# Patient Record
Sex: Male | Born: 1945
Health system: Southern US, Community
[De-identification: ages and names within clinical notes are randomized; demographics above are authoritative.]

## PROBLEM LIST (undated history)

## (undated) DIAGNOSIS — Z87891 Personal history of nicotine dependence: Secondary | ICD-10-CM

## (undated) DIAGNOSIS — H269 Unspecified cataract: Secondary | ICD-10-CM

## (undated) DIAGNOSIS — C9110 Chronic lymphocytic leukemia of B-cell type not having achieved remission: Secondary | ICD-10-CM

## (undated) DIAGNOSIS — I499 Cardiac arrhythmia, unspecified: Secondary | ICD-10-CM

## (undated) DIAGNOSIS — G4730 Sleep apnea, unspecified: Secondary | ICD-10-CM

## (undated) DIAGNOSIS — I471 Supraventricular tachycardia: Secondary | ICD-10-CM

## (undated) DIAGNOSIS — I319 Disease of pericardium, unspecified: Secondary | ICD-10-CM

## (undated) DIAGNOSIS — I2510 Atherosclerotic heart disease of native coronary artery without angina pectoris: Secondary | ICD-10-CM

## (undated) DIAGNOSIS — H409 Unspecified glaucoma: Secondary | ICD-10-CM

## (undated) DIAGNOSIS — I4719 Other supraventricular tachycardia: Secondary | ICD-10-CM

## (undated) DIAGNOSIS — R059 Cough, unspecified: Secondary | ICD-10-CM

## (undated) DIAGNOSIS — R05 Cough: Secondary | ICD-10-CM

## (undated) DIAGNOSIS — I255 Ischemic cardiomyopathy: Secondary | ICD-10-CM

## (undated) DIAGNOSIS — I10 Essential (primary) hypertension: Secondary | ICD-10-CM

## (undated) DIAGNOSIS — I219 Acute myocardial infarction, unspecified: Secondary | ICD-10-CM

## (undated) DIAGNOSIS — E7800 Pure hypercholesterolemia, unspecified: Secondary | ICD-10-CM

## (undated) DIAGNOSIS — I5022 Chronic systolic (congestive) heart failure: Secondary | ICD-10-CM

## (undated) DIAGNOSIS — R0600 Dyspnea, unspecified: Secondary | ICD-10-CM

## (undated) HISTORY — DX: Essential (primary) hypertension: I10

## (undated) HISTORY — DX: Chronic lymphocytic leukemia of B-cell type not having achieved remission: C91.10

## (undated) HISTORY — PX: CATARACT EXTRACTION: SUR2

## (undated) HISTORY — DX: Pure hypercholesterolemia, unspecified: E78.00

## (undated) HISTORY — PX: US ECHOCARDIOGRAPHY: HXRAD669

## (undated) HISTORY — DX: Cough: R05

## (undated) HISTORY — DX: Cough, unspecified: R05.9

## (undated) HISTORY — DX: Sleep apnea, unspecified: G47.30

---

## 1989-01-21 DIAGNOSIS — H409 Unspecified glaucoma: Secondary | ICD-10-CM

## 1989-01-21 HISTORY — DX: Unspecified glaucoma: H40.9

## 1992-01-22 HISTORY — PX: CORONARY ARTERY BYPASS GRAFT: SHX141

## 2002-02-18 ENCOUNTER — Encounter: Payer: Self-pay | Admitting: Family Medicine

## 2002-02-18 ENCOUNTER — Ambulatory Visit (HOSPITAL_COMMUNITY): Admission: RE | Admit: 2002-02-18 | Discharge: 2002-02-18 | Payer: Self-pay | Admitting: Family Medicine

## 2003-10-26 ENCOUNTER — Encounter: Admission: RE | Admit: 2003-10-26 | Discharge: 2003-10-26 | Payer: Self-pay | Admitting: General Surgery

## 2004-02-10 ENCOUNTER — Ambulatory Visit (HOSPITAL_COMMUNITY): Admission: RE | Admit: 2004-02-10 | Discharge: 2004-02-10 | Payer: Self-pay | Admitting: Gastroenterology

## 2009-03-08 ENCOUNTER — Emergency Department (HOSPITAL_BASED_OUTPATIENT_CLINIC_OR_DEPARTMENT_OTHER): Admission: EM | Admit: 2009-03-08 | Discharge: 2009-03-08 | Payer: Self-pay | Admitting: Emergency Medicine

## 2009-07-31 ENCOUNTER — Other Ambulatory Visit: Admission: RE | Admit: 2009-07-31 | Discharge: 2009-07-31 | Payer: Self-pay | Admitting: Oncology

## 2009-07-31 ENCOUNTER — Ambulatory Visit: Payer: Self-pay | Admitting: Oncology

## 2009-07-31 LAB — CBC WITH DIFFERENTIAL/PLATELET
BASO%: 0.3 % (ref 0.0–2.0)
Basophils Absolute: 0 10*3/uL (ref 0.0–0.1)
EOS%: 0.9 % (ref 0.0–7.0)
Eosinophils Absolute: 0.1 10*3/uL (ref 0.0–0.5)
HCT: 44.5 % (ref 38.4–49.9)
HGB: 14.6 g/dL (ref 13.0–17.1)
LYMPH%: 62.2 % — ABNORMAL HIGH (ref 14.0–49.0)
MCH: 28.3 pg (ref 27.2–33.4)
MCHC: 32.8 g/dL (ref 32.0–36.0)
MCV: 86.5 fL (ref 79.3–98.0)
MONO#: 0.8 10*3/uL (ref 0.1–0.9)
MONO%: 4.6 % (ref 0.0–14.0)
NEUT#: 5.4 10*3/uL (ref 1.5–6.5)
NEUT%: 32 % — ABNORMAL LOW (ref 39.0–75.0)
Platelets: 130 10*3/uL — ABNORMAL LOW (ref 140–400)
RBC: 5.15 10*6/uL (ref 4.20–5.82)
RDW: 14.7 % — ABNORMAL HIGH (ref 11.0–14.6)
WBC: 16.9 10*3/uL — ABNORMAL HIGH (ref 4.0–10.3)
lymph#: 10.5 10*3/uL — ABNORMAL HIGH (ref 0.9–3.3)

## 2009-07-31 LAB — MORPHOLOGY: PLT EST: DECREASED

## 2009-08-01 LAB — COMPREHENSIVE METABOLIC PANEL
ALT: 46 U/L (ref 0–53)
AST: 32 U/L (ref 0–37)
Albumin: 4.5 g/dL (ref 3.5–5.2)
Alkaline Phosphatase: 42 U/L (ref 39–117)
BUN: 12 mg/dL (ref 6–23)
CO2: 25 mEq/L (ref 19–32)
Calcium: 9.2 mg/dL (ref 8.4–10.5)
Chloride: 106 mEq/L (ref 96–112)
Creatinine, Ser: 0.84 mg/dL (ref 0.40–1.50)
Glucose, Bld: 105 mg/dL — ABNORMAL HIGH (ref 70–99)
Potassium: 4.5 mEq/L (ref 3.5–5.3)
Sodium: 141 mEq/L (ref 135–145)
Total Bilirubin: 0.8 mg/dL (ref 0.3–1.2)
Total Protein: 6.6 g/dL (ref 6.0–8.3)

## 2009-08-01 LAB — DIRECT ANTIGLOBULIN TEST (NOT AT ARMC)
DAT (Complement): NEGATIVE
DAT IgG: NEGATIVE

## 2009-08-01 LAB — IGG, IGA, IGM
IgA: 102 mg/dL (ref 68–378)
IgG (Immunoglobin G), Serum: 713 mg/dL (ref 694–1618)
IgM, Serum: 50 mg/dL — ABNORMAL LOW (ref 60–263)

## 2009-08-01 LAB — LACTATE DEHYDROGENASE: LDH: 172 U/L (ref 94–250)

## 2009-08-02 LAB — FLOW CYTOMETRY

## 2009-08-02 LAB — ZAP-70

## 2009-08-16 ENCOUNTER — Ambulatory Visit (HOSPITAL_COMMUNITY): Admission: RE | Admit: 2009-08-16 | Discharge: 2009-08-16 | Payer: Self-pay | Admitting: Oncology

## 2009-09-05 ENCOUNTER — Ambulatory Visit: Payer: Self-pay | Admitting: Cardiology

## 2009-09-07 ENCOUNTER — Ambulatory Visit: Payer: Self-pay | Admitting: Cardiology

## 2009-09-07 ENCOUNTER — Ambulatory Visit (HOSPITAL_COMMUNITY): Admission: RE | Admit: 2009-09-07 | Discharge: 2009-09-07 | Payer: Self-pay | Admitting: Cardiology

## 2009-09-08 ENCOUNTER — Ambulatory Visit: Payer: Self-pay | Admitting: Oncology

## 2009-09-11 LAB — CBC WITH DIFFERENTIAL/PLATELET
BASO%: 0.4 % (ref 0.0–2.0)
Basophils Absolute: 0.1 10*3/uL (ref 0.0–0.1)
EOS%: 0.8 % (ref 0.0–7.0)
Eosinophils Absolute: 0.1 10*3/uL (ref 0.0–0.5)
HCT: 44.7 % (ref 38.4–49.9)
HGB: 14.9 g/dL (ref 13.0–17.1)
LYMPH%: 72.8 % — ABNORMAL HIGH (ref 14.0–49.0)
MCH: 28.7 pg (ref 27.2–33.4)
MCHC: 33.4 g/dL (ref 32.0–36.0)
MCV: 86 fL (ref 79.3–98.0)
MONO#: 0.7 10*3/uL (ref 0.1–0.9)
MONO%: 3.8 % (ref 0.0–14.0)
NEUT#: 4.2 10*3/uL (ref 1.5–6.5)
NEUT%: 22.2 % — ABNORMAL LOW (ref 39.0–75.0)
Platelets: 96 10*3/uL — ABNORMAL LOW (ref 140–400)
RBC: 5.19 10*6/uL (ref 4.20–5.82)
RDW: 14.4 % (ref 11.0–14.6)
WBC: 18.8 10*3/uL — ABNORMAL HIGH (ref 4.0–10.3)
lymph#: 13.7 10*3/uL — ABNORMAL HIGH (ref 0.9–3.3)

## 2009-10-05 ENCOUNTER — Ambulatory Visit: Payer: Self-pay | Admitting: Cardiology

## 2009-10-05 ENCOUNTER — Encounter: Admission: RE | Admit: 2009-10-05 | Discharge: 2009-10-05 | Payer: Self-pay | Admitting: Cardiology

## 2009-10-18 ENCOUNTER — Ambulatory Visit: Payer: Self-pay | Admitting: Cardiology

## 2009-10-19 ENCOUNTER — Ambulatory Visit (HOSPITAL_COMMUNITY)
Admission: RE | Admit: 2009-10-19 | Discharge: 2009-10-19 | Payer: Self-pay | Source: Home / Self Care | Admitting: Cardiology

## 2009-11-02 ENCOUNTER — Ambulatory Visit: Payer: Self-pay | Admitting: Cardiology

## 2009-11-16 ENCOUNTER — Ambulatory Visit: Payer: Self-pay | Admitting: Oncology

## 2009-11-20 LAB — CBC WITH DIFFERENTIAL/PLATELET
BASO%: 0.1 % (ref 0.0–2.0)
Basophils Absolute: 0 10*3/uL (ref 0.0–0.1)
EOS%: 0.7 % (ref 0.0–7.0)
Eosinophils Absolute: 0.1 10*3/uL (ref 0.0–0.5)
HCT: 45.2 % (ref 38.4–49.9)
HGB: 14.5 g/dL (ref 13.0–17.1)
LYMPH%: 73.5 % — ABNORMAL HIGH (ref 14.0–49.0)
MCH: 28.3 pg (ref 27.2–33.4)
MCHC: 32.1 g/dL (ref 32.0–36.0)
MCV: 88.1 fL (ref 79.3–98.0)
MONO#: 0.8 10*3/uL (ref 0.1–0.9)
MONO%: 4.3 % (ref 0.0–14.0)
NEUT#: 4 10*3/uL (ref 1.5–6.5)
NEUT%: 21.4 % — ABNORMAL LOW (ref 39.0–75.0)
Platelets: 104 10*3/uL — ABNORMAL LOW (ref 140–400)
RBC: 5.13 10*6/uL (ref 4.20–5.82)
RDW: 13.6 % (ref 11.0–14.6)
WBC: 18.7 10*3/uL — ABNORMAL HIGH (ref 4.0–10.3)
lymph#: 13.8 10*3/uL — ABNORMAL HIGH (ref 0.9–3.3)

## 2009-11-20 LAB — TECHNOLOGIST REVIEW

## 2009-11-21 LAB — COMPREHENSIVE METABOLIC PANEL
ALT: 38 U/L (ref 0–53)
AST: 30 U/L (ref 0–37)
Albumin: 4.1 g/dL (ref 3.5–5.2)
Alkaline Phosphatase: 43 U/L (ref 39–117)
BUN: 14 mg/dL (ref 6–23)
CO2: 26 mEq/L (ref 19–32)
Calcium: 8.9 mg/dL (ref 8.4–10.5)
Chloride: 107 mEq/L (ref 96–112)
Creatinine, Ser: 0.81 mg/dL (ref 0.40–1.50)
Glucose, Bld: 169 mg/dL — ABNORMAL HIGH (ref 70–99)
Potassium: 4.3 mEq/L (ref 3.5–5.3)
Sodium: 140 mEq/L (ref 135–145)
Total Bilirubin: 0.7 mg/dL (ref 0.3–1.2)
Total Protein: 5.9 g/dL — ABNORMAL LOW (ref 6.0–8.3)

## 2009-11-21 LAB — IGG, IGA, IGM
IgA: 100 mg/dL (ref 68–378)
IgG (Immunoglobin G), Serum: 771 mg/dL (ref 694–1618)
IgM, Serum: 39 mg/dL — ABNORMAL LOW (ref 60–263)

## 2009-11-21 LAB — DIRECT ANTIGLOBULIN TEST (NOT AT ARMC)
DAT (Complement): NEGATIVE
DAT IgG: NEGATIVE

## 2010-02-06 ENCOUNTER — Ambulatory Visit: Payer: Self-pay | Admitting: Cardiology

## 2010-04-24 ENCOUNTER — Other Ambulatory Visit: Payer: Self-pay | Admitting: Oncology

## 2010-04-24 ENCOUNTER — Encounter (HOSPITAL_BASED_OUTPATIENT_CLINIC_OR_DEPARTMENT_OTHER): Payer: Medicare Other | Admitting: Oncology

## 2010-04-24 DIAGNOSIS — C9110 Chronic lymphocytic leukemia of B-cell type not having achieved remission: Secondary | ICD-10-CM

## 2010-04-24 DIAGNOSIS — D696 Thrombocytopenia, unspecified: Secondary | ICD-10-CM

## 2010-04-24 DIAGNOSIS — N390 Urinary tract infection, site not specified: Secondary | ICD-10-CM

## 2010-04-24 DIAGNOSIS — Z79899 Other long term (current) drug therapy: Secondary | ICD-10-CM

## 2010-04-24 LAB — CBC WITH DIFFERENTIAL/PLATELET
BASO%: 0.2 % (ref 0.0–2.0)
Basophils Absolute: 0 10*3/uL (ref 0.0–0.1)
EOS%: 0.8 % (ref 0.0–7.0)
Eosinophils Absolute: 0.1 10*3/uL (ref 0.0–0.5)
HCT: 42.4 % (ref 38.4–49.9)
HGB: 14 g/dL (ref 13.0–17.1)
LYMPH%: 76.9 % — ABNORMAL HIGH (ref 14.0–49.0)
MCH: 28.5 pg (ref 27.2–33.4)
MCHC: 32.9 g/dL (ref 32.0–36.0)
MCV: 86.5 fL (ref 79.3–98.0)
MONO#: 0.6 10*3/uL (ref 0.1–0.9)
MONO%: 3.5 % (ref 0.0–14.0)
NEUT#: 2.9 10*3/uL (ref 1.5–6.5)
NEUT%: 18.6 % — ABNORMAL LOW (ref 39.0–75.0)
Platelets: 120 10*3/uL — ABNORMAL LOW (ref 140–400)
RBC: 4.9 10*6/uL (ref 4.20–5.82)
RDW: 14.1 % (ref 11.0–14.6)
WBC: 15.9 10*3/uL — ABNORMAL HIGH (ref 4.0–10.3)
lymph#: 12.2 10*3/uL — ABNORMAL HIGH (ref 0.9–3.3)

## 2010-04-24 LAB — COMPREHENSIVE METABOLIC PANEL
ALT: 54 U/L — ABNORMAL HIGH (ref 0–53)
AST: 38 U/L — ABNORMAL HIGH (ref 0–37)
Albumin: 4.2 g/dL (ref 3.5–5.2)
Alkaline Phosphatase: 42 U/L (ref 39–117)
BUN: 14 mg/dL (ref 6–23)
CO2: 27 mEq/L (ref 19–32)
Calcium: 9.4 mg/dL (ref 8.4–10.5)
Chloride: 107 mEq/L (ref 96–112)
Creatinine, Ser: 0.87 mg/dL (ref 0.40–1.50)
Glucose, Bld: 103 mg/dL — ABNORMAL HIGH (ref 70–99)
Potassium: 4.2 mEq/L (ref 3.5–5.3)
Sodium: 142 mEq/L (ref 135–145)
Total Bilirubin: 0.9 mg/dL (ref 0.3–1.2)
Total Protein: 6.7 g/dL (ref 6.0–8.3)

## 2010-04-24 LAB — URINALYSIS, MICROSCOPIC - CHCC
Bilirubin (Urine): NEGATIVE
Blood: NEGATIVE
Glucose: NEGATIVE g/dL
Ketones: NEGATIVE mg/dL
Leukocyte Esterase: NEGATIVE
Nitrite: NEGATIVE
Protein: NEGATIVE mg/dL
RBC count: NEGATIVE (ref 0–2)
Specific Gravity, Urine: 1.015 (ref 1.003–1.035)
pH: 6 (ref 4.6–8.0)

## 2010-04-24 LAB — URIC ACID: Uric Acid, Serum: 6.8 mg/dL (ref 4.0–7.8)

## 2010-04-26 LAB — URINE CULTURE

## 2010-05-02 ENCOUNTER — Other Ambulatory Visit: Payer: Self-pay | Admitting: Cardiology

## 2010-05-02 ENCOUNTER — Other Ambulatory Visit: Payer: Self-pay | Admitting: *Deleted

## 2010-05-02 DIAGNOSIS — I10 Essential (primary) hypertension: Secondary | ICD-10-CM

## 2010-05-02 MED ORDER — LOSARTAN POTASSIUM 50 MG PO TABS: 50.0000 mg | ORAL_TABLET | Freq: Every day | ORAL | Status: DC

## 2010-06-08 NOTE — Op Note (Signed)
NAME:  Rodney Burns, Rodney Burns NO.:  [id number]   MEDICAL RECORD NO.:  [id number]          PATIENT TYPE:  AMB   LOCATION:  ENDO                         FACILITY:  [facility]   PHYSICIAN:  Graylin Shiver, M.D.   DATE OF BIRTH:  [birth year]   DATE OF PROCEDURE:  02/10/2004  DATE OF DISCHARGE:                                 OPERATIVE REPORT   .   INDICATIONS:  Screening.   Informed consent was obtained after explanation of the risks of bleeding,  infection and perforation.   PREMEDICATION:  Fentanyl 60 mcg IV, Versed 8 mg IV.   PROCEDURE:  With the patient in the left lateral decubitus position, a  rectal exam was performed. No masses were felt. The Olympus colonoscope was  inserted into the rectum and advanced around the colon to the cecum. Cecal  landmarks were identified.  The cecum and ascending colon were normal. The  transverse colon normal. The descending colon, sigmoid and rectum were  normal.  He tolerated the procedure well without complications.   IMPRESSION:  Normal colonoscopy to the cecum.   I would recommend a follow-up colonoscopy again in 10 years.       SFG/MEDQ  D:  02/10/2004  T:  02/10/2004  Job:  [id number]   cc:   Caryn Bee L. Little, M.D.  [address]  [place]  [place] [address]  Fax: [phone]

## 2010-06-23 ENCOUNTER — Other Ambulatory Visit: Payer: Self-pay | Admitting: Cardiology

## 2010-06-25 NOTE — Telephone Encounter (Signed)
escribe medication per fax request  

## 2010-07-03 ENCOUNTER — Other Ambulatory Visit: Payer: Self-pay | Admitting: Oncology

## 2010-07-03 ENCOUNTER — Encounter (HOSPITAL_BASED_OUTPATIENT_CLINIC_OR_DEPARTMENT_OTHER): Payer: Medicare Other | Admitting: Oncology

## 2010-07-03 DIAGNOSIS — C9110 Chronic lymphocytic leukemia of B-cell type not having achieved remission: Secondary | ICD-10-CM

## 2010-07-03 LAB — CBC WITH DIFFERENTIAL/PLATELET
BASO%: 0.3 % (ref 0.0–2.0)
Basophils Absolute: 0 10*3/uL (ref 0.0–0.1)
EOS%: 0.8 % (ref 0.0–7.0)
Eosinophils Absolute: 0.1 10*3/uL (ref 0.0–0.5)
HCT: 42.2 % (ref 38.4–49.9)
HGB: 13.7 g/dL (ref 13.0–17.1)
LYMPH%: 76 % — ABNORMAL HIGH (ref 14.0–49.0)
MCH: 28.3 pg (ref 27.2–33.4)
MCHC: 32.5 g/dL (ref 32.0–36.0)
MCV: 87.1 fL (ref 79.3–98.0)
MONO#: 0.4 10*3/uL (ref 0.1–0.9)
MONO%: 2.5 % (ref 0.0–14.0)
NEUT#: 3.5 10*3/uL (ref 1.5–6.5)
NEUT%: 20.4 % — ABNORMAL LOW (ref 39.0–75.0)
Platelets: 100 10*3/uL — ABNORMAL LOW (ref 140–400)
RBC: 4.85 10*6/uL (ref 4.20–5.82)
RDW: 14.1 % (ref 11.0–14.6)
WBC: 17.4 10*3/uL — ABNORMAL HIGH (ref 4.0–10.3)
lymph#: 13.2 10*3/uL — ABNORMAL HIGH (ref 0.9–3.3)

## 2010-07-03 LAB — COMPREHENSIVE METABOLIC PANEL
ALT: 39 U/L (ref 0–53)
AST: 26 U/L (ref 0–37)
Albumin: 4.4 g/dL (ref 3.5–5.2)
Alkaline Phosphatase: 42 U/L (ref 39–117)
BUN: 12 mg/dL (ref 6–23)
CO2: 23 mEq/L (ref 19–32)
Calcium: 8.9 mg/dL (ref 8.4–10.5)
Chloride: 106 mEq/L (ref 96–112)
Creatinine, Ser: 0.9 mg/dL (ref 0.50–1.35)
Glucose, Bld: 174 mg/dL — ABNORMAL HIGH (ref 70–99)
Potassium: 4.3 mEq/L (ref 3.5–5.3)
Sodium: 139 mEq/L (ref 135–145)
Total Bilirubin: 0.6 mg/dL (ref 0.3–1.2)
Total Protein: 6 g/dL (ref 6.0–8.3)

## 2010-07-03 LAB — TECHNOLOGIST REVIEW

## 2010-08-16 ENCOUNTER — Encounter: Payer: Self-pay | Admitting: Cardiology

## 2010-08-17 ENCOUNTER — Ambulatory Visit (INDEPENDENT_AMBULATORY_CARE_PROVIDER_SITE_OTHER): Payer: Medicare Other | Admitting: Cardiology

## 2010-08-17 ENCOUNTER — Encounter: Payer: Self-pay | Admitting: Cardiology

## 2010-08-17 DIAGNOSIS — I4891 Unspecified atrial fibrillation: Secondary | ICD-10-CM

## 2010-08-17 DIAGNOSIS — I4892 Unspecified atrial flutter: Secondary | ICD-10-CM

## 2010-08-17 DIAGNOSIS — M129 Arthropathy, unspecified: Secondary | ICD-10-CM

## 2010-08-17 DIAGNOSIS — M1990 Unspecified osteoarthritis, unspecified site: Secondary | ICD-10-CM

## 2010-08-17 DIAGNOSIS — I10 Essential (primary) hypertension: Secondary | ICD-10-CM | POA: Insufficient documentation

## 2010-08-17 DIAGNOSIS — I219 Acute myocardial infarction, unspecified: Secondary | ICD-10-CM

## 2010-08-17 DIAGNOSIS — E785 Hyperlipidemia, unspecified: Secondary | ICD-10-CM | POA: Insufficient documentation

## 2010-08-17 DIAGNOSIS — I2510 Atherosclerotic heart disease of native coronary artery without angina pectoris: Secondary | ICD-10-CM

## 2010-08-17 DIAGNOSIS — C9110 Chronic lymphocytic leukemia of B-cell type not having achieved remission: Secondary | ICD-10-CM

## 2010-08-17 DIAGNOSIS — E7800 Pure hypercholesterolemia, unspecified: Secondary | ICD-10-CM

## 2010-08-17 MED ORDER — TRAMADOL HCL 50 MG PO TABS: 50.0000 mg | ORAL_TABLET | Freq: Four times a day (QID) | ORAL | Status: DC | PRN

## 2010-08-17 NOTE — Assessment & Plan Note (Signed)
He remains on Vytorin and niacin. We will follow fasting lab work with his next visit in 6 months.

## 2010-08-17 NOTE — Assessment & Plan Note (Signed)
He remains asymptomatic. His last stress test in June of 2009 showed no ischemia at a good exercise level. We will continue with his current medical therapy.

## 2010-08-17 NOTE — Progress Notes (Signed)
Rodney Burns Date of Birth: [birth year]   History of Present Illness: Rodney Burns is seen for followup today. He has done very well since his last visit without any symptoms of chest pain, palpitations, shortness of breath. He has actually lost 7 pounds. He notes that his platelet count was a little lower on his recent oncology visit it was recommended that he stop taking the Aleve. He has had no bleeding difficulties.  Current Outpatient Prescriptions on File Prior to Visit  Medication Sig Dispense Refill  . aspirin 81 MG tablet Take 81 mg by mouth daily.        [name] latanoprost (XALATAN) 0.005 % ophthalmic solution 1 drop at bedtime.        [name] losartan (COZAAR) 50 MG tablet Take 1 tablet (50 mg total) by mouth daily.  30 tablet  11  . metoprolol succinate (TOPROL-XL) 25 MG 24 hr tablet Take 25 mg by mouth daily.        [name] NIASPAN 750 MG CR tablet TAKE 2 TABLETS EVERY EVENING  180 tablet  4  . RABEprazole (ACIPHEX) 20 MG tablet Take 20 mg by mouth daily.        [name] VYTORIN 10-20 MG per tablet TAKE 1 TABLET EVERY EVENING  90 tablet  4  . DISCONTD: Acetaminophen (TYLENOL PO) Take by mouth as needed.          Allergies  Allergen Reactions  . Ace Inhibitors     Cause a Cough  . Darvocet (Propoxyphene N-Acetaminophen)   . Penicillins     Past Medical History  Diagnosis Date  . Atrial fibrillation     Status post successful DC cardioversion in September of 2011  . MI (myocardial infarction)     Remote anterior  . Coronary artery disease     Status post CABG in 1994  . CLL (chronic lymphoblastic leukemia)     Stage 0-1  . Hypercholesterolemia     Well Controlled  . Hypertension   . Cough     Consistant with ACE inhibitor mediated cough  . Malaria 1972    Hx of    Past Surgical History  Procedure Date  . Coronary artery bypass graft 1994    By Dr. Laneta Simmers. This included an LIMA graft to the left circumflex coronary, a sequential saphenous vein graft to the diagonal and the LAD, and a vein  graft to the posterior descending and posterior lateral branches of the coronary artery.  . US echocardiography 09-07-09    Est EF 40-45%    History  Smoking status  . Former Smoker  . Quit date: 01/22/1992  Smokeless tobacco  . Not on file    History  Alcohol Use     History reviewed. No pertinent family history.  Review of Systems: As noted in history of present illness.  All other systems were reviewed and are negative.  Physical Exam: BP 122/80  Pulse 58  Ht 5\' 11"  (1.803 m)  Wt 225 lb (102.059 kg)  BMI 31.38 kg/m2 He is a pleasant white male in no acute distress. He is normocephalic, atraumatic. Pupils are equal round and reactive to light accommodation. Extraocular movements are full. Oropharynx is clear. Neck is supple without JVD, adenopathy, or megaly, or bruits. Lungs are clear. Cardiac exam reveals a regular rate and rhythm without gallop, murmur, or click. Abdomen is obese, soft, nontender. He has good pedal pulses. He has no edema. He is alert and oriented x3. Cranial nerves II through  XII are intact. LABORATORY DATA: ECG demonstrates normal sinus rhythm with rightward axis. Anterior myocardial infarction, old.  Assessment / Plan:

## 2010-08-17 NOTE — Assessment & Plan Note (Signed)
Blood pressure is well controlled on his current regimen 

## 2010-08-17 NOTE — Assessment & Plan Note (Signed)
No recurrence of atrial fibrillation. He is asymptomatic. We will continue with metoprolol and aspirin.

## 2010-08-17 NOTE — Assessment & Plan Note (Signed)
He had a remote anterior myocardial infarction. Echocardiogram in August of 2011 demonstrated an ejection fraction of 40-45% with moderate to severe hypokinesis of the anteroseptal wall. He is on appropriate therapy with metoprolol and ARB.

## 2010-08-17 NOTE — Patient Instructions (Signed)
Continue your current medications.  I will see you again in 6 months with fasting lab work.   

## 2010-08-21 ENCOUNTER — Encounter: Payer: Self-pay | Admitting: Cardiology

## 2010-09-04 ENCOUNTER — Other Ambulatory Visit: Payer: Self-pay | Admitting: Cardiology

## 2010-09-05 NOTE — Telephone Encounter (Signed)
escribe medication per fax request  

## 2010-09-10 ENCOUNTER — Other Ambulatory Visit: Payer: Self-pay | Admitting: Oncology

## 2010-09-10 ENCOUNTER — Encounter (HOSPITAL_BASED_OUTPATIENT_CLINIC_OR_DEPARTMENT_OTHER): Payer: Medicare Other | Admitting: Oncology

## 2010-09-10 DIAGNOSIS — C9110 Chronic lymphocytic leukemia of B-cell type not having achieved remission: Secondary | ICD-10-CM

## 2010-09-10 DIAGNOSIS — A5400 Gonococcal infection of lower genitourinary tract, unspecified: Secondary | ICD-10-CM

## 2010-09-10 DIAGNOSIS — D696 Thrombocytopenia, unspecified: Secondary | ICD-10-CM

## 2010-09-10 LAB — CBC WITH DIFFERENTIAL/PLATELET
BASO%: 0.4 % (ref 0.0–2.0)
EOS%: 0.9 % (ref 0.0–7.0)
HCT: 43.9 % (ref 38.4–49.9)
LYMPH%: 75.7 % — ABNORMAL HIGH (ref 14.0–49.0)
MCH: 29.1 pg (ref 27.2–33.4)
MCHC: 33.3 g/dL (ref 32.0–36.0)
MCV: 87.5 fL (ref 79.3–98.0)
MONO%: 1.9 % (ref 0.0–14.0)
NEUT%: 21.1 % — ABNORMAL LOW (ref 39.0–75.0)
Platelets: 104 10*3/uL — ABNORMAL LOW (ref 140–400)
lymph#: 12.4 10*3/uL — ABNORMAL HIGH (ref 0.9–3.3)

## 2010-09-11 LAB — COMPREHENSIVE METABOLIC PANEL
ALT: 35 U/L (ref 0–53)
AST: 25 U/L (ref 0–37)
Alkaline Phosphatase: 41 U/L (ref 39–117)
Potassium: 4.5 mEq/L (ref 3.5–5.3)
Sodium: 140 mEq/L (ref 135–145)
Total Bilirubin: 0.9 mg/dL (ref 0.3–1.2)
Total Protein: 6.2 g/dL (ref 6.0–8.3)

## 2010-09-11 LAB — URIC ACID: Uric Acid, Serum: 6.6 mg/dL (ref 4.0–7.8)

## 2010-09-11 LAB — DIRECT ANTIGLOBULIN TEST (NOT AT ARMC)
DAT (Complement): NEGATIVE
DAT IgG: NEGATIVE

## 2010-09-11 LAB — IGG, IGA, IGM: IgM, Serum: 40 mg/dL — ABNORMAL LOW (ref 41–251)

## 2011-01-22 HISTORY — PX: OTHER SURGICAL HISTORY: SHX169

## 2011-01-29 ENCOUNTER — Other Ambulatory Visit: Payer: Self-pay | Admitting: *Deleted

## 2011-01-29 NOTE — Telephone Encounter (Signed)
09/10/10 DICTATION MENTIONED LEFT SHOULDER ARTHRITIS. PT. WAS GIVEN A PRESCRIPTION FOR TRAMADOL AGAIN. DR. Welton Flakes TO REFER PT. FOR FURTHER EVALUATION BY RHEUMATOLOGY. THE TRAMADOL REFILL REQUEST WAS GIVEN TO DR.KHAN'S NURSE, MARY GARNER,RN. SHE WILL CHECK WITH DR.KHAN.

## 2011-02-09 ENCOUNTER — Telehealth: Payer: Self-pay | Admitting: Oncology

## 2011-02-09 NOTE — Telephone Encounter (Signed)
called pt and scheduled appts for [phone]

## 2011-02-13 ENCOUNTER — Ambulatory Visit (INDEPENDENT_AMBULATORY_CARE_PROVIDER_SITE_OTHER): Payer: Medicare Other | Admitting: Cardiology

## 2011-02-13 ENCOUNTER — Encounter: Payer: Self-pay | Admitting: Cardiology

## 2011-02-13 DIAGNOSIS — R002 Palpitations: Secondary | ICD-10-CM | POA: Diagnosis not present

## 2011-02-13 DIAGNOSIS — I2510 Atherosclerotic heart disease of native coronary artery without angina pectoris: Secondary | ICD-10-CM | POA: Diagnosis not present

## 2011-02-13 DIAGNOSIS — I4892 Unspecified atrial flutter: Secondary | ICD-10-CM | POA: Diagnosis not present

## 2011-02-13 DIAGNOSIS — I10 Essential (primary) hypertension: Secondary | ICD-10-CM

## 2011-02-13 DIAGNOSIS — I4891 Unspecified atrial fibrillation: Secondary | ICD-10-CM | POA: Diagnosis not present

## 2011-02-13 DIAGNOSIS — I480 Paroxysmal atrial fibrillation: Secondary | ICD-10-CM

## 2011-02-13 MED ORDER — DABIGATRAN ETEXILATE MESYLATE 150 MG PO CAPS: 150.0000 mg | ORAL_CAPSULE | Freq: Two times a day (BID) | ORAL | Status: DC

## 2011-02-13 NOTE — Patient Instructions (Addendum)
Stop ASA  Start Pradaxa 150 mg twice a day.  We will schedule you for a stress nuclear test and an echocardiogram.  I will see you again in 3 months.  Your physician wants you to follow-up in: 3 months  You will receive a reminder letter in the mail two months in advance. If you don't receive a letter, please call our office to schedule the follow-up appointment.  Your physician has requested that you have an echocardiogram. Echocardiography is a painless test that uses sound waves to create images of your heart. It provides your doctor with information about the size and shape of your heart and how well your heart's chambers and valves are working. This procedure takes approximately one hour. There are no restrictions for this procedure.  Your physician has requested that you have en exercise stress myoview. For further information please visit [email or web address] Please follow instruction sheet, as given.

## 2011-02-14 ENCOUNTER — Telehealth: Payer: Self-pay | Admitting: Cardiology

## 2011-02-14 NOTE — Telephone Encounter (Signed)
New Problem:     Patient went in to see Dr. [name] yesterday and said that Dr. [name] was under the belief that he had a Cardioversion 4 months ago when really his last Cardioversion was 16 months ago.  He was wondering if this would have any effect on how Dr. [name] would like to treat him since he has an Atrial flutter again.  Please call back.

## 2011-02-14 NOTE — Progress Notes (Signed)
Rodney Burns Date of Birth: October 17, 1945   History of Present Illness: Rodney Burns is seen for followup today. He has done very well since his last visit without any symptoms of chest pain, palpitations, shortness of breath. He is unaware of any arrhythmia. He is status post cardioversion for atrial flutter in September of 2011. On his last followup visit in July his pulse was 58 and regular.  Current Outpatient Prescriptions on File Prior to Visit  Medication Sig Dispense Refill  . acetaminophen (TYLENOL ARTHRITIS PAIN) 650 MG CR tablet Take 650 mg by mouth 2 (two) times daily.        [name] latanoprost (XALATAN) 0.005 % ophthalmic solution 1 drop at bedtime.        [name] losartan (COZAAR) 50 MG tablet Take 1 tablet (50 mg total) by mouth daily.  30 tablet  11  . metoprolol succinate (TOPROL-XL) 25 MG 24 hr tablet Take 25 mg by mouth daily.        [name] NIASPAN 750 MG CR tablet TAKE 2 TABLETS EVERY EVENING  180 tablet  4  . RABEprazole (ACIPHEX) 20 MG tablet Take 20 mg by mouth daily.        . traMADol (ULTRAM) 50 MG tablet TAKE 1 TABLET (50 MG TOTAL) BY MOUTH EVERY 6 (SIX) HOURS AS NEEDED FOR PAIN.  30 tablet  5  . VYTORIN 10-20 MG per tablet TAKE 1 TABLET EVERY EVENING  90 tablet  4    Allergies  Allergen Reactions  . Ace Inhibitors     Cause a Cough  . Darvocet (Propoxyphene N-Acetaminophen)   . Penicillins     Past Medical History  Diagnosis Date  . Atrial fibrillation     Status post successful DC cardioversion in September of 2011  . MI (myocardial infarction)     Remote anterior  . Coronary artery disease     Status post CABG in 1994  . CLL (chronic lymphoblastic leukemia)     Stage 0-1  . Hypercholesterolemia     Well Controlled  . Hypertension   . Cough     Consistant with ACE inhibitor mediated cough  . Malaria 1972    Hx of    Past Surgical History  Procedure Date  . Coronary artery bypass graft 1994    By Dr. Laneta Simmers. This included an LIMA graft to the left circumflex  coronary, a sequential saphenous vein graft to the diagonal and the LAD, and a vein graft to the posterior descending and posterior lateral branches of the coronary artery.  . US echocardiography 09-07-09    Est EF 40-45%    History  Smoking status  . Former Smoker  . Quit date: 01/22/1992  Smokeless tobacco  . Not on file    History  Alcohol Use     History reviewed. No pertinent family history.  Review of Systems: As noted in history of present illness.  All other systems were reviewed and are negative.  Physical Exam: BP 106/68  Pulse 62  Ht 5\' 11"  (1.803 m)  Wt 220 lb 6.4 oz (99.973 kg)  BMI 30.74 kg/m2 He is a pleasant white male in no acute distress. He is normocephalic, atraumatic. Pupils are equal round and reactive to light accommodation. Extraocular movements are full. Oropharynx is clear. Neck is supple without JVD, adenopathy, or megaly, or bruits. Lungs are clear. Cardiac exam reveals an irregular rate and rhythm without gallop, murmur, or click. Abdomen is obese, soft, nontender. He has good pedal  pulses. He has no edema. He is alert and oriented x3. Cranial nerves II through XII are intact. LABORATORY DATA: ECG demonstrates atrial flutter with variable AV block and a rate of 80 beats per minute. There is an old anterior infarction.  Assessment / Plan:

## 2011-02-14 NOTE — Assessment & Plan Note (Signed)
Blood pressure is much improved. I suspect some of this is because he is now in atrial flutter.

## 2011-02-14 NOTE — Assessment & Plan Note (Signed)
It has been several years since his last evaluation. We will update stress Myoview. We can observe his heart rate response with exercise.

## 2011-02-14 NOTE — Telephone Encounter (Signed)
Forwarded to Dr [name].  Pt is aware that Dr [name] is in the hospital today but will be back in the office tomorrow.  He wants to know if Dr [name] will need to see him sooner because of the cardioversion being longer ago than they originally thought.

## 2011-02-14 NOTE — Assessment & Plan Note (Addendum)
He has recurrent atrial flutter. His rate is well controlled. He is completely asymptomatic. We discussed potential treatment including antiarrhythmic drugs, ablation, or rate control. Since he is asymptomatic I recommended initial strategy of rate control only. We will continue with metoprolol. Will monitor him closely for any recurrent symptoms. If he does become symptomatic then I think I would recommend ablation as an initial strategy since he does have atrial flutter which would be relatively easy to ablate. We will reinitiate anticoagulation with Pradaxa. He was on this previously and tolerated it well. We will update his echocardiogram. I'll followup again in 3 months. He does have a history of thrombocytopenia related to his leukemia but his platelet count has rarely been less than 100,000. I don't think this is a contraindication to anticoagulation.

## 2011-02-15 NOTE — Telephone Encounter (Signed)
Yes I picked up on this when I reviewed his record. Cardioversion was in 9/11 not 9/12. We will see the results of his myoview and Echo studies and see if this changes our plan. Ezella Kell [name] MD, [facility]

## 2011-02-15 NOTE — Telephone Encounter (Signed)
Pt was notified.  

## 2011-02-21 ENCOUNTER — Ambulatory Visit (HOSPITAL_COMMUNITY): Payer: Medicare Other | Attending: Internal Medicine | Admitting: Radiology

## 2011-02-21 VITALS — BP 127/80 | Ht 71.0 in | Wt 220.0 lb

## 2011-02-21 DIAGNOSIS — Z951 Presence of aortocoronary bypass graft: Secondary | ICD-10-CM | POA: Insufficient documentation

## 2011-02-21 DIAGNOSIS — E7800 Pure hypercholesterolemia, unspecified: Secondary | ICD-10-CM

## 2011-02-21 DIAGNOSIS — Z87891 Personal history of nicotine dependence: Secondary | ICD-10-CM | POA: Insufficient documentation

## 2011-02-21 DIAGNOSIS — I2510 Atherosclerotic heart disease of native coronary artery without angina pectoris: Secondary | ICD-10-CM | POA: Insufficient documentation

## 2011-02-21 DIAGNOSIS — I4892 Unspecified atrial flutter: Secondary | ICD-10-CM

## 2011-02-21 DIAGNOSIS — I4949 Other premature depolarization: Secondary | ICD-10-CM

## 2011-02-21 DIAGNOSIS — I10 Essential (primary) hypertension: Secondary | ICD-10-CM | POA: Insufficient documentation

## 2011-02-21 DIAGNOSIS — E785 Hyperlipidemia, unspecified: Secondary | ICD-10-CM | POA: Diagnosis not present

## 2011-02-21 DIAGNOSIS — Z8249 Family history of ischemic heart disease and other diseases of the circulatory system: Secondary | ICD-10-CM | POA: Diagnosis not present

## 2011-02-21 DIAGNOSIS — I219 Acute myocardial infarction, unspecified: Secondary | ICD-10-CM

## 2011-02-21 MED ORDER — REGADENOSON 0.4 MG/5ML IV SOLN
0.4000 mg | Freq: Once | INTRAVENOUS | Status: AC
  Administered 2011-02-21: 0.4 mg via INTRAVENOUS

## 2011-02-21 MED ORDER — TECHNETIUM TC 99M TETROFOSMIN IV KIT
33.0000 | PACK | Freq: Once | INTRAVENOUS | Status: AC | PRN
  Administered 2011-02-21: 33 via INTRAVENOUS

## 2011-02-21 MED ORDER — TECHNETIUM TC 99M TETROFOSMIN IV KIT
9.8000 | PACK | Freq: Once | INTRAVENOUS | Status: AC | PRN
  Administered 2011-02-21: 10 via INTRAVENOUS

## 2011-02-21 NOTE — Progress Notes (Signed)
[facility] SITE 3 NUCLEAR MED [address] [place] [address] [phone]  Cardiology Nuclear Med Study  Rodney Burns is a 66 y.o. male [id number] 1946/01/16   Nuclear Med Background Indication for Stress Test:  Evaluation for Ischemia and Graft Patency History:  Prior AWMI; '94 CABG; '09 MPS:No ischemia; '11 Echo:EF=40-45%, Moderate to severe hypokinesis of ASW; '11 Cardioversion/Atrial Flutter Cardiac Risk Factors: Family History - CAD, History of Smoking, Hypertension and Lipids  Symptoms:  No cardiac complaints.   Nuclear Pre-Procedure Caffeine/Decaff Intake:  None NPO After: 7:00pm   Lungs:  Clear. IV 0.9% NS with Angio Cath:  20g  IV Site: R Wrist  IV Started by:  Stanton Kidney, EMT-P  Chest Size (in):  46 Cup Size: n/a  Height: 5\' 11"  (1.803 m)  Weight:  220 lb (99.791 kg)  BMI:  Body mass index is 30.68 kg/(m^2). Tech Comments:  Toprol not held per MD    Nuclear Med Study 1 or 2 day study: 1 day  Stress Test Type:  Treadmill/Lexiscan  Reading MD: Charlton Haws, MD  Order Authorizing Provider:  An Lannan [name], MD  Resting Radionuclide: Technetium 41m Tetrofosmin  Resting Radionuclide Dose: 9.8 mCi   Stress Radionuclide:  Technetium 95m Tetrofosmin  Stress Radionuclide Dose: 33.0 mCi           Stress Protocol Rest HR: 78 Stress HR: 121  Rest BP: 127/80 Stress BP: 132/74  Exercise Time (min): 6:46 METS: 6.0   Predicted Max HR: 154 bpm % Max HR: 78.57 bpm Rate Pressure Product: 21308   Dose of Adenosine (mg):  n/a Dose of Lexiscan: 0.4 mg  Dose of Atropine (mg): n/a Dose of Dobutamine: n/a mcg/kg/min (at max HR)  Stress Test Technologist: Smiley Houseman, CMA-N  Nuclear Technologist:  Domenic Polite, CNMT     Rest Procedure:  Myocardial perfusion imaging was performed at rest 45 minutes following the intravenous administration of Technetium 7m Tetrofosmin.  Rest ECG: Prior AWMI, atrial flutter and occasional PVC.  Stress Procedure: The  patient initially walked the treadmill utilizing the Bruce protocol, but was unable to obtain his target heart rate due to (b) hip pain. He was then given IV Lexiscan 0.4 mg over 15-seconds with concurrent low level exercise and then Technetium 73m Tetrofosmin was injected at 30-seconds while the patient continued walking one more minute.  There were no diagnostic ST-T wave changes with Lexiscan, only frequent PVC's.  Quantitative spect images were obtained after a 45-minute delay.  Stress ECG: No significant change from baseline ECG  QPS Raw Data Images:  Normal; no motion artifact; normal heart/lung ratio. Stress Images:  There is decreased uptake in the inferior wall. Rest Images:  There is decreased uptake in the inferior wall. Subtraction (SDS):  There is a fixed defect that is most consistent with a previous infarction. Transient Ischemic Dilatation (Normal <1.22):  1.04 Lung/Heart Ratio (Normal <0.45):  0.38  Quantitative Gated Spect Images QGS EDV:  178 ml QGS ESV:  108 ml QGS cine images:  Inferior wall hypokinesis QGS EF: 39%  Impression Exercise Capacity:  Lexiscan with low level exercise. BP Response:  Normal blood pressure response. Clinical Symptoms:  No chest pain. ECG Impression:  No significant ST segment change suggestive of ischemia. Comparison with Prior Nuclear Study: No images to compare  Overall Impression:  Moderate inferolateral wall infarct from apex to base.  Smaller anteroapical wall infarct  No ischemia EF 39%     Charlton Haws

## 2011-03-04 ENCOUNTER — Other Ambulatory Visit: Payer: Self-pay

## 2011-03-04 ENCOUNTER — Ambulatory Visit (HOSPITAL_COMMUNITY): Payer: Medicare Other | Attending: Cardiology | Admitting: Radiology

## 2011-03-04 DIAGNOSIS — I4892 Unspecified atrial flutter: Secondary | ICD-10-CM | POA: Insufficient documentation

## 2011-03-04 DIAGNOSIS — I4891 Unspecified atrial fibrillation: Secondary | ICD-10-CM

## 2011-03-04 DIAGNOSIS — I2510 Atherosclerotic heart disease of native coronary artery without angina pectoris: Secondary | ICD-10-CM | POA: Diagnosis not present

## 2011-03-04 DIAGNOSIS — I10 Essential (primary) hypertension: Secondary | ICD-10-CM | POA: Diagnosis not present

## 2011-03-05 DIAGNOSIS — K625 Hemorrhage of anus and rectum: Secondary | ICD-10-CM | POA: Diagnosis not present

## 2011-03-05 DIAGNOSIS — C9110 Chronic lymphocytic leukemia of B-cell type not having achieved remission: Secondary | ICD-10-CM | POA: Diagnosis not present

## 2011-03-05 DIAGNOSIS — I4892 Unspecified atrial flutter: Secondary | ICD-10-CM | POA: Diagnosis not present

## 2011-03-05 DIAGNOSIS — I10 Essential (primary) hypertension: Secondary | ICD-10-CM | POA: Diagnosis not present

## 2011-03-07 ENCOUNTER — Telehealth: Payer: Self-pay

## 2011-03-07 NOTE — Telephone Encounter (Signed)
Patient called was told Dr.Jordan advises EP evaluation for a possible A Flutter Ablation.Schedulers will be calling with appointment.

## 2011-03-12 NOTE — Telephone Encounter (Signed)
New problem;  Per [facility] fax request sent on  2/13. Patient having surgery on 2/28. Check ing on status of fax.

## 2011-03-12 NOTE — Telephone Encounter (Signed)
Per Carollee Herter from GI, a request for clearance  was faxed on 03/06/11  For pt to have a Colonoscopy on 03/21/11 at 3:30 Pm. She would like to have it singed by MD  and fax back to her soon.

## 2011-03-12 NOTE — Telephone Encounter (Signed)
Left a message to call back.

## 2011-03-12 NOTE — Telephone Encounter (Signed)
Patient has an appointment with Dr. Graciela Husbands 03/29/11 @ 3:30. Left message on patient VM.

## 2011-03-14 NOTE — Telephone Encounter (Signed)
Form faxed back to [facility] at [facility]  [phone].Advising to stop pradaxa 48 hrs prior to procedure.

## 2011-03-21 ENCOUNTER — Other Ambulatory Visit: Payer: Self-pay | Admitting: Gastroenterology

## 2011-03-21 DIAGNOSIS — Z1211 Encounter for screening for malignant neoplasm of colon: Secondary | ICD-10-CM | POA: Diagnosis not present

## 2011-03-21 DIAGNOSIS — K620 Anal polyp: Secondary | ICD-10-CM | POA: Diagnosis not present

## 2011-03-21 DIAGNOSIS — Z800 Family history of malignant neoplasm of digestive organs: Secondary | ICD-10-CM | POA: Diagnosis not present

## 2011-03-21 DIAGNOSIS — D126 Benign neoplasm of colon, unspecified: Secondary | ICD-10-CM | POA: Diagnosis not present

## 2011-03-21 DIAGNOSIS — K621 Rectal polyp: Secondary | ICD-10-CM | POA: Diagnosis not present

## 2011-03-22 ENCOUNTER — Other Ambulatory Visit: Payer: Self-pay | Admitting: Cardiology

## 2011-03-22 MED ORDER — METOPROLOL SUCCINATE ER 25 MG PO TB24: 25.0000 mg | ORAL_TABLET | Freq: Every day | ORAL | Status: DC

## 2011-03-29 ENCOUNTER — Encounter: Payer: Self-pay | Admitting: Internal Medicine

## 2011-03-29 ENCOUNTER — Ambulatory Visit (INDEPENDENT_AMBULATORY_CARE_PROVIDER_SITE_OTHER): Payer: Medicare Other | Admitting: Internal Medicine

## 2011-03-29 DIAGNOSIS — I4892 Unspecified atrial flutter: Secondary | ICD-10-CM | POA: Diagnosis not present

## 2011-03-29 DIAGNOSIS — R55 Syncope and collapse: Secondary | ICD-10-CM | POA: Diagnosis not present

## 2011-03-29 DIAGNOSIS — I255 Ischemic cardiomyopathy: Secondary | ICD-10-CM

## 2011-03-29 DIAGNOSIS — I2589 Other forms of chronic ischemic heart disease: Secondary | ICD-10-CM

## 2011-03-29 NOTE — Assessment & Plan Note (Signed)
The patient has recurrent atrial flutter with 2 different morphologies. Neither one of them is entirely diagnostic of isthmus-dependent flutter; we will review the operative note to see whether there is any reason that a different atriotomy scar than normal but had been applied. In the event that this is not the case, we'll undertake electrophysiological testing to look for the cause of his atrial flutter and ablation.  We have discussed potential risks and benefits include but not limited to AV junction ablation and bleeding. We've also discussed the potential of atrial fibrillation evolving subsequently in the ongoing need than for anticoagulation

## 2011-03-29 NOTE — Assessment & Plan Note (Signed)
The patient has ischemic cardio myopathy with an ejection fraction of 40% and inferior wall motion abnormality and scar. Given his history of syncope, the concern is ventricular tachycardia. Not withstanding the association of this episode with getting out of a car, the abrupt resolution of the symptoms and the scar with his left ventricular dysfunction raised the concern of ventricular tachycardia. Electro- physiological testing would be useful in trying to identify whether his substrate is sufficiently abnormal to sustain ventricular tachycardia

## 2011-03-29 NOTE — Progress Notes (Signed)
 History and Physical  Patient ID: Rodney Burns MRN: [id number], SOB: 03/29/1945 65 y.o. Date of Encounter: 03/29/2011, 5:28 PM  Primary Physician: LITTLE,KEVIN LORNE, MD, MD Primary Cardiologist: PJ Primary Electrophysiologist:  SK  Chief Complaint: flutter  History of Present Illness: Rodney Burns is a 65 y.o. male  referred for consideration of atrial flutter ablation. He was initially identified as having atrial flutter September 2011. This is erroneous the described in the medical record as atrial fibrillation. It was atypical. Cardioversion was successful.  He was then found earlier this year to be in atrial flutter again with a different flutter morphology. This one also is somewhat atypical but is close to typical. He is without symptoms associated with this.  He was started on Pradaxa and has been on for about a month;  he has had some problems with GI intolerance of this drug.  Thromboembolic risk profile is notable for hypertension-1age-1 vascular disease-1 and left ventricular dysfunction-1 for CHADS-VASc score of 4.  About a year ago he had an episode of syncope. He got out of the car and collapsed to the ground. By the time his wife that the other side of the car he was standing up. He does not have a history of orthostatic lightheadedness. There has been no other syncope.  His past medical history is also notable for CLL.     Past Medical History  Diagnosis Date  . Atrial fibrillation     Status post successful DC cardioversion in September of 2011  . MI (myocardial infarction)     Remote anterior  . Coronary artery disease     Status post CABG in 1994  . CLL (chronic lymphoblastic leukemia)     Stage 0-1  . Hypercholesterolemia     Well Controlled  . Hypertension   . Cough     Consistant with ACE inhibitor mediated cough  . Malaria 1972    Hx of     Past Surgical History  Procedure Date  . Coronary artery bypass graft 1994    By Dr. Bartle. This  included an LIMA graft to the left circumflex coronary, a sequential saphenous vein graft to the diagonal and the LAD, and a vein graft to the posterior descending and posterior lateral branches of the coronary artery.  . Us echocardiography 09-07-09    Est EF 40-45%      Current Outpatient Prescriptions  Medication Sig Dispense Refill  . Casanthranol-Docusate Sodium 30-100 MG CAPS Take by mouth 2 (two) times daily.      . dabigatran (PRADAXA) 150 MG CAPS Take 1 capsule (150 mg total) by mouth every 12 (twelve) hours.  60 capsule  11  . latanoprost (XALATAN) 0.005 % ophthalmic solution 1 drop at bedtime.        . losartan (COZAAR) 50 MG tablet Take 1 tablet (50 mg total) by mouth daily.  30 tablet  11  . metoprolol succinate (TOPROL-XL) 25 MG 24 hr tablet Take 1 tablet (25 mg total) by mouth daily.  90 tablet  3  . NIASPAN 750 MG CR tablet TAKE 2 TABLETS EVERY EVENING  180 tablet  4  . RABEprazole (ACIPHEX) 20 MG tablet Take 20 mg by mouth daily.        . traMADol (ULTRAM) 50 MG tablet TAKE 1 TABLET (50 MG TOTAL) BY MOUTH EVERY 6 (SIX) HOURS AS NEEDED FOR PAIN.  30 tablet  5  . VYTORIN 10-20 MG per tablet TAKE 1 TABLET EVERY EVENING    90 tablet  4     Allergies: Allergies  Allergen Reactions  . Ace Inhibitors     Cause a Cough  . Darvocet (Propoxyphene N-Acetaminophen)   . Penicillins      History  Substance Use Topics  . Smoking status: Former Smoker    Quit date: 01/22/1992  . Smokeless tobacco: Not on file  . Alcohol Use:       No family history on file.    ROS:  Please see the history of present illness.     All other systems reviewed and negative except nephrolithiasis, glaucoma on therapy and reflux.  Vital Signs: Blood pressure 120/68, pulse 71, height 5' 11" (1.803 m), weight 219 lb (99.338 kg).  PHYSICAL EXAM: General:  Well nourished, well developed male in no acute distress HEENT: normal Lymph: no adenopathy Neck: no JVD Endocrine:  No  thryomegaly Vascular: No carotid bruits; FA pulses 2+ bilaterally without bruits Cardiac:  normal S1, S2; RRR; no murmur Back: without kyphosis/scoliosis, no CVA tenderness Lungs:  clear to auscultation bilaterally, no wheezing, rhonchi or rales Abd: soft, nontender, no hepatomegaly Ext: no edema Musculoskeletal:  No deformities, BUE and BLE strength normal and equal Skin: warm and dry Neuro:  CNs 2-12 intact, no focal abnormalities noted Psych:  Normal affect   EKG:  Atrial flutter with negative flutter waves in the inferior leads upright flutter waves in leads aVR and largely isoelectric flutter waves in lead V1. Electrocardiogram 2011 demonstrated an atypical flutter with upright flutter waves in the inferior leads and negative flutter waves in V1 consistent with reverse typical flutter  Labs:   Lab Results  Component Value Date   WBC 16.4* 09/10/2010   HGB 14.6 09/10/2010   HCT 43.9 09/10/2010   MCV 87.5 09/10/2010   PLT 104* 09/10/2010       ASSESSMENT AND PLAN:        

## 2011-03-29 NOTE — Assessment & Plan Note (Signed)
As above In the event that he had inducible ventricular tachycardia I would recommend ICD implantation for secondary prevention

## 2011-03-29 NOTE — Patient Instructions (Signed)
Dr. Odessa Fleming nurse, Herbert Seta, will be in touch with you on Monday 3/11 about scheduling your procedure with Dr. Graciela Husbands.

## 2011-04-01 ENCOUNTER — Telehealth: Payer: Self-pay | Admitting: Internal Medicine

## 2011-04-01 ENCOUNTER — Ambulatory Visit (HOSPITAL_BASED_OUTPATIENT_CLINIC_OR_DEPARTMENT_OTHER): Payer: Medicare Other | Admitting: Oncology

## 2011-04-01 ENCOUNTER — Other Ambulatory Visit (HOSPITAL_BASED_OUTPATIENT_CLINIC_OR_DEPARTMENT_OTHER): Payer: Medicare Other | Admitting: Lab

## 2011-04-01 ENCOUNTER — Encounter: Payer: Self-pay | Admitting: Oncology

## 2011-04-01 VITALS — BP 109/73 | HR 66 | Temp 98.2°F | Ht 71.0 in | Wt 219.6 lb

## 2011-04-01 DIAGNOSIS — C9110 Chronic lymphocytic leukemia of B-cell type not having achieved remission: Secondary | ICD-10-CM

## 2011-04-01 DIAGNOSIS — D696 Thrombocytopenia, unspecified: Secondary | ICD-10-CM

## 2011-04-01 DIAGNOSIS — I4892 Unspecified atrial flutter: Secondary | ICD-10-CM

## 2011-04-01 LAB — CBC WITH DIFFERENTIAL/PLATELET
Basophils Absolute: 0 10*3/uL (ref 0.0–0.1)
Eosinophils Absolute: 0.1 10*3/uL (ref 0.0–0.5)
HGB: 13.6 g/dL (ref 13.0–17.1)
MCV: 85.7 fL (ref 79.3–98.0)
MONO#: 0.6 10*3/uL (ref 0.1–0.9)
NEUT#: 3.6 10*3/uL (ref 1.5–6.5)
RDW: 13.7 % (ref 11.0–14.6)
lymph#: 9.4 10*3/uL — ABNORMAL HIGH (ref 0.9–3.3)

## 2011-04-01 NOTE — Telephone Encounter (Signed)
I spoke with the patient and made him aware I am waiting on a call from Dr. Graciela Husbands with CRNA # and name of who I need to speak with.

## 2011-04-01 NOTE — Progress Notes (Signed)
OFFICE PROGRESS NOTE  CC  Rodney Hillier, MD, MD [address] [place] [address]  DIAGNOSIS: is 66 year old gentleman with stage 0 CLL  PRIOR THERAPY:observation  CURRENT THERAPY:observation  INTERVAL HISTORY: Rodney Burns 66 y.o. male returns for followup visit. Clinically from CLL perspective patient is doing well his blood count remains stable his white count is about 13 his platelets have slightly risen 208,000 and hemoglobin looks terrific. Patient himself is feeling quite well he denies any fevers chills night sweats headaches easy bruising or bleeding. He is having significant problems with atrial fibrillation. He tells me that his ejection fraction is only 40%. He is scheduled to have a ablation procedure performed with ICD placement on Monday of next week. Clinically however he tells me he feels fine. Remainder of the 10 point review of systems is negative.  MEDICAL HISTORY: Past Medical History  Diagnosis Date  . Atrial fibrillation     Status post successful DC cardioversion in September of 2011  . MI (myocardial infarction)     Remote anterior  . Coronary artery disease     Status post CABG in 1994  . CLL (chronic lymphoblastic leukemia)     Stage 0-1  . Hypercholesterolemia     Well Controlled  . Hypertension   . Cough     Consistant with ACE inhibitor mediated cough  . Malaria 1972    Hx of    ALLERGIES:  is allergic to ace inhibitors; darvocet; and penicillins.  MEDICATIONS:  Current Outpatient Prescriptions  Medication Sig Dispense Refill  . Casanthranol-Docusate Sodium 30-100 MG CAPS Take by mouth 2 (two) times daily.      . dabigatran (PRADAXA) 150 MG CAPS Take 1 capsule (150 mg total) by mouth every 12 (twelve) hours.  60 capsule  11  . latanoprost (XALATAN) 0.005 % ophthalmic solution 1 drop at bedtime.        [name] losartan (COZAAR) 50 MG tablet Take 1 tablet (50 mg total) by mouth daily.  30 tablet  11  . metoprolol succinate (TOPROL-XL)  25 MG 24 hr tablet Take 1 tablet (25 mg total) by mouth daily.  90 tablet  3  . NIASPAN 750 MG CR tablet TAKE 2 TABLETS EVERY EVENING  180 tablet  4  . RABEprazole (ACIPHEX) 20 MG tablet Take 20 mg by mouth daily.        . traMADol (ULTRAM) 50 MG tablet TAKE 1 TABLET (50 MG TOTAL) BY MOUTH EVERY 6 (SIX) HOURS AS NEEDED FOR PAIN.  30 tablet  5  . VYTORIN 10-20 MG per tablet TAKE 1 TABLET EVERY EVENING  90 tablet  4    SURGICAL HISTORY:  Past Surgical History  Procedure Date  . Coronary artery bypass graft 1994    By Dr. Laneta Simmers. This included an LIMA graft to the left circumflex coronary, a sequential saphenous vein graft to the diagonal and the LAD, and a vein graft to the posterior descending and posterior lateral branches of the coronary artery.  . US echocardiography 09-07-09    Est EF 40-45%    REVIEW OF SYSTEMS:  Pertinent items are noted in HPI.   PHYSICAL EXAMINATION: General appearance: alert, cooperative and appears stated age Neck: no adenopathy, no carotid bruit, no JVD, supple, symmetrical, trachea midline and thyroid not enlarged, symmetric, no tenderness/mass/nodules Lymph nodes: Cervical, supraclavicular, and axillary nodes normal. Resp: clear to auscultation bilaterally and normal percussion bilaterally Back: negative, symmetric, no curvature. ROM normal. No CVA tenderness. Cardio: irregularly  irregular rhythm GI: soft, non-tender; bowel sounds normal; no masses,  no organomegaly Extremities: extremities normal, atraumatic, no cyanosis or edema Neurologic: Alert and oriented X 3, normal strength and tone. Normal symmetric reflexes. Normal coordination and gait  ECOG PERFORMANCE STATUS: 0 - Asymptomatic  Blood pressure 109/73, pulse 66, temperature 98.2 F (36.8 C), temperature source Oral, height 5\' 11"  (1.803 m), weight 219 lb 9.6 oz (99.61 kg).  LABORATORY DATA: Lab Results  Component Value Date   WBC 13.7* 04/01/2011   HGB 13.6 04/01/2011   HCT 42.4 04/01/2011    MCV 85.7 04/01/2011   PLT 108* 04/01/2011      Chemistry      Component Value Date/Time   NA 141 04/01/2011 1004   K 4.0 04/01/2011 1004   CL 106 04/01/2011 1004   CO2 25 04/01/2011 1004   BUN 11 04/01/2011 1004   CREATININE 0.77 04/01/2011 1004      Component Value Date/Time   CALCIUM 9.5 04/01/2011 1004   ALKPHOS 40 04/01/2011 1004   AST 20 04/01/2011 1004   ALT 20 04/01/2011 1004   BILITOT 0.8 04/01/2011 1004       RADIOGRAPHIC STUDIES:  No results found.  ASSESSMENT: 66 year old gentleman with  #1 CLL stage 0 essentially remaining stable.  #2 atrial fibrillation.  #3 thrombocytopenia secondary to #1   PLAN:   #1 patient will continue to be observed for his CLL no acute intervention is needed.  #2 I will plan on seeing the patient back in 2 month time.   All questions were answered. The patient knows to call the clinic with any problems, questions or concerns. We can certainly see the patient much sooner if necessary.  I spent 25 minutes counseling the patient face to face. The total time spent in the appointment was 30 minutes.    Drue Second, MD Medical/Oncology [facility] [phone] (beeper) [phone] (Office)  04/01/2011, 4:55 PM

## 2011-04-01 NOTE — Patient Instructions (Signed)
1. YOU ARE DOING WELL  2. i WILL SEE YOU BACK IN 4 MONTHS OR SOONER IF NEED ARISES

## 2011-04-01 NOTE — Telephone Encounter (Signed)
Fu call °Patient returning your call °

## 2011-04-02 ENCOUNTER — Telehealth: Payer: Self-pay | Admitting: *Deleted

## 2011-04-02 ENCOUNTER — Encounter: Payer: Self-pay | Admitting: *Deleted

## 2011-04-02 LAB — COMPREHENSIVE METABOLIC PANEL
Albumin: 4.3 g/dL (ref 3.5–5.2)
BUN: 11 mg/dL (ref 6–23)
CO2: 25 mEq/L (ref 19–32)
Calcium: 9.5 mg/dL (ref 8.4–10.5)
Chloride: 106 mEq/L (ref 96–112)
Glucose, Bld: 94 mg/dL (ref 70–99)
Potassium: 4 mEq/L (ref 3.5–5.3)

## 2011-04-02 NOTE — Telephone Encounter (Signed)
Fu call Pt was calling back again about procedure on monday

## 2011-04-02 NOTE — Telephone Encounter (Signed)
I spoke with the patient and his wife. I explained anesthesia is unavailable for Monday. We do have him scheduled for Thursday 3/21 at 2:00 pm. They are agreeable. He will come on 3/19 for labs and an EKG.

## 2011-04-02 NOTE — Telephone Encounter (Signed)
left message to inform the patient of the new date and time on 05-31-2011 starting at 11:00am

## 2011-04-03 ENCOUNTER — Other Ambulatory Visit: Payer: Medicare Other | Admitting: Lab

## 2011-04-03 ENCOUNTER — Ambulatory Visit: Payer: Medicare Other | Admitting: Oncology

## 2011-04-03 ENCOUNTER — Encounter (HOSPITAL_COMMUNITY): Payer: Self-pay | Admitting: Pharmacy Technician

## 2011-04-04 ENCOUNTER — Other Ambulatory Visit: Payer: Self-pay | Admitting: Oncology

## 2011-04-05 ENCOUNTER — Other Ambulatory Visit: Payer: Self-pay | Admitting: Internal Medicine

## 2011-04-05 DIAGNOSIS — I4892 Unspecified atrial flutter: Secondary | ICD-10-CM

## 2011-04-08 ENCOUNTER — Ambulatory Visit (HOSPITAL_COMMUNITY): Admit: 2011-04-08 | Payer: Medicare Other | Admitting: Internal Medicine

## 2011-04-08 ENCOUNTER — Encounter (HOSPITAL_COMMUNITY): Payer: Self-pay

## 2011-04-08 SURGERY — ATRIAL FLUTTER ABLATION
Anesthesia: Monitor Anesthesia Care

## 2011-04-09 ENCOUNTER — Other Ambulatory Visit (INDEPENDENT_AMBULATORY_CARE_PROVIDER_SITE_OTHER): Payer: Medicare Other

## 2011-04-09 ENCOUNTER — Ambulatory Visit (INDEPENDENT_AMBULATORY_CARE_PROVIDER_SITE_OTHER): Payer: Medicare Other

## 2011-04-09 VITALS — BP 114/66 | HR 65 | Ht 71.0 in | Wt 220.4 lb

## 2011-04-09 DIAGNOSIS — I2589 Other forms of chronic ischemic heart disease: Secondary | ICD-10-CM | POA: Diagnosis not present

## 2011-04-09 DIAGNOSIS — R55 Syncope and collapse: Secondary | ICD-10-CM

## 2011-04-09 DIAGNOSIS — I4892 Unspecified atrial flutter: Secondary | ICD-10-CM

## 2011-04-09 DIAGNOSIS — I255 Ischemic cardiomyopathy: Secondary | ICD-10-CM

## 2011-04-09 LAB — CBC WITH DIFFERENTIAL/PLATELET
Basophils Absolute: 0 10*3/uL (ref 0.0–0.1)
Eosinophils Absolute: 0.1 10*3/uL (ref 0.0–0.7)
HCT: 43.3 % (ref 39.0–52.0)
Hemoglobin: 14 g/dL (ref 13.0–17.0)
Lymphocytes Relative: 66.3 % — ABNORMAL HIGH (ref 12.0–46.0)
Lymphs Abs: 8.1 10*3/uL — ABNORMAL HIGH (ref 0.7–4.0)
MCHC: 32.4 g/dL (ref 30.0–36.0)
MCV: 86.3 fl (ref 78.0–100.0)
Monocytes Absolute: 0.4 10*3/uL (ref 0.1–1.0)
Neutro Abs: 3.6 10*3/uL (ref 1.4–7.7)
RDW: 14 % (ref 11.5–14.6)

## 2011-04-09 LAB — PROTIME-INR
INR: 1.5 ratio — ABNORMAL HIGH (ref 0.8–1.0)
Prothrombin Time: 16.2 s — ABNORMAL HIGH (ref 10.2–12.4)

## 2011-04-09 LAB — BASIC METABOLIC PANEL
Calcium: 9.3 mg/dL (ref 8.4–10.5)
Creatinine, Ser: 0.9 mg/dL (ref 0.4–1.5)
GFR: 91.92 mL/min (ref >60.00)

## 2011-04-09 NOTE — Progress Notes (Signed)
Patient came by office for ekg.Ekg was done and Dr.Klein reviewed.

## 2011-04-10 DIAGNOSIS — I252 Old myocardial infarction: Secondary | ICD-10-CM | POA: Diagnosis not present

## 2011-04-10 DIAGNOSIS — Z7901 Long term (current) use of anticoagulants: Secondary | ICD-10-CM | POA: Diagnosis not present

## 2011-04-10 DIAGNOSIS — I10 Essential (primary) hypertension: Secondary | ICD-10-CM | POA: Diagnosis not present

## 2011-04-10 DIAGNOSIS — I4892 Unspecified atrial flutter: Secondary | ICD-10-CM | POA: Diagnosis not present

## 2011-04-10 DIAGNOSIS — E7800 Pure hypercholesterolemia, unspecified: Secondary | ICD-10-CM | POA: Diagnosis not present

## 2011-04-10 MED ORDER — SODIUM CHLORIDE 0.9 % IV SOLN
1500.0000 mg | INTRAVENOUS | Status: DC
  Filled 2011-04-10: qty 1500

## 2011-04-10 MED ORDER — GENTAMICIN SULFATE 40 MG/ML IJ SOLN
80.0000 mg | INTRAMUSCULAR | Status: DC
  Filled 2011-04-10: qty 2

## 2011-04-11 ENCOUNTER — Encounter (HOSPITAL_COMMUNITY): Payer: Self-pay | Admitting: Certified Registered Nurse Anesthetist

## 2011-04-11 ENCOUNTER — Ambulatory Visit (HOSPITAL_COMMUNITY)
Admission: RE | Admit: 2011-04-11 | Discharge: 2011-04-12 | Disposition: A | Discharge status: Home / Self Care | Payer: Medicare Other | Source: Ambulatory Visit | Attending: Internal Medicine | Admitting: Internal Medicine

## 2011-04-11 ENCOUNTER — Ambulatory Visit (HOSPITAL_COMMUNITY): Payer: Medicare Other | Admitting: Certified Registered Nurse Anesthetist

## 2011-04-11 ENCOUNTER — Encounter (HOSPITAL_COMMUNITY)
Admission: RE | Disposition: A | Discharge status: Home / Self Care | Payer: Self-pay | Source: Ambulatory Visit | Attending: Internal Medicine

## 2011-04-11 ENCOUNTER — Encounter (HOSPITAL_COMMUNITY): Payer: Self-pay | Admitting: *Deleted

## 2011-04-11 ENCOUNTER — Encounter (HOSPITAL_COMMUNITY): Payer: Medicare Other

## 2011-04-11 DIAGNOSIS — I252 Old myocardial infarction: Secondary | ICD-10-CM | POA: Insufficient documentation

## 2011-04-11 DIAGNOSIS — E7800 Pure hypercholesterolemia, unspecified: Secondary | ICD-10-CM | POA: Insufficient documentation

## 2011-04-11 DIAGNOSIS — I10 Essential (primary) hypertension: Secondary | ICD-10-CM

## 2011-04-11 DIAGNOSIS — I4892 Unspecified atrial flutter: Secondary | ICD-10-CM

## 2011-04-11 DIAGNOSIS — Z01811 Encounter for preprocedural respiratory examination: Secondary | ICD-10-CM | POA: Diagnosis not present

## 2011-04-11 DIAGNOSIS — Z7901 Long term (current) use of anticoagulants: Secondary | ICD-10-CM | POA: Insufficient documentation

## 2011-04-11 DIAGNOSIS — R55 Syncope and collapse: Secondary | ICD-10-CM | POA: Insufficient documentation

## 2011-04-11 DIAGNOSIS — C9110 Chronic lymphocytic leukemia of B-cell type not having achieved remission: Secondary | ICD-10-CM | POA: Insufficient documentation

## 2011-04-11 DIAGNOSIS — E785 Hyperlipidemia, unspecified: Secondary | ICD-10-CM | POA: Insufficient documentation

## 2011-04-11 HISTORY — PX: ATRIAL FLUTTER ABLATION: SHX5733

## 2011-04-11 HISTORY — PX: ELECTROPHYSIOLOGY STUDY: SHX5467

## 2011-04-11 LAB — SURGICAL PCR SCREEN: Staphylococcus aureus: NEGATIVE

## 2011-04-11 SURGERY — ATRIAL FLUTTER ABLATION
Anesthesia: General

## 2011-04-11 MED ORDER — ACETAMINOPHEN 325 MG PO TABS: 650.0000 mg | ORAL_TABLET | ORAL | Status: DC | PRN

## 2011-04-11 MED ORDER — PROPOFOL 10 MG/ML IV EMUL
INTRAVENOUS | Status: DC | PRN
  Administered 2011-04-11: 200 mg via INTRAVENOUS

## 2011-04-11 MED ORDER — MIDAZOLAM HCL 5 MG/5ML IJ SOLN
INTRAMUSCULAR | Status: DC | PRN
  Administered 2011-04-11: 2 mg via INTRAVENOUS

## 2011-04-11 MED ORDER — HYDROMORPHONE HCL PF 1 MG/ML IJ SOLN: 0.2500 mg | INTRAMUSCULAR | Status: DC | PRN

## 2011-04-11 MED ORDER — SODIUM CHLORIDE 0.45 % IV SOLN: INTRAVENOUS | Status: DC

## 2011-04-11 MED ORDER — FENTANYL CITRATE 0.05 MG/ML IJ SOLN
INTRAMUSCULAR | Status: DC | PRN
  Administered 2011-04-11: 50 ug via INTRAVENOUS

## 2011-04-11 MED ORDER — LOSARTAN POTASSIUM 50 MG PO TABS
50.0000 mg | ORAL_TABLET | Freq: Every day | ORAL | Status: DC
  Administered 2011-04-12: 50 mg via ORAL
  Filled 2011-04-11: qty 1

## 2011-04-11 MED ORDER — SODIUM CHLORIDE 0.9 % IV SOLN: INTRAVENOUS | Status: DC

## 2011-04-11 MED ORDER — EPHEDRINE SULFATE 50 MG/ML IJ SOLN
INTRAMUSCULAR | Status: DC | PRN
  Administered 2011-04-11: 5 mg via INTRAVENOUS
  Administered 2011-04-11: 10 mg via INTRAVENOUS
  Administered 2011-04-11: 5 mg via INTRAVENOUS
  Administered 2011-04-11: 10 mg via INTRAVENOUS
  Administered 2011-04-11: 5 mg via INTRAVENOUS

## 2011-04-11 MED ORDER — LIDOCAINE HCL (CARDIAC) 20 MG/ML IV SOLN
INTRAVENOUS | Status: DC | PRN
  Administered 2011-04-11: 80 mg via INTRAVENOUS

## 2011-04-11 MED ORDER — BUPIVACAINE HCL (PF) 0.25 % IJ SOLN
INTRAMUSCULAR | Status: AC
  Filled 2011-04-11: qty 30

## 2011-04-11 MED ORDER — METOPROLOL SUCCINATE ER 25 MG PO TB24
25.0000 mg | ORAL_TABLET | Freq: Every day | ORAL | Status: DC
  Administered 2011-04-12: 25 mg via ORAL
  Filled 2011-04-11: qty 1

## 2011-04-11 MED ORDER — SODIUM CHLORIDE 0.9 % IV SOLN: 250.0000 mL | INTRAVENOUS | Status: DC | PRN

## 2011-04-11 MED ORDER — DEXTROSE-NACL 5-0.45 % IV SOLN
INTRAVENOUS | Status: DC
  Administered 2011-04-11: 500 mL via INTRAVENOUS

## 2011-04-11 MED ORDER — CHLORHEXIDINE GLUCONATE 4 % EX LIQD
60.0000 mL | Freq: Once | CUTANEOUS | Status: DC
  Filled 2011-04-11: qty 60

## 2011-04-11 MED ORDER — LACTATED RINGERS IV SOLN
INTRAVENOUS | Status: DC | PRN
  Administered 2011-04-11: 17:00:00 via INTRAVENOUS

## 2011-04-11 MED ORDER — SODIUM CHLORIDE 0.9 % IJ SOLN
3.0000 mL | Freq: Two times a day (BID) | INTRAMUSCULAR | Status: DC
  Administered 2011-04-12: 3 mL via INTRAVENOUS

## 2011-04-11 MED ORDER — MORPHINE SULFATE 10 MG/ML IJ SOLN: 0.0500 mg/kg | INTRAMUSCULAR | Status: DC | PRN

## 2011-04-11 MED ORDER — PANTOPRAZOLE SODIUM 40 MG PO TBEC: 40.0000 mg | DELAYED_RELEASE_TABLET | Freq: Every day | ORAL | Status: DC

## 2011-04-11 MED ORDER — DEXTROSE-NACL 5-0.45 % IV SOLN
INTRAVENOUS | Status: DC | PRN
  Administered 2011-04-11: 16:00:00 via INTRAVENOUS

## 2011-04-11 MED ORDER — EZETIMIBE-SIMVASTATIN 10-20 MG PO TABS
1.0000 | ORAL_TABLET | Freq: Every day | ORAL | Status: DC
  Administered 2011-04-11: 1 via ORAL
  Filled 2011-04-11 (×2): qty 1

## 2011-04-11 MED ORDER — MUPIROCIN 2 % EX OINT
TOPICAL_OINTMENT | Freq: Once | CUTANEOUS | Status: DC
  Filled 2011-04-11: qty 22

## 2011-04-11 MED ORDER — MUPIROCIN 2 % EX OINT
TOPICAL_OINTMENT | CUTANEOUS | Status: AC
  Filled 2011-04-11: qty 22

## 2011-04-11 MED ORDER — ONDANSETRON HCL 4 MG/2ML IJ SOLN: 4.0000 mg | Freq: Four times a day (QID) | INTRAMUSCULAR | Status: DC | PRN

## 2011-04-11 MED ORDER — SODIUM CHLORIDE 0.9 % IJ SOLN: 3.0000 mL | INTRAMUSCULAR | Status: DC | PRN

## 2011-04-11 MED ORDER — SODIUM CHLORIDE 0.9 % IV SOLN
INTRAVENOUS | Status: AC
  Administered 2011-04-11: 50 mL/h via INTRAVENOUS

## 2011-04-11 MED ORDER — LATANOPROST 0.005 % OP SOLN
1.0000 [drp] | Freq: Every day | OPHTHALMIC | Status: DC
  Administered 2011-04-11: 1 [drp] via OPHTHALMIC
  Filled 2011-04-11: qty 2.5

## 2011-04-11 MED ORDER — DABIGATRAN ETEXILATE MESYLATE 150 MG PO CAPS
150.0000 mg | ORAL_CAPSULE | Freq: Two times a day (BID) | ORAL | Status: DC
  Administered 2011-04-11 – 2011-04-12 (×2): 150 mg via ORAL
  Filled 2011-04-11 (×3): qty 1

## 2011-04-11 MED ORDER — ONDANSETRON HCL 4 MG/2ML IJ SOLN
INTRAMUSCULAR | Status: DC | PRN
  Administered 2011-04-11: 4 mg via INTRAVENOUS

## 2011-04-11 MED ORDER — DOCUSATE SODIUM 100 MG PO CAPS
100.0000 mg | ORAL_CAPSULE | Freq: Two times a day (BID) | ORAL | Status: DC
  Administered 2011-04-11 – 2011-04-12 (×2): 100 mg via ORAL
  Filled 2011-04-11 (×3): qty 1

## 2011-04-11 NOTE — Anesthesia Preprocedure Evaluation (Addendum)
Anesthesia Evaluation  Patient identified by MRN, date of birth, ID band Patient awake    Reviewed: Allergy & Precautions, H&P , NPO status , Patient's Chart, lab work & pertinent test results, reviewed documented beta blocker date and time   Airway Mallampati: II TM Distance: >3 FB Neck ROM: Full    Dental  (+) Teeth Intact and Dental Advisory Given   Pulmonary neg pulmonary ROS,  breath sounds clear to auscultation  Pulmonary exam normal       Cardiovascular hypertension, Pt. on medications and Pt. on home beta blockers + CAD, + Past MI and + CABG + dysrhythmias Atrial Fibrillation Rhythm:Regular Rate:Normal     Neuro/Psych negative neurological ROS  negative psych ROS   GI/Hepatic Neg liver ROS, GERD-  Medicated and Controlled,  Endo/Other  negative endocrine ROS  Renal/GU negative Renal ROS     Musculoskeletal negative musculoskeletal ROS (+)   Abdominal Normal abdominal exam  (+)   Peds  Hematology CLL, no chemo, stable per patient    Anesthesia Other Findings   Reproductive/Obstetrics                          Anesthesia Physical Anesthesia Plan  ASA: III  Anesthesia Plan: General   Post-op Pain Management:    Induction: Intravenous  Airway Management Planned: LMA  Additional Equipment:   Intra-op Plan:   Post-operative Plan: Extubation in OR  Informed Consent: I have reviewed the patients History and Physical, chart, labs and discussed the procedure including the risks, benefits and alternatives for the proposed anesthesia with the patient or authorized representative who has indicated his/her understanding and acceptance.     Plan Discussed with: CRNA  Anesthesia Plan Comments:        Anesthesia Quick Evaluation

## 2011-04-11 NOTE — Anesthesia Procedure Notes (Signed)
Procedure Name: LMA Insertion Date/Time: 04/11/2011 4:07 PM Performed by: Delbert Harness Pre-anesthesia Checklist: Patient identified, Patient being monitored, Emergency Drugs available, Timeout performed and Suction available Patient Re-evaluated:Patient Re-evaluated prior to inductionOxygen Delivery Method: Circle system utilized Preoxygenation: Pre-oxygenation with 100% oxygen Intubation Type: IV induction Ventilation: Mask ventilation without difficulty LMA: LMA inserted and LMA with gastric port inserted LMA Size: 4.0 Number of attempts: 1 Dental Injury: Teeth and Oropharynx as per pre-operative assessment

## 2011-04-11 NOTE — Progress Notes (Signed)
Addended by: Judithe Modest D on: 04/11/2011 11:35 AM   Modules accepted: Orders

## 2011-04-11 NOTE — CV Procedure (Signed)
See dictated note of the same day for ablation of microreentrant atrial tachycardia No inducible VT  Observe overnight D/c in am on Pradaxa

## 2011-04-11 NOTE — Progress Notes (Signed)
The schedule has been a challenge today.  As such our start time will be delayed  prob > 1.5-2 hrs  The issue is whether to do today and if so how much  Have reviewed this issue with the family and will plan to proceed if we can start by 4 or so and then potentially defer the icd implant till tomorrow if he is found to be inducible  pt and his wife unader stand and agree

## 2011-04-11 NOTE — H&P (View-Only) (Signed)
History and Physical  Patient ID: Rodney Burns MRN: [id number], SOB: Nov 23, 1945 66 y.o. Date of Encounter: 03/29/2011, 5:28 PM  Primary Physician: [name], MD, MD Primary Cardiologist: PJ Primary Electrophysiologist:  SK  Chief Complaint: flutter  History of Present Illness: Rodney Burns is a 66 y.o. male  referred for consideration of atrial flutter ablation. He was initially identified as having atrial flutter September 2011. This is erroneous the described in the medical record as atrial fibrillation. It was atypical. Cardioversion was successful.  He was then found earlier this year to be in atrial flutter again with a different flutter morphology. This one also is somewhat atypical but is close to typical. He is without symptoms associated with this.  He was started on Pradaxa and has been on for about a month;  he has had some problems with GI intolerance of this drug.  Thromboembolic risk profile is notable for hypertension-1age-1 vascular disease-1 and left ventricular dysfunction-1 for CHADS-VASc score of 4.  About a year ago he had an episode of syncope. He got out of the car and collapsed to the ground. By the time his wife that the other side of the car he was standing up. He does not have a history of orthostatic lightheadedness. There has been no other syncope.  His past medical history is also notable for CLL.     Past Medical History  Diagnosis Date  . Atrial fibrillation     Status post successful DC cardioversion in September of 2011  . MI (myocardial infarction)     Remote anterior  . Coronary artery disease     Status post CABG in 1994  . CLL (chronic lymphoblastic leukemia)     Stage 0-1  . Hypercholesterolemia     Well Controlled  . Hypertension   . Cough     Consistant with ACE inhibitor mediated cough  . Malaria 1972    Hx of     Past Surgical History  Procedure Date  . Coronary artery bypass graft 1994    By Dr. Laneta Simmers. This  included an LIMA graft to the left circumflex coronary, a sequential saphenous vein graft to the diagonal and the LAD, and a vein graft to the posterior descending and posterior lateral branches of the coronary artery.  . US echocardiography 09-07-09    Est EF 40-45%      Current Outpatient Prescriptions  Medication Sig Dispense Refill  . Casanthranol-Docusate Sodium 30-100 MG CAPS Take by mouth 2 (two) times daily.      . dabigatran (PRADAXA) 150 MG CAPS Take 1 capsule (150 mg total) by mouth every 12 (twelve) hours.  60 capsule  11  . latanoprost (XALATAN) 0.005 % ophthalmic solution 1 drop at bedtime.        [name] losartan (COZAAR) 50 MG tablet Take 1 tablet (50 mg total) by mouth daily.  30 tablet  11  . metoprolol succinate (TOPROL-XL) 25 MG 24 hr tablet Take 1 tablet (25 mg total) by mouth daily.  90 tablet  3  . NIASPAN 750 MG CR tablet TAKE 2 TABLETS EVERY EVENING  180 tablet  4  . RABEprazole (ACIPHEX) 20 MG tablet Take 20 mg by mouth daily.        . traMADol (ULTRAM) 50 MG tablet TAKE 1 TABLET (50 MG TOTAL) BY MOUTH EVERY 6 (SIX) HOURS AS NEEDED FOR PAIN.  30 tablet  5  . VYTORIN 10-20 MG per tablet TAKE 1 TABLET EVERY EVENING  90 tablet  4     Allergies: Allergies  Allergen Reactions  . Ace Inhibitors     Cause a Cough  . Darvocet (Propoxyphene N-Acetaminophen)   . Penicillins      History  Substance Use Topics  . Smoking status: Former Smoker    Quit date: 01/22/1992  . Smokeless tobacco: Not on file  . Alcohol Use:       No family history on file.    ROS:  Please see the history of present illness.     All other systems reviewed and negative except nephrolithiasis, glaucoma on therapy and reflux.  Vital Signs: Blood pressure 120/68, pulse 71, height 5\' 11"  (1.803 m), weight 219 lb (99.338 kg).  PHYSICAL EXAM: General:  Well nourished, well developed male in no acute distress HEENT: normal Lymph: no adenopathy Neck: no JVD Endocrine:  No  thryomegaly Vascular: No carotid bruits; FA pulses 2+ bilaterally without bruits Cardiac:  normal S1, S2; RRR; no murmur Back: without kyphosis/scoliosis, no CVA tenderness Lungs:  clear to auscultation bilaterally, no wheezing, rhonchi or rales Abd: soft, nontender, no hepatomegaly Ext: no edema Musculoskeletal:  No deformities, BUE and BLE strength normal and equal Skin: warm and dry Neuro:  CNs 2-12 intact, no focal abnormalities noted Psych:  Normal affect   EKG:  Atrial flutter with negative flutter waves in the inferior leads upright flutter waves in leads aVR and largely isoelectric flutter waves in lead V1. Electrocardiogram 2011 demonstrated an atypical flutter with upright flutter waves in the inferior leads and negative flutter waves in V1 consistent with reverse typical flutter  Labs:   Lab Results  Component Value Date   WBC 16.4* 09/10/2010   HGB 14.6 09/10/2010   HCT 43.9 09/10/2010   MCV 87.5 09/10/2010   PLT 104* 09/10/2010       ASSESSMENT AND PLAN:

## 2011-04-11 NOTE — Interval H&P Note (Signed)
History and Physical Interval Note:  04/11/2011 1:32 PM  Rodney Burns  has presented today for surgery, with the diagnosis of Aflutter  The various methods of treatment have been discussed with the patient and family. After consideration of risks, benefits and other options for treatment, the patient has consented to  Procedure(s) (LRB): ATRIAL FLUTTER ABLATION (N/A) ELECTROPHYSIOLOGY STUDY (N/A) as a surgical intervention .  The patients' history has been reviewed, patient examined, no change in status, stable for surgery.  I have reviewed the patients' chart and labs.  Questions were answered to the patient's satisfaction.     Sherryl Manges

## 2011-04-11 NOTE — Preoperative (Signed)
Beta Blockers   Reason not to administer Beta Blockers:Not Applicable, took metoprolol this am 

## 2011-04-11 NOTE — Transfer of Care (Signed)
Immediate Anesthesia Transfer of Care Note  Patient: Rodney Burns  Procedure(s) Performed: Procedure(s) (LRB): ATRIAL FLUTTER ABLATION (N/A) ELECTROPHYSIOLOGY STUDY (N/A)  Patient Location: Cath Lab  Anesthesia Type: General  Level of Consciousness: awake, alert , oriented and patient cooperative  Airway & Oxygen Therapy: Patient Spontanous Breathing  Post-op Assessment: Report given to PACU RN and Post -op Vital signs reviewed and stable  Post vital signs: Reviewed and stable  Complications: No apparent anesthesia complications

## 2011-04-12 ENCOUNTER — Other Ambulatory Visit: Payer: Self-pay

## 2011-04-12 ENCOUNTER — Encounter (HOSPITAL_COMMUNITY): Payer: Self-pay | Admitting: Nurse Practitioner

## 2011-04-12 DIAGNOSIS — I4892 Unspecified atrial flutter: Secondary | ICD-10-CM | POA: Diagnosis not present

## 2011-04-12 DIAGNOSIS — I10 Essential (primary) hypertension: Secondary | ICD-10-CM | POA: Diagnosis not present

## 2011-04-12 DIAGNOSIS — I252 Old myocardial infarction: Secondary | ICD-10-CM | POA: Diagnosis not present

## 2011-04-12 DIAGNOSIS — Z7901 Long term (current) use of anticoagulants: Secondary | ICD-10-CM | POA: Diagnosis not present

## 2011-04-12 MED ORDER — OFF THE BEAT BOOK
Freq: Once | Status: DC
  Filled 2011-04-12: qty 1

## 2011-04-12 NOTE — Discharge Summary (Signed)
Patient ID: Rodney Burns,  MRN: [id number], DOB/AGE: 03/10/1945 66 y.o.  Admit date: 04/11/2011 Discharge date: 04/12/2011  Primary Care Provider: [name], MD Primary Cardiologist: P. [name], MD/S. Graciela Husbands, MD  Discharge Diagnoses Principal Problem:  *Atrial flutter Active Problems:  Coronary artery disease  CLL (chronic lymphocytic leukemia)  Hypercholesterolemia  Hypertension  Syncope and collapse  Ischemic cardiomyopathy   Allergies Allergies  Allergen Reactions  . Ace Inhibitors     Cause a Cough  . Darvocet (Propoxyphene N-Acetaminophen)     Tolerates tylenol  . Penicillins     Procedures  04/11/2011 EP Study & Radiofrequency Catheter Ablation  IMPRESSION: 1. Normal sinus function. 2. Abnormal atrial function manifested by an atrial tachycardia that     was peri-tricuspid annular at about 8 o'clock.  There are     significantly fractionated electrograms in this area, the cause of     which is not clear.  Tachycardia was gradually slowed by ablation     in this region prior to its termination. 3. Normal atrioventricular nodal function as assessed, which was     incomplete. 4. Normal His-Purkinje system function. 5. No accessory pathway. 6. Normal ventricular response to programmed stimulation without     inducible ventricular arrhythmias.   History of Present Illness  66 y/o male with h/o atrial flutter s/p dccv in 09/2009.  Unfortunately, pt developed recurrent atrial flutter.  Further, about 1 year ago, he had an episode of syncope of unknown etiology.  He was recently seen in clinic by Dr. Graciela Husbands and because of his syncope with known ischemic cardiomyopathy, along with atrial flutter, decision was made to pursue EP study and consideration of ICD placement &/or atrial flutter ablation if appropriate.  Hospital Course  Pt presented [facility] on 3/21.  He underwent EP study as outlined above.  No inducible VT was noted.  Atrial flutter ablation was  carried out and was successful.  He tolerated procedure well and is maintaining sinus rhythm this morning.  He will be discharged home today in good condition.  He will continue to take Pradaxa for 4 additional weeks.  Discharge Vitals Blood pressure 128/62, pulse 97, temperature 98.6 F (37 C), temperature source Oral, resp. rate 18, height 5\' 11"  (1.803 m), weight 210 lb 4.8 oz (95.391 kg), SpO2 98.00%.  Filed Weights   04/11/11 1207 04/12/11 0005  Weight: 220 lb (99.791 kg) 210 lb 4.8 oz (95.391 kg)   Labs  CBC  Basename 04/09/11 1018  WBC 12.2*  NEUTROABS 3.6  HGB 14.0  HCT 43.3  MCV 86.3  PLT 109.0*   Basic Metabolic Panel  Basename 04/09/11 1018  NA 141  K 3.8  CL 107  CO2 29  GLUCOSE 119*  BUN 12  CREATININE 0.9  CALCIUM 9.3  MG --  PHOS --   Disposition  Pt is being discharged home today in good condition.  Follow-up Plans & Appointments  Follow-up Information    Follow up with Sherryl Manges, MD on 05/10/2011. (11:30 AM)    Contact information:   [address], [address] [place] [address] [phone]       Follow up with Peter [name], MD in 4 months.   Contact information:   [address], [address] [place] [address] [phone]          Discharge Medications  Medication List  As of 04/12/2011 10:14 AM   TAKE these medications  dabigatran 150 MG Caps   Commonly known as: PRADAXA   Take 150 mg by mouth 2 (two) times daily.      docusate sodium 100 MG capsule   Commonly known as: COLACE   Take 100 mg by mouth 2 (two) times daily.      ezetimibe-simvastatin 10-20 MG per tablet   Commonly known as: VYTORIN   Take 1 tablet by mouth at bedtime.      Fiber Chew   Chew 2 tablets by mouth every morning.      latanoprost 0.005 % ophthalmic solution   Commonly known as: XALATAN   Place 1 drop into both eyes at bedtime.      losartan 50 MG tablet   Commonly known as:  COZAAR   Take 1 tablet (50 mg total) by mouth daily.      metoprolol succinate 25 MG 24 hr tablet   Commonly known as: TOPROL-XL   Take 1 tablet (25 mg total) by mouth daily.      niacin 750 MG CR tablet   Commonly known as: NIASPAN   Take 1,500 mg by mouth at bedtime.      RABEprazole 20 MG tablet   Commonly known as: ACIPHEX   Take 20 mg by mouth daily.      traMADol 50 MG tablet   Commonly known as: ULTRAM   Take 50 mg by mouth every 6 (six) hours as needed. For pain            Outstanding Labs/Studies  None  Duration of Discharge Encounter   Greater than 30 minutes including physician time.  Signed, Nicolasa Ducking NP 04/12/2011, 10:14 AM

## 2011-04-12 NOTE — Discharge Summary (Signed)
Post ablation of the micro-recurrent atrial tachycardia; negative EP study for syncope in the setting of nonischemic cardiomyopathy

## 2011-04-12 NOTE — Op Note (Signed)
Rodney Burns, Rodney Burns NO.:  [id number]  MEDICAL RECORD NO.:  [id number]  LOCATION:  2507                         FACILITY:  [facility]  PHYSICIAN:  Duke Salvia, MD, FACCDATE OF BIRTH:  Dec 15, 1945  DATE OF PROCEDURE:  04/11/2011 DATE OF DISCHARGE:                              OPERATIVE REPORT   PREOPERATIVE DIAGNOSES:  Syncope and atrial flutter.  POSTOPERATIVE DIAGNOSES:  No inducible ventricular arrhythmias; microreentrant atrial tachycardia from the tricuspid anulus, the lateral posterior area at about 8 o'clock on the tricuspid anulus.  PROCEDURES:  Invasive electrophysiological study, arrhythmia mapping, and catheter ablation.  DESCRIPTION OF THE PROCEDURE:  Following obtaining informed consent, the patient was brought to the Electrophysiology Laboratory and placed on the fluoroscopic table in the supine position.  After routine prep and drape, the patient was submitted to general anesthesia under the care of Dr. Randa Evens.  Following draping, catheterization was performed with local anesthesia.  Following the procedure, the catheters were removed, the sheaths were in place, then the patient was transferred to the holding area for sheath removal in stable condition.  CATHETERS:  A 5-French quadripolar catheter was inserted via the left femoral vein to the AV junction. A 6-French octapolar catheter was inserted via the right femoral vein to the coronary artery sinus. A 7-French duodecapolar catheter was inserted via the left femoral vein to the tricuspid anulus. A 7-French 5-mm deflectable tip ablation catheter was inserted via the right femoral vein to mapping the sites on the tricuspid anulus.   Surface leads I, aVF, and V1 were monitored continuously throughout the procedure.  Following insertion of the catheters, the stimulation protocol included: Incremental atrial pacing. Incremental ventricular pacing. Entrainment mapping from multiple sites in  the atrium. Single, double and triple ventricular extrastimuli from the right ventricular apex.  END-TIDAL RESULTS:  Intracardiac intervals and surface measurements. Rhythm initial:  Atrial flutter; RR interval 996 msec; AA interval 248 msec; QRS duration 124 msec; QT interval 471 msec; AV interval M/A; HV interval 56 msec.  Final: Rhythm:  Sinus; RR interval 820 msec; PR interval 220 msec; P-wave duration 148 msec; QRS duration 140 msec; QT interval 477 msec; AH intervals M/M; HV interval 59 msec.  AV nodal function was not assessed.  Accessory pathway function:  No evidence of an accessory pathway was identified.  Ventricular response to programmed stimulation:  The ventricular effective refractory period at a paced cycle length of 600 msec was 290 msec and 400 msec was 260 msec.  The closest coupling interval at the right ventricular apex at a paced cycle length of 600 msec was 290:260:220 and 400 msec was 260:240:220. No ventricular arrhythmias were induced.  The patient's atrial flutter was mapped with entrainment mapping.  There was an emanating earliest activation site on the tricuspid anulus with a very fractionated electrogram.  Entrainment from this area was closed to the cycle length.  Entrainment anywhere else in the right atrium and left atrium had very long PPIs.  We then targeted this area with ablation.  Not appreciated during the ablation, but noted afterwards was progressive prolongation of the cycle length.  Initially, it was about 240 msec prior to  the final ablation, lesion was 330 msec and this resulted in termination of tachycardia.  IMPRESSION: 1. Normal sinus function. 2. Abnormal atrial function manifested by an atrial tachycardia that     was peri-tricuspid annular at about 8 o'clock.  There are     significantly fractionated electrograms in this area, the cause of     which is not clear.  Tachycardia was gradually slowed by ablation     in this  region prior to its termination. 3. Normal atrioventricular nodal function as assessed, which was     incomplete. 4. Normal His-Purkinje system function. 5. No accessory pathway. 6. Normal ventricular response to programmed stimulation without     inducible ventricular arrhythmias.  In conclusion, the results of electrophysiological testing and arrhythmia mapping demonstrated a microreentrant circuit on the tricuspid anulus, which was successfully ablated.  No ventricular arrhythmias were identified to explain the patient's syncope, which may well have been orthostatic.  The patient will be observed overnight, will be continued on Pradaxa and then will be discharged if everything is okay in the morning.     Duke Salvia, MD, [facility]     SCK/MEDQ  D:  04/11/2011  T:  04/12/2011  Job:  [id number]

## 2011-04-12 NOTE — Progress Notes (Signed)
  Patient Name: Rodney Burns      SUBJECTIVE:with mild groin discomfort  Past Medical History  Diagnosis Date  . Atrial fibrillation     Status post successful DC cardioversion in September of 2011  . MI (myocardial infarction)     Remote anterior  . Coronary artery disease     Status post CABG in 1994  . CLL (chronic lymphoblastic leukemia)     Stage 0-1  . Hypercholesterolemia     Well Controlled  . Hypertension   . Cough     Consistant with ACE inhibitor mediated cough  . Malaria 1972    Hx of    PHYSICAL EXAM Filed Vitals:   04/11/11 2055 04/11/11 2100 04/12/11 0005 04/12/11 0500  BP: 110/68 116/73 122/74 123/73  Pulse: 105 104 96 97  Temp: 97.6 F (36.4 C)  98.2 F (36.8 C) 98 F (36.7 C)  TempSrc: Oral  Oral Oral  Resp: 19 15 19 20   Height:      Weight:   210 lb 4.8 oz (95.391 kg)   SpO2: 98% 96% 93% 94%    Well developed and nourished in no acute distress HENT normal Neck supple with JVP-flat Clear Regular rate and rhythm, no murmurs or gallops Abd-soft with active BS Groins ok No Clubbing cyanosis edema Skin-warm and dry A & Oriented  Grossly normal sensory and motor function  TELEMETRY: Reviewed telemetry pt in sinus w pvc:    Intake/Output Summary (Last 24 hours) at 04/12/11 0818 Last data filed at 04/11/11 2100  Gross per 24 hour  Intake    910 ml  Output      0 ml  Net    910 ml    LABS: Basic Metabolic Panel:  Lab 04/09/11 1610  NA 141  K 3.8  CL 107  CO2 29  GLUCOSE 119*  BUN 12  CREATININE 0.9  CALCIUM 9.3  MG --  PHOS --   Cardiac Enzymes: No results found for this basename: CKTOTAL:3,CKMB:3,CKMBINDEX:3,TROPONINI:3 in the last 72 hours CBC:  Lab 04/09/11 1018  WBC 12.2*  NEUTROABS 3.6  HGB 14.0  HCT 43.3  MCV 86.3  PLT 109.0*   PROTIME:  Basename 04/09/11 1018  LABPROT 16.2*  INR 1.5*      ASSESSMENT AND PLAN:  Atach s/p ablation Syncope  No inducible VT ICM Postural tachycardia and some  lightheadeedness  D/c to home pradaxa x 4 wks F/u sk 4-6 wks F/u pj  4 m  Signed, Sherryl Manges MD  04/12/2011

## 2011-04-12 NOTE — Discharge Instructions (Signed)

## 2011-04-14 NOTE — Anesthesia Postprocedure Evaluation (Signed)
Anesthesia Post Note  Patient: Rodney Burns  Procedure(s) Performed: Procedure(s) (LRB): ATRIAL FLUTTER ABLATION (N/A) ELECTROPHYSIOLOGY STUDY (N/A)  Anesthesia type: General  Patient location: PACU  Post pain: Pain level controlled and Adequate analgesia  Post assessment: Post-op Vital signs reviewed, Patient's Cardiovascular Status Stable, Respiratory Function Stable, Patent Airway and Pain level controlled  Last Vitals:  Filed Vitals:   04/12/11 0740  BP: 128/62  Pulse:   Temp: 37 C  Resp: 18    Post vital signs: Reviewed and stable  Level of consciousness: awake, alert  and oriented  Complications: No apparent anesthesia complications

## 2011-04-18 ENCOUNTER — Other Ambulatory Visit: Payer: Self-pay | Admitting: *Deleted

## 2011-04-18 DIAGNOSIS — I10 Essential (primary) hypertension: Secondary | ICD-10-CM

## 2011-04-18 MED ORDER — LOSARTAN POTASSIUM 50 MG PO TABS: 50.0000 mg | ORAL_TABLET | Freq: Every day | ORAL | Status: DC

## 2011-05-07 ENCOUNTER — Ambulatory Visit (INDEPENDENT_AMBULATORY_CARE_PROVIDER_SITE_OTHER): Payer: Medicare Other | Admitting: Internal Medicine

## 2011-05-07 ENCOUNTER — Encounter: Payer: Self-pay | Admitting: *Deleted

## 2011-05-07 ENCOUNTER — Encounter: Payer: Self-pay | Admitting: Internal Medicine

## 2011-05-07 VITALS — BP 110/64 | HR 77 | Ht 71.0 in | Wt 221.4 lb

## 2011-05-07 DIAGNOSIS — I2589 Other forms of chronic ischemic heart disease: Secondary | ICD-10-CM | POA: Diagnosis not present

## 2011-05-07 DIAGNOSIS — I4892 Unspecified atrial flutter: Secondary | ICD-10-CM | POA: Diagnosis not present

## 2011-05-07 DIAGNOSIS — I255 Ischemic cardiomyopathy: Secondary | ICD-10-CM

## 2011-05-07 MED ORDER — AMIODARONE HCL 400 MG PO TABS: ORAL_TABLET | ORAL | Status: DC

## 2011-05-07 NOTE — Patient Instructions (Addendum)
Your physician has recommended you make the following change in your medication:  1) Start amiodarone 400 mg one tablet by mouth twice daily x 2 weeks, then decrease to one tablet by mouth once daily x 2 weeks.  Your physician has recommended that you have a Cardioversion (DCCV). Electrical Cardioversion uses a jolt of electricity to your heart either through paddles or wired patches attached to your chest. This is a controlled, usually prescheduled, procedure. Defibrillation is done under light anesthesia in the hospital, and you usually go home the day of the procedure. This is done to get your heart back into a normal rhythm. You are not awake for the procedure. Please see the instruction sheet given to you today.  Your physician recommends that you schedule a follow-up appointment in: 6 weeks with Dr. Graciela Husbands.

## 2011-05-07 NOTE — Progress Notes (Signed)
HPI  Rodney Burns is a 66 y.o. male Who underwent mapping and ablation of what was thought to be atrial flutter that turned out to be a periannular tricuspid   atrial tachycardia. We had slowing prior to termination and non-reducibility.  He is feeling better at this point but still with some limitations  Past Medical History  Diagnosis Date  . Atrial flutter     Status post successful DC cardioversion in September of 2011; s/p RFCA 03/2011  . MI (myocardial infarction)     Remote anterior  . Coronary artery disease     Status post CABG in 1994  . CLL (chronic lymphoblastic leukemia)     Stage 0-1  . Hypercholesterolemia     Well Controlled  . Hypertension   . Cough     Consistant with ACE inhibitor mediated cough  . Malaria 1972    Hx of    Past Surgical History  Procedure Date  . Coronary artery bypass graft 1994    By Dr. Laneta Simmers. This included an LIMA graft to the left circumflex coronary, a sequential saphenous vein graft to the diagonal and the LAD, and a vein graft to the posterior descending and posterior lateral branches of the coronary artery.  . US echocardiography 09-07-09    Est EF 40-45%    Current Outpatient Prescriptions  Medication Sig Dispense Refill  . dabigatran (PRADAXA) 150 MG CAPS Take 150 mg by mouth 2 (two) times daily.      [name] docusate sodium (COLACE) 100 MG capsule Take 100 mg by mouth 2 (two) times daily.      [name] ezetimibe-simvastatin (VYTORIN) 10-20 MG per tablet Take 1 tablet by mouth at bedtime.      . Fiber CHEW Chew 2 tablets by mouth every morning.      . latanoprost (XALATAN) 0.005 % ophthalmic solution Place 1 drop into both eyes at bedtime.       [name] loratadine (CLARITIN) 10 MG tablet Take 10 mg by mouth daily.      [name] losartan (COZAAR) 50 MG tablet Take 1 tablet (50 mg total) by mouth daily.  30 tablet  6  . metoprolol succinate (TOPROL-XL) 25 MG 24 hr tablet Take 1 tablet (25 mg total) by mouth daily.  90 tablet  3  . niacin (NIASPAN) 750  MG CR tablet Take 1,500 mg by mouth at bedtime.      . RABEprazole (ACIPHEX) 20 MG tablet Take 20 mg by mouth daily.        . traMADol (ULTRAM) 50 MG tablet Take 50 mg by mouth every 6 (six) hours as needed. For pain        Allergies  Allergen Reactions  . Ace Inhibitors     Cause a Cough  . Darvocet (Propoxacet-N)     Tolerates tylenol  . Penicillins     Review of Systems negative except from HPI and PMH  Physical Exam BP 110/64  Pulse 77  Ht 5\' 11"  (1.803 m)  Wt 221 lb 6.4 oz (100.426 kg)  BMI 30.88 kg/m2 Well developed and well nourished in no acute distress HENT normal E scleral and icterus clear Neck Supple JVP flutter wave ; carotids brisk and full Clear to ausculation Regular rate and rhythm, no murmurs gallops or rub Soft with active bowel sounds No clubbing cyanosis  Edema Alert and oriented, grossly normal motor and sensory function Skin Warm and Dry   ECG 3:1 Aflutter  This is different from rhtyhm identified  in march and also different from sept   Assessment and  Plan

## 2011-05-08 NOTE — Assessment & Plan Note (Signed)
qw qbove

## 2011-05-08 NOTE — Assessment & Plan Note (Signed)
We had a lengthy discussion regarding his recurrent rhythms and whether we can demonstrate any appreciable symptomatic improvement by restoration of sinus rhythm would then clarify the value of more complex ablation.  Has he has CAD the easiet drug in the short term is amio and willl begin with anticipation of DCCV in 3-4 weeks and then we see how he fares.  The other advantage which would not be able to avail ourselves of would be discontinuation of anticoagulation with CHADSVASC score of 4 and multiple arrhythmias  We discussed amio sideeffects

## 2011-05-09 ENCOUNTER — Ambulatory Visit: Payer: Medicare Other | Admitting: Cardiology

## 2011-05-10 ENCOUNTER — Encounter: Payer: Medicare Other | Admitting: Internal Medicine

## 2011-05-31 ENCOUNTER — Telehealth: Payer: Self-pay | Admitting: Oncology

## 2011-05-31 ENCOUNTER — Ambulatory Visit (HOSPITAL_BASED_OUTPATIENT_CLINIC_OR_DEPARTMENT_OTHER): Payer: Medicare Other | Admitting: Oncology

## 2011-05-31 ENCOUNTER — Other Ambulatory Visit (HOSPITAL_BASED_OUTPATIENT_CLINIC_OR_DEPARTMENT_OTHER): Payer: Medicare Other | Admitting: Lab

## 2011-05-31 ENCOUNTER — Encounter: Payer: Self-pay | Admitting: Oncology

## 2011-05-31 VITALS — BP 130/73 | HR 62 | Temp 98.3°F | Ht 71.0 in | Wt 218.8 lb

## 2011-05-31 DIAGNOSIS — D6959 Other secondary thrombocytopenia: Secondary | ICD-10-CM | POA: Diagnosis not present

## 2011-05-31 DIAGNOSIS — C9110 Chronic lymphocytic leukemia of B-cell type not having achieved remission: Secondary | ICD-10-CM

## 2011-05-31 DIAGNOSIS — I4891 Unspecified atrial fibrillation: Secondary | ICD-10-CM | POA: Diagnosis not present

## 2011-05-31 LAB — CBC WITH DIFFERENTIAL/PLATELET
BASO%: 0.1 % (ref 0.0–2.0)
EOS%: 0.8 % (ref 0.0–7.0)
HCT: 45.9 % (ref 38.4–49.9)
LYMPH%: 75.6 % — ABNORMAL HIGH (ref 14.0–49.0)
MCH: 27.4 pg (ref 27.2–33.4)
MCHC: 32.2 g/dL (ref 32.0–36.0)
NEUT%: 21.6 % — ABNORMAL LOW (ref 39.0–75.0)
Platelets: 130 10*3/uL — ABNORMAL LOW (ref 140–400)
RBC: 5.4 10*6/uL (ref 4.20–5.82)
nRBC: 0 % (ref 0–0)

## 2011-05-31 NOTE — Telephone Encounter (Signed)
gve the pt his aug 2013 appt calendar °

## 2011-05-31 NOTE — Patient Instructions (Addendum)
1. I will see you back in 3 months

## 2011-05-31 NOTE — Progress Notes (Signed)
OFFICE PROGRESS NOTE  CC  [name], MD, MD [address] [place] [address]  DIAGNOSIS: is 66 year old gentleman with stage 0 CLL  PRIOR THERAPY:observation  CURRENT THERAPY:observation  INTERVAL HISTORY: Rodney Burns 66 y.o. male returns for followup visit. Overall he feels well. He does continue to have atrial flutter/ fibrillation. He is being seen by Dr. Graciela Husbands who is planning on doing a cardioversion on Monday of next week. Patient remains relatively asymptomatic from his cardiac issues. He denies having any bleeding problems headache shortness of breath chest pains palpitations no myalgias or arthralgias no easy bruising no infections. Remainder of the 10 point review of systems is negative. MEDICAL HISTORY: Past Medical History  Diagnosis Date  . Atrial flutter     Status post successful DC cardioversion in September of 2011; s/p RFCA 03/2011  . MI (myocardial infarction)     Remote anterior  . Coronary artery disease     Status post CABG in 1994  . CLL (chronic lymphoblastic leukemia)     Stage 0-1  . Hypercholesterolemia     Well Controlled  . Hypertension   . Cough     Consistant with ACE inhibitor mediated cough  . Malaria 1972    Hx of    ALLERGIES:  is allergic to ace inhibitors; darvocet; and penicillins.  MEDICATIONS:  Current Outpatient Prescriptions  Medication Sig Dispense Refill  . amiodarone (PACERONE) 200 MG tablet Take 400 mg by mouth 2 (two) times daily. For 2 wks, then 1 tablet for the next 2 wks      . dabigatran (PRADAXA) 150 MG CAPS Take 150 mg by mouth 2 (two) times daily.      [name] docusate sodium (COLACE) 100 MG capsule Take 100 mg by mouth 2 (two) times daily.      [name] ezetimibe-simvastatin (VYTORIN) 10-20 MG per tablet Take 1 tablet by mouth at bedtime.      . Fiber CHEW Chew 2 tablets by mouth every morning.      . latanoprost (XALATAN) 0.005 % ophthalmic solution Place 1 drop into both eyes at bedtime.       [name] loratadine  (CLARITIN) 10 MG tablet Take 10 mg by mouth daily.      [name] losartan (COZAAR) 50 MG tablet Take 1 tablet (50 mg total) by mouth daily.  30 tablet  6  . metoprolol succinate (TOPROL-XL) 25 MG 24 hr tablet Take 1 tablet (25 mg total) by mouth daily.  90 tablet  3  . niacin (NIASPAN) 750 MG CR tablet Take 1,500 mg by mouth at bedtime.      . RABEprazole (ACIPHEX) 20 MG tablet Take 20 mg by mouth daily.        . traMADol (ULTRAM) 50 MG tablet Take 50 mg by mouth every 6 (six) hours as needed. For pain        SURGICAL HISTORY:  Past Surgical History  Procedure Date  . Coronary artery bypass graft 1994    By Dr. Laneta Simmers. This included an LIMA graft to the left circumflex coronary, a sequential saphenous vein graft to the diagonal and the LAD, and a vein graft to the posterior descending and posterior lateral branches of the coronary artery.  . US echocardiography 09-07-09    Est EF 40-45%    REVIEW OF SYSTEMS:  Pertinent items are noted in HPI.   PHYSICAL EXAMINATION: General appearance: alert, cooperative and appears stated age Neck: no adenopathy, no carotid bruit, no JVD, supple, symmetrical,  trachea midline and thyroid not enlarged, symmetric, no tenderness/mass/nodules Lymph nodes: Cervical, supraclavicular, and axillary nodes normal. Resp: clear to auscultation bilaterally and normal percussion bilaterally Back: negative, symmetric, no curvature. ROM normal. No CVA tenderness. Cardio: irregularly irregular rhythm GI: soft, non-tender; bowel sounds normal; no masses,  no organomegaly Extremities: extremities normal, atraumatic, no cyanosis or edema Neurologic: Alert and oriented X 3, normal strength and tone. Normal symmetric reflexes. Normal coordination and gait  ECOG PERFORMANCE STATUS: 0 - Asymptomatic  Blood pressure 130/73, pulse 62, temperature 98.3 F (36.8 C), height 5\' 11"  (1.803 m), weight 218 lb 12.8 oz (99.247 kg).  LABORATORY DATA: Lab Results  Component Value Date   WBC  18.0* 05/31/2011   HGB 14.8 05/31/2011   HCT 45.9 05/31/2011   MCV 85.0 05/31/2011   PLT 130* 05/31/2011      Chemistry      Component Value Date/Time   NA 141 04/09/2011 1018   K 3.8 04/09/2011 1018   CL 107 04/09/2011 1018   CO2 29 04/09/2011 1018   BUN 12 04/09/2011 1018   CREATININE 0.9 04/09/2011 1018      Component Value Date/Time   CALCIUM 9.3 04/09/2011 1018   ALKPHOS 40 04/01/2011 1004   AST 20 04/01/2011 1004   ALT 20 04/01/2011 1004   BILITOT 0.8 04/01/2011 1004       RADIOGRAPHIC STUDIES:  No results found.  ASSESSMENT: 66 year old gentleman with  #1 CLL stage 0 essentially remaining stable.  #2 atrial fibrillation.  #3 thrombocytopenia secondary to #1 but they have improved   PLAN:   #1 patient will continue to be observed for his CLL no acute intervention is needed.  #2 I will plan on seeing the patient back in 3   All questions were answered. The patient knows to call the clinic with any problems, questions or concerns. We can certainly see the patient much sooner if necessary.  I spent 25 minutes counseling the patient face to face. The total time spent in the appointment was 30 minutes.    Drue Second, MD Medical/Oncology [facility] [phone] (beeper) [phone] (Office)  05/31/2011, 11:19 AM

## 2011-06-01 LAB — COMPREHENSIVE METABOLIC PANEL
AST: 27 U/L (ref 0–37)
Alkaline Phosphatase: 62 U/L (ref 39–117)
BUN: 13 mg/dL (ref 6–23)
Creatinine, Ser: 1.03 mg/dL (ref 0.50–1.35)
Total Bilirubin: 0.6 mg/dL (ref 0.3–1.2)

## 2011-06-01 LAB — DIRECT ANTIGLOBULIN TEST (NOT AT ARMC)
DAT (Complement): NEGATIVE
DAT IgG: NEGATIVE

## 2011-06-01 LAB — HAPTOGLOBIN: Haptoglobin: 88 mg/dL (ref 45–215)

## 2011-06-01 LAB — IGG, IGA, IGM: IgM, Serum: 41 mg/dL (ref 41–251)

## 2011-06-03 ENCOUNTER — Ambulatory Visit (HOSPITAL_COMMUNITY): Payer: Medicare Other | Admitting: Anesthesiology

## 2011-06-03 ENCOUNTER — Encounter (HOSPITAL_COMMUNITY): Payer: Self-pay | Admitting: Anesthesiology

## 2011-06-03 ENCOUNTER — Ambulatory Visit (HOSPITAL_COMMUNITY)
Admission: RE | Admit: 2011-06-03 | Discharge: 2011-06-03 | Disposition: A | Discharge status: Home / Self Care | Payer: Medicare Other | Source: Ambulatory Visit | Attending: Internal Medicine | Admitting: Internal Medicine

## 2011-06-03 ENCOUNTER — Encounter (HOSPITAL_COMMUNITY)
Admission: RE | Disposition: A | Discharge status: Home / Self Care | Payer: Self-pay | Source: Ambulatory Visit | Attending: Internal Medicine

## 2011-06-03 DIAGNOSIS — I255 Ischemic cardiomyopathy: Secondary | ICD-10-CM

## 2011-06-03 DIAGNOSIS — I4892 Unspecified atrial flutter: Secondary | ICD-10-CM | POA: Insufficient documentation

## 2011-06-03 DIAGNOSIS — C9110 Chronic lymphocytic leukemia of B-cell type not having achieved remission: Secondary | ICD-10-CM | POA: Diagnosis not present

## 2011-06-03 DIAGNOSIS — K219 Gastro-esophageal reflux disease without esophagitis: Secondary | ICD-10-CM | POA: Insufficient documentation

## 2011-06-03 DIAGNOSIS — I10 Essential (primary) hypertension: Secondary | ICD-10-CM | POA: Insufficient documentation

## 2011-06-03 DIAGNOSIS — I2510 Atherosclerotic heart disease of native coronary artery without angina pectoris: Secondary | ICD-10-CM | POA: Insufficient documentation

## 2011-06-03 DIAGNOSIS — Z951 Presence of aortocoronary bypass graft: Secondary | ICD-10-CM | POA: Diagnosis not present

## 2011-06-03 DIAGNOSIS — I252 Old myocardial infarction: Secondary | ICD-10-CM | POA: Diagnosis not present

## 2011-06-03 HISTORY — PX: CARDIOVERSION: SHX1299

## 2011-06-03 SURGERY — CARDIOVERSION
Anesthesia: Monitor Anesthesia Care | Wound class: Clean

## 2011-06-03 MED ORDER — PROPOFOL 10 MG/ML IV BOLUS
INTRAVENOUS | Status: DC | PRN
  Administered 2011-06-03: 100 mg via INTRAVENOUS

## 2011-06-03 MED ORDER — SODIUM CHLORIDE 0.9 % IV SOLN: INTRAVENOUS | Status: DC

## 2011-06-03 NOTE — Interval H&P Note (Signed)
History and Physical Interval Note:  06/03/2011 12:42 PM  Rodney Burns  has presented today for surgery, with the diagnosis of A FLUTTER  The various methods of treatment have been discussed with the patient and family. After consideration of risks, benefits and other options for treatment, the patient has consented to  Procedure(s) (LRB): CARDIOVERSION (N/A) as a surgical intervention .  The patients' history has been reviewed, patient examined, no change in status, stable for surgery.  I have reviewed the patients' chart and labs.  Questions were answered to the patient's satisfaction.     Sherryl Manges  Ate breakfast this am  Otherwise well

## 2011-06-03 NOTE — Anesthesia Postprocedure Evaluation (Signed)
  Anesthesia Post-op Note  Patient: Rodney Burns  Procedure(s) Performed: Procedure(s) (LRB): CARDIOVERSION (N/A)  Patient Location: PACU and Short Stay  Anesthesia Type: General  Level of Consciousness: awake  Airway and Oxygen Therapy: Patient Spontanous Breathing  Post-op Pain: none  Post-op Assessment: Post-op Vital signs reviewed, Patient's Cardiovascular Status Stable, Respiratory Function Stable, Patent Airway, No signs of Nausea or vomiting and Pain level controlled  Post-op Vital Signs: stable  Complications: No apparent anesthesia complications

## 2011-06-03 NOTE — Discharge Instructions (Signed)
Electrical Cardioversion Cardioversion is the delivery of a jolt of electricity to change the rhythm of the heart. Sticky patches or metal paddles are placed on the chest to deliver the electricity from a special device. This is done to restore a normal rhythm. A rhythm that is too fast or not regular keeps the heart from pumping well. Compared to medicines used to change an abnormal rhythm, cardioversion is faster and works better. It is also unpleasant and may dislodge blood clots from the heart. WHEN WOULD THIS BE DONE?  In an emergency:   There is low or no blood pressure as a result of the heart rhythm.   Normal rhythm must be restored as fast as possible to protect the brain and heart from further damage.   It may save a life.   For less serious heart rhythms, such as atrial fibrillation or flutter, in which:   The heart is beating too fast or is not regular.   The heart is still able to pump enough blood, but not as well as it should.   Medicine to change the rhythm has not worked.   It is safe to wait in order to allow time for preparation.  LET YOUR CAREGIVER KNOW ABOUT:   Every medicine you are taking. It is very important to do this! Know when to take or stop taking any of them.   Any time in the past that you have felt your heart was not beating normally.  RISKS AND COMPLICATIONS   Clots may form in the chambers of the heart if it is beating too fast. These clots may be dislodged during the procedure and travel to other parts of the body.   There is risk of a stroke during and after the procedure if a clot moves. Blood thinners lower this risk.   You may have a special test of your heart (TEE) to make sure there are no clots in your heart.  BEFORE THE PROCEDURE   You may have some tests to see how well your heart is working.   You may start taking blood thinners so your blood does not clot as easily.   Other drugs may be given to help your heart work better.   PROCEDURE (SCHEDULED)  The procedure is typically done in a hospital by a heart doctor (cardiologist).   You will be told when and where to go.   You may be given some medicine through an intravenous (IV) access to reduce discomfort and make you sleepy before the procedure.   Your whole body may move when the shock is delivered. Your chest may feel sore.   You may be able to go home after a few hours. Your heart rhythm will be watched to make sure it does not change.  HOME CARE INSTRUCTIONS   Only take medicine as directed by your caregiver. Be sure you understand how and when to take your medicine.   Learn how to feel your pulse and check it often.   Limit your activity for 48 hours.   Avoid caffeine and other stimulants as directed.  SEEK MEDICAL CARE IF:   You feel like your heart is beating too fast or your pulse is not regular.   You have any questions about your medicines.   You have bleeding that will not stop.  SEEK IMMEDIATE MEDICAL CARE IF:   You are dizzy or feel faint.   It is hard to breathe or you feel short of breath.     There is a change in discomfort in your chest.   Your speech is slurred or you have trouble moving your arm or leg on one side.   You get a muscle cramp.   Your fingers or toes turn cold or blue.  MAKE SURE YOU:   Understand these instructions.   Will watch your condition.   Will get help right away if you are not doing well or get worse.  Document Released: 12/28/2001 Document Revised: 12/27/2010 Document Reviewed: 04/29/2007 [employer] Patient Information 2012 [employer]. 

## 2011-06-03 NOTE — Anesthesia Postprocedure Evaluation (Signed)
  Anesthesia Post-op Note  Patient: Rodney Burns  Procedure(s) Performed: Procedure(s) (LRB): CARDIOVERSION (N/A)  Patient Location: Short Stay  Anesthesia Type: MAC  Level of Consciousness: awake, alert , oriented and patient cooperative  Airway and Oxygen Therapy: Patient Spontanous Breathing and Patient connected to nasal cannula oxygen  Post-op Pain: none  Post-op Assessment: Post-op Vital signs reviewed, Patient's Cardiovascular Status Stable, Respiratory Function Stable, Patent Airway and No signs of Nausea or vomiting  Post-op Vital Signs: Reviewed and stable  Complications: No apparent anesthesia complications

## 2011-06-03 NOTE — H&P (View-Only) (Signed)
HPI  Rodney Burns is a 65 y.o. male Who underwent mapping and ablation of what was thought to be atrial flutter that turned out to be a periannular tricuspid   atrial tachycardia. We had slowing prior to termination and non-reducibility.  He is feeling better at this point but still with some limitations  Past Medical History  Diagnosis Date  . Atrial flutter     Status post successful DC cardioversion in September of 2011; s/p RFCA 03/2011  . MI (myocardial infarction)     Remote anterior  . Coronary artery disease     Status post CABG in 1994  . CLL (chronic lymphoblastic leukemia)     Stage 0-1  . Hypercholesterolemia     Well Controlled  . Hypertension   . Cough     Consistant with ACE inhibitor mediated cough  . Malaria 1972    Hx of    Past Surgical History  Procedure Date  . Coronary artery bypass graft 1994    By Dr. Bartle. This included an LIMA graft to the left circumflex coronary, a sequential saphenous vein graft to the diagonal and the LAD, and a vein graft to the posterior descending and posterior lateral branches of the coronary artery.  . Us echocardiography 09-07-09    Est EF 40-45%    Current Outpatient Prescriptions  Medication Sig Dispense Refill  . dabigatran (PRADAXA) 150 MG CAPS Take 150 mg by mouth 2 (two) times daily.      . docusate sodium (COLACE) 100 MG capsule Take 100 mg by mouth 2 (two) times daily.      . ezetimibe-simvastatin (VYTORIN) 10-20 MG per tablet Take 1 tablet by mouth at bedtime.      . Fiber CHEW Chew 2 tablets by mouth every morning.      . latanoprost (XALATAN) 0.005 % ophthalmic solution Place 1 drop into both eyes at bedtime.       . loratadine (CLARITIN) 10 MG tablet Take 10 mg by mouth daily.      . losartan (COZAAR) 50 MG tablet Take 1 tablet (50 mg total) by mouth daily.  30 tablet  6  . metoprolol succinate (TOPROL-XL) 25 MG 24 hr tablet Take 1 tablet (25 mg total) by mouth daily.  90 tablet  3  . niacin (NIASPAN) 750  MG CR tablet Take 1,500 mg by mouth at bedtime.      . RABEprazole (ACIPHEX) 20 MG tablet Take 20 mg by mouth daily.        . traMADol (ULTRAM) 50 MG tablet Take 50 mg by mouth every 6 (six) hours as needed. For pain        Allergies  Allergen Reactions  . Ace Inhibitors     Cause a Cough  . Darvocet (Propoxacet-N)     Tolerates tylenol  . Penicillins     Review of Systems negative except from HPI and PMH  Physical Exam BP 110/64  Pulse 77  Ht 5' 11" (1.803 m)  Wt 221 lb 6.4 oz (100.426 kg)  BMI 30.88 kg/m2 Well developed and well nourished in no acute distress HENT normal E scleral and icterus clear Neck Supple JVP flutter wave ; carotids brisk and full Clear to ausculation Regular rate and rhythm, no murmurs gallops or rub Soft with active bowel sounds No clubbing cyanosis  Edema Alert and oriented, grossly normal motor and sensory function Skin Warm and Dry   ECG 3:1 Aflutter  This is different from rhtyhm identified   in march and also different from sept   Assessment and  Plan  

## 2011-06-03 NOTE — CV Procedure (Signed)
Preop Dx flutter 4:1 Post op DX  NSR  Procedure  DC Cardioversion   Pt was sedated by anesthesia receiving 100 mg Propafol  A synchronized shock 100 joules restored sinus Rhythm  Pt tolerated without difficulty

## 2011-06-03 NOTE — Transfer of Care (Signed)
Immediate Anesthesia Transfer of Care Note  Patient: Rodney Burns  Procedure(s) Performed: Procedure(s) (LRB): CARDIOVERSION (N/A)  Patient Location: Short Stay  Anesthesia Type: MAC  Level of Consciousness: awake, alert , oriented and patient cooperative  Airway & Oxygen Therapy: Patient Spontanous Breathing and Patient connected to nasal cannula oxygen  Post-op Assessment: Report given to PACU RN, Post -op Vital signs reviewed and stable and Patient moving all extremities  Post vital signs: Reviewed and stable  Complications: No apparent anesthesia complications

## 2011-06-03 NOTE — Preoperative (Signed)
Beta Blockers   Reason not to administer Beta Blockers:Not Applicable 

## 2011-06-03 NOTE — Anesthesia Preprocedure Evaluation (Addendum)
Anesthesia Evaluation  Patient identified by MRN, date of birth, ID band Patient awake    Reviewed: Allergy & Precautions, H&P , NPO status , Patient's Chart, lab work & pertinent test results, reviewed documented beta blocker date and time , Unable to perform ROS - Chart review only  Airway Mallampati: II TM Distance: >3 FB Neck ROM: Full    Dental  (+) Teeth Intact and Dental Advisory Given   Pulmonary neg pulmonary ROS,  breath sounds clear to auscultation  Pulmonary exam normal       Cardiovascular hypertension, Pt. on medications and Pt. on home beta blockers + CAD, + Past MI and + CABG + dysrhythmias Atrial Fibrillation Rhythm:Irregular Rate:Normal     Neuro/Psych negative neurological ROS  negative psych ROS   GI/Hepatic Neg liver ROS, GERD-  Medicated and Controlled,  Endo/Other  negative endocrine ROS  Renal/GU negative Renal ROS     Musculoskeletal negative musculoskeletal ROS (+)   Abdominal Normal abdominal exam  (+)   Peds  Hematology CLL, no chemo, stable per patient    Anesthesia Other Findings   Reproductive/Obstetrics                         Anesthesia Physical  Anesthesia Plan  ASA: III  Anesthesia Plan: General   Post-op Pain Management:    Induction: Intravenous  Airway Management Planned: Mask  Additional Equipment:   Intra-op Plan:   Post-operative Plan:   Informed Consent: I have reviewed the patients History and Physical, chart, labs and discussed the procedure including the risks, benefits and alternatives for the proposed anesthesia with the patient or authorized representative who has indicated his/her understanding and acceptance.   Dental advisory given  Plan Discussed with: CRNA and Surgeon  Anesthesia Plan Comments: (NPO 8am. Remommend delay until 2pm. 6hrs light meal elective proceedure)      Anesthesia Quick Evaluation

## 2011-06-04 ENCOUNTER — Telehealth: Payer: Self-pay | Admitting: Internal Medicine

## 2011-06-04 ENCOUNTER — Encounter (HOSPITAL_COMMUNITY): Payer: Self-pay | Admitting: Internal Medicine

## 2011-06-04 DIAGNOSIS — I4892 Unspecified atrial flutter: Secondary | ICD-10-CM

## 2011-06-04 MED ORDER — AMIODARONE HCL 200 MG PO TABS: ORAL_TABLET | ORAL | Status: DC

## 2011-06-04 NOTE — Telephone Encounter (Signed)
I spoke with the patient. He states his d/c instructions from yesterday stated amiodarone 400 mg BID x 2 weeks then 200 mg BID x 2 weeks. He wanted to know what he should be taking. He was already down to 200 mg BID when he went for DCCV on 5/13. I advised I would review with Dr. Graciela Husbands and call him back. Sherri Rad, RN, BSN

## 2011-06-04 NOTE — Telephone Encounter (Signed)
New msg Pt called and said he had cardioversion yesterday and had some questions. Please call

## 2011-06-04 NOTE — Telephone Encounter (Signed)
Per Dr. Graciela Husbands- stay on amiodarone 200 mg BID until his follow up appointment at the end of the month. The patient is aware. He needs a refill sent to [facility] in [place] on [address].

## 2011-06-20 ENCOUNTER — Encounter: Payer: Self-pay | Admitting: Internal Medicine

## 2011-06-20 ENCOUNTER — Ambulatory Visit (INDEPENDENT_AMBULATORY_CARE_PROVIDER_SITE_OTHER): Payer: Medicare Other | Admitting: Internal Medicine

## 2011-06-20 VITALS — BP 128/70 | HR 52 | Ht 71.0 in | Wt 216.1 lb

## 2011-06-20 DIAGNOSIS — I255 Ischemic cardiomyopathy: Secondary | ICD-10-CM

## 2011-06-20 DIAGNOSIS — G4730 Sleep apnea, unspecified: Secondary | ICD-10-CM

## 2011-06-20 DIAGNOSIS — I2589 Other forms of chronic ischemic heart disease: Secondary | ICD-10-CM | POA: Diagnosis not present

## 2011-06-20 DIAGNOSIS — I4892 Unspecified atrial flutter: Secondary | ICD-10-CM

## 2011-06-20 HISTORY — DX: Sleep apnea, unspecified: G47.30

## 2011-06-20 NOTE — Patient Instructions (Signed)
Your physician has recommended you make the following change in your medication:  1) Decrease amiodarone to 200 mg once daily.  You have been referred to : Dr. Johney Frame for evaluation of atypical a-flutter  Your physician has recommended that you have a sleep study. This test records several body functions during sleep, including: brain activity, eye movement, oxygen and carbon dioxide blood levels, heart rate and rhythm, breathing rate and rhythm, the flow of air through your mouth and nose, snoring, body muscle movements, and chest and belly movement.

## 2011-06-20 NOTE — Assessment & Plan Note (Signed)
He also significant sleep-disordered breathing and nocturnal apnea. Given his habitus and the aforementioned findings, we'll undertake a sleep study

## 2011-06-20 NOTE — Progress Notes (Signed)
HPI  Rodney Burns is a 66 y.o. male  Seen in followup for a periannuler atrial flutter initially ablated in September with subsequent recurrence. We discussed the role of amiodarone as a short term trial to see whether we could demonstrate improvement in symptoms related to the restoration of sinus rhythm with potential thereafter to pursue more complex ablation  He underwent cardioversion May 13  He is much improved following cardioversion. He is having no significant symptoms associated with his amiodarone. Past Medical History  Diagnosis Date  . Atrial flutter     Status post successful DC cardioversion in September of 2011; s/p RFCA 03/2011  . MI (myocardial infarction)     Remote anterior  . Coronary artery disease     Status post CABG in 1994  . CLL (chronic lymphoblastic leukemia)     Stage 0-1  . Hypercholesterolemia     Well Controlled  . Hypertension   . Cough     Consistant with ACE inhibitor mediated cough  . Malaria 1972    Hx of    Past Surgical History  Procedure Date  . Coronary artery bypass graft 1994    By Dr. Laneta Simmers. This included an LIMA graft to the left circumflex coronary, a sequential saphenous vein graft to the diagonal and the LAD, and a vein graft to the posterior descending and posterior lateral branches of the coronary artery.  . US echocardiography 09-07-09    Est EF 40-45%  . Cardioversion 06/03/2011    Procedure: CARDIOVERSION;  Surgeon: Duke Salvia, MD;  Location: [facility] OR;  Service: Cardiovascular;  Laterality: N/A;    Current Outpatient Prescriptions  Medication Sig Dispense Refill  . amiodarone (PACERONE) 200 MG tablet Take one tablet by mouth twice daily.  60 tablet  3  . dabigatran (PRADAXA) 150 MG CAPS Take 150 mg by mouth 2 (two) times daily.      [name] docusate sodium (COLACE) 100 MG capsule Take 100 mg by mouth 2 (two) times daily.      [name] ezetimibe-simvastatin (VYTORIN) 10-20 MG per tablet Take 1 tablet by mouth at bedtime.      . Fiber  CHEW Chew 2 tablets by mouth every morning.      . latanoprost (XALATAN) 0.005 % ophthalmic solution Place 1 drop into both eyes at bedtime.       [name] loratadine (CLARITIN) 10 MG tablet Take 10 mg by mouth daily.      [name] losartan (COZAAR) 50 MG tablet Take 1 tablet (50 mg total) by mouth daily.  30 tablet  6  . metoprolol succinate (TOPROL-XL) 25 MG 24 hr tablet Take 1 tablet (25 mg total) by mouth daily.  90 tablet  3  . niacin (NIASPAN) 750 MG CR tablet Take 1,500 mg by mouth at bedtime.      . RABEprazole (ACIPHEX) 20 MG tablet Take 20 mg by mouth daily.        . traMADol (ULTRAM) 50 MG tablet Take 50 mg by mouth every 6 (six) hours as needed. For pain        Allergies  Allergen Reactions  . Ace Inhibitors     Cause a Cough  . Darvocet (Propoxyphene-Acetaminophen)     Tolerates tylenol  . Penicillins     Review of Systems negative except from HPI and PMH  Physical Exam BP 128/70  Pulse 52  Ht 5\' 11"  (1.803 m)  Wt 216 lb 1.9 oz (98.031 kg)  BMI 30.14 kg/m2 Well developed  and well nourished in no acute distress HENT normal E scleral and icterus clear Neck Supple JVP flat; carotids brisk and full Clear to ausculation Regular rate and rhythm, no murmurs gallops or rub Soft with active bowel sounds No clubbing cyanosis none Edema Alert and oriented, grossly normal motor and sensory function Skin Warm and Dry  Sinus rhythm at 52 Intervals 20/11/47 Axis I 04 Inferior Q waves A poor R-wave progression  Assessment and  Plan

## 2011-06-20 NOTE — Assessment & Plan Note (Signed)
The patient had a micro-reentrant atrial tachycardia ablated from the tricuspid annulus. Unfortunately has had a recurrence. We treated him with amiodarone given coronary disease and undertook cardioversion to see if we could demonstrate improvement. This is the case symptomatically, and he would like to proceed with catheter ablation.  I will have him see Dr. Johney Frame for consideration of ablation of his atrial tachycardia/flutter to be done witH CARTO mapping

## 2011-06-20 NOTE — Assessment & Plan Note (Signed)
We'll continue him on his current medications. There is some concern about his modest left ventricular dysfunction potentially precluding him from chemotherapy in the setting of his chronic leukemia. He is hopeful that his LV function might improve. There may be a role for changing his metoprolol to carvedilol

## 2011-06-25 ENCOUNTER — Telehealth: Payer: Self-pay | Admitting: Internal Medicine

## 2011-06-25 NOTE — Telephone Encounter (Signed)
I spoke with [place]. She states she mailed some paperwork to the patient on Friday. His sleep study is on 07/11/11 at 8 pm. He is having paperwork coming in the mail.

## 2011-06-25 NOTE — Telephone Encounter (Signed)
The patient is aware of his sleep study date.

## 2011-06-25 NOTE — Telephone Encounter (Signed)
Pt calling re not hearing anything yet re sleep study, pls call

## 2011-06-27 DIAGNOSIS — H4011X0 Primary open-angle glaucoma, stage unspecified: Secondary | ICD-10-CM | POA: Diagnosis not present

## 2011-07-11 ENCOUNTER — Ambulatory Visit (HOSPITAL_BASED_OUTPATIENT_CLINIC_OR_DEPARTMENT_OTHER): Payer: Medicare Other | Attending: Internal Medicine

## 2011-07-11 VITALS — Ht 70.0 in | Wt 214.0 lb

## 2011-07-11 DIAGNOSIS — G4730 Sleep apnea, unspecified: Secondary | ICD-10-CM

## 2011-07-11 DIAGNOSIS — I4949 Other premature depolarization: Secondary | ICD-10-CM | POA: Insufficient documentation

## 2011-07-11 DIAGNOSIS — G4733 Obstructive sleep apnea (adult) (pediatric): Secondary | ICD-10-CM | POA: Diagnosis not present

## 2011-07-19 ENCOUNTER — Other Ambulatory Visit: Payer: Self-pay | Admitting: Cardiology

## 2011-07-19 MED ORDER — EZETIMIBE-SIMVASTATIN 10-20 MG PO TABS: 1.0000 | ORAL_TABLET | Freq: Every day | ORAL | Status: DC

## 2011-07-27 DIAGNOSIS — I4949 Other premature depolarization: Secondary | ICD-10-CM

## 2011-07-27 DIAGNOSIS — G4733 Obstructive sleep apnea (adult) (pediatric): Secondary | ICD-10-CM

## 2011-07-27 NOTE — Procedures (Signed)
NAME:  Rodney Burns, Rodney Burns NO.:  [id number]  MEDICAL RECORD NO.:  [id number]          PATIENT TYPE:  OUT  LOCATION:  SLEEP CENTER                 FACILITY:  [facility]  PHYSICIAN:  Barbaraann Share, MD,FCCPDATE OF BIRTH:  11-30-1944  DATE OF STUDY:  07/11/2011                           NOCTURNAL POLYSOMNOGRAM  REFERRING PHYSICIAN:  Duke Salvia, MD, [facility]  REFERRING PHYSICIAN:  Duke Salvia, MD, [facility]  INDICATIONS FOR STUDY:  Hypersomnia with sleep apnea.  EPWORTH SLEEPINESS SCORE:  5.  SLEEP ARCHITECTURE:  The patient had a total sleep time of 301 minutes with no slow-wave sleep and only 39 minutes of REM.  Sleep onset latency was normal at 18 minutes and REM onset was normal at 44 minutes.  Sleep efficiency was moderately reduced at 79%.  RESPIRATORY DATA:  The patient was found to have 8 apneas and 38 obstructive hypopneas, giving him an apnea-hypopnea index of 9 events per hour.  The events occurred primarily in supine and REM, and there was moderate snoring noted throughout.  The patient did not meet split night protocol secondary to his small numbers of events.  OXYGEN DATA:  There was O2 desaturation as low as 86% transiently with the patient's obstructive events.  CARDIAC DATA:  Occasional PVC noted as well as an occasional irregularity that appeared to be an atrial arrhythmia.   MOVEMENTS/PARASOMNIA:  The patient was found to have 246 limb movements with 5 per hour resulting in arousal or awakening.  There were no abnormal behaviors noted.  IMPRESSION/RECOMMENDATION: 1. Very mild obstructive sleep apnea/hypopnea syndrome with an AHI of     9 events per hour and oxygen desaturation as low as 86%     transiently.  Treatment for this degree of sleep apnea can include     a trial of weight loss alone, upper airway surgery, dental     appliance, and also CPAP.  Clinical correlation is suggested. 2. Occasional PVC as well as an occasional episode of  irregularity that     appeared to be an atrial arrhythmia. 3. Very large numbers of leg jerks with significant sleep disruption.     This is suspicious for a primary movement disorder of sleep,     however, clinical correlation is suggested.     Barbaraann Share, MD,FCCP Diplomate, American Board of Sleep Medicine    [facility]/MED[facility]  D:  07/27/2011 15:42:02  T:  07/27/2011 21:57:19  Job:  [id number]

## 2011-07-31 ENCOUNTER — Encounter: Payer: Self-pay | Admitting: *Deleted

## 2011-07-31 ENCOUNTER — Telehealth: Payer: Self-pay | Admitting: *Deleted

## 2011-07-31 NOTE — Telephone Encounter (Signed)
Due:  Mon July 29, 2011 9:59 PM      Rodney Burns     MRN:  [id number]  DOB:  26-Aug-1945     Pt Work:  [phone]  Pt Home:  [phone]              Message     Please Inform Patient   That sleep study was neg for sleep apnea If his restless legs is problem >>>PCP   Thanks    ----- Message -----   From: Barbaraann Share, MD   Sent: 07/29/2011 2:46 PM   To: Duke Salvia, MD      Brett Canales, wanted to let you know this pt's sleep study showed very mild osa with AHI 9/hr and transient desat to 86%   Also had very large numbers of leg jerks with sleep disruption. ?RLS.   Let us know if we can do anything to help.       Letter of results mailed to the patient.

## 2011-08-01 ENCOUNTER — Telehealth: Payer: Self-pay | Admitting: Internal Medicine

## 2011-08-01 NOTE — Telephone Encounter (Signed)
Pt  Would like sleep study results, pls call

## 2011-08-01 NOTE — Telephone Encounter (Signed)
Pt was notified of sleep study results as per Dr Odessa Fleming instructions.

## 2011-08-07 ENCOUNTER — Encounter: Payer: Self-pay | Admitting: Internal Medicine

## 2011-08-07 ENCOUNTER — Encounter: Payer: Self-pay | Admitting: *Deleted

## 2011-08-07 ENCOUNTER — Ambulatory Visit (INDEPENDENT_AMBULATORY_CARE_PROVIDER_SITE_OTHER): Payer: Medicare Other | Admitting: Internal Medicine

## 2011-08-07 VITALS — BP 120/70 | HR 48 | Resp 18 | Ht 70.0 in | Wt 216.0 lb

## 2011-08-07 DIAGNOSIS — I4892 Unspecified atrial flutter: Secondary | ICD-10-CM | POA: Diagnosis not present

## 2011-08-07 DIAGNOSIS — I255 Ischemic cardiomyopathy: Secondary | ICD-10-CM

## 2011-08-07 DIAGNOSIS — I10 Essential (primary) hypertension: Secondary | ICD-10-CM | POA: Diagnosis not present

## 2011-08-07 DIAGNOSIS — I2589 Other forms of chronic ischemic heart disease: Secondary | ICD-10-CM

## 2011-08-07 NOTE — Progress Notes (Signed)
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 male with a h/o atypical atrial flutter who presents today to discuss repeat ablation.  He has been followed closely by Dr Graciela Husbands.  He was initially identified as having atrial flutter September 2011 for which he was cardioverted.  He was then found earlier this year to be in atrial flutter again with a different flutter morphology. This one also was somewhat atypical but close to typical particularly with negative flutter waves in the inferior leads.  He was started on Pradaxa and underwent EP study by Dr Graciela Husbands 03/2011.  His tachycardia was felt to be atrial tachycardia arising from near the tricuspid valve.  This was ablated and rendered non inducible.  Unfortunately, he returned 4/13 with a slightly different morphology atrial flutter.  He was cardioverted and placed on amiodarone.  He has been in sinus rhythm since that time.    Today, he denies symptoms of palpitations, chest pain, shortness of breath, orthopnea, PND, lower extremity edema, dizziness, presyncope, syncope, or neurologic sequela. The patient is tolerating medications without difficulties and is otherwise without complaint today.   Past Medical History  Diagnosis Date  . Atrial flutter     Status post successful DC cardioversion in September of 2011; s/p RFCA 03/2011  . MI (myocardial infarction)     Remote anterior  . Coronary artery disease     Status post CABG in 1994  . CLL (chronic lymphoblastic leukemia)     Stage 0-1  . Hypercholesterolemia     Well Controlled  . Hypertension   . Cough     Consistant with ACE inhibitor mediated cough  . Malaria 1972    Hx of  . Sleep-disordered breathing 06/20/2011   Past Surgical History  Procedure Date  . Coronary artery bypass graft 1994    By Dr. Laneta Simmers. This included an LIMA graft to the left circumflex coronary, a sequential saphenous vein graft to the diagonal and  the LAD, and a vein graft to the posterior descending and posterior lateral branches of the coronary artery.  . US echocardiography 09-07-09    Est EF 40-45%  . Cardioversion 06/03/2011    Procedure: CARDIOVERSION;  Surgeon: Duke Salvia, MD;  Location: [facility] OR;  Service: Cardiovascular;  Laterality: N/A;  . Ep study and ablation 2013    by Dr Graciela Husbands    Current Outpatient Prescriptions  Medication Sig Dispense Refill  . dabigatran (PRADAXA) 150 MG CAPS Take 150 mg by mouth 2 (two) times daily.      [name] docusate sodium (COLACE) 100 MG capsule Take 100 mg by mouth 2 (two) times daily.      [name] ezetimibe-simvastatin (VYTORIN) 10-20 MG per tablet Take 1 tablet by mouth at bedtime.  90 tablet  3  . Fiber CHEW Chew 2 tablets by mouth every morning.      . latanoprost (XALATAN) 0.005 % ophthalmic solution Place 1 drop into both eyes at bedtime.       [name] loratadine (CLARITIN) 10 MG tablet Take 10 mg by mouth daily.      [name] losartan (COZAAR) 50 MG tablet Take 1 tablet (50 mg total) by mouth daily.  30 tablet  6  . metoprolol succinate (TOPROL-XL) 25 MG 24 hr tablet Take 1 tablet (25 mg total) by mouth daily.  90 tablet  3  . niacin (NIASPAN) 750 MG CR tablet Take 1,500 mg by mouth at  bedtime.      . RABEprazole (ACIPHEX) 20 MG tablet Take 20 mg by mouth daily.        . traMADol (ULTRAM) 50 MG tablet Take 50 mg by mouth every 6 (six) hours as needed. For pain        Allergies  Allergen Reactions  . Ace Inhibitors     Cause a Cough  . Darvocet (Propoxyphene-Acetaminophen)     Tolerates tylenol  . Penicillins     History   Social History  . Marital Status: Married    Spouse Name: N/A    Number of Children: 2  . Years of Education: N/A   Occupational History  . Transport planner    Social History Main Topics  . Smoking status: Former Smoker    Quit date: 01/22/1992  . Smokeless tobacco: Not on file  . Alcohol Use: No  . Drug Use:   . Sexually Active: Yes   Other Topics Concern  . Not on  file   Social History Narrative  . No narrative on file    No family history on file.  ROS- All systems are reviewed and negative except as per the HPI above  Physical Exam: There were no vitals filed for this visit.  GEN- The patient is well appearing, alert and oriented x 3 today.   Head- normocephalic, atraumatic Eyes-  Sclera clear, conjunctiva pink Ears- hearing intact Oropharynx- clear Neck- supple, no JVP Lymph- no cervical lymphadenopathy Lungs- Clear to ausculation bilaterally, normal work of breathing Heart- Regular rate and rhythm, no murmurs, rubs or gallops, PMI not laterally displaced GI- soft, NT, ND, + BS Extremities- no clubbing, cyanosis, or edema MS- no significant deformity or atrophy Skin- no rash or lesion Psych- euthymic mood, full affect Neuro- strength and sensation are intact  EKG today reveals sinus bradycardia 48 bpm, PR 198, QRS 110, Qtc 400, otherwise normal ekg  Assessment and Plan:

## 2011-08-07 NOTE — Assessment & Plan Note (Signed)
Stable No change required today  

## 2011-08-07 NOTE — Assessment & Plan Note (Signed)
Continue medical management No changes today

## 2011-08-07 NOTE — Assessment & Plan Note (Signed)
The patient has recurrent atrial flutter.  Prior EPS 3/13 by Dr Graciela Husbands is reviewed and revealed right atrial tachycardia at that time.  He underwent ablation and was non inducible and the end of the study.  He developed recurrent atrial flutter 4/13 but with a slightly different morphology.  He was cardioverted and placed on amiodarone.  Though he has done quite well on amiodarone, I agree with Dr Graciela Husbands that we should try to again ablate his atrial flutter to hopefully be able to discontinue amiodarone and anticoagulation long term which both pose risks of their own.  Therapeutic strategies for atrial arrhythmias including medicine and ablation were discussed in detail with the patient today. Risk, benefits, and alternatives to EP study and radiofrequency ablation  were also discussed in detail today. These risks include but are not limited to stroke, bleeding, vascular damage, tamponade, perforation, damage to the heart and other structures, AV block requiring PPM, worsening renal function, and death. The patient understands these risk and wishes to proceed.  I will stop amiodarone today.  I would like for this to wash out for several weeks before we proceed with ablation.  The patient has vacation plans in early August and requests that we schedule his procedure for late august.  We will therefore proceed with catheter ablation at that time.  He should continue pradaxa in the interim.  We will use Carto and anesthesia for this procedure.

## 2011-08-07 NOTE — Patient Instructions (Addendum)
Your physician has recommended that you have an ablation. Catheter ablation is a medical procedure used to treat some cardiac arrhythmias (irregular heartbeats). During catheter ablation, a long, thin, flexible tube is put into a blood vessel in your groin (upper thigh), or neck. This tube is called an ablation catheter. It is then guided to your heart through the blood vessel. Radio frequency waves destroy small areas of heart tissue where abnormal heartbeats may cause an arrhythmia to start. Please see the instruction sheet given to you today.  09/17/11 at 7:30am  See instruction sheet    Your physician has recommended you make the following change in your medication:  1) Stop Amiodarone

## 2011-08-08 ENCOUNTER — Other Ambulatory Visit: Payer: Self-pay | Admitting: *Deleted

## 2011-08-08 ENCOUNTER — Encounter: Payer: Self-pay | Admitting: *Deleted

## 2011-08-08 DIAGNOSIS — I4892 Unspecified atrial flutter: Secondary | ICD-10-CM

## 2011-08-09 ENCOUNTER — Other Ambulatory Visit: Payer: Self-pay | Admitting: Oncology

## 2011-08-20 DIAGNOSIS — L0390 Cellulitis, unspecified: Secondary | ICD-10-CM | POA: Diagnosis not present

## 2011-08-20 DIAGNOSIS — G2589 Other specified extrapyramidal and movement disorders: Secondary | ICD-10-CM | POA: Diagnosis not present

## 2011-08-27 NOTE — Addendum Note (Signed)
Addended by: Lacie Scotts on: 08/27/2011 04:15 PM   Modules accepted: Orders

## 2011-09-12 ENCOUNTER — Other Ambulatory Visit: Payer: Medicare Other | Admitting: Lab

## 2011-09-12 ENCOUNTER — Encounter: Payer: Self-pay | Admitting: Oncology

## 2011-09-12 ENCOUNTER — Telehealth: Payer: Self-pay | Admitting: *Deleted

## 2011-09-12 ENCOUNTER — Ambulatory Visit (HOSPITAL_BASED_OUTPATIENT_CLINIC_OR_DEPARTMENT_OTHER): Payer: Medicare Other | Admitting: Oncology

## 2011-09-12 VITALS — BP 123/75 | HR 54 | Temp 98.0°F | Resp 20 | Ht 70.0 in | Wt 222.8 lb

## 2011-09-12 DIAGNOSIS — I4891 Unspecified atrial fibrillation: Secondary | ICD-10-CM | POA: Diagnosis not present

## 2011-09-12 DIAGNOSIS — D6959 Other secondary thrombocytopenia: Secondary | ICD-10-CM | POA: Diagnosis not present

## 2011-09-12 DIAGNOSIS — D696 Thrombocytopenia, unspecified: Secondary | ICD-10-CM | POA: Diagnosis not present

## 2011-09-12 DIAGNOSIS — C9110 Chronic lymphocytic leukemia of B-cell type not having achieved remission: Secondary | ICD-10-CM

## 2011-09-12 LAB — CBC WITH DIFFERENTIAL/PLATELET
Basophils Absolute: 0 10*3/uL (ref 0.0–0.1)
EOS%: 0.7 % (ref 0.0–7.0)
Eosinophils Absolute: 0.1 10*3/uL (ref 0.0–0.5)
HCT: 42.7 % (ref 38.4–49.9)
HGB: 13.7 g/dL (ref 13.0–17.1)
LYMPH%: 75.7 % — ABNORMAL HIGH (ref 14.0–49.0)
MCH: 28 pg (ref 27.2–33.4)
MCV: 86.9 fL (ref 79.3–98.0)
MONO%: 2.6 % (ref 0.0–14.0)
NEUT#: 3.5 10*3/uL (ref 1.5–6.5)
NEUT%: 20.8 % — ABNORMAL LOW (ref 39.0–75.0)
Platelets: 101 10*3/uL — ABNORMAL LOW (ref 140–400)
RDW: 14.4 % (ref 11.0–14.6)

## 2011-09-12 LAB — TECHNOLOGIST REVIEW

## 2011-09-12 NOTE — Patient Instructions (Addendum)
Doing  Well  I will see you back in 3 months

## 2011-09-12 NOTE — Progress Notes (Signed)
OFFICE PROGRESS NOTE  CC  Rodney Hillier, MD [address] [place] [address]  DIAGNOSIS: is 66 year old gentleman with stage 0 CLL  PRIOR THERAPY:observation  CURRENT THERAPY:observation  INTERVAL HISTORY: Rodney Burns 66 y.o. male returns for followup visit. Overall he feels well. He does continue to have atrial flutter/ fibrillation. Otherwise he is feeling well he has not had any fevers chills night sweats any infections no recent pneumonias or bronchitis. He denies having any bleeding problems no hematuria hematochezia melena hemoptysis or hematemesis. Remainder of the 10 point review of systems is negative.[name] MEDICAL HISTORY: Past Medical History  Diagnosis Date  . Atrial flutter     Status post successful DC cardioversion in September of 2011; s/p RFCA 03/2011  . MI (myocardial infarction)     Remote anterior  . Coronary artery disease     Status post CABG in 1994  . CLL (chronic lymphoblastic leukemia)     Stage 0-1  . Hypercholesterolemia     Well Controlled  . Hypertension   . Cough     Consistant with ACE inhibitor mediated cough  . Malaria 1972    Hx of  . Sleep-disordered breathing 06/20/2011    ALLERGIES:  is allergic to penicillins; ace inhibitors; darvocet; and sulfa antibiotics.  MEDICATIONS:  Current Outpatient Prescriptions  Medication Sig Dispense Refill  . dabigatran (PRADAXA) 150 MG CAPS Take 150 mg by mouth 2 (two) times daily.      [name] docusate sodium (COLACE) 100 MG capsule Take 100 mg by mouth 2 (two) times daily.      [name] ezetimibe-simvastatin (VYTORIN) 10-20 MG per tablet Take 1 tablet by mouth at bedtime.  90 tablet  3  . Fiber CHEW Chew 2 tablets by mouth every morning.      . latanoprost (XALATAN) 0.005 % ophthalmic solution Place 1 drop into both eyes at bedtime.       . lidocaine (LIDODERM) 5 % Place 1 patch onto the skin daily as needed. Remove & Discard patch within 12 hours or as directed by MD, for left shoulder pain      .  losartan (COZAAR) 50 MG tablet Take 1 tablet (50 mg total) by mouth daily.  30 tablet  6  . metoprolol succinate (TOPROL-XL) 25 MG 24 hr tablet Take 1 tablet (25 mg total) by mouth daily.  90 tablet  3  . niacin (NIASPAN) 750 MG CR tablet Take 1,500 mg by mouth at bedtime.      . pramipexole (MIRAPEX) 0.125 MG tablet Take 0.125 mg by mouth daily as needed. For legs      . RABEprazole (ACIPHEX) 20 MG tablet Take 20 mg by mouth daily.        . traMADol (ULTRAM) 50 MG tablet Take 50 mg by mouth 3 (three) times daily as needed. For pain        SURGICAL HISTORY:  Past Surgical History  Procedure Date  . Coronary artery bypass graft 1994    By Dr. Laneta Simmers. This included an LIMA graft to the left circumflex coronary, a sequential saphenous vein graft to the diagonal and the LAD, and a vein graft to the posterior descending and posterior lateral branches of the coronary artery.  . US echocardiography 09-07-09    Est EF 40-45%  . Cardioversion 06/03/2011    Procedure: CARDIOVERSION;  Surgeon: Duke Salvia, MD;  Location: [facility] OR;  Service: Cardiovascular;  Laterality: N/A;  . Ep study and ablation 2013  by Dr Graciela Husbands    REVIEW OF SYSTEMS:  Pertinent items are noted in HPI.   PHYSICAL EXAMINATION: General appearance: alert, cooperative and appears stated age Neck: no adenopathy, no carotid bruit, no JVD, supple, symmetrical, trachea midline and thyroid not enlarged, symmetric, no tenderness/mass/nodules Lymph nodes: Cervical, supraclavicular, and axillary nodes normal. Resp: clear to auscultation bilaterally and normal percussion bilaterally Back: negative, symmetric, no curvature. ROM normal. No CVA tenderness. Cardio: irregularly irregular rhythm GI: soft, non-tender; bowel sounds normal; no masses,  no organomegaly Extremities: extremities normal, atraumatic, no cyanosis or edema Neurologic: Alert and oriented X 3, normal strength and tone. Normal symmetric reflexes. Normal coordination and  gait  ECOG PERFORMANCE STATUS: 0 - Asymptomatic  Blood pressure 123/75, pulse 54, temperature 98 F (36.7 C), temperature source Oral, resp. rate 20, height 5\' 10"  (1.778 m), weight 222 lb 12.8 oz (101.061 kg).  LABORATORY DATA: Lab Results  Component Value Date   WBC 17.1* 09/12/2011   HGB 13.7 09/12/2011   HCT 42.7 09/12/2011   MCV 86.9 09/12/2011   PLT 101* 09/12/2011      Chemistry      Component Value Date/Time   NA 139 05/31/2011 1047   K 4.2 05/31/2011 1047   CL 106 05/31/2011 1047   CO2 25 05/31/2011 1047   BUN 13 05/31/2011 1047   CREATININE 1.03 05/31/2011 1047      Component Value Date/Time   CALCIUM 8.8 05/31/2011 1047   ALKPHOS 62 05/31/2011 1047   AST 27 05/31/2011 1047   ALT 31 05/31/2011 1047   BILITOT 0.6 05/31/2011 1047       RADIOGRAPHIC STUDIES:  No results found.  ASSESSMENT: 66 year old gentleman with  #1 CLL stage 0 essentially remaining stable.  #2 atrial fibrillation.  #3 thrombocytopenia secondary to #1We will continue to monitor this.  PLAN:   #1 patient will continue to be observed for his CLL no acute intervention is needed.  #2 I will plan on seeing the patient back in 3 - 4 Months time   All questions were answered. The patient knows to call the clinic with any problems, questions or concerns. We can certainly see the patient much sooner if necessary.  I spent 25 minutes counseling the patient face to face. The total time spent in the appointment was 30 minutes.    Drue Second, MD Medical/Oncology [facility] [phone] (beeper) [phone] (Office)  09/12/2011, 9:52 AM

## 2011-09-12 NOTE — Telephone Encounter (Signed)
Gave patient appointment for 12-13-2011 starting at 8:30 am

## 2011-09-13 LAB — COMPREHENSIVE METABOLIC PANEL
AST: 23 U/L (ref 0–37)
Albumin: 4.1 g/dL (ref 3.5–5.2)
Alkaline Phosphatase: 41 U/L (ref 39–117)
BUN: 11 mg/dL (ref 6–23)
Creatinine, Ser: 1.05 mg/dL (ref 0.50–1.35)
Glucose, Bld: 125 mg/dL — ABNORMAL HIGH (ref 70–99)

## 2011-09-13 LAB — IGM: IgM, Serum: 33 mg/dL — ABNORMAL LOW (ref 41–251)

## 2011-09-13 LAB — IGA: IgA: 143 mg/dL (ref 68–379)

## 2011-09-17 ENCOUNTER — Ambulatory Visit (HOSPITAL_COMMUNITY)
Admission: RE | Admit: 2011-09-17 | Discharge: 2011-09-17 | Disposition: A | Discharge status: Home / Self Care | Payer: Medicare Other | Source: Ambulatory Visit | Attending: Internal Medicine | Admitting: Internal Medicine

## 2011-09-17 ENCOUNTER — Encounter (HOSPITAL_COMMUNITY): Payer: Self-pay | Admitting: Anesthesiology

## 2011-09-17 ENCOUNTER — Encounter (HOSPITAL_COMMUNITY): Payer: Self-pay | Admitting: Cardiology

## 2011-09-17 ENCOUNTER — Encounter (HOSPITAL_COMMUNITY)
Admission: RE | Disposition: A | Discharge status: Home / Self Care | Payer: Self-pay | Source: Ambulatory Visit | Attending: Internal Medicine

## 2011-09-17 ENCOUNTER — Ambulatory Visit (HOSPITAL_COMMUNITY): Payer: Medicare Other | Admitting: Anesthesiology

## 2011-09-17 DIAGNOSIS — I2510 Atherosclerotic heart disease of native coronary artery without angina pectoris: Secondary | ICD-10-CM | POA: Insufficient documentation

## 2011-09-17 DIAGNOSIS — I4891 Unspecified atrial fibrillation: Secondary | ICD-10-CM

## 2011-09-17 DIAGNOSIS — E785 Hyperlipidemia, unspecified: Secondary | ICD-10-CM | POA: Diagnosis not present

## 2011-09-17 DIAGNOSIS — I498 Other specified cardiac arrhythmias: Secondary | ICD-10-CM | POA: Insufficient documentation

## 2011-09-17 DIAGNOSIS — R0989 Other specified symptoms and signs involving the circulatory and respiratory systems: Secondary | ICD-10-CM | POA: Diagnosis not present

## 2011-09-17 DIAGNOSIS — Z79899 Other long term (current) drug therapy: Secondary | ICD-10-CM | POA: Diagnosis not present

## 2011-09-17 DIAGNOSIS — I4892 Unspecified atrial flutter: Secondary | ICD-10-CM | POA: Diagnosis not present

## 2011-09-17 DIAGNOSIS — Z7901 Long term (current) use of anticoagulants: Secondary | ICD-10-CM | POA: Insufficient documentation

## 2011-09-17 DIAGNOSIS — I471 Supraventricular tachycardia: Secondary | ICD-10-CM

## 2011-09-17 DIAGNOSIS — I10 Essential (primary) hypertension: Secondary | ICD-10-CM | POA: Insufficient documentation

## 2011-09-17 DIAGNOSIS — C9110 Chronic lymphocytic leukemia of B-cell type not having achieved remission: Secondary | ICD-10-CM | POA: Diagnosis not present

## 2011-09-17 DIAGNOSIS — R0609 Other forms of dyspnea: Secondary | ICD-10-CM | POA: Insufficient documentation

## 2011-09-17 HISTORY — DX: Personal history of nicotine dependence: Z87.891

## 2011-09-17 HISTORY — DX: Supraventricular tachycardia: I47.1

## 2011-09-17 HISTORY — PX: ATRIAL FLUTTER ABLATION: SHX5733

## 2011-09-17 HISTORY — DX: Other supraventricular tachycardia: I47.19

## 2011-09-17 SURGERY — ATRIAL FLUTTER ABLATION
Anesthesia: Monitor Anesthesia Care

## 2011-09-17 MED ORDER — METOPROLOL SUCCINATE ER 25 MG PO TB24
25.0000 mg | ORAL_TABLET | Freq: Every day | ORAL | Status: DC
  Filled 2011-09-17: qty 1

## 2011-09-17 MED ORDER — HEPARIN SODIUM (PORCINE) 1000 UNIT/ML IJ SOLN
INTRAMUSCULAR | Status: AC
  Filled 2011-09-17: qty 1

## 2011-09-17 MED ORDER — DABIGATRAN ETEXILATE MESYLATE 150 MG PO CAPS
150.0000 mg | ORAL_CAPSULE | Freq: Two times a day (BID) | ORAL | Status: DC
  Filled 2011-09-17: qty 1

## 2011-09-17 MED ORDER — SODIUM CHLORIDE 0.9 % IJ SOLN: 3.0000 mL | Freq: Two times a day (BID) | INTRAMUSCULAR | Status: DC

## 2011-09-17 MED ORDER — LIDOCAINE HCL (CARDIAC) 20 MG/ML IV SOLN
INTRAVENOUS | Status: DC | PRN
  Administered 2011-09-17: 20 mg via INTRAVENOUS

## 2011-09-17 MED ORDER — TRAMADOL HCL 50 MG PO TABS: 50.0000 mg | ORAL_TABLET | Freq: Three times a day (TID) | ORAL | Status: DC | PRN

## 2011-09-17 MED ORDER — ALBUMIN HUMAN 5 % IV SOLN
INTRAVENOUS | Status: DC | PRN
  Administered 2011-09-17: 09:00:00 via INTRAVENOUS

## 2011-09-17 MED ORDER — EZETIMIBE-SIMVASTATIN 10-20 MG PO TABS
1.0000 | ORAL_TABLET | Freq: Every day | ORAL | Status: DC
  Filled 2011-09-17: qty 1

## 2011-09-17 MED ORDER — SODIUM CHLORIDE 0.9 % IJ SOLN: 3.0000 mL | INTRAMUSCULAR | Status: DC | PRN

## 2011-09-17 MED ORDER — SODIUM CHLORIDE 0.9 % IV SOLN: 250.0000 mL | INTRAVENOUS | Status: DC | PRN

## 2011-09-17 MED ORDER — HYDROXYUREA 500 MG PO CAPS
ORAL_CAPSULE | ORAL | Status: AC
  Filled 2011-09-17: qty 1

## 2011-09-17 MED ORDER — EPHEDRINE SULFATE 50 MG/ML IJ SOLN
INTRAMUSCULAR | Status: DC | PRN
  Administered 2011-09-17: 5 mg via INTRAVENOUS
  Administered 2011-09-17: 10 mg via INTRAVENOUS

## 2011-09-17 MED ORDER — ACETAMINOPHEN 325 MG PO TABS: 650.0000 mg | ORAL_TABLET | ORAL | Status: DC | PRN

## 2011-09-17 MED ORDER — LOSARTAN POTASSIUM 50 MG PO TABS
50.0000 mg | ORAL_TABLET | Freq: Every day | ORAL | Status: DC
  Filled 2011-09-17: qty 1

## 2011-09-17 MED ORDER — HYDROCODONE-ACETAMINOPHEN 5-325 MG PO TABS: 1.0000 | ORAL_TABLET | ORAL | Status: DC | PRN

## 2011-09-17 MED ORDER — FENTANYL CITRATE 0.05 MG/ML IJ SOLN
INTRAMUSCULAR | Status: DC | PRN
  Administered 2011-09-17 (×2): 50 ug via INTRAVENOUS

## 2011-09-17 MED ORDER — LATANOPROST 0.005 % OP SOLN
1.0000 [drp] | Freq: Every day | OPHTHALMIC | Status: DC
  Filled 2011-09-17: qty 2.5

## 2011-09-17 MED ORDER — DOCUSATE SODIUM 100 MG PO CAPS
100.0000 mg | ORAL_CAPSULE | Freq: Two times a day (BID) | ORAL | Status: DC
  Filled 2011-09-17 (×2): qty 1

## 2011-09-17 MED ORDER — ONDANSETRON HCL 4 MG/2ML IJ SOLN: 4.0000 mg | Freq: Four times a day (QID) | INTRAMUSCULAR | Status: DC | PRN

## 2011-09-17 MED ORDER — PANTOPRAZOLE SODIUM 40 MG PO TBEC: 40.0000 mg | DELAYED_RELEASE_TABLET | Freq: Every day | ORAL | Status: DC

## 2011-09-17 MED ORDER — LACTATED RINGERS IV SOLN
INTRAVENOUS | Status: DC | PRN
  Administered 2011-09-17 (×2): via INTRAVENOUS

## 2011-09-17 MED ORDER — BUPIVACAINE HCL (PF) 0.25 % IJ SOLN
INTRAMUSCULAR | Status: AC
  Filled 2011-09-17: qty 60

## 2011-09-17 MED ORDER — MIDAZOLAM HCL 5 MG/5ML IJ SOLN
INTRAMUSCULAR | Status: DC | PRN
  Administered 2011-09-17 (×2): 1 mg via INTRAVENOUS

## 2011-09-17 MED ORDER — PROPOFOL 10 MG/ML IV EMUL
INTRAVENOUS | Status: DC | PRN
  Administered 2011-09-17: 10 mg via INTRAVENOUS
  Administered 2011-09-17: 140 mg via INTRAVENOUS
  Administered 2011-09-17: 10 mg via INTRAVENOUS

## 2011-09-17 MED ORDER — ISOPROTERENOL HCL 0.2 MG/ML IJ SOLN
1000.0000 ug | INTRAVENOUS | Status: DC | PRN
  Administered 2011-09-17: 2 ug/min via INTRAVENOUS

## 2011-09-17 MED ORDER — DEXTROSE 5 % IV SOLN
INTRAVENOUS | Status: AC
  Filled 2011-09-17: qty 250

## 2011-09-17 NOTE — Discharge Summary (Signed)
Discharge Summary   Patient ID: Rodney Burns MRN: [id number], DOB/AGE: 1945/10/26 66 y.o.  Primary MD: [name], MD Primary Cardiologist: Dr. Thomasene Lot. Johney Frame Admit date: 09/17/2011 D/C date:     09/17/2011      Primary Discharge Diagnoses:  1. Atrial Tachycardia  - Diagnosed w/ A.flutter September 2011 s/p DCCV, s/p ablation 03/2011, s/p DCCV 04/2011  - Anticoagulated with Pradaxa  - S/p ablation 09/17/11  Secondary Discharge Diagnoses:  1. Coronary Artery Disease s/p CABG 1994 2. Hyperlipidemia 3. Hypertension 4. Chronic lymphoblastic leukemia Stage 0-1  5. H/o Malaria 1972 6. Sleep-disordered breathing 7. H/o tobacco abuse - quit 1994   Allergies Allergies  Allergen Reactions  . Penicillins Other (See Comments)    Put pt in hospital 50 yrs ago  . Ace Inhibitors     Cause a Cough  . Darvocet (Propoxyphene-Acetaminophen) Rash    Tolerates tylenol  . Sulfa Antibiotics Rash    Diagnostic Studies/Procedures:   09/17/11 - RFCA  History of Present Illness: 66 y.o. male w/ the above medical problems who presented to [facility] on 09/17/11 for planned catheter ablation of atrial tachycardia.  He has been followed closely by Dr Graciela Husbands. He was initially identified as having atrial flutter September 2011 for which he was cardioverted. He was then found earlier this year to be in atrial flutter again with a different flutter morphology. This one also was somewhat atypical but close to typical particularly with negative flutter waves in the inferior leads. He was started on Pradaxa and underwent EP study by Dr Graciela Husbands 03/2011. His tachycardia was felt to be atrial tachycardia arising from near the tricuspid valve. This was ablated and rendered non inducible. Unfortunately, he returned 4/13 with a slightly different morphology atrial flutter. He was cardioverted and placed on amiodarone. He has been in sinus rhythm since that time.   Hospital Course: He presented to [facility] on 09/17/11 and underwent radiofrequency catheter ablation of his atrial tachycardia. See full operative note for full details (not available at time of discharge). He tolerated the procedure well without complications. He was seen and evaluated by Dr. Johney Frame who felt he was stable for discharge home with plans for follow up as scheduled below.  Discharge Vitals: Blood pressure 140/76, pulse 63, temperature 97.4 F (36.3 C), temperature source Oral, resp. rate 18, height 5\' 10"  (1.778 m), weight 215 lb (97.523 kg), SpO2 93.00%.  Labs: None  Discharge Medications   Medication List  As of 09/17/2011  6:46 PM   TAKE these medications         dabigatran 150 MG Caps   Commonly known as: PRADAXA   Take 150 mg by mouth 2 (two) times daily.      docusate sodium 100 MG capsule   Commonly known as: COLACE   Take 100 mg by mouth 2 (two) times daily.      ezetimibe-simvastatin 10-20 MG per tablet   Commonly known as: VYTORIN   Take 1 tablet by mouth at bedtime.      Fiber Chew   Chew 2 tablets by mouth every morning.      latanoprost 0.005 % ophthalmic solution   Commonly known as: XALATAN   Place 1 drop into both eyes at bedtime.      lidocaine 5 %   Commonly known as: LIDODERM   Place 1 patch onto the skin daily as needed. Remove & Discard patch within 12 hours or as directed by MD, for  left shoulder pain      losartan 50 MG tablet   Commonly known as: COZAAR   Take 1 tablet (50 mg total) by mouth daily.      metoprolol succinate 25 MG 24 hr tablet   Commonly known as: TOPROL-XL   Take 1 tablet (25 mg total) by mouth daily.      niacin 750 MG CR tablet   Commonly known as: NIASPAN   Take 1,500 mg by mouth at bedtime.      pramipexole 0.125 MG tablet   Commonly known as: MIRAPEX   Take 0.125 mg by mouth daily as needed. For legs      RABEprazole 20 MG tablet   Commonly known as: ACIPHEX   Take 20 mg by mouth daily.      traMADol 50 MG tablet   Commonly known  as: ULTRAM   Take 50 mg by mouth 3 (three) times daily as needed. For pain            Disposition   Discharge Orders    Future Appointments: Provider: Department: Dept Phone: Center:   12/13/2011 8:30 AM Windell Hummingbird [facility] [phone] None   12/13/2011 9:00 AM Victorino December, MD [facility] [phone] None     Future Orders Please Complete By Expires   Diet - low sodium heart healthy      Increase activity slowly      Discharge instructions      Comments:   * KEEP GROIN SITE CLEAN AND DRY. Call the office for any signs of bleedings, pus, swelling, increased pain, or any other concerns. * NO HEAVY LIFTING (>10lbs) OR SEXUAL ACTIVITY X 7 DAYS. * NO DRIVING X 2-3 DAYS. * NO SOAKING BATHS, HOT TUBS, POOLS, ETC., X 7 DAYS.     Follow-up Information    Follow up with Hillis Range, MD in 4 weeks. (Our office will call you with your appointment time)    Contact information:   [facility] [address] [place] [address] [phone]           Outstanding Labs/Studies:  None  Duration of Discharge Encounter: Greater than 30 minutes including physician and PA time.  Signed, HOPE, JESSICA PA-C 09/17/2011, 6:46 PM  I have seen, examined the patient, and reviewed the above assessment and plan.  Changes to above are made where necessary.    Co Sign: Hillis Range, MD

## 2011-09-17 NOTE — H&P (Signed)
Rodney Burns is a 66 y.o. male with a h/o atypical atrial flutter who presents today to discuss repeat ablation. He has been followed closely by Dr Graciela Husbands. He was initially identified as having atrial flutter September 2011 for which he was cardioverted. He was then found earlier this year to be in atrial flutter again with a different flutter morphology. This one also was somewhat atypical but close to typical particularly with negative flutter waves in the inferior leads. He was started on Pradaxa and underwent EP study by Dr Graciela Husbands 03/2011. His tachycardia was felt to be atrial tachycardia arising from near the tricuspid valve. This was ablated and rendered non inducible. Unfortunately, he returned 4/13 with a slightly different morphology atrial flutter. He was cardioverted and placed on amiodarone. He has been in sinus rhythm since that time.  Today, he denies symptoms of palpitations, chest pain, shortness of breath, orthopnea, PND, lower extremity edema, dizziness, presyncope, syncope, or neurologic sequela. The patient is tolerating medications without difficulties and is otherwise without complaint today.   Past Medical History   Diagnosis  Date   .  Atrial flutter      Status post successful DC cardioversion in September of 2011; s/p RFCA 03/2011   .  MI (myocardial infarction)      Remote anterior   .  Coronary artery disease      Status post CABG in 1994   .  CLL (chronic lymphoblastic leukemia)      Stage 0-1   .  Hypercholesterolemia      Well Controlled   .  Hypertension    .  Cough      Consistant with ACE inhibitor mediated cough   .  Malaria  1972     Hx of   .  Sleep-disordered breathing  06/20/2011    Past Surgical History   Procedure  Date   .  Coronary artery bypass graft  1994     By Dr. Laneta Simmers. This included an LIMA graft to the left circumflex coronary, a sequential saphenous vein graft to the diagonal and the LAD, and a vein graft to the posterior descending and  posterior lateral branches of the coronary artery.   .  US echocardiography  09-07-09     Est EF 40-45%   .  Cardioversion  06/03/2011     Procedure: CARDIOVERSION; Surgeon: Duke Salvia, MD; Location: [facility] OR; Service: Cardiovascular; Laterality: N/A;   .  Ep study and ablation  2013     by Dr Graciela Husbands    Current Outpatient Prescriptions   Medication  Sig  Dispense  Refill   .  dabigatran (PRADAXA) 150 MG CAPS  Take 150 mg by mouth 2 (two) times daily.     [name]  docusate sodium (COLACE) 100 MG capsule  Take 100 mg by mouth 2 (two) times daily.     [name]  ezetimibe-simvastatin (VYTORIN) 10-20 MG per tablet  Take 1 tablet by mouth at bedtime.  90 tablet  3   .  Fiber CHEW  Chew 2 tablets by mouth every morning.     .  latanoprost (XALATAN) 0.005 % ophthalmic solution  Place 1 drop into both eyes at bedtime.     [name]  loratadine (CLARITIN) 10 MG tablet  Take 10 mg by mouth daily.     [name]  losartan (COZAAR) 50 MG tablet  Take 1 tablet (50 mg total) by mouth daily.  30 tablet  6   .  metoprolol  succinate (TOPROL-XL) 25 MG 24 hr tablet  Take 1 tablet (25 mg total) by mouth daily.  90 tablet  3   .  niacin (NIASPAN) 750 MG CR tablet  Take 1,500 mg by mouth at bedtime.     .  RABEprazole (ACIPHEX) 20 MG tablet  Take 20 mg by mouth daily.     .  traMADol (ULTRAM) 50 MG tablet  Take 50 mg by mouth every 6 (six) hours as needed. For pain      Allergies   Allergen  Reactions   .  Ace Inhibitors      Cause a Cough   .  Darvocet (Propoxyphene-Acetaminophen)      Tolerates tylenol   .  Penicillins     History    Social History   .  Marital Status:  Married     Spouse Name:  N/A     Number of Children:  2   .  Years of Education:  N/A    Occupational History   .  Transport planner     Social History Main Topics   .  Smoking status:  Former Smoker     Quit date:  01/22/1992   .  Smokeless tobacco:  Not on file   .  Alcohol Use:  No   .  Drug Use:    .  Sexually Active:  Yes    Other Topics  Concern   .   Not on file    Social History Narrative   .  No narrative on file    No family history on file.  ROS- All systems are reviewed and negative except as per the HPI above  Physical Exam:  There were no vitals filed for this visit.  GEN- The patient is well appearing, alert and oriented x 3 today.  Head- normocephalic, atraumatic  Eyes- Sclera clear, conjunctiva pink  Ears- hearing intact  Oropharynx- clear  Neck- supple, no JVP  Lymph- no cervical lymphadenopathy  Lungs- Clear to ausculation bilaterally, normal work of breathing  Heart- Regular rate and rhythm, no murmurs, rubs or gallops, PMI not laterally displaced  GI- soft, NT, ND, + BS  Extremities- no clubbing, cyanosis, or edema  MS- no significant deformity or atrophy  Skin- no rash or lesion  Psych- euthymic mood, full affect  Neuro- strength and sensation are intact  EKG today reveals sinus bradycardia    Assessment and Plan:   Atrial flutter-atypical - Hillis Range, MD    The patient has recurrent atrial flutter. Prior EPS 3/13 by Dr Graciela Husbands is reviewed and revealed right atrial tachycardia at that time. He underwent ablation and was non inducible and the end of the study. He developed recurrent atrial flutter 4/13 but with a slightly different morphology. He was cardioverted and placed on amiodarone. Though he has done quite well on amiodarone, I agree with Dr Graciela Husbands that we should try to again ablate his atrial flutter to hopefully be able to discontinue amiodarone and anticoagulation long term which both pose risks of their own.  Therapeutic strategies for atrial arrhythmias including medicine and ablation were discussed in detail with the patient today. Risk, benefits, and alternatives to EP study and radiofrequency ablation were also discussed in detail today. These risks include but are not limited to stroke, bleeding, vascular damage, tamponade, perforation, damage to the heart and other structures, AV block requiring PPM,  worsening renal function, and death. The patient understands these risk and wishes to proceed.

## 2011-09-17 NOTE — Preoperative (Signed)
Beta Blockers   Reason not to administer Beta Blockers:Lotensin 0430 today

## 2011-09-17 NOTE — Transfer of Care (Signed)
Immediate Anesthesia Transfer of Care Note  Patient: Rodney Burns  Procedure(s) Performed: Procedure(s) (LRB): ATRIAL FLUTTER ABLATION (N/A)  Patient Location: PACU  Anesthesia Type: General  Level of Consciousness: awake, alert  and patient cooperative  Airway & Oxygen Therapy: Patient Spontanous Breathing and Patient connected to nasal cannula oxygen  Post-op Assessment: Report given to PACU RN, Post -op Vital signs reviewed and stable and Patient moving all extremities  Post vital signs: Reviewed and stable  Complications: No apparent anesthesia complications

## 2011-09-17 NOTE — Anesthesia Preprocedure Evaluation (Addendum)
Anesthesia Evaluation  Patient identified by MRN, date of birth, ID band Patient awake    Reviewed: Allergy & Precautions, H&P , NPO status , Patient's Chart, lab work & pertinent test results, reviewed documented beta blocker date and time   History of Anesthesia Complications Negative for: history of anesthetic complications  Airway Mallampati: II TM Distance: >3 FB Neck ROM: Full    Dental No notable dental hx. (+) Teeth Intact and Dental Advisory Given   Pulmonary neg pulmonary ROS,  breath sounds clear to auscultation  Pulmonary exam normal       Cardiovascular hypertension, Pt. on medications and Pt. on home beta blockers + CAD and + Past MI + dysrhythmias Atrial Fibrillation Rhythm:Regular Rate:Normal  ECHO '13: EF 40-45, valves OK   Neuro/Psych negative neurological ROS  negative psych ROS   GI/Hepatic Neg liver ROS, GERD-  Medicated and Controlled,  Endo/Other  negative endocrine ROS  Renal/GU negative Renal ROS     Musculoskeletal   Abdominal (+) + obese,   Peds  Hematology CLL   Anesthesia Other Findings   Reproductive/Obstetrics                          Anesthesia Physical Anesthesia Plan  ASA: III  Anesthesia Plan: General   Post-op Pain Management:    Induction: Intravenous  Airway Management Planned: LMA  Additional Equipment:   Intra-op Plan:   Post-operative Plan:   Informed Consent: I have reviewed the patients History and Physical, chart, labs and discussed the procedure including the risks, benefits and alternatives for the proposed anesthesia with the patient or authorized representative who has indicated his/her understanding and acceptance.   Dental advisory given  Plan Discussed with: Surgeon and CRNA  Anesthesia Plan Comments: (Plan routine monitors, GA- LMA OK)        Anesthesia Quick Evaluation

## 2011-09-17 NOTE — Brief Op Note (Signed)
09/17/2011  10:15 AM  PATIENT:  Rodney Burns  66 y.o. male  PRE-OPERATIVE DIAGNOSIS:  Atrial tachycardia  POST-OPERATIVE DIAGNOSIS:  1. Atrial tachycardia, 2. Atrial fibrillation  PROCEDURE:  Procedure(s) (LRB): EP study and ablation of atrial tachycardia, cardioversion  SURGEON:  Surgeon(s) and Role:    * Hillis Range, MD - Primary  PHYSICIAN ASSISTANT:   ASSISTANTS: none   ANESTHESIA:   general and IV sedation  EBL:  Total I/O In: 1250 [I.V.:1000; IV Piggyback:250] Out: -   BLOOD ADMINISTERED:none  DRAINS: none   LOCAL MEDICATIONS USED:  NONE  SPECIMEN:  No Specimen  DISPOSITION OF SPECIMEN:  N/A  COUNTS:  YES  TOURNIQUET:  * No tourniquets in log *  DICTATION: .Other Dictation: Dictation Number [id number]  PLAN OF CARE: Admit for overnight observation  PATIENT DISPOSITION:  PACU - hemodynamically stable.   Delay start of Pharmacological VTE agent (>24hrs) due to surgical blood loss or risk of bleeding: not applicable

## 2011-09-17 NOTE — Anesthesia Postprocedure Evaluation (Signed)
  Anesthesia Post-op Note  Patient: Rodney Burns  Procedure(s) Performed: Procedure(s) (LRB): ATRIAL FLUTTER ABLATION (N/A)  Patient Location: PACU and Cath Lab  Anesthesia Type: General  Level of Consciousness: awake, alert  and oriented  Airway and Oxygen Therapy: Patient Spontanous Breathing  Post-op Pain: none  Post-op Assessment: Post-op Vital signs reviewed, Patient's Cardiovascular Status Stable, Respiratory Function Stable, Patent Airway, No signs of Nausea or vomiting and Pain level controlled  Post-op Vital Signs: Reviewed and stable  Complications: No apparent anesthesia complications

## 2011-09-18 NOTE — Op Note (Signed)
Rodney Burns, Rodney Burns NO.:  [id number]  MEDICAL RECORD NO.:  [id number]  LOCATION:  3W20C                        FACILITY:  [facility]  PHYSICIAN:  Hillis Range, MD       DATE OF BIRTH:  02-18-1945  DATE OF PROCEDURE: DATE OF DISCHARGE:  09/17/2011                              OPERATIVE REPORT   SURGEON:  Hillis Range, MD  PREPROCEDURE DIAGNOSIS:  Supraventricular tachycardia.  POSTPROCEDURE DIAGNOSES: 1. Atrial tachycardia. 2. Atrial fibrillation.  PROCEDURES: 1. Comprehensive electrophysiologic study. 2. Coronary sinus pacing and recording. 3. 3D mapping of supraventricular tachycardia. 4. Ablation of atrial tachycardia. 5. Isoproterenol infusion with arrhythmia induction and pacing. 6. External cardioversion.  INTRODUCTION:  Rodney Burns is a pleasant 66 year old gentleman with recurrent atrial arrhythmias.  He has had several atrial arrhythmias documented including atypical atrial flutter and atrial tachycardia.  He presented and was evaluated by Rodney Burns.  He underwent EP study, which revealed an ectopic atrial tachycardia arising from the tricuspid valve anulus.  This tachycardia was successfully ablated. Unfortunately, subsequently had recurrent atrial tachycardia, for which, he was placed on amiodarone.  He has done reasonably well with amiodarone therapy.  He wished to avoid amiodarone long-term.  His amiodarone was therefore discontinued and allowed to wash out for more than 4 weeks.  He therefore presents today for EP study and radiofrequency ablation.  DESCRIPTION OF PROCEDURE:  Informed written consent was obtained and the patient was brought to the Electrophysiology Lab in the fasting state. He was adequately sedated with general anesthesia as outlined in the anesthesia report.  The patient's right groin was prepped and draped in the usual sterile fashion by the EP Lab staff.  Using a percutaneous Seldinger technique, two 7-French and  one 8-French hemostasis sheaths were placed into the right common femoral vein.  A 7-French Biosense Webster decapolar coronary sinus catheter was introduced through the right common femoral vein and advanced into the coronary sinus for recording and pacing from this location.  A 6-French quadripolar Josephson catheter was introduced through the right common femoral vein and advanced into the right ventricle for recording and pacing.  This catheter was then pulled back to the His bundle location.  The patient presented to the Electrophysiology Lab in normal sinus rhythm.  His PR interval measured 195 milliseconds with a QRS duration of 107 milliseconds and a QT interval of 466 milliseconds.  His AH interval measured 112 milliseconds with an HV interval of 56 milliseconds. Ventricular pacing was performed, which revealed midline decremental VA conduction with a VA Wenckebach cycle length of 640 milliseconds with no arrhythmias observed.  Ventricular extrastimulus testing was performed, which revealed a retrograde AV nodal ERP of greater than 700/600 milliseconds.  Rapid atrial pacing was performed, which revealed no evidence of PR greater than RR.  The AV Wenckebach cycle length was 580 milliseconds.  Atrial extrastimulus testing was performed, which revealed decremental AV conduction with no AH jumps, echo beats, or tachycardias.  The AV Wenckebach cycle length was 580 milliseconds. Rapid atrial pacing was continued down to a cycle length of 250 milliseconds with no arrhythmias observed.  Isoproterenol was therefore infused at 2 mcg/minute.  Rapid atrial pacing was performed at a cycle length of 350 milliseconds with inducible atrial tachycardia.  The coronary sinus activation sequence was proximal to distal and suggestive of right atrial tachycardia.  The atrial tachycardia cycle length was 430 milliseconds with 2:1 AV conduction.  Entrainment was performed from the left atrium, which  revealed a long postpacing interval when compared to the tachycardia cycle length.  Entrainment was performed from the proximal coronary sinus along the septal right atrium, which revealed a postpacing interval just slightly greater than the tachycardia cycle length.  A 12-lead EKG was performed and revealed biphasic P waves in V1 and positive P waves in lead 2 with slightly negative P waves in lead 3. I therefore elected to perform three-dimensional atrial mapping within the right atrium.  A 3.5-mm Biosense Webster EZ Steer ThermoCool ablation catheter was introduced through the right common femoral vein and advanced into the right atrium.  Three-dimensional electroanatomical mapping of the tachycardia focus was performed.  This revealed that the earliest activation was a focal activation arising from the posterolateral right atrium at approximately 9 o'clock laterally.  The atrial activation preceded the surface QRS by 85 milliseconds in this location and fractionation was noted from the distal electrode.  Due to recurrence of his atrial arrhythmias, I therefore elected to perform irrigated ablation today.  A single radiofrequency application was delivered in this location.  The tachycardia nearly immediately terminated with RF.  The patient then developed a second tachycardia, which also appeared to be a right atrial tachycardia with a cycle length of 320 milliseconds.  A second radiofrequency application was delivered as a Bonus burn at the site of initial tachycardia.  Following this Bonus burn, I elected to perform 3D mapping of the second atrial tachycardia focus.  Unfortunately during atrial mapping, the tachycardia was spontaneously terminated.  Rapid atrial pacing was therefore performed again as a cycle length of 400 milliseconds, which induced atrial fibrillation.  The atrial fibrillation organized back into the second tachycardia with a cycle length of 320 milliseconds.   Additional three-dimensional atrial mapping was performed; however, again the patient degenerated back into atrial fibrillation.  Eventually, atrial fibrillation organized into the tachycardia and I resumed atrial mapping.  This revealed that the tachycardia appeared to arise along the lateral wall of the right atrium, however, in a different site from the initial tachycardia.  Additional mapping was performed.  Isoproterenol was decreased to 0.25 mcg/minute due to concerns of recurrent atrial fibrillation.  Unfortunately, the patient developed sustained atrial fibrillation despite decreasing Isuprel and required cardioversion.  A single synchronized 200-joule shock was successful in terminating atrial fibrillation.  Unfortunately, however, with atrial pacing, the patient returned to atrial fibrillation.  Isoproterenol was therefore discontinued.  A 360-joule shock was then delivered, which did not affect atrial fibrillation.  Therefore, a second 360-joule shock was delivered, which terminated atrial fibrillation; however, after several sinus beats, the patient returned atrial fibrillation.  Three- dimensional mapping could not be performed due to atrial fibrillation. I therefore waited for isoproterenol to wash out for 15 minutes. Following a 15 minutes wash out, the patient was again cardioverted with a single synchronized 360-joule biphasic shock delivered.  Unfortunately again after several sinus beats, atrial fibrillation returned.  It was therefore felt that no further mapping could be performed today due to incessants of atrial fibrillation.  The procedure was therefore considered completed.  It is noted that heart rates were well controlled during atrial fibrillation in the 50s and 60s.  The HV interval during atrial fibrillation measured 52 milliseconds.  The procedure was therefore considered completed.  All catheters were removed and the sheaths were aspirated and flushed.  The  sheaths were removed and hemostasis was assured.  There were no early apparent complications.  CONCLUSIONS: 1. Sinus rhythm upon presentation. 2. Ectopic atrial tachycardia arising from the posterolateral right     atrium along the 9 o'clock position, successfully ablated in this     location. 3. Induction of a second atrial tachycardia was then performed.     Unfortunately, mapping and ablation of this     tachycardia could not be performed due to atrial fibrillation. 4. Unsuccessful cardioversion of atrial fibrillation. 5. No early apparent complications.     Hillis Range, MD     JA/MEDQ  D:  09/17/2011  T:  09/18/2011  Job:  [id number]  cc:   Duke Salvia, MD, [facility] [name]. [name], M.D.

## 2011-09-20 ENCOUNTER — Other Ambulatory Visit: Payer: Self-pay

## 2011-09-20 MED ORDER — NIACIN ER (ANTIHYPERLIPIDEMIC) 750 MG PO TBCR: 1500.0000 mg | EXTENDED_RELEASE_TABLET | Freq: Every day | ORAL | Status: DC

## 2011-10-18 ENCOUNTER — Ambulatory Visit (INDEPENDENT_AMBULATORY_CARE_PROVIDER_SITE_OTHER): Payer: Medicare Other | Admitting: Internal Medicine

## 2011-10-18 ENCOUNTER — Encounter: Payer: Self-pay | Admitting: Internal Medicine

## 2011-10-18 VITALS — BP 125/50 | HR 63 | Ht 70.0 in | Wt 218.0 lb

## 2011-10-18 DIAGNOSIS — I4892 Unspecified atrial flutter: Secondary | ICD-10-CM | POA: Diagnosis not present

## 2011-10-18 DIAGNOSIS — I4891 Unspecified atrial fibrillation: Secondary | ICD-10-CM | POA: Diagnosis not present

## 2011-10-18 NOTE — Patient Instructions (Addendum)
Your physician recommends that you schedule a follow-up appointment in: 3 months with Dr. Jordan  

## 2011-10-21 ENCOUNTER — Encounter: Payer: Self-pay | Admitting: Internal Medicine

## 2011-10-21 NOTE — Assessment & Plan Note (Signed)
Doing well s/p ablation without symptomatic recurrence Though he had afib during his ablation, he has not had this observed clinically.  It may be reasonable to stop anticoagulation and follow for afib going forward.  I will discuss this with Dr [name] when he returns from vacation. I will return the patient's care back to Dr [name] at this time and will see him as needed going forward.

## 2011-10-21 NOTE — Progress Notes (Signed)
PCP: [name], MD Primary Cardiologist:  Dr [name]  Rodney Burns is a 67 y.o. male who presents today for routine electrophysiology followup.  Since his atrial flutter ablation, the patient reports doing very well.  He denies any recurrent symptoms of arrhythmia.  Today, he denies symptoms of palpitations, chest pain, shortness of breath,  lower extremity edema, dizziness, presyncope, or syncope.  The patient is otherwise without complaint today.   Past Medical History  Diagnosis Date  . Atrial tachycardia     s/p DCCV 09/2009, s/p RFCA 03/2011, s/p DCCV 04/2011, s/p RFCA 09/17/11  . Coronary artery disease     Status post CABG in 1994  . CLL (chronic lymphoblastic leukemia)     Stage 0-1  . Hypercholesterolemia     Well Controlled  . Hypertension   . Cough     Consistant with ACE inhibitor mediated cough  . Malaria 1972    Hx of  . Sleep-disordered breathing 06/20/2011  . History of tobacco abuse     quit 1994   Past Surgical History  Procedure Date  . Coronary artery bypass graft 1994    By Dr. Laneta Simmers. This included an LIMA graft to the left circumflex coronary, a sequential saphenous vein graft to the diagonal and the LAD, and a vein graft to the posterior descending and posterior lateral branches of the coronary artery.  . US echocardiography 09-07-09    Est EF 40-45%  . Cardioversion 06/03/2011    Procedure: CARDIOVERSION;  Surgeon: Duke Salvia, MD;  Location: [facility] OR;  Service: Cardiovascular;  Laterality: N/A;  . Ep study and ablation 2013    by Dr Graciela Husbands, repeat ablation by Dr Johney Frame    Current Outpatient Prescriptions  Medication Sig Dispense Refill  . dabigatran (PRADAXA) 150 MG CAPS Take 150 mg by mouth 2 (two) times daily.      [name] docusate sodium (COLACE) 100 MG capsule Take 100 mg by mouth 2 (two) times daily.      [name] ezetimibe-simvastatin (VYTORIN) 10-20 MG per tablet Take 1 tablet by mouth at bedtime.  90 tablet  3  . Fiber CHEW Chew 2 tablets by mouth every  morning.      . latanoprost (XALATAN) 0.005 % ophthalmic solution Place 1 drop into both eyes at bedtime.       . lidocaine (LIDODERM) 5 % Place 1 patch onto the skin daily as needed. Remove & Discard patch within 12 hours or as directed by MD, for left shoulder pain      . losartan (COZAAR) 50 MG tablet Take 1 tablet (50 mg total) by mouth daily.  30 tablet  6  . metoprolol succinate (TOPROL-XL) 25 MG 24 hr tablet Take 1 tablet (25 mg total) by mouth daily.  90 tablet  3  . niacin (NIASPAN) 750 MG CR tablet Take 2 tablets (1,500 mg total) by mouth at bedtime.  180 tablet  3  . pramipexole (MIRAPEX) 0.125 MG tablet Take 0.125 mg by mouth daily as needed. For legs      . RABEprazole (ACIPHEX) 20 MG tablet Take 20 mg by mouth daily.        . traMADol (ULTRAM) 50 MG tablet Take 50 mg by mouth 3 (three) times daily as needed. For pain        Physical Exam: Filed Vitals:   10/18/11 1618  BP: 125/50  Pulse: 63  Height: 5\' 10"  (1.778 m)  Weight: 218 lb (98.884 kg)  SpO2: 96%  GEN- The patient is well appearing, alert and oriented x 3 today.   Head- normocephalic, atraumatic Eyes-  Sclera clear, conjunctiva pink Ears- hearing intact Oropharynx- clear Lungs- Clear to ausculation bilaterally, normal work of breathing Heart- Regular rate and rhythm, no murmurs, rubs or gallops, PMI not laterally displaced GI- soft, NT, ND, + BS Extremities- no clubbing, cyanosis, or edema  ekg today reveals sinus rhythm 63 bpm, PR 168, QRS 102, Qtc 444   Assessment and Plan:

## 2011-10-22 ENCOUNTER — Telehealth: Payer: Self-pay

## 2011-10-22 NOTE — Telephone Encounter (Signed)
Patient called was told received a message from Dr.Jordan ok to stop Pradaxa.

## 2011-11-18 ENCOUNTER — Other Ambulatory Visit: Payer: Self-pay | Admitting: Cardiology

## 2011-11-18 DIAGNOSIS — Z23 Encounter for immunization: Secondary | ICD-10-CM | POA: Diagnosis not present

## 2011-12-12 ENCOUNTER — Other Ambulatory Visit: Payer: Self-pay | Admitting: Oncology

## 2011-12-12 ENCOUNTER — Telehealth: Payer: Self-pay | Admitting: Oncology

## 2011-12-12 NOTE — Telephone Encounter (Signed)
S/w the pt and he is aware of his dec appts. °

## 2011-12-13 ENCOUNTER — Ambulatory Visit: Payer: Medicare Other | Admitting: Oncology

## 2011-12-13 ENCOUNTER — Other Ambulatory Visit: Payer: Medicare Other | Admitting: Lab

## 2012-01-01 ENCOUNTER — Other Ambulatory Visit: Payer: Self-pay | Admitting: Oncology

## 2012-01-02 ENCOUNTER — Other Ambulatory Visit (HOSPITAL_BASED_OUTPATIENT_CLINIC_OR_DEPARTMENT_OTHER): Payer: Medicare Other | Admitting: Lab

## 2012-01-02 ENCOUNTER — Ambulatory Visit (HOSPITAL_BASED_OUTPATIENT_CLINIC_OR_DEPARTMENT_OTHER): Payer: Medicare Other | Admitting: Oncology

## 2012-01-02 VITALS — BP 115/66 | HR 56 | Temp 97.9°F | Resp 20 | Ht 70.0 in | Wt 222.3 lb

## 2012-01-02 DIAGNOSIS — D696 Thrombocytopenia, unspecified: Secondary | ICD-10-CM

## 2012-01-02 DIAGNOSIS — C9110 Chronic lymphocytic leukemia of B-cell type not having achieved remission: Secondary | ICD-10-CM

## 2012-01-02 DIAGNOSIS — I4891 Unspecified atrial fibrillation: Secondary | ICD-10-CM

## 2012-01-02 LAB — CBC WITH DIFFERENTIAL/PLATELET
BASO%: 0.3 % (ref 0.0–2.0)
EOS%: 0.6 % (ref 0.0–7.0)
LYMPH%: 79.1 % — ABNORMAL HIGH (ref 14.0–49.0)
MCHC: 31.4 g/dL — ABNORMAL LOW (ref 32.0–36.0)
MCV: 85.9 fL (ref 79.3–98.0)
MONO%: 3.2 % (ref 0.0–14.0)
Platelets: 100 10*3/uL — ABNORMAL LOW (ref 140–400)
RBC: 5.1 10*6/uL (ref 4.20–5.82)
RDW: 14.8 % — ABNORMAL HIGH (ref 11.0–14.6)

## 2012-01-02 LAB — COMPREHENSIVE METABOLIC PANEL (CC13)
AST: 26 U/L (ref 5–34)
Albumin: 4.2 g/dL (ref 3.5–5.0)
BUN: 19 mg/dL (ref 7.0–26.0)
CO2: 28 mEq/L (ref 22–29)
Calcium: 9.2 mg/dL (ref 8.4–10.4)
Chloride: 105 mEq/L (ref 98–107)
Glucose: 97 mg/dl (ref 70–99)
Potassium: 4.1 mEq/L (ref 3.5–5.1)

## 2012-01-02 LAB — TECHNOLOGIST REVIEW

## 2012-01-02 MED ORDER — TRAMADOL HCL 50 MG PO TABS: 50.0000 mg | ORAL_TABLET | Freq: Four times a day (QID) | ORAL | Status: DC | PRN

## 2012-01-02 NOTE — Patient Instructions (Addendum)
Continue follow up every 6 months  Ultram 50 mg three times a day for pain

## 2012-01-02 NOTE — Progress Notes (Signed)
OFFICE PROGRESS NOTE  CC  [name], MD [address] [place] [address]  DIAGNOSIS: is 66 year old gentleman with stage 0 CLL  PRIOR THERAPY:observation  CURRENT THERAPY:observation  INTERVAL HISTORY: Rodney Burns 66 y.o. male returns for followup visit. Overall he feels well.Otherwise he is feeling well he has not had any fevers chills night sweats any infections no recent pneumonias or bronchitis. He denies having any bleeding problems no hematuria hematochezia melena hemoptysis or hematemesis. Remainder of the 10 point review of systems is negative.[name] MEDICAL HISTORY: Past Medical History  Diagnosis Date  . Atrial tachycardia     s/p DCCV 09/2009, s/p RFCA 03/2011, s/p DCCV 04/2011, s/p RFCA 09/17/11  . Coronary artery disease     Status post CABG in 1994  . CLL (chronic lymphoblastic leukemia)     Stage 0-1  . Hypercholesterolemia     Well Controlled  . Hypertension   . Cough     Consistant with ACE inhibitor mediated cough  . Malaria 1972    Hx of  . Sleep-disordered breathing 06/20/2011  . History of tobacco abuse     quit 1994    ALLERGIES:  is allergic to penicillins; ace inhibitors; darvocet; and sulfa antibiotics.  MEDICATIONS:  Current Outpatient Prescriptions  Medication Sig Dispense Refill  . docusate sodium (COLACE) 100 MG capsule Take 100 mg by mouth 2 (two) times daily.      [name] ezetimibe-simvastatin (VYTORIN) 10-20 MG per tablet Take 1 tablet by mouth at bedtime.  90 tablet  3  . Fiber CHEW Chew 2 tablets by mouth every morning.      . latanoprost (XALATAN) 0.005 % ophthalmic solution Place 1 drop into both eyes at bedtime.       . lidocaine (LIDODERM) 5 % Place 1 patch onto the skin daily as needed. Remove & Discard patch within 12 hours or as directed by MD, for left shoulder pain      . losartan (COZAAR) 50 MG tablet TAKE 1 TABLET (50 MG TOTAL) BY MOUTH DAILY.  30 tablet  6  . metoprolol succinate (TOPROL-XL) 25 MG 24 hr tablet Take 1  tablet (25 mg total) by mouth daily.  90 tablet  3  . niacin (NIASPAN) 750 MG CR tablet Take 2 tablets (1,500 mg total) by mouth at bedtime.  180 tablet  3  . pramipexole (MIRAPEX) 0.125 MG tablet Take 0.125 mg by mouth daily as needed. For legs      . RABEprazole (ACIPHEX) 20 MG tablet Take 20 mg by mouth daily.        . traMADol (ULTRAM) 50 MG tablet Take 50 mg by mouth 3 (three) times daily as needed. For pain      . traMADol (ULTRAM) 50 MG tablet TAKE 1 TABLET EVERY DAY  30 tablet  4  . traMADol (ULTRAM) 50 MG tablet Take 1 tablet (50 mg total) by mouth every 6 (six) hours as needed for pain.  90 tablet  6    SURGICAL HISTORY:  Past Surgical History  Procedure Date  . Coronary artery bypass graft 1994    By Dr. Laneta Simmers. This included an LIMA graft to the left circumflex coronary, a sequential saphenous vein graft to the diagonal and the LAD, and a vein graft to the posterior descending and posterior lateral branches of the coronary artery.  . US echocardiography 09-07-09    Est EF 40-45%  . Cardioversion 06/03/2011    Procedure: CARDIOVERSION;  Surgeon: Duke Salvia,  MD;  Location: [facility] OR;  Service: Cardiovascular;  Laterality: N/A;  . Ep study and ablation 2013    by Dr Graciela Husbands, repeat ablation by Dr Johney Frame    REVIEW OF SYSTEMS:  Pertinent items are noted in HPI.   PHYSICAL EXAMINATION: Well-developed well-nourished male in no acute distress HEENT exam EOMI PERRLA sclerae anicteric no conjunctival pallor oral mucosa is moist neck is supple lungs clear cardiovascular regular rate rhythm no murmurs abdomen is soft nontender no HSM extremities no edema neuro patient's alert oriented otherwise nonfocal, lymph node survey negative for cervical supraclavicular or axillary adenopathy  ECOG PERFORMANCE STATUS: 0 - Asymptomatic  Blood pressure 115/66, pulse 56, temperature 97.9 F (36.6 C), temperature source Oral, resp. rate 20, height 5\' 10"  (1.778 m), weight 222 lb 4.8 oz (100.835  kg).  LABORATORY DATA: Lab Results  Component Value Date   WBC 20.8* 01/02/2012   HGB 13.7 01/02/2012   HCT 43.8 01/02/2012   MCV 85.9 01/02/2012   PLT 100* 01/02/2012      Chemistry      Component Value Date/Time   NA 140 09/12/2011 0824   K 4.6 09/12/2011 0824   CL 106 09/12/2011 0824   CO2 28 09/12/2011 0824   BUN 11 09/12/2011 0824   CREATININE 1.05 09/12/2011 0824      Component Value Date/Time   CALCIUM 9.0 09/12/2011 0824   ALKPHOS 41 09/12/2011 0824   AST 23 09/12/2011 0824   ALT 30 09/12/2011 0824   BILITOT 0.9 09/12/2011 0824       RADIOGRAPHIC STUDIES:  No results found.  ASSESSMENT: 66 year old gentleman with  #1 CLL stage 0 essentially remaining stable.  #2 atrial fibrillation.  #3 thrombocytopenia secondary to #1We will continue to monitor this.  PLAN:   #1 patient will continue to be observed for his CLL no acute intervention is needed.  #2 I will plan on seeing the patient back in 6 months   All questions were answered. The patient knows to call the clinic with any problems, questions or concerns. We can certainly see the patient much sooner if necessary.  I spent 25 minutes counseling the patient face to face. The total time spent in the appointment was 30 minutes.    Drue Second, MD Medical/Oncology [facility] [phone] (beeper) [phone] (Office)  01/02/2012, 11:39 AM

## 2012-01-03 LAB — DIRECT ANTIGLOBULIN TEST (NOT AT ARMC): DAT (Complement): NEGATIVE

## 2012-01-03 LAB — IGG, IGA, IGM: IgG (Immunoglobin G), Serum: 724 mg/dL (ref 650–1600)

## 2012-01-10 ENCOUNTER — Ambulatory Visit: Payer: Medicare Other | Admitting: Cardiology

## 2012-01-28 ENCOUNTER — Ambulatory Visit (INDEPENDENT_AMBULATORY_CARE_PROVIDER_SITE_OTHER): Payer: Medicare Other | Admitting: Cardiology

## 2012-01-28 ENCOUNTER — Encounter: Payer: Self-pay | Admitting: Cardiology

## 2012-01-28 VITALS — BP 124/82 | HR 60 | Ht 70.0 in | Wt 224.8 lb

## 2012-01-28 DIAGNOSIS — I4892 Unspecified atrial flutter: Secondary | ICD-10-CM

## 2012-01-28 DIAGNOSIS — I10 Essential (primary) hypertension: Secondary | ICD-10-CM | POA: Diagnosis not present

## 2012-01-28 DIAGNOSIS — I255 Ischemic cardiomyopathy: Secondary | ICD-10-CM | POA: Insufficient documentation

## 2012-01-28 DIAGNOSIS — E7800 Pure hypercholesterolemia, unspecified: Secondary | ICD-10-CM | POA: Diagnosis not present

## 2012-01-28 DIAGNOSIS — I2510 Atherosclerotic heart disease of native coronary artery without angina pectoris: Secondary | ICD-10-CM

## 2012-01-28 DIAGNOSIS — I509 Heart failure, unspecified: Secondary | ICD-10-CM

## 2012-01-28 DIAGNOSIS — I5022 Chronic systolic (congestive) heart failure: Secondary | ICD-10-CM

## 2012-01-28 MED ORDER — LOSARTAN POTASSIUM 50 MG PO TABS: 50.0000 mg | ORAL_TABLET | Freq: Every day | ORAL | Status: DC

## 2012-01-28 MED ORDER — NIACIN ER (ANTIHYPERLIPIDEMIC) 750 MG PO TBCR: 1500.0000 mg | EXTENDED_RELEASE_TABLET | Freq: Every day | ORAL | Status: DC

## 2012-01-28 MED ORDER — RABEPRAZOLE SODIUM 20 MG PO TBEC: 20.0000 mg | DELAYED_RELEASE_TABLET | Freq: Every day | ORAL | Status: AC

## 2012-01-28 MED ORDER — METOPROLOL SUCCINATE ER 25 MG PO TB24: 25.0000 mg | ORAL_TABLET | Freq: Every day | ORAL | Status: DC

## 2012-01-28 NOTE — Progress Notes (Signed)
Rodney Burns Date of Birth: [birth year]   History of Present Illness: Rodney Burns is seen for followup today. He has a history of atrial flutter and has had multiple cardioversions. He had radiofrequency ablation in March and then again in August of 2013. He's had no recurrent symptomatic episodes since then. He has a history of coronary disease and is status post CABG in 1994. He had a nuclear stress test in January 2013 which showed an inferior lateral infarct without evidence of ischemia. Ejection fraction was 39%. He currently denies any symptoms of dyspnea, chest pain, or increase in edema.  Current Outpatient Prescriptions on File Prior to Visit  Medication Sig Dispense Refill  . docusate sodium (COLACE) 100 MG capsule Take 100 mg by mouth 2 (two) times daily.      [name] ezetimibe-simvastatin (VYTORIN) 10-20 MG per tablet Take 1 tablet by mouth at bedtime.  90 tablet  3  . Fiber CHEW Chew 2 tablets by mouth every morning.      . latanoprost (XALATAN) 0.005 % ophthalmic solution Place 1 drop into both eyes at bedtime.       . lidocaine (LIDODERM) 5 % Place 1 patch onto the skin daily as needed. Remove & Discard patch within 12 hours or as directed by MD, for left shoulder pain      . losartan (COZAAR) 50 MG tablet Take 1 tablet (50 mg total) by mouth daily.  90 tablet  3  . metoprolol succinate (TOPROL-XL) 25 MG 24 hr tablet Take 1 tablet (25 mg total) by mouth daily.  90 tablet  3  . pramipexole (MIRAPEX) 0.125 MG tablet Take 0.125 mg by mouth daily as needed. For legs      . RABEprazole (ACIPHEX) 20 MG tablet Take 1 tablet (20 mg total) by mouth daily.  90 tablet  3  . traMADol (ULTRAM) 50 MG tablet Take 1 tablet (50 mg total) by mouth every 6 (six) hours as needed for pain.  90 tablet  6    Allergies  Allergen Reactions  . Penicillins Other (See Comments)    Put pt in hospital 50 yrs ago  . Ace Inhibitors     Cause a Cough  . Darvocet (Propoxyphene-Acetaminophen) Rash    Tolerates tylenol    . Sulfa Antibiotics Rash    Past Medical History  Diagnosis Date  . Atrial tachycardia     s/p DCCV 09/2009, s/p RFCA 03/2011, s/p DCCV 04/2011, s/p RFCA 09/17/11  . Coronary artery disease     Status post CABG in 1994  . CLL (chronic lymphoblastic leukemia)     Stage 0-1  . Hypercholesterolemia     Well Controlled  . Hypertension   . Cough     Consistant with ACE inhibitor mediated cough  . Malaria 1972    Hx of  . Sleep-disordered breathing 06/20/2011  . History of tobacco abuse     quit 1994    Past Surgical History  Procedure Date  . Coronary artery bypass graft 1994    By Dr. Laneta Simmers. This included an LIMA graft to the left circumflex coronary, a sequential saphenous vein graft to the diagonal and the LAD, and a vein graft to the posterior descending and posterior lateral branches of the coronary artery.  . US echocardiography 09-07-09    Est EF 40-45%  . Cardioversion 06/03/2011    Procedure: CARDIOVERSION;  Surgeon: Duke Salvia, MD;  Location: [facility] OR;  Service: Cardiovascular;  Laterality: N/A;  . Ep  study and ablation 2013    by Dr Graciela Husbands, repeat ablation by Dr Johney Frame    History  Smoking status  . Former Smoker  . Quit date: 01/22/1992  Smokeless tobacco  . Not on file    History  Alcohol Use No    No family history on file.  Review of Systems: As noted in history of present illness.  All other systems were reviewed and are negative.  Physical Exam: BP 124/82  Pulse 60  Ht 5\' 10"  (1.778 m)  Wt 224 lb 12.8 oz (101.969 kg)  BMI 32.26 kg/m2  SpO2 96% He is a pleasant white male in no acute distress. He is normocephalic, atraumatic. Pupils are equal round and reactive to light accommodation. Extraocular movements are full. Oropharynx is clear. Neck is supple without JVD, adenopathy, or megaly, or bruits. Lungs are clear. Cardiac exam reveals an irregular rate and rhythm without gallop, murmur, or click. Abdomen is obese, soft, nontender. He has good pedal  pulses. He has no edema. He is alert and oriented x3. Cranial nerves II through XII are intact. LABORATORY DATA:   Assessment / Plan: 1. Recurrent atrial flutter. Now status post radiofrequency ablation x2. Last was in August of 2013. No evidence of recurrence. We will continue with aspirin at this point. Continue metoprolol.  2. Coronary disease status post CABG in 1994. Last nuclear stress test in January 2013 showed an inferior lateral infarct without ischemia. Patient is asymptomatic. Continue aspirin and beta blocker therapy.  3. Chronic congestive heart failure. Ejection fraction of approximately 40%. No evidence of volume overload. Continue ARB and beta blocker therapy.  4. CLL stage 0.  5. Hypertension, controlled.

## 2012-01-28 NOTE — Patient Instructions (Signed)
Continue your current medication.  I will see you again in 6 months.   

## 2012-03-19 DIAGNOSIS — H4011X0 Primary open-angle glaucoma, stage unspecified: Secondary | ICD-10-CM | POA: Diagnosis not present

## 2012-03-19 DIAGNOSIS — H2510 Age-related nuclear cataract, unspecified eye: Secondary | ICD-10-CM | POA: Diagnosis not present

## 2012-03-25 ENCOUNTER — Other Ambulatory Visit: Payer: Self-pay

## 2012-03-25 ENCOUNTER — Telehealth: Payer: Self-pay | Admitting: Cardiology

## 2012-03-25 NOTE — Telephone Encounter (Signed)
Called [facility] in [place] and spoke to pharmacist who stated Niacin 750 MG was refilled yesterday and has 3 remaining refills left. Called patient and let them know medication is ready for pickup and enough refills for 1 year remaining

## 2012-03-25 NOTE — Telephone Encounter (Signed)
New Problem:     Patient called in needing a refill of his niacin (NIASPAN) 750 MG CR tablet.

## 2012-04-18 ENCOUNTER — Other Ambulatory Visit: Payer: Self-pay | Admitting: Cardiology

## 2012-05-15 ENCOUNTER — Other Ambulatory Visit: Payer: Self-pay

## 2012-05-15 MED ORDER — METOPROLOL SUCCINATE ER 25 MG PO TB24: 25.0000 mg | ORAL_TABLET | Freq: Every day | ORAL | Status: DC

## 2012-05-15 MED ORDER — NIACIN ER (ANTIHYPERLIPIDEMIC) 750 MG PO TBCR: 1500.0000 mg | EXTENDED_RELEASE_TABLET | Freq: Every day | ORAL | Status: DC

## 2012-05-15 MED ORDER — LOSARTAN POTASSIUM 50 MG PO TABS: 50.0000 mg | ORAL_TABLET | Freq: Every day | ORAL | Status: DC

## 2012-05-15 NOTE — Telephone Encounter (Signed)
Patient Instructions    Continue your current medication   I will see you again in 6 months.             Chart Reviewed By    Charna Elizabeth, LPN  on 01/26/1094  3:51 PM        Previous Visit      Provider Department Encounter #    01/10/2012  3:15 PM Peter [name], MD [facility] [id number]

## 2012-05-15 NOTE — Telephone Encounter (Signed)
Patient Instructions    Continue your current medication   I will see you again in 6 months.             Chart Reviewed By    Cheryl Johnson D Pugh, LPN  on 01/28/2012  3:51 PM        Previous Visit      Provider Department Encounter #    01/10/2012  3:15 PM Peter Jordan, MD [facility] [id number]      

## 2012-05-22 ENCOUNTER — Other Ambulatory Visit: Payer: Self-pay | Admitting: *Deleted

## 2012-05-22 MED ORDER — EZETIMIBE-SIMVASTATIN 10-20 MG PO TABS: 1.0000 | ORAL_TABLET | Freq: Every day | ORAL | Status: DC

## 2012-07-02 ENCOUNTER — Telehealth: Payer: Self-pay | Admitting: Oncology

## 2012-07-02 ENCOUNTER — Other Ambulatory Visit: Payer: Medicare Other | Admitting: Lab

## 2012-07-02 ENCOUNTER — Ambulatory Visit: Payer: Medicare Other | Admitting: Oncology

## 2012-07-22 DIAGNOSIS — H4011X0 Primary open-angle glaucoma, stage unspecified: Secondary | ICD-10-CM | POA: Diagnosis not present

## 2012-07-22 DIAGNOSIS — H409 Unspecified glaucoma: Secondary | ICD-10-CM | POA: Diagnosis not present

## 2012-07-30 ENCOUNTER — Telehealth: Payer: Self-pay | Admitting: Oncology

## 2012-07-30 ENCOUNTER — Other Ambulatory Visit (HOSPITAL_BASED_OUTPATIENT_CLINIC_OR_DEPARTMENT_OTHER): Payer: Medicare Other

## 2012-07-30 ENCOUNTER — Ambulatory Visit (HOSPITAL_BASED_OUTPATIENT_CLINIC_OR_DEPARTMENT_OTHER): Payer: Medicare Other | Admitting: Oncology

## 2012-07-30 ENCOUNTER — Encounter: Payer: Self-pay | Admitting: Oncology

## 2012-07-30 VITALS — BP 135/85 | HR 74 | Temp 98.4°F | Resp 20 | Ht 70.0 in | Wt 219.3 lb

## 2012-07-30 DIAGNOSIS — C9110 Chronic lymphocytic leukemia of B-cell type not having achieved remission: Secondary | ICD-10-CM

## 2012-07-30 LAB — CBC WITH DIFFERENTIAL/PLATELET
Basophils Absolute: 0.1 10*3/uL (ref 0.0–0.1)
Eosinophils Absolute: 0.1 10*3/uL (ref 0.0–0.5)
HCT: 43.1 % (ref 38.4–49.9)
HGB: 13.9 g/dL (ref 13.0–17.1)
MCV: 83.9 fL (ref 79.3–98.0)
NEUT#: 2.7 10*3/uL (ref 1.5–6.5)
RDW: 15.3 % — ABNORMAL HIGH (ref 11.0–14.6)
lymph#: 14.3 10*3/uL — ABNORMAL HIGH (ref 0.9–3.3)

## 2012-07-30 LAB — COMPREHENSIVE METABOLIC PANEL (CC13)
ALT: 36 U/L (ref 0–55)
AST: 28 U/L (ref 5–34)
Alkaline Phosphatase: 46 U/L (ref 40–150)
Creatinine: 0.9 mg/dL (ref 0.7–1.3)
Total Bilirubin: 0.92 mg/dL (ref 0.20–1.20)

## 2012-07-30 NOTE — Progress Notes (Signed)
OFFICE PROGRESS NOTE  CC  [name], MD [address] [place] [address]  DIAGNOSIS: is 67 year old gentleman with stage 0 CLL  PRIOR THERAPY:observation  CURRENT THERAPY:observation  INTERVAL HISTORY: DEVEION DENZ 67 y.o. male returns for followup visit. Overall he feels well.Otherwise he is feeling well he has not had any fevers chills night sweats any infections no recent pneumonias or bronchitis. He denies having any bleeding problems no hematuria hematochezia melena hemoptysis or hematemesis. Remainder of the 10 point review of systems is negative.[name] MEDICAL HISTORY: Past Medical History  Diagnosis Date  . Atrial tachycardia     s/p DCCV 09/2009, s/p RFCA 03/2011, s/p DCCV 04/2011, s/p RFCA 09/17/11  . Coronary artery disease     Status post CABG in 1994  . CLL (chronic lymphoblastic leukemia)     Stage 0-1  . Hypercholesterolemia     Well Controlled  . Hypertension   . Cough     Consistant with ACE inhibitor mediated cough  . Malaria 1972    Hx of  . Sleep-disordered breathing 06/20/2011  . History of tobacco abuse     quit 1994    ALLERGIES:  is allergic to penicillins; ace inhibitors; darvocet; and sulfa antibiotics.  MEDICATIONS:  Current Outpatient Prescriptions  Medication Sig Dispense Refill  . aspirin 81 MG tablet Take 81 mg by mouth daily.      [name] docusate sodium (COLACE) 100 MG capsule Take 100 mg by mouth 2 (two) times daily.      [name] ezetimibe-simvastatin (VYTORIN) 10-20 MG per tablet Take 1 tablet by mouth at bedtime.  90 tablet  1  . Fiber CHEW Chew 2 tablets by mouth every morning.      . latanoprost (XALATAN) 0.005 % ophthalmic solution Place 1 drop into both eyes at bedtime.       [name] losartan (COZAAR) 50 MG tablet Take 1 tablet (50 mg total) by mouth daily.  90 tablet  1  . metoprolol succinate (TOPROL-XL) 25 MG 24 hr tablet Take 1 tablet (25 mg total) by mouth daily.  90 tablet  0  . niacin (NIASPAN) 750 MG CR tablet Take 2 tablets (1,500  mg total) by mouth at bedtime.  180 tablet  1  . pramipexole (MIRAPEX) 0.125 MG tablet Take 0.125 mg by mouth daily as needed. For legs      . RABEprazole (ACIPHEX) 20 MG tablet Take 1 tablet (20 mg total) by mouth daily.  90 tablet  3  . traMADol (ULTRAM) 50 MG tablet Take 1 tablet (50 mg total) by mouth every 6 (six) hours as needed for pain.  90 tablet  6  . lidocaine (LIDODERM) 5 % Place 1 patch onto the skin daily as needed. Remove & Discard patch within 12 hours or as directed by MD, for left shoulder pain       No current facility-administered medications for this visit.    SURGICAL HISTORY:  Past Surgical History  Procedure Laterality Date  . Coronary artery bypass graft  1994    By Dr. Laneta Simmers. This included an LIMA graft to the left circumflex coronary, a sequential saphenous vein graft to the diagonal and the LAD, and a vein graft to the posterior descending and posterior lateral branches of the coronary artery.  . US echocardiography  09-07-09    Est EF 40-45%  . Cardioversion  06/03/2011    Procedure: CARDIOVERSION;  Surgeon: Duke Salvia, MD;  Location: [facility] OR;  Service: Cardiovascular;  Laterality:  N/A;  . Ep study and ablation  2013    by Dr Graciela Husbands, repeat ablation by Dr Johney Frame    REVIEW OF SYSTEMS:  Pertinent items are noted in HPI.   PHYSICAL EXAMINATION: Well-developed well-nourished male in no acute distress HEENT exam EOMI PERRLA sclerae anicteric no conjunctival pallor oral mucosa is moist neck is supple lungs clear cardiovascular regular rate rhythm no murmurs abdomen is soft nontender no HSM extremities no edema neuro patient's alert oriented otherwise nonfocal, lymph node survey negative for cervical supraclavicular or axillary adenopathy  ECOG PERFORMANCE STATUS: 0 - Asymptomatic  Blood pressure 135/85, pulse 74, temperature 98.4 F (36.9 C), temperature source Oral, resp. rate 20, height 5\' 10"  (1.778 m), weight 219 lb 4.8 oz (99.474 kg).  LABORATORY DATA: Lab  Results  Component Value Date   WBC 17.6* 07/30/2012   HGB 13.9 07/30/2012   HCT 43.1 07/30/2012   MCV 83.9 07/30/2012   PLT 109* 07/30/2012      Chemistry      Component Value Date/Time   NA 140 07/30/2012 1114   NA 140 09/12/2011 0824   K 4.5 07/30/2012 1114   K 4.6 09/12/2011 0824   CL 105 01/02/2012 0938   CL 106 09/12/2011 0824   CO2 26 07/30/2012 1114   CO2 28 09/12/2011 0824   BUN 17.7 07/30/2012 1114   BUN 11 09/12/2011 0824   CREATININE 0.9 07/30/2012 1114   CREATININE 1.05 09/12/2011 0824      Component Value Date/Time   CALCIUM 9.5 07/30/2012 1114   CALCIUM 9.0 09/12/2011 0824   ALKPHOS 46 07/30/2012 1114   ALKPHOS 41 09/12/2011 0824   AST 28 07/30/2012 1114   AST 23 09/12/2011 0824   ALT 36 07/30/2012 1114   ALT 30 09/12/2011 0824   BILITOT 0.92 07/30/2012 1114   BILITOT 0.9 09/12/2011 0824       RADIOGRAPHIC STUDIES:  No results found.  ASSESSMENT: 67 year old gentleman with  #1 CLL stage 0 essentially remaining stable.  #2 atrial fibrillation.  #3 thrombocytopenia secondary to #1We will continue to monitor this.  PLAN:   #1 patient will continue to be observed for his CLL no acute intervention is needed.  #2 I will plan on seeing the patient back in 6 months   All questions were answered. The patient knows to call the clinic with any problems, questions or concerns. We can certainly see the patient much sooner if necessary.  I spent 25 minutes counseling the patient face to face. The total time spent in the appointment was 30 minutes.    Drue Second, MD Medical/Oncology [facility] [phone] (beeper) [phone] (Office)  07/30/2012, 12:46 PM

## 2012-07-30 NOTE — Telephone Encounter (Signed)
, °

## 2012-07-30 NOTE — Patient Instructions (Addendum)
Doing well  Your blood count is stable  Lab Results  Component Value Date   WBC 17.6* 07/30/2012   HGB 13.9 07/30/2012   HCT 43.1 07/30/2012   MCV 83.9 07/30/2012   PLT 109* 07/30/2012     I will see you back in 6 months

## 2012-07-31 LAB — IGG, IGA, IGM
IgA: 116 mg/dL (ref 68–379)
IgM, Serum: 34 mg/dL — ABNORMAL LOW (ref 41–251)

## 2012-07-31 LAB — DIRECT ANTIGLOBULIN TEST (NOT AT ARMC)
DAT (Complement): NEGATIVE
DAT IgG: NEGATIVE

## 2012-08-12 ENCOUNTER — Encounter: Payer: Self-pay | Admitting: Cardiology

## 2012-08-12 ENCOUNTER — Telehealth: Payer: Self-pay | Admitting: *Deleted

## 2012-08-12 ENCOUNTER — Ambulatory Visit (INDEPENDENT_AMBULATORY_CARE_PROVIDER_SITE_OTHER): Payer: Medicare Other | Admitting: Cardiology

## 2012-08-12 VITALS — BP 124/60 | HR 70 | Ht 70.0 in | Wt 218.0 lb

## 2012-08-12 DIAGNOSIS — I2510 Atherosclerotic heart disease of native coronary artery without angina pectoris: Secondary | ICD-10-CM | POA: Diagnosis not present

## 2012-08-12 DIAGNOSIS — I4892 Unspecified atrial flutter: Secondary | ICD-10-CM | POA: Diagnosis not present

## 2012-08-12 DIAGNOSIS — I255 Ischemic cardiomyopathy: Secondary | ICD-10-CM

## 2012-08-12 DIAGNOSIS — I509 Heart failure, unspecified: Secondary | ICD-10-CM

## 2012-08-12 DIAGNOSIS — I5022 Chronic systolic (congestive) heart failure: Secondary | ICD-10-CM

## 2012-08-12 DIAGNOSIS — I10 Essential (primary) hypertension: Secondary | ICD-10-CM

## 2012-08-12 DIAGNOSIS — I2589 Other forms of chronic ischemic heart disease: Secondary | ICD-10-CM | POA: Diagnosis not present

## 2012-08-12 MED ORDER — METOPROLOL SUCCINATE ER 25 MG PO TB24: 25.0000 mg | ORAL_TABLET | Freq: Every day | ORAL | Status: DC

## 2012-08-12 MED ORDER — NIACIN ER (ANTIHYPERLIPIDEMIC) 750 MG PO TBCR: 1500.0000 mg | EXTENDED_RELEASE_TABLET | Freq: Every day | ORAL | Status: DC

## 2012-08-12 MED ORDER — EZETIMIBE-SIMVASTATIN 10-20 MG PO TABS: 1.0000 | ORAL_TABLET | Freq: Every day | ORAL | Status: DC

## 2012-08-12 MED ORDER — LOSARTAN POTASSIUM 50 MG PO TABS: 50.0000 mg | ORAL_TABLET | Freq: Every day | ORAL | Status: DC

## 2012-08-12 MED ORDER — TRAMADOL HCL 50 MG PO TABS: 50.0000 mg | ORAL_TABLET | Freq: Four times a day (QID) | ORAL | Status: DC | PRN

## 2012-08-12 NOTE — Telephone Encounter (Signed)
Faxed signed refill request for Tramadol to [facility].

## 2012-08-12 NOTE — Progress Notes (Signed)
Rodney Burns Date of Birth: 02/04/1945   History of Present Illness: Rodney Burns is seen for followup today. He has a history of atrial flutter and has had multiple cardioversions. He had radiofrequency ablation in March and then again in August of 2013. He's had no recurrent symptomatic episodes since then. He has a history of coronary disease and is status post CABG in 1994. He had a nuclear stress test in January 2013 which showed an inferior lateral infarct without evidence of ischemia. Ejection fraction was 39%. By echocardiogram his ejection fraction was 40-45%. He also has a history of CLL. On followup today he reports he is doing well. He remains reactive. He does note that he has more fatigued with higher levels of physical activity. He just seems to run out of energy. He denies any dyspnea, chest pain, or palpitations.   Current Outpatient Prescriptions on File Prior to Visit  Medication Sig Dispense Refill  . aspirin 81 MG tablet Take 81 mg by mouth daily.      [name] docusate sodium (COLACE) 100 MG capsule Take 100 mg by mouth 2 (two) times daily.      . Fiber CHEW Chew 2 tablets by mouth every morning.      . latanoprost (XALATAN) 0.005 % ophthalmic solution Place 1 drop into both eyes at bedtime.       . lidocaine (LIDODERM) 5 % Place 1 patch onto the skin daily as needed. Remove & Discard patch within 12 hours or as directed by MD, for left shoulder pain      . pramipexole (MIRAPEX) 0.125 MG tablet Take 0.125 mg by mouth at bedtime. For legs      . RABEprazole (ACIPHEX) 20 MG tablet Take 1 tablet (20 mg total) by mouth daily.  90 tablet  3  . traMADol (ULTRAM) 50 MG tablet Take 1 tablet (50 mg total) by mouth every 6 (six) hours as needed for pain.  90 tablet  6   No current facility-administered medications on file prior to visit.    Allergies  Allergen Reactions  . Penicillins Other (See Comments)    Put pt in hospital 50 yrs ago  . Ace Inhibitors     Cause a Cough  . Darvocet  (Propoxyphene-Acetaminophen) Rash    Tolerates tylenol  . Sulfa Antibiotics Rash    Past Medical History  Diagnosis Date  . Atrial tachycardia     s/p DCCV 09/2009, s/p RFCA 03/2011, s/p DCCV 04/2011, s/p RFCA 09/17/11  . Coronary artery disease     Status post CABG in 1994  . CLL (chronic lymphoblastic leukemia)     Stage 0-1  . Hypercholesterolemia     Well Controlled  . Hypertension   . Cough     Consistant with ACE inhibitor mediated cough  . Malaria 1972    Hx of  . Sleep-disordered breathing 06/20/2011  . History of tobacco abuse     quit 1994    Past Surgical History  Procedure Laterality Date  . Coronary artery bypass graft  1994    By Dr. Laneta Simmers. This included an LIMA graft to the left circumflex coronary, a sequential saphenous vein graft to the diagonal and the LAD, and a vein graft to the posterior descending and posterior lateral branches of the coronary artery.  . US echocardiography  09-07-09    Est EF 40-45%  . Cardioversion  06/03/2011    Procedure: CARDIOVERSION;  Surgeon: Duke Salvia, MD;  Location: [facility] OR;  Service: Cardiovascular;  Laterality: N/A;  . Ep study and ablation  2013    by Dr Graciela Husbands, repeat ablation by Dr Johney Frame    History  Smoking status  . Former Smoker  . Quit date: 01/22/1992  Smokeless tobacco  . Not on file    History  Alcohol Use No    History reviewed. No pertinent family history.  Review of Systems: As noted in history of present illness.  All other systems were reviewed and are negative.  Physical Exam: BP 124/60  Pulse 70  Ht 5\' 10"  (1.778 m)  Wt 218 lb (98.884 kg)  BMI 31.28 kg/m2  SpO2 96% He is a pleasant white male in no acute distress. He is normocephalic, atraumatic. Pupils are equal round and reactive to light accommodation. Extraocular movements are full. Oropharynx is clear. Neck is supple without JVD, adenopathy, or megaly, or bruits. Lungs are clear. Cardiac exam reveals an irregular rate and rhythm without  gallop, murmur, or click. Abdomen is obese, soft, nontender. He has good pedal pulses. He has no edema. He is alert and oriented x3. Cranial nerves II through XII are intact. LABORATORY DATA: Lab Results  Component Value Date   WBC 17.6* 07/30/2012   HGB 13.9 07/30/2012   HCT 43.1 07/30/2012   PLT 109* 07/30/2012   GLUCOSE 120 07/30/2012   ALT 36 07/30/2012   AST 28 07/30/2012   NA 140 07/30/2012   K 4.5 07/30/2012   CL 105 01/02/2012   CREATININE 0.9 07/30/2012   BUN 17.7 07/30/2012   CO2 26 07/30/2012   INR 1.5* 04/09/2011     Assessment / Plan: 1. Recurrent atrial flutter. Now status post radiofrequency ablation x2. Last was in August of 2013. No evidence of recurrence. We will continue with aspirin at this point. Continue metoprolol.  2. Coronary disease status post CABG in 1994. Last nuclear stress test in January 2013 showed an inferior lateral infarct without ischemia. Patient is asymptomatic. Continue aspirin and beta blocker therapy.  3. Chronic congestive heart failure. Ejection fraction of approximately 40%. No evidence of volume overload. Continue ARB and beta blocker therapy. I do not think he needs diuretic therapy at this point. His weight is actually down 6 pounds.  4. CLL stage 0.  5. Hypertension, controlled.   Have requested a copy of his most recent lab work from his primary care. We'll continue his current therapy and followup again in 6 months.

## 2012-08-12 NOTE — Patient Instructions (Signed)
Continue your current therapy  I will see you again in 6 months.   

## 2012-09-01 ENCOUNTER — Other Ambulatory Visit: Payer: Self-pay | Admitting: Cardiology

## 2012-09-25 ENCOUNTER — Other Ambulatory Visit: Payer: Self-pay | Admitting: Cardiology

## 2012-10-04 ENCOUNTER — Other Ambulatory Visit: Payer: Self-pay | Admitting: Cardiology

## 2012-10-22 ENCOUNTER — Telehealth: Payer: Self-pay | Admitting: Oncology

## 2012-10-22 ENCOUNTER — Ambulatory Visit (HOSPITAL_BASED_OUTPATIENT_CLINIC_OR_DEPARTMENT_OTHER): Payer: Medicare Other

## 2012-10-22 VITALS — BP 114/63 | HR 75 | Temp 97.9°F

## 2012-10-22 DIAGNOSIS — Z23 Encounter for immunization: Secondary | ICD-10-CM

## 2012-10-22 MED ORDER — INFLUENZA VAC SPLIT QUAD 0.5 ML IM SUSP
0.5000 mL | Freq: Once | INTRAMUSCULAR | Status: AC
  Administered 2012-10-22: 0.5 mL via INTRAMUSCULAR
  Filled 2012-10-22: qty 0.5

## 2012-10-22 NOTE — Patient Instructions (Addendum)
Influenza Virus Vaccine injection (Fluarix) What is this medicine? INFLUENZA VIRUS VACCINE (in floo EN zuh VAHY ruhs vak SEEN) helps to reduce the risk of getting influenza also known as the flu. This medicine may be used for other purposes; ask your health care provider or pharmacist if you have questions. What should I tell my health care provider before I take this medicine? They need to know if you have any of these conditions: -bleeding disorder like hemophilia -fever or infection -Guillain-Barre syndrome or other neurological problems -immune system problems -infection with the human immunodeficiency virus (HIV) or AIDS -low blood platelet counts -multiple sclerosis -an unusual or allergic reaction to influenza virus vaccine, eggs, chicken proteins, latex, gentamicin, other medicines, foods, dyes or preservatives -pregnant or trying to get pregnant -breast-feeding How should I use this medicine? This vaccine is for injection into a muscle. It is given by a health care professional. A copy of Vaccine Information Statements will be given before each vaccination. Read this sheet carefully each time. The sheet may change frequently. Talk to your pediatrician regarding the use of this medicine in children. Special care may be needed. Overdosage: If you think you have taken too much of this medicine contact a poison control center or emergency room at once. NOTE: This medicine is only for you. Do not share this medicine with others. What if I miss a dose? This does not apply. What may interact with this medicine? -chemotherapy or radiation therapy -medicines that lower your immune system like etanercept, anakinra, infliximab, and adalimumab -medicines that treat or prevent blood clots like warfarin -phenytoin -steroid medicines like prednisone or cortisone -theophylline -vaccines This list may not describe all possible interactions. Give your health care provider a list of all the  medicines, herbs, non-prescription drugs, or dietary supplements you use. Also tell them if you smoke, drink alcohol, or use illegal drugs. Some items may interact with your medicine. What should I watch for while using this medicine? Report any side effects that do not go away within 3 days to your doctor or health care professional. Call your health care provider if any unusual symptoms occur within 6 weeks of receiving this vaccine. You may still catch the flu, but the illness is not usually as bad. You cannot get the flu from the vaccine. The vaccine will not protect against colds or other illnesses that may cause fever. The vaccine is needed every year. What side effects may I notice from receiving this medicine? Side effects that you should report to your doctor or health care professional as soon as possible: -allergic reactions like skin rash, itching or hives, swelling of the face, lips, or tongue Side effects that usually do not require medical attention (report to your doctor or health care professional if they continue or are bothersome): -fever -headache -muscle aches and pains -pain, tenderness, redness, or swelling at site where injected -weak or tired This list may not describe all possible side effects. Call your doctor for medical advice about side effects. You may report side effects to FDA at 1-800-FDA-1088. Where should I keep my medicine? This vaccine is only given in a clinic, pharmacy, doctor's office, or other health care setting and will not be stored at home. NOTE: This sheet is a summary. It may not cover all possible information. If you have questions about this medicine, talk to your doctor, pharmacist, or health care provider.  2013, [employer]. (08/05/2007 9:30:40 AM)

## 2012-11-05 ENCOUNTER — Telehealth: Payer: Self-pay | Admitting: Cardiology

## 2012-11-05 MED ORDER — METOPROLOL SUCCINATE ER 25 MG PO TB24: 25.0000 mg | ORAL_TABLET | Freq: Every day | ORAL | Status: DC

## 2012-11-05 NOTE — Telephone Encounter (Signed)
New problem   Pt needs a script Metoporolo for [facility] but needs to speak to you.

## 2012-11-05 NOTE — Telephone Encounter (Signed)
Returned call to patient metoprolol refill sent to pharmacy. 

## 2012-11-11 DIAGNOSIS — M19049 Primary osteoarthritis, unspecified hand: Secondary | ICD-10-CM | POA: Diagnosis not present

## 2012-11-11 DIAGNOSIS — M67919 Unspecified disorder of synovium and tendon, unspecified shoulder: Secondary | ICD-10-CM | POA: Diagnosis not present

## 2012-11-17 ENCOUNTER — Other Ambulatory Visit: Payer: Self-pay | Admitting: Cardiology

## 2012-11-25 ENCOUNTER — Other Ambulatory Visit: Payer: Self-pay | Admitting: *Deleted

## 2012-11-25 ENCOUNTER — Other Ambulatory Visit: Payer: Self-pay

## 2012-11-25 MED ORDER — LOSARTAN POTASSIUM 50 MG PO TABS: ORAL_TABLET | ORAL | Status: DC

## 2012-11-25 MED ORDER — EZETIMIBE-SIMVASTATIN 10-20 MG PO TABS: 1.0000 | ORAL_TABLET | Freq: Every day | ORAL | Status: DC

## 2012-11-25 MED ORDER — LOSARTAN POTASSIUM 50 MG PO TABS: 50.0000 mg | ORAL_TABLET | Freq: Every day | ORAL | Status: DC

## 2012-11-27 ENCOUNTER — Other Ambulatory Visit: Payer: Self-pay

## 2012-11-27 MED ORDER — METOPROLOL SUCCINATE ER 25 MG PO TB24: 25.0000 mg | ORAL_TABLET | Freq: Every day | ORAL | Status: DC

## 2012-12-06 ENCOUNTER — Other Ambulatory Visit: Payer: Self-pay | Admitting: Cardiology

## 2012-12-08 ENCOUNTER — Other Ambulatory Visit: Payer: Self-pay | Admitting: *Deleted

## 2012-12-08 MED ORDER — EZETIMIBE-SIMVASTATIN 10-20 MG PO TABS: 1.0000 | ORAL_TABLET | Freq: Every day | ORAL | Status: DC

## 2013-01-28 ENCOUNTER — Telehealth: Payer: Self-pay | Admitting: Oncology

## 2013-01-28 ENCOUNTER — Ambulatory Visit (HOSPITAL_BASED_OUTPATIENT_CLINIC_OR_DEPARTMENT_OTHER): Payer: Medicare Other | Admitting: Oncology

## 2013-01-28 ENCOUNTER — Other Ambulatory Visit (HOSPITAL_BASED_OUTPATIENT_CLINIC_OR_DEPARTMENT_OTHER): Payer: Medicare Other

## 2013-01-28 VITALS — BP 129/75 | HR 77 | Temp 98.1°F | Resp 18 | Ht 70.0 in | Wt 221.8 lb

## 2013-01-28 DIAGNOSIS — I4891 Unspecified atrial fibrillation: Secondary | ICD-10-CM

## 2013-01-28 DIAGNOSIS — D6959 Other secondary thrombocytopenia: Secondary | ICD-10-CM

## 2013-01-28 DIAGNOSIS — C9110 Chronic lymphocytic leukemia of B-cell type not having achieved remission: Secondary | ICD-10-CM

## 2013-01-28 DIAGNOSIS — I10 Essential (primary) hypertension: Secondary | ICD-10-CM

## 2013-01-28 DIAGNOSIS — I2510 Atherosclerotic heart disease of native coronary artery without angina pectoris: Secondary | ICD-10-CM | POA: Diagnosis not present

## 2013-01-28 LAB — COMPREHENSIVE METABOLIC PANEL (CC13)
ALT: 43 U/L (ref 0–55)
AST: 30 U/L (ref 5–34)
Albumin: 4.2 g/dL (ref 3.5–5.0)
Alkaline Phosphatase: 43 U/L (ref 40–150)
Anion Gap: 9 mEq/L (ref 3–11)
BUN: 13.3 mg/dL (ref 7.0–26.0)
CALCIUM: 9.3 mg/dL (ref 8.4–10.4)
CHLORIDE: 108 meq/L (ref 98–109)
CO2: 24 meq/L (ref 22–29)
Creatinine: 0.9 mg/dL (ref 0.7–1.3)
Glucose: 115 mg/dl (ref 70–140)
Potassium: 4.4 mEq/L (ref 3.5–5.1)
SODIUM: 141 meq/L (ref 136–145)
TOTAL PROTEIN: 6.7 g/dL (ref 6.4–8.3)
Total Bilirubin: 0.99 mg/dL (ref 0.20–1.20)

## 2013-01-28 LAB — CBC WITH DIFFERENTIAL/PLATELET
BASO%: 0.1 % (ref 0.0–2.0)
Basophils Absolute: 0 10*3/uL (ref 0.0–0.1)
EOS ABS: 0.1 10*3/uL (ref 0.0–0.5)
EOS%: 0.7 % (ref 0.0–7.0)
HEMATOCRIT: 45.2 % (ref 38.4–49.9)
HGB: 14.5 g/dL (ref 13.0–17.1)
LYMPH#: 17.3 10*3/uL — AB (ref 0.9–3.3)
LYMPH%: 78.1 % — ABNORMAL HIGH (ref 14.0–49.0)
MCH: 27.5 pg (ref 27.2–33.4)
MCHC: 32 g/dL (ref 32.0–36.0)
MCV: 86 fL (ref 79.3–98.0)
MONO#: 0.6 10*3/uL (ref 0.1–0.9)
MONO%: 2.5 % (ref 0.0–14.0)
NEUT%: 18.6 % — AB (ref 39.0–75.0)
NEUTROS ABS: 4.1 10*3/uL (ref 1.5–6.5)
Platelets: 109 10*3/uL — ABNORMAL LOW (ref 140–400)
RBC: 5.26 10*6/uL (ref 4.20–5.82)
RDW: 14.7 % — ABNORMAL HIGH (ref 11.0–14.6)
WBC: 22.2 10*3/uL — AB (ref 4.0–10.3)

## 2013-01-28 LAB — TECHNOLOGIST REVIEW

## 2013-01-29 ENCOUNTER — Encounter: Payer: Self-pay | Admitting: Oncology

## 2013-01-29 LAB — IGG, IGA, IGM
IgA: 111 mg/dL (ref 68–379)
IgG (Immunoglobin G), Serum: 652 mg/dL (ref 650–1600)
IgM, Serum: 35 mg/dL — ABNORMAL LOW (ref 41–251)

## 2013-01-29 LAB — HAPTOGLOBIN: Haptoglobin: 86 mg/dL (ref 45–215)

## 2013-01-29 NOTE — Progress Notes (Signed)
OFFICE PROGRESS NOTE  CC  [name], MD [place] [address]  DIAGNOSIS: is 68 year old gentleman with stage 0 CLL  PRIOR THERAPY:observation  CURRENT THERAPY:observation  INTERVAL HISTORY: Rodney Burns 68 y.o. male returns for followup visit. Overall he feels well.Otherwise he is feeling well he has not had any fevers chills night sweats any infections no recent pneumonias or bronchitis. He denies having any bleeding problems no hematuria hematochezia melena hemoptysis or hematemesis. Remainder of the 10 point review of systems is negative.[name] MEDICAL HISTORY: Past Medical History  Diagnosis Date  . Atrial tachycardia     s/p DCCV 09/2009, s/p RFCA 03/2011, s/p DCCV 04/2011, s/p RFCA 09/17/11  . Coronary artery disease     Status post CABG in 1994  . CLL (chronic lymphoblastic leukemia)     Stage 0-1  . Hypercholesterolemia     Well Controlled  . Hypertension   . Cough     Consistant with ACE inhibitor mediated cough  . Malaria 1972    Hx of  . Sleep-disordered breathing 06/20/2011  . History of tobacco abuse     quit 1994    ALLERGIES:  is allergic to penicillins; ace inhibitors; darvocet; and sulfa antibiotics.  MEDICATIONS:  Current Outpatient Prescriptions  Medication Sig Dispense Refill  . aspirin 81 MG tablet Take 81 mg by mouth daily.      [name] docusate sodium (COLACE) 100 MG capsule Take 100 mg by mouth 2 (two) times daily.      [name] ezetimibe-simvastatin (VYTORIN) 10-20 MG per tablet Take 1 tablet by mouth at bedtime.  90 tablet  2  . Fiber CHEW Chew 2 tablets by mouth every morning.      . latanoprost (XALATAN) 0.005 % ophthalmic solution Place 1 drop into both eyes at bedtime.       . lidocaine (LIDODERM) 5 % Place 1 patch onto the skin daily as needed. Remove & Discard patch within 12 hours or as directed by MD, for left shoulder pain      . losartan (COZAAR) 50 MG tablet TAKE 1 TABLET DAILY  30 tablet  0  . losartan (COZAAR) 50 MG  tablet Take 1 tablet (50 mg total) by mouth daily.  90 tablet  0  . losartan (COZAAR) 50 MG tablet TAKE 1 TABLET DAILY  90 tablet  0  . metoprolol succinate (TOPROL-XL) 25 MG 24 hr tablet Take 1 tablet (25 mg total) by mouth daily.  90 tablet  3  . NIASPAN 750 MG CR tablet TAKE 2 TABLETS (1500 MG TOTAL) AT BEDTIME  180 tablet  1  . pramipexole (MIRAPEX) 0.125 MG tablet Take 0.125 mg by mouth at bedtime. For legs      . RABEprazole (ACIPHEX) 20 MG tablet Take 1 tablet (20 mg total) by mouth daily.  90 tablet  3  . traMADol (ULTRAM) 50 MG tablet Take 1 tablet (50 mg total) by mouth every 6 (six) hours as needed for pain.  90 tablet  5   No current facility-administered medications for this visit.    SURGICAL HISTORY:  Past Surgical History  Procedure Laterality Date  . Coronary artery bypass graft  1994    By Dr. Cyndia Bent. This included an LIMA graft to the left circumflex coronary, a sequential saphenous vein graft to the diagonal and the LAD, and a vein graft to the posterior descending and posterior lateral branches of the coronary artery.  . US echocardiography  09-07-09  Est EF 40-45%  . Cardioversion  06/03/2011    Procedure: CARDIOVERSION;  Surgeon: Deboraha Sprang, MD;  Location: [facility];  Service: Cardiovascular;  Laterality: N/A;  . Ep study and ablation  2013    by Dr Caryl Comes, repeat ablation by Dr Rayann Heman    REVIEW OF SYSTEMS:  Pertinent items are noted in HPI.   PHYSICAL EXAMINATION: Well-developed well-nourished male in no acute distress HEENT exam EOMI PERRLA sclerae anicteric no conjunctival pallor oral mucosa is moist neck is supple lungs clear cardiovascular regular rate rhythm no murmurs abdomen is soft nontender no HSM extremities no edema neuro patient's alert oriented otherwise nonfocal, lymph node survey negative for cervical supraclavicular or axillary adenopathy  ECOG PERFORMANCE STATUS: 0 - Asymptomatic  Blood pressure 129/75, pulse 77, temperature 98.1 F (36.7 C),  temperature source Oral, resp. rate 18, height 5\' 10"  (1.778 m), weight 221 lb 12.8 oz (100.608 kg).  LABORATORY DATA: Lab Results  Component Value Date   WBC 22.2* 01/28/2013   HGB 14.5 01/28/2013   HCT 45.2 01/28/2013   MCV 86.0 01/28/2013   PLT 109* 01/28/2013      Chemistry      Component Value Date/Time   NA 141 01/28/2013 1035   NA 140 09/12/2011 0824   K 4.4 01/28/2013 1035   K 4.6 09/12/2011 0824   CL 105 01/02/2012 0938   CL 106 09/12/2011 0824   CO2 24 01/28/2013 1035   CO2 28 09/12/2011 0824   BUN 13.3 01/28/2013 1035   BUN 11 09/12/2011 0824   CREATININE 0.9 01/28/2013 1035   CREATININE 1.05 09/12/2011 0824      Component Value Date/Time   CALCIUM 9.3 01/28/2013 1035   CALCIUM 9.0 09/12/2011 0824   ALKPHOS 43 01/28/2013 1035   ALKPHOS 41 09/12/2011 0824   AST 30 01/28/2013 1035   AST 23 09/12/2011 0824   ALT 43 01/28/2013 1035   ALT 30 09/12/2011 0824   BILITOT 0.99 01/28/2013 1035   BILITOT 0.9 09/12/2011 0824       RADIOGRAPHIC STUDIES:  No results found.  ASSESSMENT: 68 year old gentleman with  #1 CLL stage 0 essentially remaining stable.  #2 atrial fibrillation.  #3 thrombocytopenia secondary to #1We will continue to monitor this.  PLAN:   #1 patient will continue to be observed for his CLL no acute intervention is needed.  #2 I will plan on seeing the patient back in 6 months   All questions were answered. The patient knows to call the clinic with any problems, questions or concerns. We can certainly see the patient much sooner if necessary.  I spent 25 minutes counseling the patient face to face. The total time spent in the appointment was 30 minutes.    Marcy Panning, MD Medical/Oncology [facility] [phone] (beeper) [phone] (Office)  01/29/2013, 4:39 PM

## 2013-02-04 ENCOUNTER — Ambulatory Visit: Payer: Medicare Other | Admitting: Oncology

## 2013-02-04 ENCOUNTER — Other Ambulatory Visit: Payer: Medicare Other | Admitting: Lab

## 2013-02-17 ENCOUNTER — Other Ambulatory Visit: Payer: Self-pay | Admitting: Cardiology

## 2013-02-18 ENCOUNTER — Encounter: Payer: Self-pay | Admitting: Cardiology

## 2013-02-18 ENCOUNTER — Ambulatory Visit (INDEPENDENT_AMBULATORY_CARE_PROVIDER_SITE_OTHER): Payer: Medicare Other | Admitting: Cardiology

## 2013-02-18 VITALS — BP 128/68 | HR 74 | Ht 70.0 in | Wt 221.8 lb

## 2013-02-18 DIAGNOSIS — I4892 Unspecified atrial flutter: Secondary | ICD-10-CM

## 2013-02-18 DIAGNOSIS — I2510 Atherosclerotic heart disease of native coronary artery without angina pectoris: Secondary | ICD-10-CM

## 2013-02-18 DIAGNOSIS — C9110 Chronic lymphocytic leukemia of B-cell type not having achieved remission: Secondary | ICD-10-CM

## 2013-02-18 DIAGNOSIS — I5022 Chronic systolic (congestive) heart failure: Secondary | ICD-10-CM

## 2013-02-18 DIAGNOSIS — E7800 Pure hypercholesterolemia, unspecified: Secondary | ICD-10-CM | POA: Diagnosis not present

## 2013-02-18 DIAGNOSIS — I509 Heart failure, unspecified: Secondary | ICD-10-CM

## 2013-02-18 MED ORDER — RIVAROXABAN 20 MG PO TABS: 20.0000 mg | ORAL_TABLET | Freq: Every day | ORAL | Status: DC

## 2013-02-18 NOTE — Progress Notes (Signed)
Rodney Burns Date of Birth: September 23, 1945   History of Present Illness: Rodney Burns is seen for followup today. He has a history of atrial flutter and has had multiple cardioversions. He had radiofrequency ablation in March and then again in August of 2013. He's had no recurrent symptomatic episodes since then. He has a history of coronary disease and is status post CABG in 1994. He had a nuclear stress test in January 2013 which showed an inferior lateral infarct without evidence of ischemia. Ejection fraction was 39%. By echocardiogram his ejection fraction was 40-45%. He also has a history of CLL. On followup today he reports he is doing well.  He denies any dyspnea, chest pain, or palpitations. His energy level is good.  Current Outpatient Prescriptions on File Prior to Visit  Medication Sig Dispense Refill  . docusate sodium (COLACE) 100 MG capsule Take 100 mg by mouth 2 (two) times daily.      [name] ezetimibe-simvastatin (VYTORIN) 10-20 MG per tablet Take 1 tablet by mouth at bedtime.  90 tablet  2  . Fiber CHEW Chew 2 tablets by mouth every morning.      . latanoprost (XALATAN) 0.005 % ophthalmic solution Place 1 drop into both eyes at bedtime.       . lidocaine (LIDODERM) 5 % Place 1 patch onto the skin daily as needed. Remove & Discard patch within 12 hours or as directed by MD, for left shoulder pain      . losartan (COZAAR) 50 MG tablet TAKE 1 TABLET DAILY (NEED APPOINTMENT IN JANUARY)  90 tablet  0  . metoprolol succinate (TOPROL-XL) 25 MG 24 hr tablet Take 1 tablet (25 mg total) by mouth daily.  90 tablet  3  . NIASPAN 750 MG CR tablet TAKE 2 TABLETS (1500 MG TOTAL) AT BEDTIME  180 tablet  1  . pramipexole (MIRAPEX) 0.125 MG tablet Take 0.125 mg by mouth at bedtime. For legs      . RABEprazole (ACIPHEX) 20 MG tablet Take 1 tablet (20 mg total) by mouth daily.  90 tablet  3  . traMADol (ULTRAM) 50 MG tablet Take 1 tablet (50 mg total) by mouth every 6 (six) hours as needed for pain.  90 tablet  5    No current facility-administered medications on file prior to visit.    Allergies  Allergen Reactions  . Penicillins Other (See Comments)    Put pt in hospital 50 yrs ago  . Ace Inhibitors     Cause a Cough  . Darvocet [Propoxyphene N-Acetaminophen] Rash    Tolerates tylenol  . Sulfa Antibiotics Rash    Past Medical History  Diagnosis Date  . Atrial tachycardia     s/p DCCV 09/2009, s/p RFCA 03/2011, s/p DCCV 04/2011, s/p RFCA 09/17/11  . Coronary artery disease     Status post CABG in 1994  . CLL (chronic lymphoblastic leukemia)     Stage 0-1  . Hypercholesterolemia     Well Controlled  . Hypertension   . Cough     Consistant with ACE inhibitor mediated cough  . Malaria 1972    Hx of  . Sleep-disordered breathing 06/20/2011  . History of tobacco abuse     quit 1994    Past Surgical History  Procedure Laterality Date  . Coronary artery bypass graft  1994    By Dr. Cyndia Bent. This included an LIMA graft to the left circumflex coronary, a sequential saphenous vein graft to the diagonal and the LAD,  and a vein graft to the posterior descending and posterior lateral branches of the coronary artery.  . US echocardiography  09-07-09    Est EF 40-45%  . Cardioversion  06/03/2011    Procedure: CARDIOVERSION;  Surgeon: Deboraha Sprang, MD;  Location: [facility];  Service: Cardiovascular;  Laterality: N/A;  . Ep study and ablation  2013    by Dr Caryl Comes, repeat ablation by Dr Rayann Heman    History  Smoking status  . Former Smoker  . Quit date: 01/22/1992  Smokeless tobacco  . Not on file    History  Alcohol Use No    History reviewed. No pertinent family history.  Review of Systems: As noted in history of present illness.  All other systems were reviewed and are negative.  Physical Exam: BP 128/68  Pulse 74  Ht 5\' 10"  (1.778 m)  Wt 221 lb 12.8 oz (100.608 kg)  BMI 31.83 kg/m2 He is a pleasant white male in no acute distress. He is normocephalic, atraumatic. Pupils are equal  round and reactive to light accommodation. Extraocular movements are full. Oropharynx is clear. Neck is supple without JVD, adenopathy, or megaly, or bruits. Lungs are clear. Cardiac exam reveals a regular rate and rhythm without gallop, murmur, or click. Abdomen is obese, soft, nontender. He has good pedal pulses. He has no edema. He is alert and oriented x3. Cranial nerves II through XII are intact. LABORATORY DATA: Lab Results  Component Value Date   WBC 22.2* 01/28/2013   HGB 14.5 01/28/2013   HCT 45.2 01/28/2013   PLT 109* 01/28/2013   GLUCOSE 115 01/28/2013   ALT 43 01/28/2013   AST 30 01/28/2013   NA 141 01/28/2013   K 4.4 01/28/2013   CL 105 01/02/2012   CREATININE 0.9 01/28/2013   BUN 13.3 01/28/2013   CO2 24 01/28/2013   INR 1.5* 04/09/2011   Ecg today demonstrates atrial flutter with 4:1 AV conduction and rate 74 bpm. Nonspecific ST abnormality. QTc 490 msec.  Assessment / Plan: 1. Recurrent atrial flutter. Now status post radiofrequency ablation x2. Last was in August of 2013. Now with asymptomatic recurrence. Continue metoprolol for rate control. Will start anticoagulation with Xarelto 20 mg daily. Stop ASA. I think at this point further efforts at restoration of NSR are futile.  2. Coronary disease status post CABG in 1994. Last nuclear stress test in January 2013 showed an inferior lateral infarct without ischemia. Patient is asymptomatic. Continue  beta blocker therapy.  3. Chronic congestive heart failure. Ejection fraction of approximately 40%. No evidence of volume overload. Continue ARB and beta blocker therapy. I do not think he needs diuretic therapy at this point.   4. CLL stage 0.  5. Hypertension, controlled.

## 2013-02-18 NOTE — Patient Instructions (Signed)
Start Xarelto 20 mg daily  Stop ASA  Continue your other therapy

## 2013-02-26 DIAGNOSIS — I10 Essential (primary) hypertension: Secondary | ICD-10-CM | POA: Diagnosis not present

## 2013-02-26 DIAGNOSIS — D696 Thrombocytopenia, unspecified: Secondary | ICD-10-CM | POA: Diagnosis not present

## 2013-02-26 DIAGNOSIS — Z1331 Encounter for screening for depression: Secondary | ICD-10-CM | POA: Diagnosis not present

## 2013-02-26 DIAGNOSIS — E785 Hyperlipidemia, unspecified: Secondary | ICD-10-CM | POA: Diagnosis not present

## 2013-02-26 DIAGNOSIS — I4892 Unspecified atrial flutter: Secondary | ICD-10-CM | POA: Diagnosis not present

## 2013-02-26 DIAGNOSIS — G2589 Other specified extrapyramidal and movement disorders: Secondary | ICD-10-CM | POA: Diagnosis not present

## 2013-02-26 DIAGNOSIS — C9110 Chronic lymphocytic leukemia of B-cell type not having achieved remission: Secondary | ICD-10-CM | POA: Diagnosis not present

## 2013-02-26 DIAGNOSIS — I2510 Atherosclerotic heart disease of native coronary artery without angina pectoris: Secondary | ICD-10-CM | POA: Diagnosis not present

## 2013-03-25 DIAGNOSIS — H4011X0 Primary open-angle glaucoma, stage unspecified: Secondary | ICD-10-CM | POA: Diagnosis not present

## 2013-03-25 DIAGNOSIS — H409 Unspecified glaucoma: Secondary | ICD-10-CM | POA: Diagnosis not present

## 2013-03-25 DIAGNOSIS — H524 Presbyopia: Secondary | ICD-10-CM | POA: Diagnosis not present

## 2013-04-30 ENCOUNTER — Other Ambulatory Visit: Payer: Self-pay | Admitting: Cardiology

## 2013-05-04 DIAGNOSIS — H02849 Edema of unspecified eye, unspecified eyelid: Secondary | ICD-10-CM | POA: Diagnosis not present

## 2013-05-04 DIAGNOSIS — H00029 Hordeolum internum unspecified eye, unspecified eyelid: Secondary | ICD-10-CM | POA: Diagnosis not present

## 2013-05-18 DIAGNOSIS — H00029 Hordeolum internum unspecified eye, unspecified eyelid: Secondary | ICD-10-CM | POA: Diagnosis not present

## 2013-05-18 DIAGNOSIS — H02849 Edema of unspecified eye, unspecified eyelid: Secondary | ICD-10-CM | POA: Diagnosis not present

## 2013-05-20 DIAGNOSIS — H00029 Hordeolum internum unspecified eye, unspecified eyelid: Secondary | ICD-10-CM | POA: Diagnosis not present

## 2013-05-20 DIAGNOSIS — H00019 Hordeolum externum unspecified eye, unspecified eyelid: Secondary | ICD-10-CM | POA: Diagnosis not present

## 2013-05-20 DIAGNOSIS — H02849 Edema of unspecified eye, unspecified eyelid: Secondary | ICD-10-CM | POA: Diagnosis not present

## 2013-06-13 ENCOUNTER — Other Ambulatory Visit: Payer: Self-pay | Admitting: Cardiology

## 2013-07-15 ENCOUNTER — Telehealth: Payer: Self-pay | Admitting: Hematology and Oncology

## 2013-07-15 ENCOUNTER — Other Ambulatory Visit: Payer: Self-pay

## 2013-07-15 DIAGNOSIS — C9110 Chronic lymphocytic leukemia of B-cell type not having achieved remission: Secondary | ICD-10-CM

## 2013-07-15 MED ORDER — TRAMADOL HCL 50 MG PO TABS: 50.0000 mg | ORAL_TABLET | Freq: Four times a day (QID) | ORAL | Status: DC | PRN

## 2013-07-15 NOTE — Telephone Encounter (Signed)
Prescription for Tramadol faxed to [facility].  Sent to scan.   

## 2013-07-15 NOTE — Telephone Encounter (Signed)
, °

## 2013-07-20 ENCOUNTER — Telehealth: Payer: Self-pay | Admitting: *Deleted

## 2013-07-20 ENCOUNTER — Other Ambulatory Visit: Payer: Self-pay | Admitting: *Deleted

## 2013-07-20 DIAGNOSIS — C9110 Chronic lymphocytic leukemia of B-cell type not having achieved remission: Secondary | ICD-10-CM

## 2013-07-20 MED ORDER — TRAMADOL HCL 50 MG PO TABS: 50.0000 mg | ORAL_TABLET | Freq: Four times a day (QID) | ORAL | Status: DC | PRN

## 2013-07-20 NOTE — Telephone Encounter (Signed)
Patient called reporting he asked for tramadol refill and must uses mail order, [facility].  Noted on 07-15-2013 new order faxed to [facility].  Instructed to call [facility].

## 2013-07-20 NOTE — Telephone Encounter (Signed)
[facility] called [facility] who deny receipt of facsimile.  Re-ordered with patient identifiers on script to resend.

## 2013-07-29 ENCOUNTER — Other Ambulatory Visit: Payer: Medicare Other

## 2013-07-29 ENCOUNTER — Ambulatory Visit: Payer: Medicare Other | Admitting: Oncology

## 2013-07-30 ENCOUNTER — Other Ambulatory Visit: Payer: Self-pay | Admitting: Cardiology

## 2013-08-05 ENCOUNTER — Ambulatory Visit (HOSPITAL_BASED_OUTPATIENT_CLINIC_OR_DEPARTMENT_OTHER): Payer: Medicare Other | Admitting: Hematology

## 2013-08-05 ENCOUNTER — Other Ambulatory Visit (HOSPITAL_BASED_OUTPATIENT_CLINIC_OR_DEPARTMENT_OTHER): Payer: Medicare Other

## 2013-08-05 VITALS — BP 133/67 | HR 78 | Temp 98.0°F | Resp 20 | Ht 70.0 in | Wt 215.4 lb

## 2013-08-05 DIAGNOSIS — C9110 Chronic lymphocytic leukemia of B-cell type not having achieved remission: Secondary | ICD-10-CM | POA: Diagnosis not present

## 2013-08-05 DIAGNOSIS — D6959 Other secondary thrombocytopenia: Secondary | ICD-10-CM

## 2013-08-05 DIAGNOSIS — I4891 Unspecified atrial fibrillation: Secondary | ICD-10-CM | POA: Diagnosis not present

## 2013-08-05 LAB — CBC WITH DIFFERENTIAL/PLATELET
BASO%: 0.4 % (ref 0.0–2.0)
Basophils Absolute: 0.1 10*3/uL (ref 0.0–0.1)
EOS%: 0.6 % (ref 0.0–7.0)
Eosinophils Absolute: 0.1 10*3/uL (ref 0.0–0.5)
HEMATOCRIT: 47.4 % (ref 38.4–49.9)
HGB: 14.8 g/dL (ref 13.0–17.1)
LYMPH%: 75.1 % — ABNORMAL HIGH (ref 14.0–49.0)
MCH: 26.4 pg — ABNORMAL LOW (ref 27.2–33.4)
MCHC: 31.2 g/dL — ABNORMAL LOW (ref 32.0–36.0)
MCV: 84.7 fL (ref 79.3–98.0)
MONO#: 0.5 10*3/uL (ref 0.1–0.9)
MONO%: 2.3 % (ref 0.0–14.0)
NEUT%: 21.6 % — AB (ref 39.0–75.0)
NEUTROS ABS: 4.8 10*3/uL (ref 1.5–6.5)
Platelets: 111 10*3/uL — ABNORMAL LOW (ref 140–400)
RBC: 5.59 10*6/uL (ref 4.20–5.82)
RDW: 14.8 % — AB (ref 11.0–14.6)
WBC: 22.3 10*3/uL — AB (ref 4.0–10.3)
lymph#: 16.8 10*3/uL — ABNORMAL HIGH (ref 0.9–3.3)

## 2013-08-05 LAB — COMPREHENSIVE METABOLIC PANEL (CC13)
ALBUMIN: 4.2 g/dL (ref 3.5–5.0)
ALK PHOS: 46 U/L (ref 40–150)
ALT: 41 U/L (ref 0–55)
AST: 28 U/L (ref 5–34)
Anion Gap: 8 mEq/L (ref 3–11)
BUN: 17.2 mg/dL (ref 7.0–26.0)
CO2: 26 mEq/L (ref 22–29)
Calcium: 9.9 mg/dL (ref 8.4–10.4)
Chloride: 106 mEq/L (ref 98–109)
Creatinine: 0.9 mg/dL (ref 0.7–1.3)
GLUCOSE: 139 mg/dL (ref 70–140)
POTASSIUM: 4.1 meq/L (ref 3.5–5.1)
Sodium: 140 mEq/L (ref 136–145)
TOTAL PROTEIN: 6.8 g/dL (ref 6.4–8.3)
Total Bilirubin: 1.63 mg/dL — ABNORMAL HIGH (ref 0.20–1.20)

## 2013-08-05 LAB — TECHNOLOGIST REVIEW

## 2013-08-05 NOTE — Progress Notes (Signed)
OFFICE PROGRESS NOTE  CC  [name], MD [name] [id number]  DIAGNOSIS: is 69 year old gentleman with stage 0 CLL  PRIOR THERAPY:observation  CURRENT THERAPY:observation  INTERVAL HISTORY: Rodney Burns 68 y.o. male returns for followup visit. Overall he feels well.Otherwise he is feeling well he has not had any fevers chills night sweats any infections no recent pneumonias or bronchitis. He denies having any bleeding problems no hematuria hematochezia melena hemoptysis or hematemesis. Remainder of the 10 point review of systems is negative."I get tired and do things slowly". I take 1500 mg of Niaspan causes hot flush and I feel hot. "I am in Afib and going to see cardiologist on august 6 managed with medications" Cardiologist is [name]. White count is 22.3, hemoglobin 14.8, platelets are 111,000. I have galucoma take eye drops for it.  MEDICAL HISTORY: Past Medical History  Diagnosis Date  . Atrial tachycardia     s/p DCCV 09/2009, s/p RFCA 03/2011, s/p DCCV 04/2011, s/p RFCA 09/17/11  . Coronary artery disease     Status post CABG in 1994  . CLL (chronic lymphoblastic leukemia)     Stage 0-1  . Hypercholesterolemia     Well Controlled  . Hypertension   . Cough     Consistant with ACE inhibitor mediated cough  . Malaria 1972    Hx of  . Sleep-disordered breathing 06/20/2011  . History of tobacco abuse     quit 1994    ALLERGIES:  is allergic to penicillins; ace inhibitors; darvocet; and sulfa antibiotics.  MEDICATIONS:  Current Outpatient Prescriptions  Medication Sig Dispense Refill  . latanoprost (XALATAN) 0.005 % ophthalmic solution Place 1 drop into both eyes at bedtime.       . lidocaine (LIDODERM) 5 % Place 1 patch onto the skin daily as needed. Remove & Discard patch within 12 hours or as directed by MD, for left shoulder pain      . losartan (COZAAR) 50 MG tablet TAKE 1 TABLET DAILY (NEED APPOINTMENT IN JANUARY)  90 tablet  1  .  Magnesium Hydroxide (MAGNESIA PO) Take by mouth.      . metoprolol succinate (TOPROL-XL) 25 MG 24 hr tablet Take 1 tablet (25 mg total) by mouth daily.  90 tablet  3  . NIASPAN 750 MG CR tablet TAKE 2 TABLETS (1500 MG TOTAL) AT BEDTIME  180 tablet  0  . pramipexole (MIRAPEX) 0.125 MG tablet Take 0.125 mg by mouth at bedtime. For legs      . RABEprazole (ACIPHEX) 20 MG tablet Take 1 tablet (20 mg total) by mouth daily.  90 tablet  3  . Rivaroxaban (XARELTO) 20 MG TABS tablet Take 1 tablet (20 mg total) by mouth daily with supper.  90 tablet  3  . traMADol (ULTRAM) 50 MG tablet Take 1 tablet (50 mg total) by mouth every 6 (six) hours as needed.  90 tablet  5  . VYTORIN 10-20 MG per tablet TAKE 1 TABLET AT BEDTIME  90 tablet  0   No current facility-administered medications for this visit.    SURGICAL HISTORY:  Past Surgical History  Procedure Laterality Date  . Coronary artery bypass graft  1994    By Dr. Cyndia Bent. This included an LIMA graft to the left circumflex coronary, a sequential saphenous vein graft to the diagonal and the LAD, and a vein graft to the posterior descending and posterior lateral branches of the coronary artery.  . US echocardiography  09-07-09  Est EF 40-45%  . Cardioversion  06/03/2011    Procedure: CARDIOVERSION;  Surgeon: Deboraha Sprang, MD;  Location: [facility];  Service: Cardiovascular;  Laterality: N/A;  . Ep study and ablation  2013    by Dr Caryl Comes, repeat ablation by Dr Rayann Heman    REVIEW OF SYSTEMS:   NO FATIGUE, NIGHTSWEATS, INFECTIONS, CLOTS, ABDOMINAL PAIN, EDEMA  PHYSICAL EXAMINATION: Well-developed well-nourished male in no acute distress HEENT exam EOMI PERRLA sclerae anicteric no conjunctival pallor oral mucosa is moist neck is supple lungs clear cardiovascular regular rate rhythm no murmurs abdomen is soft nontender no HSM extremities no edema neuro patient's alert oriented otherwise nonfocal, lymph node survey negative for cervical supraclavicular or axillary  adenopathy  ECOG PERFORMANCE STATUS: 0 - Asymptomatic  Blood pressure 133/67, pulse 78, temperature 98 F (36.7 C), temperature source Oral, resp. rate 20, height 5\' 10"  (1.778 m), weight 215 lb 6.4 oz (97.705 kg).  LABORATORY DATA: Lab Results  Component Value Date   WBC 22.3* 08/05/2013   HGB 14.8 08/05/2013   HCT 47.4 08/05/2013   MCV 84.7 08/05/2013   PLT 111* 08/05/2013      Chemistry      Component Value Date/Time   NA 141 01/28/2013 1035   NA 140 09/12/2011 0824   K 4.4 01/28/2013 1035   K 4.6 09/12/2011 0824   CL 105 01/02/2012 0938   CL 106 09/12/2011 0824   CO2 24 01/28/2013 1035   CO2 28 09/12/2011 0824   BUN 13.3 01/28/2013 1035   BUN 11 09/12/2011 0824   CREATININE 0.9 01/28/2013 1035   CREATININE 1.05 09/12/2011 0824      Component Value Date/Time   CALCIUM 9.3 01/28/2013 1035   CALCIUM 9.0 09/12/2011 0824   ALKPHOS 43 01/28/2013 1035   ALKPHOS 41 09/12/2011 0824   AST 30 01/28/2013 1035   AST 23 09/12/2011 0824   ALT 43 01/28/2013 1035   ALT 30 09/12/2011 0824   BILITOT 0.99 01/28/2013 1035   BILITOT 0.9 09/12/2011 0824        ASSESSMENT: 68 year old gentleman with  #1 CLL stage 0 essentially remaining stable.  #2 atrial fibrillation.  #3 thrombocytopenia secondary to #1We will continue to monitor this.  PLAN:   #1 patient will continue to be observed for his CLL no acute intervention is needed.  #2 I will plan on seeing the patient back in 6 months   All questions were answered. The patient knows to call the clinic with any problems, questions or concerns. We can certainly see the patient much sooner if necessary.  I spent 30 minutes counseling the patient face to face. The total time spent in the appointment was 30 minutes.    [name], MD Medical Hematologist/Oncologist [facility] Pager: [phone] Office No: [phone]  08/05/2013, 9:17 AM

## 2013-08-06 LAB — IGG, IGA, IGM
IgA: 110 mg/dL (ref 68–379)
IgG (Immunoglobin G), Serum: 659 mg/dL (ref 650–1600)
IgM, Serum: 30 mg/dL — ABNORMAL LOW (ref 41–251)

## 2013-08-06 LAB — DIRECT ANTIGLOBULIN TEST (NOT AT ARMC)
DAT (COMPLEMENT): NEGATIVE
DAT IGG: NEGATIVE

## 2013-08-06 LAB — HAPTOGLOBIN: HAPTOGLOBIN: 87 mg/dL (ref 45–215)

## 2013-08-08 ENCOUNTER — Telehealth: Payer: Self-pay | Admitting: Hematology

## 2013-08-08 NOTE — Telephone Encounter (Signed)
lvm for pt regarding to Jan 2016 appt....mailed pt appt sched adn avs to pt

## 2013-08-26 ENCOUNTER — Ambulatory Visit (INDEPENDENT_AMBULATORY_CARE_PROVIDER_SITE_OTHER): Payer: Medicare Other | Admitting: Cardiology

## 2013-08-26 ENCOUNTER — Encounter: Payer: Self-pay | Admitting: Cardiology

## 2013-08-26 VITALS — BP 109/69 | HR 74 | Ht 71.0 in | Wt 216.3 lb

## 2013-08-26 DIAGNOSIS — I5022 Chronic systolic (congestive) heart failure: Secondary | ICD-10-CM

## 2013-08-26 DIAGNOSIS — I484 Atypical atrial flutter: Secondary | ICD-10-CM

## 2013-08-26 DIAGNOSIS — E7800 Pure hypercholesterolemia, unspecified: Secondary | ICD-10-CM

## 2013-08-26 DIAGNOSIS — I2510 Atherosclerotic heart disease of native coronary artery without angina pectoris: Secondary | ICD-10-CM | POA: Diagnosis not present

## 2013-08-26 DIAGNOSIS — I509 Heart failure, unspecified: Secondary | ICD-10-CM

## 2013-08-26 DIAGNOSIS — I4892 Unspecified atrial flutter: Secondary | ICD-10-CM | POA: Diagnosis not present

## 2013-08-26 NOTE — Progress Notes (Signed)
Rodney Burns Date of Birth: 1945/08/11   History of Present Illness: Rodney Burns is seen for followup today. He has a history of atrial flutter and has had multiple cardioversions. He had radiofrequency ablation in March and then again in August of 2013. He had recurrent atrial flutter and has been treated with rate control and anticoagulation.  He has a history of coronary disease and is status post CABG in 1994. He had a nuclear stress test in January 2013 which showed an inferior lateral infarct without evidence of ischemia. Ejection fraction was 39%. By echocardiogram his ejection fraction was 40-45%. He also has a history of CLL. On followup today he reports he is doing well.  He denies any dyspnea, chest pain, or palpitations. His energy level is good.  Current Outpatient Prescriptions on File Prior to Visit  Medication Sig Dispense Refill  . latanoprost (XALATAN) 0.005 % ophthalmic solution Place 1 drop into both eyes at bedtime.       . lidocaine (LIDODERM) 5 % Place 1 patch onto the skin daily as needed. Remove & Discard patch within 12 hours or as directed by MD, for left shoulder pain      . losartan (COZAAR) 50 MG tablet TAKE 1 TABLET DAILY (NEED APPOINTMENT IN JANUARY)  90 tablet  1  . Magnesium Hydroxide (MAGNESIA PO) Take by mouth.      . metoprolol succinate (TOPROL-XL) 25 MG 24 hr tablet Take 1 tablet (25 mg total) by mouth daily.  90 tablet  3  . NIASPAN 750 MG CR tablet TAKE 2 TABLETS (1500 MG TOTAL) AT BEDTIME  180 tablet  0  . pramipexole (MIRAPEX) 0.125 MG tablet Take 0.125 mg by mouth at bedtime. For legs      . RABEprazole (ACIPHEX) 20 MG tablet Take 1 tablet (20 mg total) by mouth daily.  90 tablet  3  . Rivaroxaban (XARELTO) 20 MG TABS tablet Take 1 tablet (20 mg total) by mouth daily with supper.  90 tablet  3  . traMADol (ULTRAM) 50 MG tablet Take 1 tablet (50 mg total) by mouth every 6 (six) hours as needed.  90 tablet  5  . VYTORIN 10-20 MG per tablet TAKE 1 TABLET AT  BEDTIME  90 tablet  0   No current facility-administered medications on file prior to visit.    Allergies  Allergen Reactions  . Penicillins Other (See Comments)    Put pt in hospital 50 yrs ago  . Ace Inhibitors     Cause a Cough  . Darvocet [Propoxyphene N-Acetaminophen] Rash    Tolerates tylenol  . Sulfa Antibiotics Rash    Past Medical History  Diagnosis Date  . Atrial tachycardia     s/p DCCV 09/2009, s/p RFCA 03/2011, s/p DCCV 04/2011, s/p RFCA 09/17/11  . Coronary artery disease     Status post CABG in 1994  . CLL (chronic lymphoblastic leukemia)     Stage 0-1  . Hypercholesterolemia     Well Controlled  . Hypertension   . Cough     Consistant with ACE inhibitor mediated cough  . Malaria 1972    Hx of  . Sleep-disordered breathing 06/20/2011  . History of tobacco abuse     quit 1994    Past Surgical History  Procedure Laterality Date  . Coronary artery bypass graft  1994    By Dr. Cyndia Bent. This included an LIMA graft to the left circumflex coronary, a sequential saphenous vein graft to the diagonal  and the LAD, and a vein graft to the posterior descending and posterior lateral branches of the coronary artery.  . US echocardiography  09-07-09    Est EF 40-45%  . Cardioversion  06/03/2011    Procedure: CARDIOVERSION;  Surgeon: Deboraha Sprang, MD;  Location: [facility];  Service: Cardiovascular;  Laterality: N/A;  . Ep study and ablation  2013    by Dr Caryl Comes, repeat ablation by Dr Rayann Heman    History  Smoking status  . Former Smoker  . Quit date: 01/22/1992  Smokeless tobacco  . Not on file    History  Alcohol Use No    History reviewed. No pertinent family history.  Review of Systems: As noted in history of present illness.  All other systems were reviewed and are negative.  Physical Exam: BP 109/69  Pulse 74  Ht 5\' 11"  (1.803 m)  Wt 216 lb 4.8 oz (98.113 kg)  BMI 30.18 kg/m2 He is a pleasant white male in no acute distress. He is normocephalic,  atraumatic. Pupils are equal round and reactive to light accommodation. Extraocular movements are full. Oropharynx is clear. Neck is supple without JVD, adenopathy, or megaly, or bruits. Lungs are clear. Cardiac exam reveals a regular rate and rhythm without gallop, murmur, or click. Abdomen is obese, soft, nontender. He has good pedal pulses. He has no edema. He is alert and oriented x3. Cranial nerves II through XII are intact.  LABORATORY DATA: Lab Results  Component Value Date   WBC 22.3* 08/05/2013   HGB 14.8 08/05/2013   HCT 47.4 08/05/2013   PLT 111* 08/05/2013   GLUCOSE 139 08/05/2013   ALT 41 08/05/2013   AST 28 08/05/2013   NA 140 08/05/2013   K 4.1 08/05/2013   CL 105 01/02/2012   CREATININE 0.9 08/05/2013   BUN 17.2 08/05/2013   CO2 26 08/05/2013   INR 1.5* 04/09/2011   Ecg today demonstrates atrial flutter with 3:1 AV conduction and rate 75 bpm. Possible old anterior infarct.   Assessment / Plan: 1. Recurrent atrial flutter. Now status post radiofrequency ablation x2. Last was in August of 2013. Now with asymptomatic recurrence. Continue metoprolol for rate control. Continue anticoagulation with Xarelto 20 mg daily.  I think at this point further efforts at restoration of NSR are futile.  2. Coronary disease status post CABG in 1994. Last nuclear stress test in January 2013 showed an inferior lateral infarct without ischemia. Patient is asymptomatic. Continue  beta blocker therapy.  3. Chronic congestive heart failure. Ejection fraction of approximately 40%. No evidence of volume overload. Continue ARB and beta blocker therapy. I do not think he needs diuretic therapy at this point.   4. CLL stage 0. Followed by oncology.  5. Hypertension, controlled.   6. Thrombocytopenia.

## 2013-08-26 NOTE — Patient Instructions (Signed)
Continue your current therapy  I will see you in 6 months.   

## 2013-08-29 ENCOUNTER — Other Ambulatory Visit: Payer: Self-pay | Admitting: Cardiology

## 2013-09-21 ENCOUNTER — Other Ambulatory Visit: Payer: Self-pay | Admitting: Cardiology

## 2013-10-12 DIAGNOSIS — H4011X0 Primary open-angle glaucoma, stage unspecified: Secondary | ICD-10-CM | POA: Diagnosis not present

## 2013-10-12 DIAGNOSIS — H409 Unspecified glaucoma: Secondary | ICD-10-CM | POA: Diagnosis not present

## 2013-10-15 ENCOUNTER — Other Ambulatory Visit: Payer: Self-pay | Admitting: Cardiology

## 2013-10-27 ENCOUNTER — Other Ambulatory Visit: Payer: Self-pay | Admitting: Cardiology

## 2013-10-27 DIAGNOSIS — Z23 Encounter for immunization: Secondary | ICD-10-CM | POA: Diagnosis not present

## 2013-11-02 IMAGING — CR DG CHEST 2V
2 series · 2 of 2 positions shown · non-contrast
Comparison: 10/05/2009.

CLINICAL DATA: Pre procedure.

CHEST - 2 VIEW

[view not recorded (1 of 2)]
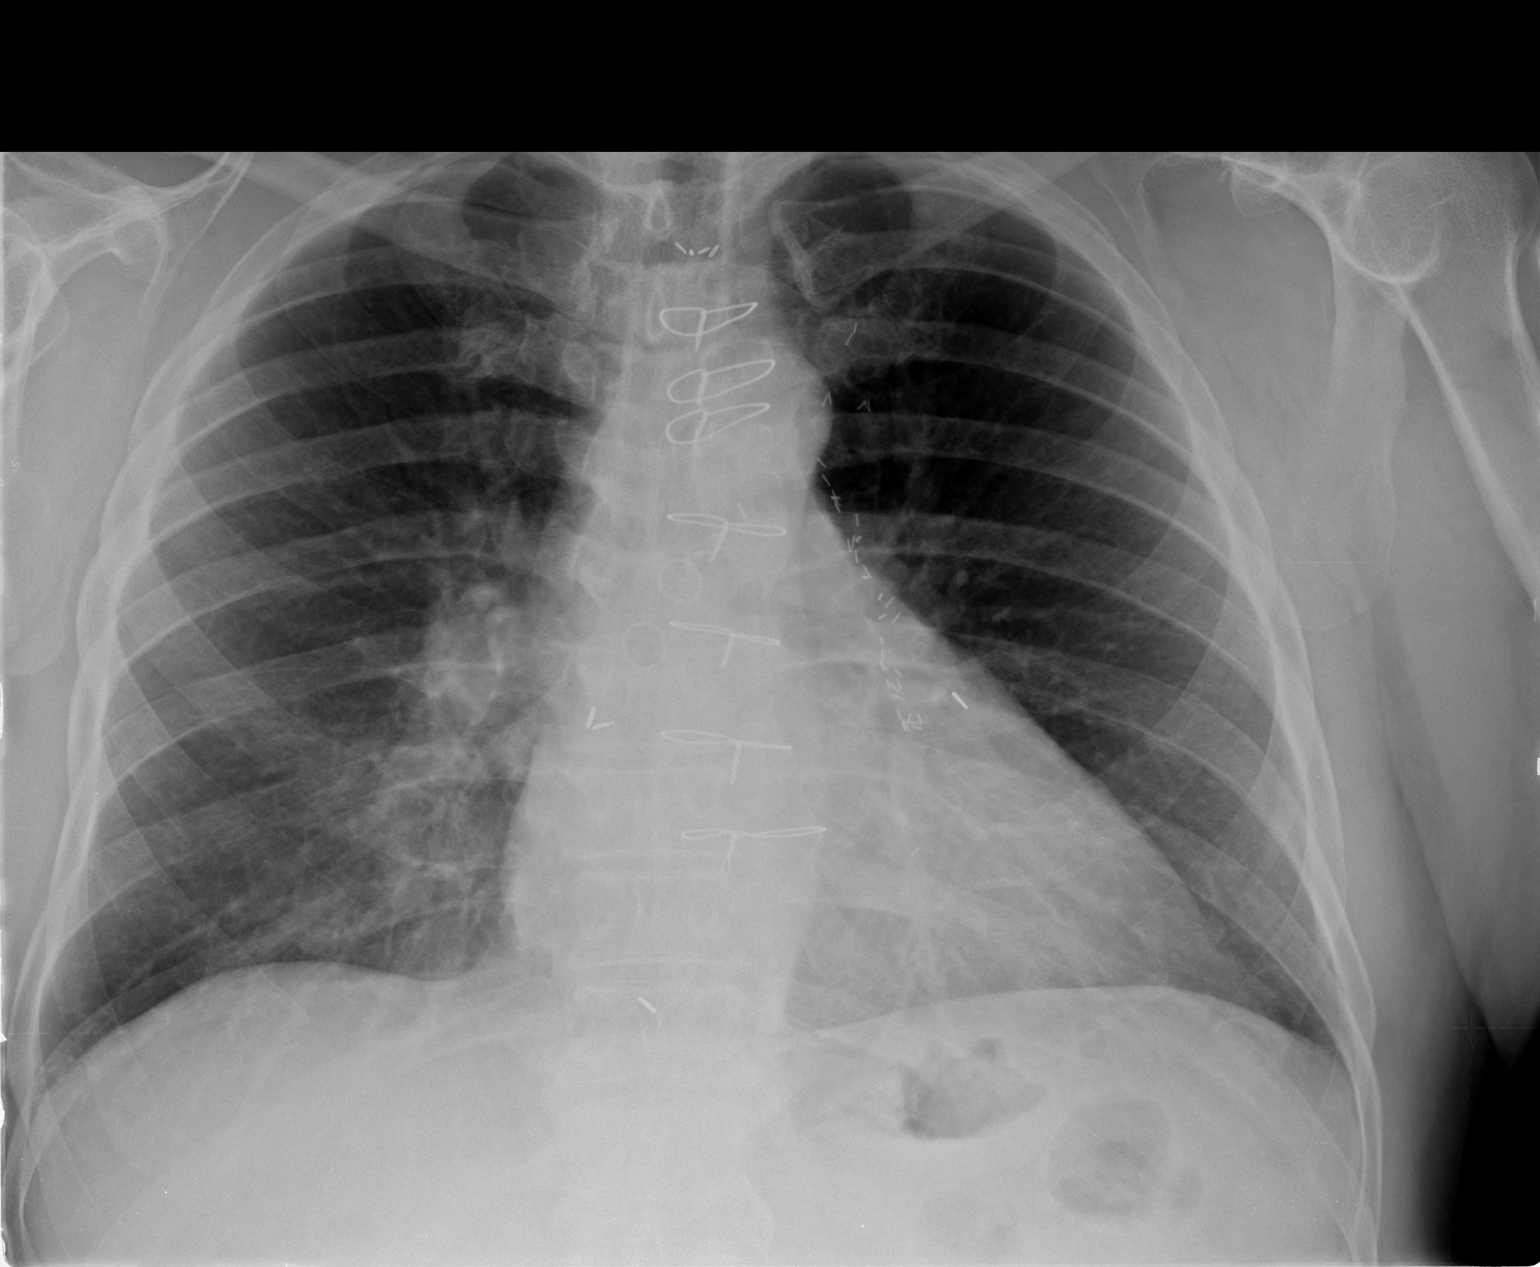

[view not recorded (2 of 2)]
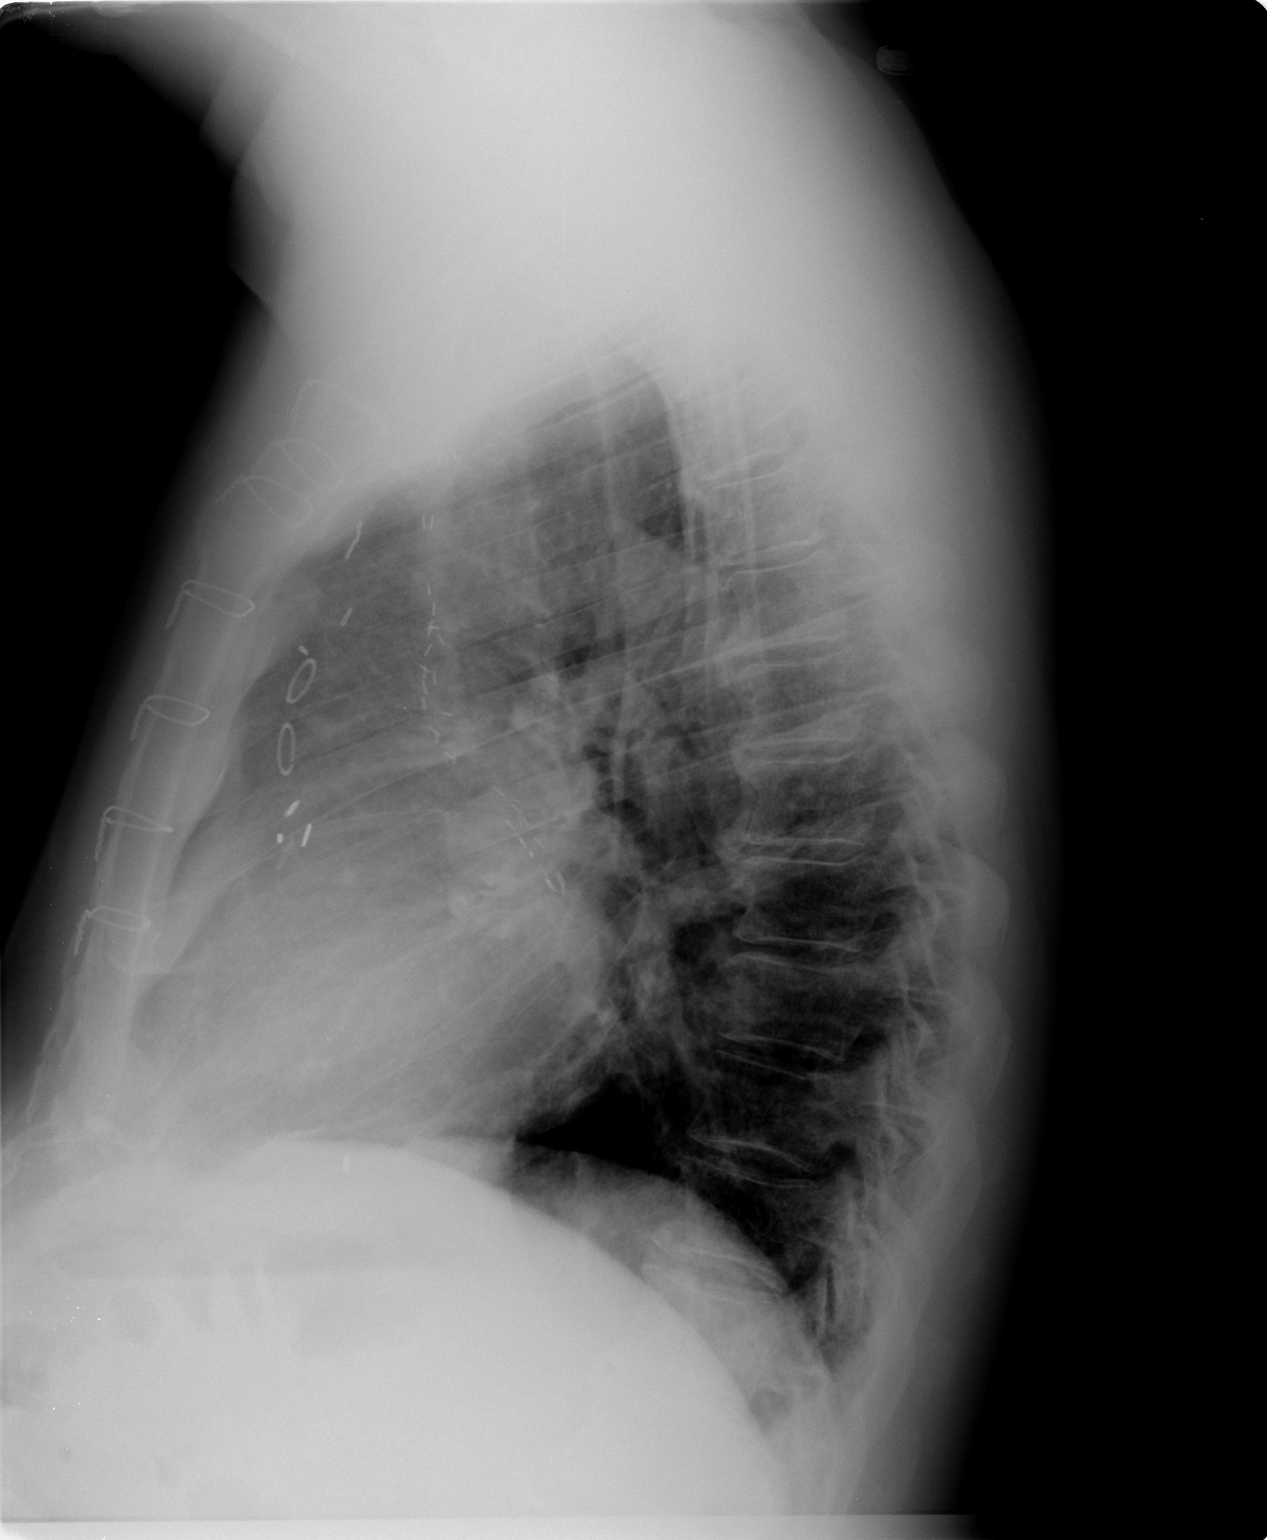

[2 of 2 positions shown; findings below may reference images not displayed]

FINDINGS: Trachea is midline.  Heart size stable.  Mild right hilar
prominence appears unchanged.  Lungs are clear.  No pleural fluid.
IMPRESSION: No acute findings.

## 2013-12-02 ENCOUNTER — Other Ambulatory Visit: Payer: Self-pay | Admitting: Cardiology

## 2013-12-30 ENCOUNTER — Encounter (HOSPITAL_COMMUNITY): Payer: Self-pay | Admitting: Internal Medicine

## 2014-01-06 ENCOUNTER — Other Ambulatory Visit: Payer: Self-pay | Admitting: Cardiology

## 2014-01-28 ENCOUNTER — Other Ambulatory Visit (HOSPITAL_BASED_OUTPATIENT_CLINIC_OR_DEPARTMENT_OTHER): Payer: Medicare Other

## 2014-01-28 DIAGNOSIS — C9110 Chronic lymphocytic leukemia of B-cell type not having achieved remission: Secondary | ICD-10-CM

## 2014-01-28 LAB — CBC WITH DIFFERENTIAL/PLATELET
BASO%: 0.1 % (ref 0.0–2.0)
BASOS ABS: 0 10*3/uL (ref 0.0–0.1)
EOS%: 0.7 % (ref 0.0–7.0)
Eosinophils Absolute: 0.2 10*3/uL (ref 0.0–0.5)
HCT: 43.7 % (ref 38.4–49.9)
HEMOGLOBIN: 13.7 g/dL (ref 13.0–17.1)
LYMPH#: 16.3 10*3/uL — AB (ref 0.9–3.3)
LYMPH%: 74.7 % — ABNORMAL HIGH (ref 14.0–49.0)
MCH: 26.9 pg — AB (ref 27.2–33.4)
MCHC: 31.4 g/dL — ABNORMAL LOW (ref 32.0–36.0)
MCV: 85.9 fL (ref 79.3–98.0)
MONO#: 0.9 10*3/uL (ref 0.1–0.9)
MONO%: 4.3 % (ref 0.0–14.0)
NEUT#: 4.4 10*3/uL (ref 1.5–6.5)
NEUT%: 20.2 % — AB (ref 39.0–75.0)
Platelets: 129 10*3/uL — ABNORMAL LOW (ref 140–400)
RBC: 5.09 10*6/uL (ref 4.20–5.82)
RDW: 13.7 % (ref 11.0–14.6)
WBC: 21.8 10*3/uL — ABNORMAL HIGH (ref 4.0–10.3)

## 2014-01-28 LAB — TECHNOLOGIST REVIEW

## 2014-02-04 ENCOUNTER — Telehealth: Payer: Self-pay

## 2014-02-04 ENCOUNTER — Ambulatory Visit (HOSPITAL_BASED_OUTPATIENT_CLINIC_OR_DEPARTMENT_OTHER): Payer: Medicare Other | Admitting: Hematology and Oncology

## 2014-02-04 VITALS — BP 106/68 | HR 78 | Temp 98.1°F | Resp 18 | Ht 71.0 in | Wt 218.2 lb

## 2014-02-04 DIAGNOSIS — D696 Thrombocytopenia, unspecified: Secondary | ICD-10-CM

## 2014-02-04 DIAGNOSIS — J209 Acute bronchitis, unspecified: Secondary | ICD-10-CM | POA: Diagnosis not present

## 2014-02-04 DIAGNOSIS — C9110 Chronic lymphocytic leukemia of B-cell type not having achieved remission: Secondary | ICD-10-CM

## 2014-02-04 MED ORDER — TRAMADOL HCL 50 MG PO TABS: 50.0000 mg | ORAL_TABLET | Freq: Four times a day (QID) | ORAL | Status: DC | PRN

## 2014-02-04 MED ORDER — AZITHROMYCIN 250 MG PO TABS: ORAL_TABLET | ORAL | Status: DC

## 2014-02-04 NOTE — Telephone Encounter (Signed)
Prescription for Tramadol faxed to [facility].  Sent to scan.

## 2014-02-04 NOTE — Progress Notes (Signed)
Patient Care Team: Hulan Fess, MD as PCP - General (Family Medicine)  DIAGNOSIS: CLL stage I (lymphadenopathy and lymphocytosis) Current treatment: Observation CHIEF COMPLIANT: Bronchitis for 4 weeks  INTERVAL HISTORY: Rodney Burns is a 69 year old gentleman with the above-mentioned history of CLL diagnosed in 2011 when he had chronic lymphocytosis. Flow cytometry in the peripheral blood was positive for CD5 and A light general restriction diagnostic for CLL. Over the past several years he has been under observation without requiring any treatment. Initially he had CT of his chest abdomen and pelvis which revealed mild splenomegaly and enlarged lymph nodes that were fairly small in size. He has been on observation ever since without requiring any treatment. He denies any fevers chills night sweats or weight loss. He denies any rapidly enlarging lymphadenopathy. He complains today of a bronchitis that started 4 weeks ago. Over-the-counter medications seem to have improved slightly his symptoms but overall he continues to have cough with productive sputum. He denies any fevers or chills.  REVIEW OF SYSTEMS:   Constitutional: Denies fevers, chills or abnormal weight loss Eyes: Denies blurriness of vision Ears, nose, mouth, throat, and face: Denies mucositis or sore throat Respiratory: Cough with productive sputum Cardiovascular: Denies palpitation, chest discomfort or lower extremity swelling Gastrointestinal:  Denies nausea, heartburn or change in bowel habits Skin: Denies abnormal skin rashes Lymphatics: Denies new lymphadenopathy or easy bruising Neurological:Denies numbness, tingling or new weaknesses Behavioral/Psych: Mood is stable, no new changes  All other systems were reviewed with the patient and are negative.  I have reviewed the past medical history, past surgical history, social history and family history with the patient and they are unchanged from previous note.  ALLERGIES:  is  allergic to penicillins; ace inhibitors; darvocet; and sulfa antibiotics.  MEDICATIONS:  Current Outpatient Prescriptions  Medication Sig Dispense Refill  . latanoprost (XALATAN) 0.005 % ophthalmic solution Place 1 drop into both eyes at bedtime.     . lidocaine (LIDODERM) 5 % Place 1 patch onto the skin daily as needed. Remove & Discard patch within 12 hours or as directed by MD, for left shoulder pain    . losartan (COZAAR) 50 MG tablet TAKE 1 TABLET DAILY 90 tablet 1  . Magnesium Hydroxide (MAGNESIA PO) Take by mouth.    . metoprolol succinate (TOPROL-XL) 25 MG 24 hr tablet TAKE 1 TABLET DAILY 90 tablet 2  . NIASPAN 750 MG CR tablet TAKE 2 TABLETS (1500 MG TOTAL) AT BEDTIME 180 tablet 1  . pramipexole (MIRAPEX) 0.125 MG tablet Take 0.125 mg by mouth at bedtime. For legs    . RABEprazole (ACIPHEX) 20 MG tablet Take 1 tablet (20 mg total) by mouth daily. 90 tablet 3  . traMADol (ULTRAM) 50 MG tablet Take 1 tablet (50 mg total) by mouth every 6 (six) hours as needed. 30 tablet 2  . VYTORIN 10-20 MG per tablet TAKE 1 TABLET AT BEDTIME 90 tablet 3  . XARELTO 20 MG TABS tablet TAKE 1 TABLET DAILY WITH SUPPER 90 tablet 1  . azithromycin (ZITHROMAX Z-PAK) 250 MG tablet Take 2 tabs 1st day and 1 tab daily for 5 days 6 each 0   No current facility-administered medications for this visit.    PHYSICAL EXAMINATION: ECOG PERFORMANCE STATUS: 1 - Symptomatic but completely ambulatory  Filed Vitals:   02/04/14 1202  BP: 106/68  Pulse: 78  Temp: 98.1 F (36.7 C)  Resp: 18   Filed Weights   02/04/14 1202  Weight: 218 lb 3.2 oz (  98.975 kg)    GENERAL:alert, no distress and comfortable SKIN: skin color, texture, turgor are normal, no rashes or significant lesions EYES: normal, Conjunctiva are pink and non-injected, sclera clear OROPHARYNX:no exudate, no erythema and lips, buccal mucosa, and tongue normal  NECK: supple, thyroid normal size, non-tender, without nodularity LYMPH:  no palpable  lymphadenopathy in the cervical, axillary or inguinal LUNGS: clear to auscultation and percussion with normal breathing effort HEART: regular rate & rhythm and no murmurs and no lower extremity edema ABDOMEN:abdomen soft, non-tender and normal bowel sounds Musculoskeletal:no cyanosis of digits and no clubbing  NEURO: alert & oriented x 3 with fluent speech, no focal motor/sensory deficits  LABORATORY DATA:  I have reviewed the data as listed   Chemistry      Component Value Date/Time   NA 140 08/05/2013 0832   NA 140 09/12/2011 0824   K 4.1 08/05/2013 0832   K 4.6 09/12/2011 0824   CL 105 01/02/2012 0938   CL 106 09/12/2011 0824   CO2 26 08/05/2013 0832   CO2 28 09/12/2011 0824   BUN 17.2 08/05/2013 0832   BUN 11 09/12/2011 0824   CREATININE 0.9 08/05/2013 0832   CREATININE 1.05 09/12/2011 0824      Component Value Date/Time   CALCIUM 9.9 08/05/2013 0832   CALCIUM 9.0 09/12/2011 0824   ALKPHOS 46 08/05/2013 0832   ALKPHOS 41 09/12/2011 0824   AST 28 08/05/2013 0832   AST 23 09/12/2011 0824   ALT 41 08/05/2013 0832   ALT 30 09/12/2011 0824   BILITOT 1.63* 08/05/2013 0832   BILITOT 0.9 09/12/2011 0824       Lab Results  Component Value Date   WBC 21.8* 01/28/2014   HGB 13.7 01/28/2014   HCT 43.7 01/28/2014   MCV 85.9 01/28/2014   PLT 129* 01/28/2014   NEUTROABS 4.4 01/28/2014   ASSESSMENT & PLAN:  CLL (chronic lymphocytic leukemia) CLL stage I, lymphocytosis and lymphadenopathy diagnosed 07/31/2009, initial CT scan revealed lymphadenopathy and mildly enlarged spleen.  Treatment plan: Observation. I discussed with him the indications for treatment would be rapid doubling time on development of severe anemia thrombocytopenia, rapidly enlarging lymphadenopathy, B symptoms like fevers chills night sweats and weight loss.  Chronic thrombocytopenia: I suspect this may be related to mild immune thrombocytopenia. I do not attribute this to CLL. Since his platelet counts  are stable, there is no indication to treat.  Acute Bronchitis: Not responding to over-the-counter medications for the past 4 weeks. I will prescribe him azithromycin. I renewed his prescription for tramadol. Return to clinic in 6 months for follow-up with blood tests.     Orders Placed This Encounter  Procedures  . CBC with Differential    Standing Status: Future     Number of Occurrences:      Standing Expiration Date: 02/04/2015  . Comprehensive metabolic panel (Cmet) - [facility]    Standing Status: Future     Number of Occurrences:      Standing Expiration Date: 02/04/2015  . Lactate dehydrogenase (LDH) - [facility]    Standing Status: Future     Number of Occurrences:      Standing Expiration Date: 02/04/2015   The patient has a good understanding of the overall plan. he agrees with it. She will call with any problems that may develop before her next visit here.   Rulon Eisenmenger, MD

## 2014-02-04 NOTE — Assessment & Plan Note (Signed)
CLL stage I, lymphocytosis and lymphadenopathy diagnosed 07/31/2009, initial CT scan revealed lymphadenopathy and mildly enlarged spleen.  Treatment plan: Observation. I discussed with him the indications for treatment would be rapid doubling time on development of severe anemia thrombocytopenia, rapidly enlarging lymphadenopathy, B symptoms like fevers chills night sweats and weight loss.  Chronic thrombocytopenia: I suspect this may be related to mild immune thrombocytopenia. I do not attribute this to CLL. Since his platelet counts are stable, there is no indication to treat.  Acute Bronchitis: Not responding to over-the-counter medications for the past 4 weeks. I will prescribe him azithromycin. I renewed his prescription for tramadol. Return to clinic in 6 months for follow-up with blood tests.

## 2014-02-11 ENCOUNTER — Other Ambulatory Visit: Payer: Self-pay | Admitting: Cardiology

## 2014-02-13 NOTE — Telephone Encounter (Signed)
Rx(s) sent to pharmacy electronically.  

## 2014-03-03 ENCOUNTER — Other Ambulatory Visit: Payer: Self-pay | Admitting: *Deleted

## 2014-03-03 ENCOUNTER — Encounter: Payer: Self-pay | Admitting: *Deleted

## 2014-03-03 DIAGNOSIS — C9110 Chronic lymphocytic leukemia of B-cell type not having achieved remission: Secondary | ICD-10-CM

## 2014-03-03 MED ORDER — TRAMADOL HCL 50 MG PO TABS: 50.0000 mg | ORAL_TABLET | Freq: Four times a day (QID) | ORAL | Status: DC | PRN

## 2014-03-03 NOTE — Progress Notes (Signed)
fax'd Tramadol rx to [facility].

## 2014-03-10 ENCOUNTER — Encounter: Payer: Self-pay | Admitting: Cardiology

## 2014-03-10 ENCOUNTER — Ambulatory Visit (INDEPENDENT_AMBULATORY_CARE_PROVIDER_SITE_OTHER): Payer: Medicare Other | Admitting: Cardiology

## 2014-03-10 VITALS — BP 110/70 | HR 76 | Ht 70.0 in | Wt 221.6 lb

## 2014-03-10 DIAGNOSIS — I484 Atypical atrial flutter: Secondary | ICD-10-CM | POA: Diagnosis not present

## 2014-03-10 DIAGNOSIS — I5022 Chronic systolic (congestive) heart failure: Secondary | ICD-10-CM | POA: Diagnosis not present

## 2014-03-10 DIAGNOSIS — I10 Essential (primary) hypertension: Secondary | ICD-10-CM

## 2014-03-10 DIAGNOSIS — I25810 Atherosclerosis of coronary artery bypass graft(s) without angina pectoris: Secondary | ICD-10-CM

## 2014-03-10 MED ORDER — RIVAROXABAN 20 MG PO TABS: 20.0000 mg | ORAL_TABLET | Freq: Every day | ORAL | Status: DC

## 2014-03-10 NOTE — Patient Instructions (Signed)
We will schedule you for a Nuclear stress test  I will request a copy of your lab work from Dr. Rex Kras.  I will see you in 6 months.

## 2014-03-10 NOTE — Progress Notes (Signed)
Rodney Burns Date of Birth: [birth year]   History of Present Illness: Rodney Burns is seen for followup of atrial flutter and CAD. He has a history of atrial flutter and has had multiple cardioversions. He had radiofrequency ablation in March and then again in August of 2013. He had recurrent atrial flutter and has been treated with rate control and anticoagulation.  He has a history of coronary disease and is status post CABG in 1994. He had a nuclear stress test in January 2013 which showed an inferior lateral infarct without evidence of ischemia. Ejection fraction was 39%. By echocardiogram his ejection fraction was 40-45%. He also has a history of CLL. On followup today he reports he is doing well. He had bronchitis in December. He has a mild hemorrhoid flair. He has some trouble sleeping.  He denies any dyspnea, chest pain, or palpitations. His energy level is good.  Current Outpatient Prescriptions on File Prior to Visit  Medication Sig Dispense Refill  . latanoprost (XALATAN) 0.005 % ophthalmic solution Place 1 drop into both eyes at bedtime.     [name] losartan (COZAAR) 50 MG tablet TAKE 1 TABLET DAILY 90 tablet 1  . Magnesium Hydroxide (MAGNESIA PO) Take by mouth.    . metoprolol succinate (TOPROL-XL) 25 MG 24 hr tablet TAKE 1 TABLET DAILY 90 tablet 2  . niacin (NIASPAN) 750 MG CR tablet Take 2 tablets (1,500 mg total) by mouth at bedtime. 180 tablet 2  . pramipexole (MIRAPEX) 0.125 MG tablet Take 0.125 mg by mouth at bedtime. For legs    . RABEprazole (ACIPHEX) 20 MG tablet Take 1 tablet (20 mg total) by mouth daily. 90 tablet 3  . traMADol (ULTRAM) 50 MG tablet Take 1 tablet (50 mg total) by mouth every 6 (six) hours as needed. 30 tablet 2  . VYTORIN 10-20 MG per tablet TAKE 1 TABLET AT BEDTIME 90 tablet 3  . azithromycin (ZITHROMAX Z-PAK) 250 MG tablet Take 2 tabs 1st day and 1 tab daily for 5 days (Patient not taking: Reported on 03/10/2014) 6 each 0  . lidocaine (LIDODERM) 5 % Place 1 patch onto  the skin daily as needed. Remove & Discard patch within 12 hours or as directed by MD, for left shoulder pain     No current facility-administered medications on file prior to visit.    Allergies  Allergen Reactions  . Penicillins Other (See Comments)    Put pt in hospital 50 yrs ago  . Ace Inhibitors     Cause a Cough  . Darvocet [Propoxyphene N-Acetaminophen] Rash    Tolerates tylenol  . Sulfa Antibiotics Rash    Past Medical History  Diagnosis Date  . Atrial tachycardia     s/p DCCV 09/2009, s/p RFCA 03/2011, s/p DCCV 04/2011, s/p RFCA 09/17/11  . Coronary artery disease     Status post CABG in 1994  . CLL (chronic lymphoblastic leukemia)     Stage 0-1  . Hypercholesterolemia     Well Controlled  . Hypertension   . Cough     Consistant with ACE inhibitor mediated cough  . Malaria 1972    Hx of  . Sleep-disordered breathing 06/20/2011  . History of tobacco abuse     quit 1994    Past Surgical History  Procedure Laterality Date  . Coronary artery bypass graft  1994    By Dr. Cyndia Bent. This included an LIMA graft to the left circumflex coronary, a sequential saphenous vein graft to the diagonal and  the LAD, and a vein graft to the posterior descending and posterior lateral branches of the coronary artery.  . US echocardiography  09-07-09    Est EF 40-45%  . Cardioversion  06/03/2011    Procedure: CARDIOVERSION;  Surgeon: Deboraha Sprang, MD;  Location: [facility];  Service: Cardiovascular;  Laterality: N/A;  . Ep study and ablation  2013    by Dr Caryl Comes, repeat ablation by Dr Rayann Heman  . Atrial flutter ablation N/A 04/11/2011    Procedure: ATRIAL FLUTTER ABLATION;  Surgeon: Deboraha Sprang, MD;  Location: [facility] CATH LAB;  Service: Cardiovascular;  Laterality: N/A;  . Electrophysiology study N/A 04/11/2011    Procedure: ELECTROPHYSIOLOGY STUDY;  Surgeon: Deboraha Sprang, MD;  Location: [facility] CATH LAB;  Service: Cardiovascular;  Laterality: N/A;  . Atrial flutter ablation N/A 09/17/2011     Procedure: ATRIAL FLUTTER ABLATION;  Surgeon: Thompson Grayer, MD;  Location: [facility] CATH LAB;  Service: Cardiovascular;  Laterality: N/A;    History  Smoking status  . Former Smoker  . Quit date: 01/22/1992  Smokeless tobacco  . Not on file    History  Alcohol Use No    History reviewed. No pertinent family history.  Review of Systems: As noted in history of present illness.  All other systems were reviewed and are negative.  Physical Exam: BP 110/70 mmHg  Pulse 76  Ht 5\' 10"  (1.778 m)  Wt 221 lb 9 oz (100.5 kg)  BMI 31.79 kg/m2 He is a pleasant white male in no acute distress. He is normocephalic, atraumatic. Pupils are equal round and reactive to light accommodation. Extraocular movements are full. Oropharynx is clear. Neck is supple without JVD, adenopathy, or megaly, or bruits. Lungs are clear. Cardiac exam reveals a regular rate and rhythm without gallop, murmur, or click. Abdomen is obese, soft, nontender. He has good pedal pulses. He has no edema. He is alert and oriented x3. Cranial nerves II through XII are intact.  LABORATORY DATA: Lab Results  Component Value Date   WBC 21.8* 01/28/2014   HGB 13.7 01/28/2014   HCT 43.7 01/28/2014   PLT 129* 01/28/2014   GLUCOSE 139 08/05/2013   ALT 41 08/05/2013   AST 28 08/05/2013   NA 140 08/05/2013   K 4.1 08/05/2013   CL 105 01/02/2012   CREATININE 0.9 08/05/2013   BUN 17.2 08/05/2013   CO2 26 08/05/2013   INR 1.5* 04/09/2011    Assessment / Plan: 1. Recurrent atrial flutter. Now status post radiofrequency ablation x2. Last was in August of 2013. Now with asymptomatic recurrence. Continue metoprolol for rate control. Continue anticoagulation with Xarelto 20 mg daily.   2. Coronary disease status post CABG in 1994. Last nuclear stress test in January 2013 showed an inferior lateral infarct without ischemia. Patient is asymptomatic. Continue  beta blocker therapy. I have recommended updating a Lexiscan Myoview since we are 3  years out from his last evaluation.  3. Chronic congestive heart failure. Ejection fraction of approximately 40%. No evidence of volume overload. Continue ARB and beta blocker therapy.   4. CLL stage 0. Followed by oncology.  5. Hypertension, controlled.   6. Thrombocytopenia.

## 2014-03-22 HISTORY — PX: NM MYOVIEW LTD: HXRAD82

## 2014-03-25 ENCOUNTER — Telehealth (HOSPITAL_COMMUNITY): Payer: Self-pay

## 2014-03-25 NOTE — Telephone Encounter (Signed)
Encounter complete. 

## 2014-03-29 ENCOUNTER — Telehealth (HOSPITAL_COMMUNITY): Payer: Self-pay

## 2014-03-29 NOTE — Telephone Encounter (Signed)
Encounter complete. 

## 2014-03-30 ENCOUNTER — Ambulatory Visit (HOSPITAL_COMMUNITY)
Admission: RE | Admit: 2014-03-30 | Discharge: 2014-03-30 | Disposition: A | Discharge status: Home / Self Care | Payer: Medicare Other | Source: Ambulatory Visit | Attending: Internal Medicine | Admitting: Internal Medicine

## 2014-03-30 DIAGNOSIS — I10 Essential (primary) hypertension: Secondary | ICD-10-CM | POA: Diagnosis not present

## 2014-03-30 DIAGNOSIS — I5022 Chronic systolic (congestive) heart failure: Secondary | ICD-10-CM | POA: Diagnosis not present

## 2014-03-30 DIAGNOSIS — I484 Atypical atrial flutter: Secondary | ICD-10-CM

## 2014-03-30 DIAGNOSIS — I25810 Atherosclerosis of coronary artery bypass graft(s) without angina pectoris: Secondary | ICD-10-CM | POA: Diagnosis not present

## 2014-03-30 MED ORDER — TECHNETIUM TC 99M SESTAMIBI GENERIC - CARDIOLITE
10.8000 | Freq: Once | INTRAVENOUS | Status: AC | PRN
  Administered 2014-03-30: 11 via INTRAVENOUS

## 2014-03-30 MED ORDER — TECHNETIUM TC 99M SESTAMIBI GENERIC - CARDIOLITE
32.7000 | Freq: Once | INTRAVENOUS | Status: AC | PRN
  Administered 2014-03-30: 32.7 via INTRAVENOUS

## 2014-03-30 MED ORDER — REGADENOSON 0.4 MG/5ML IV SOLN
0.4000 mg | Freq: Once | INTRAVENOUS | Status: AC
  Administered 2014-03-30: 0.4 mg via INTRAVENOUS

## 2014-03-30 NOTE — Procedures (Addendum)
[facility] [address] [place] [address] [phone]  Cardiology Nuclear Med Study  Rodney Burns is a 69 y.o. male     MRN : [id number]     DOB: 12-20-1945  Procedure Date: 03/30/2014  Nuclear Med Background Indication for Stress Test:  Abnormal EKG and Follow up CAD History:  CAD;CABG X5-1994;Last NUC MPI on 02/21/2011-normal;EF=39%;Atrial Flutter;Ischemic Cardiomyopathy;Multiple cardioversions;Ablation-09/17/2011;CHF;AFIB;Malaria Cardiac Risk Factors: History of Smoking, Hypertension, Lipids and Overweight;cronic lymphatic leukemia  Symptoms:  DOE and SOB   Nuclear Pre-Procedure Caffeine/Decaff Intake:  9:00pm NPO After: 5:00am   IV Site: R Forearm  IV 0.9% NS with Angio Cath:  22g  Chest Size (in):  46" IV Started by: Rolene Course, RN  Height: 5\' 10"  (1.778 m)  Cup Size: n/a  BMI:  Body mass index is 31.71 kg/(m^2). Weight:  221 lb (100.245 kg)   Tech Comments:  n/a    Nuclear Med Study 1 or 2 day study: 1 day  Stress Test Type:  [place] Provider:  [name], MD   Resting Radionuclide: Technetium 23m Sestamibi  Resting Radionuclide Dose: 10.8 mCi   Stress Radionuclide:  Technetium 75m Sestamibi  Stress Radionuclide Dose: 32.7 mCi           Stress Protocol Rest HR: 82 Stress HR: 105  Rest BP: 126/82 Stress BP: 132/89  Exercise Time (min): n/a METS: n/a          Dose of Adenosine (mg):  n/a Dose of Lexiscan: 0.4 mg  Dose of Atropine (mg): n/a Dose of Dobutamine: n/a mcg/kg/min (at max HR)  Stress Test Technologist: Leane Para, CCT Nuclear Technologist: Imagene Riches, CNMT   Rest Procedure:  Myocardial perfusion imaging was performed at rest 45 minutes following the intravenous administration of Technetium 15m Sestamibi. Stress Procedure:  The patient received IV Lexiscan 0.4 mg over 15-seconds.  Technetium 19m Sestamibi IV injected at 30-seconds.  There were no significant  changes with Lexiscan.  Quantitative spect images were obtained after a 45 minute delay.  Transient Ischemic Dilatation (Normal <1.22):  1.12  QGS EDV:  187 ml QGS ESV:  128 ml LV Ejection Fraction: 32%  Rest ECG: Atrial flutter  Stress ECG: No significant change from baseline ECG  QPS Raw Data Images:  Patient motion noted. Stress Images:  Distal anterior, apical and inferior defect Rest Images:  distal anterior, apical and inferior defect Subtraction (SDS):  No evidence of ischemia.  Impression Exercise Capacity:  Lexiscan with no exercise. BP Response:  Normal blood pressure response. Clinical Symptoms:  There is dyspnea. ECG Impression:  There are scattered PVCs. Comparison with Prior Nuclear Study: No significant change from previous study  Overall Impression:  High risk stress nuclear study with large, moderate intensity fixed distal anterior, apical and inferior defect suggestive of scar. No reversible ischemia.  LV Wall Motion:  Global hypokinesis with distal anterior and apical akinesis. LVEF 32%  [name], MD, [facility] Board Certified in Nuclear Cardiology Attending Cardiologist [place], MD  03/30/2014 1:14 PM

## 2014-03-31 DIAGNOSIS — H2513 Age-related nuclear cataract, bilateral: Secondary | ICD-10-CM | POA: Diagnosis not present

## 2014-03-31 DIAGNOSIS — H4011X3 Primary open-angle glaucoma, severe stage: Secondary | ICD-10-CM | POA: Diagnosis not present

## 2014-03-31 DIAGNOSIS — D3132 Benign neoplasm of left choroid: Secondary | ICD-10-CM | POA: Diagnosis not present

## 2014-03-31 DIAGNOSIS — H4011X1 Primary open-angle glaucoma, mild stage: Secondary | ICD-10-CM | POA: Diagnosis not present

## 2014-04-12 ENCOUNTER — Other Ambulatory Visit: Payer: Self-pay | Admitting: Hematology and Oncology

## 2014-04-12 DIAGNOSIS — C9110 Chronic lymphocytic leukemia of B-cell type not having achieved remission: Secondary | ICD-10-CM

## 2014-04-12 MED ORDER — TRAMADOL HCL 50 MG PO TABS: 50.0000 mg | ORAL_TABLET | Freq: Four times a day (QID) | ORAL | Status: DC | PRN

## 2014-04-23 ENCOUNTER — Other Ambulatory Visit: Payer: Self-pay | Admitting: Cardiology

## 2014-06-04 ENCOUNTER — Other Ambulatory Visit: Payer: Self-pay | Admitting: Cardiology

## 2014-07-07 DIAGNOSIS — I2510 Atherosclerotic heart disease of native coronary artery without angina pectoris: Secondary | ICD-10-CM | POA: Diagnosis not present

## 2014-07-07 DIAGNOSIS — I10 Essential (primary) hypertension: Secondary | ICD-10-CM | POA: Diagnosis not present

## 2014-07-07 DIAGNOSIS — E785 Hyperlipidemia, unspecified: Secondary | ICD-10-CM | POA: Diagnosis not present

## 2014-07-07 DIAGNOSIS — C9110 Chronic lymphocytic leukemia of B-cell type not having achieved remission: Secondary | ICD-10-CM | POA: Diagnosis not present

## 2014-07-08 DIAGNOSIS — C9110 Chronic lymphocytic leukemia of B-cell type not having achieved remission: Secondary | ICD-10-CM | POA: Diagnosis not present

## 2014-07-08 DIAGNOSIS — E785 Hyperlipidemia, unspecified: Secondary | ICD-10-CM | POA: Diagnosis not present

## 2014-07-08 DIAGNOSIS — I2510 Atherosclerotic heart disease of native coronary artery without angina pectoris: Secondary | ICD-10-CM | POA: Diagnosis not present

## 2014-07-08 DIAGNOSIS — I10 Essential (primary) hypertension: Secondary | ICD-10-CM | POA: Diagnosis not present

## 2014-07-14 ENCOUNTER — Other Ambulatory Visit: Payer: Self-pay | Admitting: Hematology and Oncology

## 2014-07-14 ENCOUNTER — Other Ambulatory Visit: Payer: Self-pay | Admitting: *Deleted

## 2014-07-14 DIAGNOSIS — C9110 Chronic lymphocytic leukemia of B-cell type not having achieved remission: Secondary | ICD-10-CM

## 2014-07-14 MED ORDER — TRAMADOL HCL 50 MG PO TABS: 50.0000 mg | ORAL_TABLET | Freq: Four times a day (QID) | ORAL | Status: DC | PRN

## 2014-07-22 ENCOUNTER — Other Ambulatory Visit: Payer: Self-pay | Admitting: Cardiology

## 2014-07-22 ENCOUNTER — Other Ambulatory Visit: Payer: Self-pay

## 2014-07-22 MED ORDER — LOSARTAN POTASSIUM 50 MG PO TABS
50.0000 mg | ORAL_TABLET | Freq: Every day | ORAL | Status: DC
Start: 2014-07-22 — End: 2015-03-18

## 2014-07-22 NOTE — Telephone Encounter (Signed)
x

## 2014-08-03 NOTE — Assessment & Plan Note (Signed)
CLL stage I, lymphocytosis and lymphadenopathy diagnosed 07/31/2009, initial CT scan revealed lymphadenopathy and mildly enlarged spleen.  Treatment plan: Observation. I discussed with him the indications for treatment would be rapid doubling time on development of severe anemia thrombocytopenia, rapidly enlarging lymphadenopathy, B symptoms like fevers chills night sweats and weight loss.  Chronic thrombocytopenia: I suspect this may be related to mild immune thrombocytopenia. I do not attribute this to CLL. Since his platelet counts are stable, there is no indication to treat.  Return to clinic in 6 months for follow-up with blood tests

## 2014-08-04 ENCOUNTER — Other Ambulatory Visit (HOSPITAL_BASED_OUTPATIENT_CLINIC_OR_DEPARTMENT_OTHER): Payer: Medicare Other

## 2014-08-04 ENCOUNTER — Ambulatory Visit (HOSPITAL_BASED_OUTPATIENT_CLINIC_OR_DEPARTMENT_OTHER): Payer: Medicare Other | Admitting: Hematology and Oncology

## 2014-08-04 ENCOUNTER — Telehealth: Payer: Self-pay | Admitting: Hematology and Oncology

## 2014-08-04 ENCOUNTER — Encounter: Payer: Self-pay | Admitting: Hematology and Oncology

## 2014-08-04 VITALS — BP 119/63 | HR 72 | Temp 98.1°F | Resp 18 | Ht 70.0 in | Wt 215.8 lb

## 2014-08-04 DIAGNOSIS — C9110 Chronic lymphocytic leukemia of B-cell type not having achieved remission: Secondary | ICD-10-CM | POA: Diagnosis not present

## 2014-08-04 DIAGNOSIS — D696 Thrombocytopenia, unspecified: Secondary | ICD-10-CM | POA: Diagnosis not present

## 2014-08-04 LAB — COMPREHENSIVE METABOLIC PANEL (CC13)
ALT: 46 U/L (ref 0–55)
AST: 37 U/L — AB (ref 5–34)
Albumin: 4.1 g/dL (ref 3.5–5.0)
Alkaline Phosphatase: 47 U/L (ref 40–150)
Anion Gap: 6 mEq/L (ref 3–11)
BUN: 19.8 mg/dL (ref 7.0–26.0)
CALCIUM: 9.6 mg/dL (ref 8.4–10.4)
CHLORIDE: 108 meq/L (ref 98–109)
CO2: 27 mEq/L (ref 22–29)
Creatinine: 0.9 mg/dL (ref 0.7–1.3)
EGFR: 88 mL/min/{1.73_m2} — ABNORMAL LOW (ref >90)
GLUCOSE: 126 mg/dL (ref 70–140)
Potassium: 4.6 mEq/L (ref 3.5–5.1)
SODIUM: 141 meq/L (ref 136–145)
Total Bilirubin: 1.09 mg/dL (ref 0.20–1.20)
Total Protein: 6.4 g/dL (ref 6.4–8.3)

## 2014-08-04 LAB — CBC WITH DIFFERENTIAL/PLATELET
BASO%: 0.3 % (ref 0.0–2.0)
BASOS ABS: 0.1 10*3/uL (ref 0.0–0.1)
EOS%: 0.6 % (ref 0.0–7.0)
Eosinophils Absolute: 0.1 10*3/uL (ref 0.0–0.5)
HCT: 43.6 % (ref 38.4–49.9)
HGB: 13.8 g/dL (ref 13.0–17.1)
LYMPH#: 14.5 10*3/uL — AB (ref 0.9–3.3)
LYMPH%: 74.2 % — AB (ref 14.0–49.0)
MCH: 26.5 pg — AB (ref 27.2–33.4)
MCHC: 31.6 g/dL — AB (ref 32.0–36.0)
MCV: 84.1 fL (ref 79.3–98.0)
MONO#: 0.5 10*3/uL (ref 0.1–0.9)
MONO%: 2.4 % (ref 0.0–14.0)
NEUT#: 4.4 10*3/uL (ref 1.5–6.5)
NEUT%: 22.5 % — ABNORMAL LOW (ref 39.0–75.0)
Platelets: 108 10*3/uL — ABNORMAL LOW (ref 140–400)
RBC: 5.19 10*6/uL (ref 4.20–5.82)
RDW: 14.8 % — AB (ref 11.0–14.6)
WBC: 19.5 10*3/uL — ABNORMAL HIGH (ref 4.0–10.3)

## 2014-08-04 LAB — TECHNOLOGIST REVIEW

## 2014-08-04 LAB — LACTATE DEHYDROGENASE (CC13): LDH: 177 U/L (ref 125–245)

## 2014-08-04 NOTE — Progress Notes (Signed)
Patient Care Team: Hulan Fess, MD as PCP - General (Family Medicine)  DIAGNOSIS: CLL stage I (lymphadenopathy and lymphocytosis) Current treatment: Observation CHIEF COMPLIANT: Feeling well  INTERVAL HISTORY: Rodney Burns is a 69 year old with above-mentioned history of CLL who is here for routine follow-up. He reports no new symptoms or concerns. Denies any fevers chills night sweats or weight loss. Is been playing golf along with his son who has several impairments and he is very happy that he is doing well. His son has visual impairment.  REVIEW OF SYSTEMS:   Constitutional: Denies fevers, chills or abnormal weight loss Eyes: Denies blurriness of vision Ears, nose, mouth, throat, and face: Denies mucositis or sore throat Respiratory: Denies cough, dyspnea or wheezes Cardiovascular: Denies palpitation, chest discomfort or lower extremity swelling Gastrointestinal:  Denies nausea, heartburn or change in bowel habits Skin: Denies abnormal skin rashes Lymphatics: Denies new lymphadenopathy or easy bruising Neurological:Denies numbness, tingling or new weaknesses Behavioral/Psych: Mood is stable, no new changes   All other systems were reviewed with the patient and are negative.  I have reviewed the past medical history, past surgical history, social history and family history with the patient and they are unchanged from previous note.  ALLERGIES:  is allergic to penicillins; ace inhibitors; darvocet; and sulfa antibiotics.  MEDICATIONS:  Current Outpatient Prescriptions  Medication Sig Dispense Refill  . azithromycin (ZITHROMAX Z-PAK) 250 MG tablet Take 2 tabs 1st day and 1 tab daily for 5 days 6 each 0  . latanoprost (XALATAN) 0.005 % ophthalmic solution Place 1 drop into both eyes at bedtime.     [name] losartan (COZAAR) 50 MG tablet Take 1 tablet (50 mg total) by mouth daily. 90 tablet 2  . Magnesium Hydroxide (MAGNESIA PO) Take by mouth.    . metoprolol succinate (TOPROL-XL) 25 MG  24 hr tablet TAKE 1 TABLET DAILY 90 tablet 1  . Multiple Vitamin (MULTIVITAMIN) tablet Take 1 tablet by mouth daily.    . niacin (NIASPAN) 750 MG CR tablet Take 2 tablets (1,500 mg total) by mouth at bedtime. 180 tablet 2  . pramipexole (MIRAPEX) 0.125 MG tablet Take 0.125 mg by mouth at bedtime. For legs    . RABEprazole (ACIPHEX) 20 MG tablet Take 1 tablet (20 mg total) by mouth daily. 90 tablet 3  . rivaroxaban (XARELTO) 20 MG TABS tablet Take 1 tablet (20 mg total) by mouth daily with supper. 90 tablet 3  . traMADol (ULTRAM) 50 MG tablet Take 1 tablet (50 mg total) by mouth every 6 (six) hours as needed. 120 tablet 3  . VYTORIN 10-20 MG per tablet TAKE 1 TABLET AT BEDTIME 90 tablet 3   No current facility-administered medications for this visit.    PHYSICAL EXAMINATION: ECOG PERFORMANCE STATUS: 0 - Asymptomatic  Filed Vitals:   08/04/14 0833  BP: 119/63  Pulse: 72  Temp: 98.1 F (36.7 C)  Resp: 18   Filed Weights   08/04/14 0833  Weight: 215 lb 12.8 oz (97.886 kg)    GENERAL:alert, no distress and comfortable SKIN: skin color, texture, turgor are normal, no rashes or significant lesions EYES: normal, Conjunctiva are pink and non-injected, sclera clear OROPHARYNX:no exudate, no erythema and lips, buccal mucosa, and tongue normal  NECK: supple, thyroid normal size, non-tender, without nodularity LYMPH:  no palpable lymphadenopathy in the cervical, axillary or inguinal LUNGS: clear to auscultation and percussion with normal breathing effort HEART: regular rate & rhythm and no murmurs and no lower extremity edema ABDOMEN:abdomen soft, non-tender  and normal bowel sounds Musculoskeletal:no cyanosis of digits and no clubbing  NEURO: alert & oriented x 3 with fluent speech, no focal motor/sensory deficits  LABORATORY DATA:  I have reviewed the data as listed   Chemistry      Component Value Date/Time   NA 140 08/05/2013 0832   NA 140 09/12/2011 0824   K 4.1 08/05/2013 0832    K 4.6 09/12/2011 0824   CL 105 01/02/2012 0938   CL 106 09/12/2011 0824   CO2 26 08/05/2013 0832   CO2 28 09/12/2011 0824   BUN 17.2 08/05/2013 0832   BUN 11 09/12/2011 0824   CREATININE 0.9 08/05/2013 0832   CREATININE 1.05 09/12/2011 0824      Component Value Date/Time   CALCIUM 9.9 08/05/2013 0832   CALCIUM 9.0 09/12/2011 0824   ALKPHOS 46 08/05/2013 0832   ALKPHOS 41 09/12/2011 0824   AST 28 08/05/2013 0832   AST 23 09/12/2011 0824   ALT 41 08/05/2013 0832   ALT 30 09/12/2011 0824   BILITOT 1.63* 08/05/2013 0832   BILITOT 0.9 09/12/2011 0824       Lab Results  Component Value Date   WBC 19.5* 08/04/2014   HGB 13.8 08/04/2014   HCT 43.6 08/04/2014   MCV 84.1 08/04/2014   PLT 108* 08/04/2014   NEUTROABS 4.4 08/04/2014   ASSESSMENT & PLAN:  CLL (chronic lymphocytic leukemia) CLL stage I, lymphocytosis and lymphadenopathy diagnosed 07/31/2009, initial CT scan revealed lymphadenopathy and mildly enlarged spleen.  Treatment plan: Observation. I discussed with him the indications for treatment would be rapid doubling time on development of severe anemia thrombocytopenia, rapidly enlarging lymphadenopathy, B symptoms like fevers chills night sweats and weight loss.  Chronic thrombocytopenia: I suspect this may be related to mild immune thrombocytopenia. I do not attribute this to CLL. Since his platelet counts are stable, there is no indication to treat.  Return to clinic in 6 months for follow-up with blood tests    No orders of the defined types were placed in this encounter.   The patient has a good understanding of the overall plan. he agrees with it. he will call with any problems that may develop before the next visit here.   Rulon Eisenmenger, MD

## 2014-08-04 NOTE — Telephone Encounter (Signed)
Gave avs & calendar for January °

## 2014-08-05 ENCOUNTER — Other Ambulatory Visit: Payer: Medicare Other

## 2014-08-05 ENCOUNTER — Ambulatory Visit: Payer: Medicare Other | Admitting: Hematology and Oncology

## 2014-08-11 ENCOUNTER — Ambulatory Visit: Payer: Medicare Other | Admitting: Hematology and Oncology

## 2014-08-11 ENCOUNTER — Other Ambulatory Visit: Payer: Medicare Other

## 2014-09-13 ENCOUNTER — Ambulatory Visit (INDEPENDENT_AMBULATORY_CARE_PROVIDER_SITE_OTHER): Payer: Medicare Other | Admitting: Cardiology

## 2014-09-13 ENCOUNTER — Encounter: Payer: Self-pay | Admitting: Cardiology

## 2014-09-13 VITALS — BP 130/76 | Ht 70.5 in | Wt 216.7 lb

## 2014-09-13 DIAGNOSIS — I10 Essential (primary) hypertension: Secondary | ICD-10-CM | POA: Diagnosis not present

## 2014-09-13 DIAGNOSIS — I484 Atypical atrial flutter: Secondary | ICD-10-CM

## 2014-09-13 DIAGNOSIS — I5022 Chronic systolic (congestive) heart failure: Secondary | ICD-10-CM

## 2014-09-13 DIAGNOSIS — I25810 Atherosclerosis of coronary artery bypass graft(s) without angina pectoris: Secondary | ICD-10-CM | POA: Diagnosis not present

## 2014-09-13 MED ORDER — VARDENAFIL HCL 10 MG PO TABS: 10.0000 mg | ORAL_TABLET | Freq: Every day | ORAL | Status: DC | PRN

## 2014-09-13 NOTE — Patient Instructions (Addendum)
Continue your current therapy  Try Levitra 10 mg prn ED  I will see you in 6 months.  We will get a copy of your lab work with Dr. Rex Kras.

## 2014-09-13 NOTE — Progress Notes (Signed)
Rodney Burns Date of Birth: [birth year]   History of Present Illness: Rodney Burns is seen for followup of atrial flutter and CAD. He has a history of atrial flutter and has had multiple cardioversions. He had radiofrequency ablation in March and then again in August of 2013. He had recurrent atrial flutter and has been treated with rate control and anticoagulation.  He has a history of coronary disease and is status post CABG in 1994. He had a nuclear stress test in March 2016 which showed an inferior and anteroapical infarct without evidence of ischemia. Ejection fraction was 32%.  He also has a history of CLL.  On followup today he reports he is doing well.   He denies any dyspnea, chest pain, or palpitations. His energy level is good. He does note that he can't tolerate the heat as much as in the past. He reports his last cholesterol was 117. He does have ED and wants to try medication.   Current Outpatient Prescriptions on File Prior to Visit  Medication Sig Dispense Refill  . latanoprost (XALATAN) 0.005 % ophthalmic solution Place 1 drop into both eyes at bedtime.     [name] losartan (COZAAR) 50 MG tablet Take 1 tablet (50 mg total) by mouth daily. 90 tablet 2  . Magnesium Hydroxide (MAGNESIA PO) Take by mouth.    . metoprolol succinate (TOPROL-XL) 25 MG 24 hr tablet TAKE 1 TABLET DAILY 90 tablet 1  . Multiple Vitamin (MULTIVITAMIN) tablet Take 1 tablet by mouth daily.    . niacin (NIASPAN) 750 MG CR tablet Take 2 tablets (1,500 mg total) by mouth at bedtime. 180 tablet 2  . RABEprazole (ACIPHEX) 20 MG tablet Take 1 tablet (20 mg total) by mouth daily. 90 tablet 3  . rivaroxaban (XARELTO) 20 MG TABS tablet Take 1 tablet (20 mg total) by mouth daily with supper. 90 tablet 3  . traMADol (ULTRAM) 50 MG tablet Take 1 tablet (50 mg total) by mouth every 6 (six) hours as needed. 120 tablet 3  . VYTORIN 10-20 MG per tablet TAKE 1 TABLET AT BEDTIME 90 tablet 3   No current facility-administered medications on  file prior to visit.    Allergies  Allergen Reactions  . Penicillins Other (See Comments)    Put pt in hospital 50 yrs ago  . Ace Inhibitors     Cause a Cough  . Darvocet [Propoxyphene N-Acetaminophen] Rash    Tolerates tylenol  . Sulfa Antibiotics Rash    Past Medical History  Diagnosis Date  . Atrial tachycardia     s/p DCCV 09/2009, s/p RFCA 03/2011, s/p DCCV 04/2011, s/p RFCA 09/17/11  . Coronary artery disease     Status post CABG in 1994  . CLL (chronic lymphoblastic leukemia)     Stage 0-1  . Hypercholesterolemia     Well Controlled  . Hypertension   . Cough     Consistant with ACE inhibitor mediated cough  . Malaria 1972    Hx of  . Sleep-disordered breathing 06/20/2011  . History of tobacco abuse     quit 1994    Past Surgical History  Procedure Laterality Date  . Coronary artery bypass graft  1994    By Dr. Cyndia Bent. This included an LIMA graft to the left circumflex coronary, a sequential saphenous vein graft to the diagonal and the LAD, and a vein graft to the posterior descending and posterior lateral branches of the coronary artery.  . US echocardiography  09-07-09  Est EF 40-45%  . Cardioversion  06/03/2011    Procedure: CARDIOVERSION;  Surgeon: Deboraha Sprang, MD;  Location: [facility];  Service: Cardiovascular;  Laterality: N/A;  . Ep study and ablation  2013    by Dr Caryl Comes, repeat ablation by Dr Rayann Heman  . Atrial flutter ablation N/A 04/11/2011    Procedure: ATRIAL FLUTTER ABLATION;  Surgeon: Deboraha Sprang, MD;  Location: [facility] CATH LAB;  Service: Cardiovascular;  Laterality: N/A;  . Electrophysiology study N/A 04/11/2011    Procedure: ELECTROPHYSIOLOGY STUDY;  Surgeon: Deboraha Sprang, MD;  Location: [facility] CATH LAB;  Service: Cardiovascular;  Laterality: N/A;  . Atrial flutter ablation N/A 09/17/2011    Procedure: ATRIAL FLUTTER ABLATION;  Surgeon: Thompson Grayer, MD;  Location[facility] CATH LAB;  Service: Cardiovascular;  Laterality: N/A;    History  Smoking status  .  Former Smoker  . Quit date: 01/22/1992  Smokeless tobacco  . Not on file    History  Alcohol Use No    History reviewed. No pertinent family history.  Review of Systems: As noted in history of present illness.  All other systems were reviewed and are negative.  Physical Exam: BP 130/76 mmHg  Ht 5' 10.5" (1.791 m)  Wt 98.294 kg (216 lb 11.2 oz)  BMI 30.64 kg/m2 He is a pleasant white male in no acute distress. He is normocephalic, atraumatic. Pupils are equal round and reactive to light accommodation. Extraocular movements are full. Oropharynx is clear. Neck is supple without JVD, adenopathy, or megaly, or bruits. Lungs are clear. Cardiac exam reveals an irregular rate and rhythm without gallop, murmur, or click. Abdomen is obese, soft, nontender. He has good pedal pulses. He has no edema. He is alert and oriented x3. Cranial nerves II through XII are intact.  LABORATORY DATA: Lab Results  Component Value Date   WBC 19.5* 08/04/2014   HGB 13.8 08/04/2014   HCT 43.6 08/04/2014   PLT 108* 08/04/2014   GLUCOSE 126 08/04/2014   ALT 46 08/04/2014   AST 37* 08/04/2014   NA 141 08/04/2014   K 4.6 08/04/2014   CL 105 01/02/2012   CREATININE 0.9 08/04/2014   BUN 19.8 08/04/2014   CO2 27 08/04/2014   INR 1.5* 04/09/2011   Ecg today shows atrial flutter with PVCs. Rate 79. Demand ventricular pacing. I have personally reviewed and interpreted this study.   Assessment / Plan: 1. Recurrent atrial flutter. Now status post radiofrequency ablation x2. Now with asymptomatic recurrence. Continue metoprolol for rate control. Continue anticoagulation with Xarelto 20 mg daily.   2. Coronary disease status post CABG in 1994. Patient is asymptomatic. Continue  beta blocker therapy. Myvoview study in March showed inferior and anteroapical scar without ischemia. EF 32%. Findings similar to prior Myoview in 2013.   3. Chronic congestive heart failure. Ejection fraction of approximately 35-40%. No  evidence of volume overload. Continue ARB and beta blocker therapy.   4. CLL stage 0. Followed by oncology.  5. Hypertension, controlled.   6. Thrombocytopenia.  7. ED will give trial of Levitra 10 mg.

## 2014-09-15 ENCOUNTER — Telehealth: Payer: Self-pay | Admitting: Cardiology

## 2014-09-15 ENCOUNTER — Other Ambulatory Visit: Payer: Self-pay | Admitting: Cardiology

## 2014-09-15 NOTE — Telephone Encounter (Signed)
REFILL 

## 2014-09-15 NOTE — Telephone Encounter (Signed)
Pt called in stating that a prior authorization was going to be needed for his Levitra medicaiton. He sais a prescription for 18 pills for 90 days needed to be issued and this needed to be called in to [id number]  Thanks

## 2014-09-16 NOTE — Telephone Encounter (Signed)
Spoke to patient 09/15/14.Received a fax from [facility] preferred alternative for levitra is viagra.Viagra 100 mg take 1/2 to 1 tablet as needed.Prescription faxed to [employer] at fax # [phone].

## 2014-09-21 ENCOUNTER — Encounter: Payer: Self-pay | Admitting: Cardiology

## 2014-10-05 DIAGNOSIS — H00026 Hordeolum internum left eye, unspecified eyelid: Secondary | ICD-10-CM | POA: Diagnosis not present

## 2014-10-05 DIAGNOSIS — H4011X1 Primary open-angle glaucoma, mild stage: Secondary | ICD-10-CM | POA: Diagnosis not present

## 2014-10-05 DIAGNOSIS — H4011X2 Primary open-angle glaucoma, moderate stage: Secondary | ICD-10-CM | POA: Diagnosis not present

## 2014-10-05 DIAGNOSIS — D3132 Benign neoplasm of left choroid: Secondary | ICD-10-CM | POA: Diagnosis not present

## 2014-10-13 DIAGNOSIS — H4011X2 Primary open-angle glaucoma, moderate stage: Secondary | ICD-10-CM | POA: Diagnosis not present

## 2014-10-27 DIAGNOSIS — H40032 Anatomical narrow angle, left eye: Secondary | ICD-10-CM | POA: Diagnosis not present

## 2014-11-03 DIAGNOSIS — Z23 Encounter for immunization: Secondary | ICD-10-CM | POA: Diagnosis not present

## 2014-11-05 ENCOUNTER — Other Ambulatory Visit: Payer: Self-pay | Admitting: Cardiology

## 2014-11-10 DIAGNOSIS — H40033 Anatomical narrow angle, bilateral: Secondary | ICD-10-CM | POA: Diagnosis not present

## 2014-11-10 DIAGNOSIS — H524 Presbyopia: Secondary | ICD-10-CM | POA: Diagnosis not present

## 2014-11-10 DIAGNOSIS — H40011 Open angle with borderline findings, low risk, right eye: Secondary | ICD-10-CM | POA: Diagnosis not present

## 2014-11-10 DIAGNOSIS — H401112 Primary open-angle glaucoma, right eye, moderate stage: Secondary | ICD-10-CM | POA: Diagnosis not present

## 2014-11-10 DIAGNOSIS — H401121 Primary open-angle glaucoma, left eye, mild stage: Secondary | ICD-10-CM | POA: Diagnosis not present

## 2014-11-17 ENCOUNTER — Other Ambulatory Visit: Payer: Self-pay | Admitting: *Deleted

## 2014-11-17 DIAGNOSIS — C9110 Chronic lymphocytic leukemia of B-cell type not having achieved remission: Secondary | ICD-10-CM

## 2014-11-17 MED ORDER — TRAMADOL HCL 50 MG PO TABS: 50.0000 mg | ORAL_TABLET | Freq: Four times a day (QID) | ORAL | Status: DC | PRN

## 2014-12-02 ENCOUNTER — Other Ambulatory Visit: Payer: Self-pay | Admitting: Cardiology

## 2014-12-02 ENCOUNTER — Other Ambulatory Visit: Payer: Self-pay | Admitting: *Deleted

## 2014-12-02 DIAGNOSIS — H00026 Hordeolum internum left eye, unspecified eyelid: Secondary | ICD-10-CM | POA: Diagnosis not present

## 2014-12-02 DIAGNOSIS — H401112 Primary open-angle glaucoma, right eye, moderate stage: Secondary | ICD-10-CM | POA: Diagnosis not present

## 2014-12-02 DIAGNOSIS — H401121 Primary open-angle glaucoma, left eye, mild stage: Secondary | ICD-10-CM | POA: Diagnosis not present

## 2014-12-02 MED ORDER — METOPROLOL SUCCINATE ER 25 MG PO TB24: 25.0000 mg | ORAL_TABLET | Freq: Every day | ORAL | Status: DC

## 2014-12-09 DIAGNOSIS — R05 Cough: Secondary | ICD-10-CM | POA: Diagnosis not present

## 2014-12-09 DIAGNOSIS — J329 Chronic sinusitis, unspecified: Secondary | ICD-10-CM | POA: Diagnosis not present

## 2014-12-21 DIAGNOSIS — H401112 Primary open-angle glaucoma, right eye, moderate stage: Secondary | ICD-10-CM | POA: Diagnosis not present

## 2014-12-21 DIAGNOSIS — H401121 Primary open-angle glaucoma, left eye, mild stage: Secondary | ICD-10-CM | POA: Diagnosis not present

## 2015-02-01 ENCOUNTER — Telehealth: Payer: Self-pay | Admitting: *Deleted

## 2015-02-01 NOTE — Assessment & Plan Note (Signed)
CLL stage I, lymphocytosis and lymphadenopathy diagnosed 07/31/2009, initial CT scan revealed lymphadenopathy and mildly enlarged spleen.  Treatment plan: Observation. I discussed with him the indications for treatment would be rapid doubling time on development of severe anemia thrombocytopenia, rapidly enlarging lymphadenopathy, B symptoms like fevers chills night sweats and weight loss.  Chronic thrombocytopenia: I suspect this may be related to mild immune thrombocytopenia. Since his platelet counts are stable, there is no indication to treat.  Return to clinic in 6 months for follow-up with blood tests

## 2015-02-01 NOTE — Telephone Encounter (Signed)
"  We received a call that someone has appointment tomorrow.  We both are patients there.  Who has an appointment tomorrow?"  Advised spouse Rodney Burns is to be seen tomorrow.  No further questions.

## 2015-02-02 ENCOUNTER — Encounter: Payer: Self-pay | Admitting: Hematology and Oncology

## 2015-02-02 ENCOUNTER — Ambulatory Visit (HOSPITAL_BASED_OUTPATIENT_CLINIC_OR_DEPARTMENT_OTHER): Payer: Medicare Other | Admitting: Hematology and Oncology

## 2015-02-02 ENCOUNTER — Other Ambulatory Visit (HOSPITAL_BASED_OUTPATIENT_CLINIC_OR_DEPARTMENT_OTHER): Payer: Medicare Other

## 2015-02-02 VITALS — BP 101/82 | HR 78 | Temp 98.0°F | Resp 19 | Wt 215.8 lb

## 2015-02-02 DIAGNOSIS — D696 Thrombocytopenia, unspecified: Secondary | ICD-10-CM

## 2015-02-02 DIAGNOSIS — C9110 Chronic lymphocytic leukemia of B-cell type not having achieved remission: Secondary | ICD-10-CM

## 2015-02-02 LAB — COMPREHENSIVE METABOLIC PANEL
ALT: 29 U/L (ref 0–55)
AST: 24 U/L (ref 5–34)
Albumin: 4 g/dL (ref 3.5–5.0)
Alkaline Phosphatase: 45 U/L (ref 40–150)
Anion Gap: 7 mEq/L (ref 3–11)
BUN: 14 mg/dL (ref 7.0–26.0)
CHLORIDE: 109 meq/L (ref 98–109)
CO2: 25 meq/L (ref 22–29)
CREATININE: 0.9 mg/dL (ref 0.7–1.3)
Calcium: 8.9 mg/dL (ref 8.4–10.4)
EGFR: 84 mL/min/{1.73_m2} — ABNORMAL LOW (ref >90)
GLUCOSE: 133 mg/dL (ref 70–140)
POTASSIUM: 4.3 meq/L (ref 3.5–5.1)
SODIUM: 140 meq/L (ref 136–145)
Total Bilirubin: 0.96 mg/dL (ref 0.20–1.20)
Total Protein: 6.3 g/dL — ABNORMAL LOW (ref 6.4–8.3)

## 2015-02-02 LAB — CBC WITH DIFFERENTIAL/PLATELET
BASO%: 0.2 % (ref 0.0–2.0)
Basophils Absolute: 0 10*3/uL (ref 0.0–0.1)
EOS%: 0.6 % (ref 0.0–7.0)
Eosinophils Absolute: 0.1 10*3/uL (ref 0.0–0.5)
HCT: 42.1 % (ref 38.4–49.9)
HGB: 13.2 g/dL (ref 13.0–17.1)
LYMPH%: 79.8 % — AB (ref 14.0–49.0)
MCH: 25.8 pg — ABNORMAL LOW (ref 27.2–33.4)
MCHC: 31.4 g/dL — AB (ref 32.0–36.0)
MCV: 82.2 fL (ref 79.3–98.0)
MONO#: 0.5 10*3/uL (ref 0.1–0.9)
MONO%: 2.5 % (ref 0.0–14.0)
NEUT%: 16.9 % — AB (ref 39.0–75.0)
NEUTROS ABS: 3.5 10*3/uL (ref 1.5–6.5)
Platelets: 109 10*3/uL — ABNORMAL LOW (ref 140–400)
RBC: 5.12 10*6/uL (ref 4.20–5.82)
RDW: 14.8 % — ABNORMAL HIGH (ref 11.0–14.6)
WBC: 20.9 10*3/uL — AB (ref 4.0–10.3)
lymph#: 16.7 10*3/uL — ABNORMAL HIGH (ref 0.9–3.3)

## 2015-02-02 LAB — LACTATE DEHYDROGENASE: LDH: 187 U/L (ref 125–245)

## 2015-02-02 LAB — TECHNOLOGIST REVIEW

## 2015-02-02 NOTE — Progress Notes (Signed)
Patient Care Team: Hulan Fess, MD as PCP - General (Family Medicine)  DIAGNOSIS: CLL stage 0  CHIEF COMPLIANT: follow-up of CLL  INTERVAL HISTORY: Rodney Burns is a 70 year old with above-mentioned history of CLL who is currently on surveillance and observation with 6 month blood work. He reports no new problems or concerns. He denies any fevers night sweats or weight loss. He had blood work today and is here to review those results.  REVIEW OF SYSTEMS:   Constitutional: Denies fevers, chills or abnormal weight loss Eyes: Denies blurriness of vision Ears, nose, mouth, throat, and face: Denies mucositis or sore throat Respiratory: Denies cough, dyspnea or wheezes Cardiovascular: Denies palpitation, chest discomfort Gastrointestinal:  Denies nausea, heartburn or change in bowel habits Skin: Denies abnormal skin rashes Lymphatics: Denies new lymphadenopathy or easy bruising Neurological:Denies numbness, tingling or new weaknesses Behavioral/Psych: Mood is stable, no new changes  Extremities: No lower extremity edema  All other systems were reviewed with the patient and are negative.  I have reviewed the past medical history, past surgical history, social history and family history with the patient and they are unchanged from previous note.  ALLERGIES:  is allergic to penicillins; ace inhibitors; darvocet; and sulfa antibiotics.  MEDICATIONS:  Current Outpatient Prescriptions  Medication Sig Dispense Refill  . Fiber, Guar Gum, CHEW Chew 1 capsule by mouth daily.    [name] latanoprost (XALATAN) 0.005 % ophthalmic solution Place 1 drop into both eyes at bedtime.     [name] losartan (COZAAR) 50 MG tablet Take 1 tablet (50 mg total) by mouth daily. 90 tablet 2  . Magnesium Hydroxide (MAGNESIA PO) Take by mouth.    . metoprolol succinate (TOPROL-XL) 25 MG 24 hr tablet Take 1 tablet (25 mg total) by mouth daily. 90 tablet 1  . Multiple Vitamin (MULTIVITAMIN) tablet Take 1 tablet by mouth daily.     . niacin (NIASPAN) 750 MG CR tablet TAKE 2 TABLETS (1,500 MG TOTAL) AT BEDTIME 180 tablet 2  . pramipexole (MIRAPEX) 0.25 MG tablet Take 1 tablet by mouth at bedtime.    . RABEprazole (ACIPHEX) 20 MG tablet Take 1 tablet (20 mg total) by mouth daily. 90 tablet 3  . rivaroxaban (XARELTO) 20 MG TABS tablet Take 1 tablet (20 mg total) by mouth daily with supper. 90 tablet 3  . sildenafil (VIAGRA) 100 MG tablet Take 1 tablet (100 mg total) by mouth daily as needed for erectile dysfunction. 18 tablet 3  . traMADol (ULTRAM) 50 MG tablet Take 1 tablet (50 mg total) by mouth every 6 (six) hours as needed. 120 tablet 3  . VYTORIN 10-20 MG per tablet TAKE 1 TABLET AT BEDTIME 90 tablet 2   No current facility-administered medications for this visit.    PHYSICAL EXAMINATION: ECOG PERFORMANCE STATUS: 0 - Asymptomatic  Filed Vitals:   02/02/15 0955  BP: 101/82  Pulse: 78  Temp: 98 F (36.7 C)  Resp: 19   Filed Weights   02/02/15 0955  Weight: 215 lb 12.8 oz (97.886 kg)    GENERAL:alert, no distress and comfortable SKIN: skin color, texture, turgor are normal, no rashes or significant lesions EYES: normal, Conjunctiva are pink and non-injected, sclera clear OROPHARYNX:no exudate, no erythema and lips, buccal mucosa, and tongue normal  NECK: supple, thyroid normal size, non-tender, without nodularity LYMPH:  no palpable lymphadenopathy in the cervical, axillary or inguinal LUNGS: clear to auscultation and percussion with normal breathing effort HEART: regular rate & rhythm and no murmurs and no lower  extremity edema ABDOMEN:abdomen soft, non-tender and normal bowel sounds MUSCULOSKELETAL:no cyanosis of digits and no clubbing  NEURO: alert & oriented x 3 with fluent speech, no focal motor/sensory deficits EXTREMITIES: No lower extremity edema  LABORATORY DATA:  I have reviewed the data as listed   Chemistry      Component Value Date/Time   NA 140 02/02/2015 0944   NA 140 09/12/2011  0824   K 4.3 02/02/2015 0944   K 4.6 09/12/2011 0824   CL 105 01/02/2012 0938   CL 106 09/12/2011 0824   CO2 25 02/02/2015 0944   CO2 28 09/12/2011 0824   BUN 14.0 02/02/2015 0944   BUN 11 09/12/2011 0824   CREATININE 0.9 02/02/2015 0944   CREATININE 1.05 09/12/2011 0824      Component Value Date/Time   CALCIUM 8.9 02/02/2015 0944   CALCIUM 9.0 09/12/2011 0824   ALKPHOS 45 02/02/2015 0944   ALKPHOS 41 09/12/2011 0824   AST 24 02/02/2015 0944   AST 23 09/12/2011 0824   ALT 29 02/02/2015 0944   ALT 30 09/12/2011 0824   BILITOT 0.96 02/02/2015 0944   BILITOT 0.9 09/12/2011 0824       Lab Results  Component Value Date   WBC 20.9* 02/02/2015   HGB 13.2 02/02/2015   HCT 42.1 02/02/2015   MCV 82.2 02/02/2015   PLT 109* 02/02/2015   NEUTROABS 3.5 02/02/2015     ASSESSMENT & PLAN:  CLL (chronic lymphocytic leukemia) CLL stage I, lymphocytosis and lymphadenopathy diagnosed 07/31/2009, initial CT scan revealed lymphadenopathy and mildly enlarged spleen.  Treatment plan: Observation. I discussed with him the indications for treatment would be rapid doubling time on development of severe anemia thrombocytopenia, rapidly enlarging lymphadenopathy, B symptoms like fevers chills night sweats and weight loss.  Chronic thrombocytopenia: I suspect this may be related to mild immune thrombocytopenia. Since his platelet counts are stable, there is no indication to treat. Blood work was reviewed. He does not have any change in his absolute lymphocyte count. He does not have any significant anemia.   Return to clinic in 6 months for follow-up with blood tests. We will need to coordinate it with his wife's appointments.   No orders of the defined types were placed in this encounter.   The patient has a good understanding of the overall plan. he agrees with it. he will call with any problems that may develop before the next visit here.   Rulon Eisenmenger, MD 02/02/2015

## 2015-02-02 NOTE — Addendum Note (Signed)
Addended by: Prentiss Bells on: 02/02/2015 01:59 PM   Modules accepted: Medications

## 2015-02-14 ENCOUNTER — Other Ambulatory Visit: Payer: Self-pay | Admitting: Cardiology

## 2015-02-15 ENCOUNTER — Inpatient Hospital Stay (HOSPITAL_COMMUNITY)
Admission: EM | Admit: 2015-02-15 | Discharge: 2015-02-16 | DRG: 315 | Disposition: A | Discharge status: Home / Self Care | Payer: Medicare Other | Attending: Cardiology | Admitting: Cardiology

## 2015-02-15 ENCOUNTER — Encounter (HOSPITAL_COMMUNITY): Payer: Self-pay | Admitting: Emergency Medicine

## 2015-02-15 ENCOUNTER — Emergency Department (HOSPITAL_COMMUNITY): Payer: Medicare Other

## 2015-02-15 DIAGNOSIS — I10 Essential (primary) hypertension: Secondary | ICD-10-CM | POA: Diagnosis not present

## 2015-02-15 DIAGNOSIS — Z886 Allergy status to analgesic agent status: Secondary | ICD-10-CM | POA: Diagnosis not present

## 2015-02-15 DIAGNOSIS — I484 Atypical atrial flutter: Secondary | ICD-10-CM | POA: Diagnosis not present

## 2015-02-15 DIAGNOSIS — Z7901 Long term (current) use of anticoagulants: Secondary | ICD-10-CM

## 2015-02-15 DIAGNOSIS — E7800 Pure hypercholesterolemia, unspecified: Secondary | ICD-10-CM | POA: Diagnosis not present

## 2015-02-15 DIAGNOSIS — I2510 Atherosclerotic heart disease of native coronary artery without angina pectoris: Secondary | ICD-10-CM

## 2015-02-15 DIAGNOSIS — Z87891 Personal history of nicotine dependence: Secondary | ICD-10-CM | POA: Diagnosis not present

## 2015-02-15 DIAGNOSIS — C9110 Chronic lymphocytic leukemia of B-cell type not having achieved remission: Secondary | ICD-10-CM | POA: Diagnosis present

## 2015-02-15 DIAGNOSIS — I252 Old myocardial infarction: Secondary | ICD-10-CM | POA: Diagnosis not present

## 2015-02-15 DIAGNOSIS — I255 Ischemic cardiomyopathy: Secondary | ICD-10-CM | POA: Diagnosis present

## 2015-02-15 DIAGNOSIS — Z880 Allergy status to penicillin: Secondary | ICD-10-CM | POA: Diagnosis not present

## 2015-02-15 DIAGNOSIS — R0789 Other chest pain: Secondary | ICD-10-CM | POA: Diagnosis not present

## 2015-02-15 DIAGNOSIS — I319 Disease of pericardium, unspecified: Secondary | ICD-10-CM | POA: Diagnosis not present

## 2015-02-15 DIAGNOSIS — Z882 Allergy status to sulfonamides status: Secondary | ICD-10-CM | POA: Diagnosis not present

## 2015-02-15 DIAGNOSIS — R161 Splenomegaly, not elsewhere classified: Secondary | ICD-10-CM | POA: Diagnosis not present

## 2015-02-15 DIAGNOSIS — I5022 Chronic systolic (congestive) heart failure: Secondary | ICD-10-CM | POA: Diagnosis present

## 2015-02-15 DIAGNOSIS — I4892 Unspecified atrial flutter: Secondary | ICD-10-CM | POA: Diagnosis present

## 2015-02-15 DIAGNOSIS — Z951 Presence of aortocoronary bypass graft: Secondary | ICD-10-CM | POA: Diagnosis not present

## 2015-02-15 DIAGNOSIS — I110 Hypertensive heart disease with heart failure: Secondary | ICD-10-CM | POA: Diagnosis not present

## 2015-02-15 DIAGNOSIS — E785 Hyperlipidemia, unspecified: Secondary | ICD-10-CM | POA: Diagnosis present

## 2015-02-15 DIAGNOSIS — H409 Unspecified glaucoma: Secondary | ICD-10-CM | POA: Diagnosis not present

## 2015-02-15 DIAGNOSIS — R079 Chest pain, unspecified: Secondary | ICD-10-CM | POA: Diagnosis present

## 2015-02-15 HISTORY — DX: Unspecified glaucoma: H40.9

## 2015-02-15 HISTORY — DX: Ischemic cardiomyopathy: I25.5

## 2015-02-15 HISTORY — DX: Unspecified cataract: H26.9

## 2015-02-15 HISTORY — DX: Atherosclerotic heart disease of native coronary artery without angina pectoris: I25.10

## 2015-02-15 HISTORY — DX: Disease of pericardium, unspecified: I31.9

## 2015-02-15 HISTORY — DX: Chronic systolic (congestive) heart failure: I50.22

## 2015-02-15 LAB — BASIC METABOLIC PANEL
Anion gap: 11 (ref 5–15)
BUN: 16 mg/dL (ref 6–20)
CO2: 23 mmol/L (ref 22–32)
Calcium: 9.9 mg/dL (ref 8.9–10.3)
Chloride: 106 mmol/L (ref 101–111)
Creatinine, Ser: 0.87 mg/dL (ref 0.61–1.24)
GFR calc Af Amer: >60 mL/min (ref >60)
GFR calc non Af Amer: >60 mL/min (ref >60)
Glucose, Bld: 116 mg/dL — ABNORMAL HIGH (ref 65–99)
Potassium: 4.6 mmol/L (ref 3.5–5.1)
Sodium: 140 mmol/L (ref 135–145)

## 2015-02-15 LAB — CBC
HCT: 46.1 % (ref 39.0–52.0)
Hemoglobin: 14.3 g/dL (ref 13.0–17.0)
MCH: 26.5 pg (ref 26.0–34.0)
MCHC: 31 g/dL (ref 30.0–36.0)
MCV: 85.5 fL (ref 78.0–100.0)
Platelets: 115 10*3/uL — ABNORMAL LOW (ref 150–400)
RBC: 5.39 MIL/uL (ref 4.22–5.81)
RDW: 14.3 % (ref 11.5–15.5)
WBC: 22.6 10*3/uL — ABNORMAL HIGH (ref 4.0–10.5)

## 2015-02-15 LAB — SEDIMENTATION RATE: Sed Rate: 4 mm/hr (ref 0–16)

## 2015-02-15 LAB — C-REACTIVE PROTEIN: CRP: 1.4 mg/dL — ABNORMAL HIGH (ref <1.0)

## 2015-02-15 LAB — BRAIN NATRIURETIC PEPTIDE: B Natriuretic Peptide: 175.5 pg/mL — ABNORMAL HIGH (ref 0.0–100.0)

## 2015-02-15 LAB — I-STAT TROPONIN, ED: Troponin i, poc: 0.01 ng/mL (ref 0.00–0.08)

## 2015-02-15 LAB — TROPONIN I: Troponin I: <0.03 ng/mL (ref <0.031)

## 2015-02-15 MED ORDER — ALBUTEROL SULFATE HFA 108 (90 BASE) MCG/ACT IN AERS: 2.0000 | INHALATION_SPRAY | RESPIRATORY_TRACT | Status: DC

## 2015-02-15 MED ORDER — IBUPROFEN 600 MG PO TABS: 600.0000 mg | ORAL_TABLET | Freq: Three times a day (TID) | ORAL | Status: DC

## 2015-02-15 MED ORDER — PRAMIPEXOLE DIHYDROCHLORIDE 0.25 MG PO TABS
0.2500 mg | ORAL_TABLET | Freq: Every day | ORAL | Status: DC
  Administered 2015-02-15: 0.25 mg via ORAL
  Filled 2015-02-15 (×2): qty 1

## 2015-02-15 MED ORDER — LATANOPROST 0.005 % OP SOLN
1.0000 [drp] | Freq: Every day | OPHTHALMIC | Status: DC
  Administered 2015-02-15: 1 [drp] via OPHTHALMIC
  Filled 2015-02-15: qty 2.5

## 2015-02-15 MED ORDER — IBUPROFEN 200 MG PO TABS: 400.0000 mg | ORAL_TABLET | Freq: Three times a day (TID) | ORAL | Status: DC

## 2015-02-15 MED ORDER — IBUPROFEN 200 MG PO TABS: 200.0000 mg | ORAL_TABLET | Freq: Three times a day (TID) | ORAL | Status: DC

## 2015-02-15 MED ORDER — FAMOTIDINE 20 MG PO TABS
20.0000 mg | ORAL_TABLET | Freq: Once | ORAL | Status: AC
  Administered 2015-02-15: 20 mg via ORAL
  Filled 2015-02-15: qty 1

## 2015-02-15 MED ORDER — MAGNESIUM OXIDE 400 (241.3 MG) MG PO TABS
400.0000 mg | ORAL_TABLET | Freq: Every day | ORAL | Status: DC
  Administered 2015-02-15: 400 mg via ORAL
  Filled 2015-02-15: qty 1

## 2015-02-15 MED ORDER — LOSARTAN POTASSIUM 50 MG PO TABS
50.0000 mg | ORAL_TABLET | Freq: Every day | ORAL | Status: DC
  Administered 2015-02-16: 50 mg via ORAL
  Filled 2015-02-15: qty 1

## 2015-02-15 MED ORDER — COLCHICINE 0.6 MG PO TABS
0.6000 mg | ORAL_TABLET | Freq: Two times a day (BID) | ORAL | Status: DC
  Administered 2015-02-15 – 2015-02-16 (×2): 0.6 mg via ORAL
  Filled 2015-02-15 (×2): qty 1

## 2015-02-15 MED ORDER — PANTOPRAZOLE SODIUM 40 MG PO TBEC
40.0000 mg | DELAYED_RELEASE_TABLET | Freq: Every day | ORAL | Status: DC
  Administered 2015-02-15 – 2015-02-16 (×2): 40 mg via ORAL
  Filled 2015-02-15 (×2): qty 1

## 2015-02-15 MED ORDER — METOPROLOL SUCCINATE ER 25 MG PO TB24
25.0000 mg | ORAL_TABLET | Freq: Every day | ORAL | Status: DC
  Administered 2015-02-16: 25 mg via ORAL
  Filled 2015-02-15: qty 1

## 2015-02-15 MED ORDER — RIVAROXABAN 20 MG PO TABS
20.0000 mg | ORAL_TABLET | Freq: Every day | ORAL | Status: DC
Start: 2015-02-15 — End: 2015-02-16
  Administered 2015-02-15: 20 mg via ORAL
  Filled 2015-02-15 (×2): qty 1

## 2015-02-15 MED ORDER — IBUPROFEN 800 MG PO TABS
800.0000 mg | ORAL_TABLET | Freq: Three times a day (TID) | ORAL | Status: DC
  Administered 2015-02-15 – 2015-02-16 (×2): 800 mg via ORAL
  Filled 2015-02-15 (×2): qty 1

## 2015-02-15 MED ORDER — NIACIN ER (ANTIHYPERLIPIDEMIC) 500 MG PO TBCR
1500.0000 mg | EXTENDED_RELEASE_TABLET | Freq: Every day | ORAL | Status: DC
Start: 2015-02-15 — End: 2015-02-16
  Administered 2015-02-15: 1500 mg via ORAL
  Filled 2015-02-15 (×2): qty 3

## 2015-02-15 MED ORDER — DORZOLAMIDE HCL 2 % OP SOLN
1.0000 [drp] | Freq: Two times a day (BID) | OPHTHALMIC | Status: DC
Start: 2015-02-15 — End: 2015-02-16
  Administered 2015-02-15 – 2015-02-16 (×2): 1 [drp] via OPHTHALMIC
  Filled 2015-02-15: qty 10

## 2015-02-15 MED ORDER — EZETIMIBE-SIMVASTATIN 10-20 MG PO TABS
1.0000 | ORAL_TABLET | Freq: Every day | ORAL | Status: DC
  Administered 2015-02-15: 1 via ORAL
  Filled 2015-02-15 (×2): qty 1

## 2015-02-15 MED ORDER — IBUPROFEN 200 MG PO TABS
600.0000 mg | ORAL_TABLET | Freq: Once | ORAL | Status: AC
  Administered 2015-02-15: 600 mg via ORAL
  Filled 2015-02-15: qty 3

## 2015-02-15 MED ORDER — COLCHICINE 0.6 MG PO TABS: 0.6000 mg | ORAL_TABLET | Freq: Every day | ORAL | Status: DC

## 2015-02-15 NOTE — ED Provider Notes (Signed)
CSN: [id number]     Arrival date & time 02/15/15  1339 History   First MD Initiated Contact with Patient 02/15/15 1610     Chief Complaint  Patient presents with  . Chest Pain     (Consider location/radiation/quality/duration/timing/severity/associated sxs/prior Treatment) HPI   70 year old male with chest pain. Onset around 3:00 this morning while in bed. Describes a pressure in his chest. Pain is worse when laying back. He feels better when sitting up. Sometimes has radiation of the pain into his back. Pain is mostly dull but does occasionally get some sharper pain. No acute respiratory complaints. No fevers or chills. Patient does have an extensive cardiac history and states that previous anginal symptoms were primarily nausea and pain in his left upper extremity. He denies any symptoms currently. No fevers or chills. No unusual swelling. Reports compliance with his medications.  Past Medical History  Diagnosis Date  . Atrial tachycardia ([place])     s/p DCCV 09/2009, s/p RFCA 03/2011, s/p DCCV 04/2011, s/p RFCA 09/17/11  . Coronary artery disease     Status post CABG in 1994  . CLL (chronic lymphoblastic leukemia)     Stage 0-1  . Hypercholesterolemia     Well Controlled  . Hypertension   . Cough     Consistant with ACE inhibitor mediated cough  . Malaria 1972    Hx of  . Sleep-disordered breathing 06/20/2011  . History of tobacco abuse     quit 1994   Past Surgical History  Procedure Laterality Date  . Coronary artery bypass graft  1994    By Dr. Cyndia Bent. This included an LIMA graft to the left circumflex coronary, a sequential saphenous vein graft to the diagonal and the LAD, and a vein graft to the posterior descending and posterior lateral branches of the coronary artery.  . US echocardiography  09-07-09    Est EF 40-45%  . Cardioversion  06/03/2011    Procedure: CARDIOVERSION;  Surgeon: Deboraha Sprang, MD;  Location: [facility];  Service: Cardiovascular;  Laterality: N/A;  . Ep  study and ablation  2013    by Dr Caryl Comes, repeat ablation by Dr Rayann Heman  . Atrial flutter ablation N/A 04/11/2011    Procedure: ATRIAL FLUTTER ABLATION;  Surgeon: Deboraha Sprang, MD;  Location: [facility] CATH LAB;  Service: Cardiovascular;  Laterality: N/A;  . Electrophysiology study N/A 04/11/2011    Procedure: ELECTROPHYSIOLOGY STUDY;  Surgeon: Deboraha Sprang, MD;  Location: [facility] CATH LAB;  Service: Cardiovascular;  Laterality: N/A;  . Atrial flutter ablation N/A 09/17/2011    Procedure: ATRIAL FLUTTER ABLATION;  Surgeon: Thompson Grayer, MD;  Location: [facility] CATH LAB;  Service: Cardiovascular;  Laterality: N/A;   No family history on file. Social History  Substance Use Topics  . Smoking status: Former Smoker    Quit date: 01/22/1992  . Smokeless tobacco: None  . Alcohol Use: No    Review of Systems  All systems reviewed and negative, other than as noted in HPI.   Allergies  Penicillins; Ace inhibitors; Darvocet; and Sulfa antibiotics  Home Medications   Prior to Admission medications   Medication Sig Start Date End Date Taking? Authorizing Provider  dorzolamide (TRUSOPT) 2 % ophthalmic solution 1 DROP IN BOTH EYES TWICE A DAY 11/10/14   Historical Provider, MD  Fiber, Guar Gum, CHEW Chew 1 capsule by mouth daily.    Historical Provider, MD  latanoprost (XALATAN) 0.005 % ophthalmic solution Place 1 drop into both eyes at bedtime.  Historical Provider, MD  losartan (COZAAR) 50 MG tablet Take 1 tablet (50 mg total) by mouth daily. 07/22/14   [name] Martinique, MD  Magnesium Hydroxide (MAGNESIA PO) Take by mouth.    Historical Provider, MD  metoprolol succinate (TOPROL-XL) 25 MG 24 hr tablet Take 1 tablet (25 mg total) by mouth daily. 12/02/14   [name] Martinique, MD  Multiple Vitamin (MULTIVITAMIN) tablet Take 1 tablet by mouth daily.    Historical Provider, MD  niacin (NIASPAN) 750 MG CR tablet TAKE 2 TABLETS (1,500 MG TOTAL) AT BEDTIME 11/07/14   [name] Martinique, MD  pramipexole (MIRAPEX) 0.25 MG  tablet Take 1 tablet by mouth at bedtime. 08/15/14   Historical Provider, MD  RABEprazole (ACIPHEX) 20 MG tablet Take 1 tablet (20 mg total) by mouth daily. 01/28/12   [name] Martinique, MD  sildenafil (VIAGRA) 100 MG tablet Take 1 tablet (100 mg total) by mouth daily as needed for erectile dysfunction. 09/16/14   [name] Martinique, MD  traMADol (ULTRAM) 50 MG tablet Take 1 tablet (50 mg total) by mouth every 6 (six) hours as needed. 11/17/14   Nicholas Lose, MD  VENTOLIN HFA 108 (90 Base) MCG/ACT inhaler 2 PUFFS AS NEEDED EVERY 4 HRS INHALATION 12/09/14   Historical Provider, MD  VYTORIN 10-20 MG per tablet TAKE 1 TABLET AT BEDTIME 09/15/14   [name] Martinique, MD  XARELTO 20 MG TABS tablet TAKE 1 TABLET DAILY WITH SUPPER 02/14/15   [name] Martinique, MD   BP 110/66 mmHg  Pulse 112  Temp(Src) 98 F (36.7 C) (Oral)  Resp 18  Ht 5\' 10"  (1.778 m)  Wt 212 lb (96.163 kg)  BMI 30.42 kg/m2  SpO2 98% Physical Exam  Constitutional: He is oriented to person, place, and time. He appears well-developed and well-nourished. No distress.  HENT:  Head: Normocephalic and atraumatic.  Eyes: Conjunctivae are normal. Right eye exhibits no discharge. Left eye exhibits no discharge.  Neck: Neck supple.  Cardiovascular: Normal rate, regular rhythm and normal heart sounds.  Exam reveals no gallop and no friction rub.   No murmur heard. Pulmonary/Chest: Effort normal and breath sounds normal. No respiratory distress.  Abdominal: Soft. He exhibits no distension. There is no tenderness.  Musculoskeletal: He exhibits no edema or tenderness.  Lower extremities symmetric as compared to each other. No calf tenderness. Negative Homan's. No palpable cords.   Neurological: He is alert and oriented to person, place, and time.  Skin: Skin is warm and dry.  Psychiatric: He has a normal mood and affect. His behavior is normal. Thought content normal.  Nursing note and vitals reviewed.   ED Course  Procedures (including critical care  time) Labs Review Labs Reviewed  BASIC METABOLIC PANEL - Abnormal; Notable for the following:    Glucose, Bld 116 (*)    All other components within normal limits  CBC - Abnormal; Notable for the following:    WBC 22.6 (*)    Platelets 115 (*)    All other components within normal limits  TROPONIN I  BRAIN NATRIURETIC PEPTIDE  SEDIMENTATION RATE  C-REACTIVE PROTEIN  I-STAT TROPOININ, ED    Imaging Review Dg Chest 2 View  02/15/2015  CLINICAL DATA:  Chest heaviness since this morning, history coronary artery disease, hypertension, smoking EXAM: CHEST  2 VIEW COMPARISON:  12/09/2014 FINDINGS: Minimal enlargement of cardiac silhouette post CABG. Normal mediastinal contours and pulmonary vascularity. Chronic accentuation of RIGHT infrahilar markings appear stable. No acute infiltrate, pleural effusion or  pneumothorax. Scattered endplate spur formation thoracic spine. IMPRESSION: Minimal enlargement of cardiac silhouette post CABG. No acute abnormalities. Electronically Signed   By: Lavonia Dana M.D.   On: 02/15/2015 14:22   I have personally reviewed and evaluated these images and lab results as part of my medical decision-making.   EKG Interpretation   Date/Time:  Wednesday February 15 2015 13:47:28 EST Ventricular Rate:  111 PR Interval:  190 QRS Duration: 98 QT Interval:  300 QTC Calculation: 408 R Axis:   112 Text Interpretation:  Sinus tachycardia Left posterior fascicular block  Inferior infarct , age undetermined Anterior infarct , age undetermined ST  \T\ T wave abnormality, consider lateral ischemia Abnormal ECG Confirmed  by Wilson Singer  MD, Shawntae Lowy ([id number]) on 02/15/2015 4:12:22 PM      MDM   Final diagnoses:  Coronary artery disease involving native coronary artery of native heart without angina pectoris    70 year old male with what I suspect may be pericarditis. Pain in the center his chest which sometimes radiates straight through to his back. Reports that he feels  better when sitting up and symptoms exacerbated when laying supine. I cannot definitively appreciate a rub. Mild cardiomegaly on XR. I did not appreciate a significant pericardial effusion on my bedside ultrasound. His troponin is normal. Patient has a past history of CLL although he reports that he is not being actively treated for it at this time. He is also on Xarelto which would place him at higher risk for complications if this is indeed pericarditis. Consider ACS.  Has known CAD. Atypical symptoms though and normal troponin with constant symptoms since around 0400 this morning. Doubt PE. No specific respiratory complaints and reports compliance with xarelto.   Given a dose of ibuprofen. Also Protonix and Pepcid although I suspect that reflux is less likely. Will discuss with cardiology for their opinion as to whether they feel this is pericarditis and, if so, if he can appropriately be treated as an outpatient.    Virgel Manifold, MD 02/17/15 [phone]

## 2015-02-15 NOTE — ED Notes (Signed)
Pt from home for eval of sudden onset of substernal chest pressure at 0300 this morning that has been constant. Pt denies any n/v/d but reports sob that gets worse when he is laying down. Pt states hx of afib and extensive cardiac hx , denies taking any nitro at home or aspirin. On xarelto.

## 2015-02-15 NOTE — H&P (Signed)
Requesting MD: Dr. Wilson Singer (EDP) PCP: [name], MD Primary Cardiologist: Dr. Martinique -- last seen in August 2016  Chief Complaint: Chest Pain HPI: Rodney Burns is an 70 y.o. Male with a long history of coronary disease, ischemic cardiomyopathy after an MI back in 1994. He is status post CABG. His EF by last echo in 2013 was roughly 40-45%. He also has a history of recurrent atrial flutter with flutter ablation. He now has recurrent flutter and is on Xarelto anticoagulation He was in his usual state of health until he woke up this morning about 3:00 with a burning heaviness and it is lower sternal area. It was worse with deep inspiration and worse with lying down. He actually felt better stay sitting up than lying down. He describes occasionally feeling twinges of sharp poking sensation going toward his back with certain movements and deep inspiration. He has not otherwise been sick as far as any fevers or chills. He has had some hacking coughing ever since he was working with some sheet rock last week. He otherwise has not been sick with any GI above been nausea, vomiting, diarrhea etc.  He quickly notes that even though the timing of his MI in the past, he never had chest pressure was always left arm pain. He is not having any of that symptom. He did not note that the symptom was worse with exertion or any particular activity. He has not had any PND, orthopnea or edema.  Cardiovascular ROS: positive for - chest pain and irregular heartbeat negative for - dyspnea on exertion, edema, loss of consciousness, murmur, orthopnea, palpitations, paroxysmal nocturnal dyspnea, rapid heart rate, shortness of breath or Syncope/near syncope, TIA/amaurosis fugax  He had a nuclear stress test in March 2016 which showed an inferior and anteroapical infarct without evidence of ischemia. Ejection fraction was 32%.  Past Medical History  Diagnosis Date  . Atrial tachycardia ([place])     s/p DCCV 09/2009,  s/p RFCA 03/2011, s/p DCCV 04/2011, s/p RFCA 09/17/11  . Coronary artery disease involving native coronary artery without angina pectoris     Status post CABG in 1994:By Dr. Cyndia Bent. LIMA - L Cx, seqSVG-Diag-LAD, and SVG-PDA-PL  . CLL (chronic lymphoblastic leukemia)     Stage 0-1  . Hypercholesterolemia     Well Controlled  . Hypertension   . Cough     Consistant with ACE inhibitor mediated cough  . Malaria 1972    Hx of  . Sleep-disordered breathing 06/20/2011  . History of tobacco abuse     quit 1994    Past Surgical History  Procedure Laterality Date  . Coronary artery bypass graft  1994    By Dr. Cyndia Bent. LIMA - L Cx, seqSVG-Diag-LAD, and SVG-PDA-PL  . US echocardiography  09-07-09; 02/2011    a. Est EF 40-45%; b. EF 40-45%. Gr 2 DD. Mild LA dil  . Cardioversion  06/03/2011    Procedure: CARDIOVERSION;  Surgeon: Deboraha Sprang, MD;  Location: [facility];  Service: Cardiovascular;  Laterality: N/A;  . Ep study and ablation  2013    by Dr Caryl Comes, repeat ablation by Dr Rayann Heman  . Atrial flutter ablation N/A 04/11/2011    Procedure: ATRIAL FLUTTER ABLATION;  Surgeon: Deboraha Sprang, MD;  Location: [facility] CATH LAB;  Service: Cardiovascular;  Laterality: N/A;  . Electrophysiology study N/A 04/11/2011    Procedure: ELECTROPHYSIOLOGY STUDY;  Surgeon: Deboraha Sprang, MD;  Location: [facility] CATH LAB;  Service: Cardiovascular;  Laterality:  N/A;  . Atrial flutter ablation N/A 09/17/2011    Procedure: ATRIAL FLUTTER ABLATION;  Surgeon: Thompson Grayer, MD;  Location: [facility] CATH LAB;  Service: Cardiovascular;  Laterality: N/A;  . Nm myoview ltd  03/2014    scar in the inferior and anteroapical regions without ischemia. This is similar to finding on Myoview in 2013. EF is a little lower 39%>>32%.    No family history on file. Social History:  reports that he quit smoking about 23 years ago. He does not have any smokeless tobacco history on file. He reports that he does not drink alcohol. His drug history is not on  file.  Allergies:  Allergies  Allergen Reactions  . Penicillins Other (See Comments)    Put pt in hospital 50 yrs ago  . Ace Inhibitors Cough  . Darvocet [Propoxyphene N-Acetaminophen] Rash    Tolerates tylenol  . Sulfa Antibiotics Rash   Prior to Admission medications   Medication Sig Start Date End Date Taking? Authorizing Provider  dorzolamide (TRUSOPT) 2 % ophthalmic solution 1 DROP IN BOTH EYES TWICE A DAY 11/10/14  Yes Historical Provider, MD  Fiber, Guar Gum, CHEW Chew 1 capsule by mouth daily.   Yes Historical Provider, MD  latanoprost (XALATAN) 0.005 % ophthalmic solution Place 1 drop into both eyes at bedtime.    Yes Historical Provider, MD  losartan (COZAAR) 50 MG tablet Take 1 tablet (50 mg total) by mouth daily. 07/22/14  Yes [name] Martinique, MD  Magnesium 250 MG TABS Take 500 mg by mouth at bedtime.   Yes Historical Provider, MD  metoprolol succinate (TOPROL-XL) 25 MG 24 hr tablet Take 1 tablet (25 mg total) by mouth daily. 12/02/14  Yes [name] Martinique, MD  niacin (NIASPAN) 750 MG CR tablet TAKE 2 TABLETS (1,500 MG TOTAL) AT BEDTIME 11/07/14  Yes [name] Martinique, MD  pramipexole (MIRAPEX) 0.25 MG tablet Take 0.25 mg by mouth at bedtime.  08/15/14  Yes Historical Provider, MD  RABEprazole (ACIPHEX) 20 MG tablet Take 1 tablet (20 mg total) by mouth daily. 01/28/12  Yes [name] Martinique, MD  sildenafil (VIAGRA) 100 MG tablet Take 1 tablet (100 mg total) by mouth daily as needed for erectile dysfunction. 09/16/14  Yes [name] Martinique, MD  traMADol (ULTRAM) 50 MG tablet Take 1 tablet (50 mg total) by mouth every 6 (six) hours as needed. Patient taking differently: Take 100 mg by mouth 2 (two) times daily.  11/17/14  Yes Nicholas Lose, MD  VYTORIN 10-20 MG per tablet TAKE 1 TABLET AT BEDTIME 09/15/14  Yes [name] Martinique, MD  XARELTO 20 MG TABS tablet TAKE 1 TABLET DAILY WITH SUPPER 02/14/15  Yes [name] Martinique, MD  VENTOLIN HFA 108 [phone] Base) MCG/ACT inhaler 2 PUFFS AS NEEDED EVERY 4 HRS  INHALATION 12/09/14   Historical Provider, MD     (Not in a hospital admission)  Results for orders placed or performed during the hospital encounter of 02/15/15 (from the past 48 hour(s))  Basic metabolic panel     Status: Abnormal   Collection Time: 02/15/15  1:44 PM  Result Value Ref Range   Sodium 140 135 - 145 mmol/L   Potassium 4.6 3.5 - 5.1 mmol/L   Chloride 106 101 - 111 mmol/L   CO2 23 22 - 32 mmol/L   Glucose, Bld 116 (H) 65 - 99 mg/dL   BUN 16 6 - 20 mg/dL   Creatinine, Ser 0.87 0.61 - 1.24 mg/dL   Calcium 9.9  8.9 - 10.3 mg/dL   GFR calc non Af Amer >60 >60 mL/min   GFR calc Af Amer >60 >60 mL/min    Comment: (NOTE) The eGFR has been calculated using the CKD EPI equation. This calculation has not been validated in all clinical situations. eGFR's persistently <60 mL/min signify possible Chronic Kidney Disease.    Anion gap 11 5 - 15  CBC     Status: Abnormal   Collection Time: 02/15/15  1:44 PM  Result Value Ref Range   WBC 22.6 (H) 4.0 - 10.5 K/uL   RBC 5.39 4.22 - 5.81 MIL/uL   Hemoglobin 14.3 13.0 - 17.0 g/dL   HCT 46.1 39.0 - 52.0 %   MCV 85.5 78.0 - 100.0 fL   MCH 26.5 26.0 - 34.0 pg   MCHC 31.0 30.0 - 36.0 g/dL   RDW 14.3 11.5 - 15.5 %   Platelets 115 (L) 150 - 400 K/uL    Comment: PLATELET COUNT CONFIRMED BY SMEAR  I-stat troponin, ED (not at [facility], [facility])     Status: None   Collection Time: 02/15/15  1:57 PM  Result Value Ref Range   Troponin i, poc 0.01 0.00 - 0.08 ng/mL   Comment 3            Comment: Due to the release kinetics of cTnI, a negative result within the first hours of the onset of symptoms does not rule out myocardial infarction with certainty. If myocardial infarction is still suspected, repeat the test at appropriate intervals.   Sedimentation rate     Status: None   Collection Time: 02/15/15  5:29 PM  Result Value Ref Range   Sed Rate 4 0 - 16 mm/hr  C-reactive protein     Status: Abnormal   Collection Time: 02/15/15  5:29 PM   Result Value Ref Range   CRP 1.4 (H) <1.0 mg/dL  Troponin I     Status: None   Collection Time: 02/15/15  5:30 PM  Result Value Ref Range   Troponin I <0.03 <0.031 ng/mL    Comment:        NO INDICATION OF MYOCARDIAL INJURY.   Brain natriuretic peptide     Status: Abnormal   Collection Time: 02/15/15  5:31 PM  Result Value Ref Range   B Natriuretic Peptide 175.5 (H) 0.0 - 100.0 pg/mL   Dg Chest 2 View  02/15/2015  CLINICAL DATA:  Chest heaviness since this morning, history coronary artery disease, hypertension, smoking EXAM: CHEST  2 VIEW COMPARISON:  12/09/2014 FINDINGS: Minimal enlargement of cardiac silhouette post CABG. Normal mediastinal contours and pulmonary vascularity. Chronic accentuation of RIGHT infrahilar markings appear stable. No acute infiltrate, pleural effusion or pneumothorax. Scattered endplate spur formation thoracic spine. IMPRESSION: Minimal enlargement of cardiac silhouette post CABG. No acute abnormalities. Electronically Signed   By: Lavonia Dana M.D.   On: 02/15/2015 14:22   EKG: Likely atrial flutter, rate 92. PVCs were apparently conducted beats noted. Anteroseptal and inferior MI, age undetermined.   Review of Systems  Constitutional: Negative for fever and chills.  HENT: Negative for congestion and nosebleeds.   Eyes: Negative for blurred vision and double vision.       He has glaucoma, but no acute symptoms  Respiratory: Positive for cough (He has had a cough for couple days after having sheet rock). Negative for shortness of breath and wheezing.   Cardiovascular: Positive for chest pain. Negative for claudication, leg swelling and PND.  Per history of present illness  Gastrointestinal: Negative for blood in stool and melena.  Genitourinary: Negative for hematuria and flank pain.  Musculoskeletal: Negative.   Neurological: Negative.  Negative for dizziness, focal weakness, seizures, loss of consciousness, weakness and headaches.   Endo/Heme/Allergies: Bruises/bleeds easily.  Psychiatric/Behavioral: Negative.   All other systems reviewed and are negative.   Blood pressure 110/66, pulse 112, temperature 98 F (36.7 C), temperature source Oral, resp. rate 18, height '5\' 10"'  (1.778 m), weight 212 lb (96.163 kg), SpO2 98 %. Physical Exam  Constitutional: He appears well-developed and well-nourished. No distress.  HENT:  Head: Normocephalic and atraumatic.  Nose: Nose normal.  Mouth/Throat: Oropharynx is clear and moist. No oropharyngeal exudate.  Eyes: Conjunctivae and EOM are normal. Pupils are equal, round, and reactive to light. No scleral icterus.  Neck: Normal range of motion. Neck supple. No JVD present. No thyromegaly present.  Cardiovascular: Normal rate and normal pulses.  An irregular rhythm present. Frequent extrasystoles are present. PMI is not displaced.  Exam reveals friction rub (There is a soft sound during systole, but cannot say for sure that this is a friction rub.). Exam reveals no gallop.   No murmur heard. Respiratory: Effort normal and breath sounds normal. No respiratory distress. He has no wheezes. He has no rales. He exhibits no tenderness.  GI: Soft. Bowel sounds are normal. He exhibits no distension and no mass. There is no tenderness. There is no rebound and no guarding.  Does have mild splenomegaly from his CLL  Genitourinary:  Deferred  Musculoskeletal: Normal range of motion. He exhibits no edema or tenderness.  Lymphadenopathy:    He has no cervical adenopathy.  Neurological: He is alert. He has normal reflexes. No cranial nerve deficit. Coordination normal.  Skin: Skin is warm. No rash noted. He is not diaphoretic. No erythema. No pallor.  Psychiatric: He has a normal mood and affect. Judgment and thought content normal.     Assessment/Plan Mr. Maret has a long-standing cardiac history, but is symptoms on presentation sound very consistent with pericarditis. It is positional chest  pain is not like his angina. It is worse with lying down and better with sitting up. Worse with deep inspiration. There is a sharp component to it. Cannot say clearly that I hear a rub, however he has had a pericardiotomy with his CABG.  Principal Problem:   Pericarditis Active Problems:   Ischemic cardiomyopathy   Coronary artery disease involving native coronary artery without angina pectoris   Atrial flutter-atypical   Hypercholesterolemia   Essential hypertension  PLAN:   I will keep him overnight under observation status. Cardiac enzymes, however ACS is unlikely  Will check 2-D echocardiogram in the morning to exclude pericardial effusion.  We'll initiate therapy for empiric treatment of pericarditis with ibuprofen and colchicine  We'll do ibuprofen taper with 800, 600, 400, 200 mg - 3 times a day for 3 days each  Continue metoprolol for rate control and CAD  Continue home dose of losartan and Niaspan.  Continue Xarelto. Will need to ask for pharmacy advice using NSAIDs plus Xarelto.     [place], Qais Jowers W 02/15/2015, 6:45 PM

## 2015-02-15 NOTE — ED Notes (Signed)
Patient is not located in the room

## 2015-02-16 ENCOUNTER — Observation Stay (HOSPITAL_BASED_OUTPATIENT_CLINIC_OR_DEPARTMENT_OTHER): Payer: Medicare Other

## 2015-02-16 ENCOUNTER — Encounter (HOSPITAL_COMMUNITY): Payer: Self-pay | Admitting: Nurse Practitioner

## 2015-02-16 DIAGNOSIS — C9110 Chronic lymphocytic leukemia of B-cell type not having achieved remission: Secondary | ICD-10-CM | POA: Diagnosis not present

## 2015-02-16 DIAGNOSIS — I110 Hypertensive heart disease with heart failure: Secondary | ICD-10-CM | POA: Diagnosis not present

## 2015-02-16 DIAGNOSIS — I484 Atypical atrial flutter: Secondary | ICD-10-CM | POA: Diagnosis not present

## 2015-02-16 DIAGNOSIS — R161 Splenomegaly, not elsewhere classified: Secondary | ICD-10-CM | POA: Diagnosis not present

## 2015-02-16 DIAGNOSIS — R079 Chest pain, unspecified: Secondary | ICD-10-CM | POA: Diagnosis not present

## 2015-02-16 DIAGNOSIS — I5022 Chronic systolic (congestive) heart failure: Secondary | ICD-10-CM | POA: Diagnosis not present

## 2015-02-16 DIAGNOSIS — I2510 Atherosclerotic heart disease of native coronary artery without angina pectoris: Secondary | ICD-10-CM | POA: Diagnosis not present

## 2015-02-16 DIAGNOSIS — I319 Disease of pericardium, unspecified: Secondary | ICD-10-CM | POA: Diagnosis not present

## 2015-02-16 DIAGNOSIS — I255 Ischemic cardiomyopathy: Secondary | ICD-10-CM | POA: Diagnosis not present

## 2015-02-16 LAB — TROPONIN I: Troponin I: <0.03 ng/mL (ref <0.031)

## 2015-02-16 LAB — BASIC METABOLIC PANEL
ANION GAP: 5 (ref 5–15)
BUN: 13 mg/dL (ref 6–20)
CALCIUM: 9.1 mg/dL (ref 8.9–10.3)
CO2: 25 mmol/L (ref 22–32)
Chloride: 111 mmol/L (ref 101–111)
Creatinine, Ser: 1.02 mg/dL (ref 0.61–1.24)
GLUCOSE: 108 mg/dL — AB (ref 65–99)
Potassium: 4.7 mmol/L (ref 3.5–5.1)
SODIUM: 141 mmol/L (ref 135–145)

## 2015-02-16 LAB — SEDIMENTATION RATE: Sed Rate: 2 mm/hr (ref 0–16)

## 2015-02-16 MED ORDER — IBUPROFEN 200 MG PO TABS: ORAL_TABLET | ORAL | Status: DC

## 2015-02-16 MED ORDER — COLCHICINE 0.6 MG PO TABS: ORAL_TABLET | ORAL | Status: DC

## 2015-02-16 NOTE — Care Management (Signed)
Case Management Note Initial Note started By Tomi Bamberger RN, BSN Case Manager Patient Details  Name: Rodney Burns MRN: [id number] Date of Birth: Jul 31, 1945  Subjective/Objective: Date: 02/16/15 Spoke with patient at the bedside.  Introduced self as Tourist information centre manager and explained role in discharge planning and how to be reached.  Verified patient lives in [place] with spouse, patient is independent. Expressed potential need for no other DME.  Verified patient anticipates to go home with spouse,at time of discharge and will have full-time part-time supervision by family at this time to best of their knowledge. Patient denied needing help with their medication, states he has T[employer]are also and he usually just pays a co pay when he gets his medications, he will need colchicine at discharge. Patient goes to [facility] in [place] for his meds also.  Patient drives or is driven by spouse to MD appointments.  Verified patient has PCP Dr. Rex Kras with [facility] And his Cardiology MD is [name]. Benefits check in process and NCM will make patient aware when completed.   Plan: CM will continue to follow for discharge planning and [facility] resources.    Action/Plan:   Expected Discharge Date:     Expected Discharge Plan: Home/Self Care  In-House Referral:    Discharge planning Services CM Consult  Post Acute Care Choice:   Choice offered to:    DME Arranged:   DME Agency:    HH Arranged:   [place] Agency:    Status of Service: Completed, signed off  Medicare Important Message Given:   Date Medicare IM Given:   Medicare IM give by:   Date Additional Medicare IM Given:   Additional Medicare Important Message give by:    If discussed at [place] of Stay Meetings, dates discussed:   Additional Comments: 1409 02-16-15 Jacqlyn Krauss, RN,BSN [id number] Case Management Note  Patient Details  Name: Rodney Burns MRN:  [id number] Date of Birth: [birth year]  Subjective/Objective: Date: 02/16/15 Spoke with patient at the bedside.  Introduced self as Tourist information centre manager and explained role in discharge planning and how to be reached.  Verified patient lives in [place] with spouse, patient is independent. Expressed potential need for no other DME.  Verified patient anticipates to go home with spouse,at time of discharge and will have full-time part-time supervision by family at this time to best of their knowledge. Patient denied needing help with their medication, states he has Tricare also and he usually just pays a co pay when he gets his medications, he will need colchicine at discharge. Patient goes to [facility] in [place] for his meds also.  Patient drives or is driven by spouse to MD appointments.  Verified patient has PCP Dr. Rex Kras with [facility] And his Cardiology MD is [name]. Benefits check in process and NCM will make patient aware when completed.   Plan: CM will continue to follow for discharge planning and [facility] resources.    Action/Plan:   Expected Discharge Date:     Expected Discharge Plan: Home/Self Care  In-House Referral:    Discharge planning Services CM Consult  Post Acute Care Choice:   Choice offered to:    DME Arranged:   DME Agency:    HH Arranged:   [place] Agency:    Status of Service: Completed, signed off  Medicare Important Message Given:   Date Medicare IM Given:   Medicare IM give by:   Date Additional Medicare IM Given:   Additional Medicare Important Message give by:  If discussed at [place] of Stay Meetings, dates discussed:   Additional Comments:  1410 02-16-15 Jacqlyn Krauss, RN,BSN [phone] CM did receive benefits check for Colchicine and cost will be: Per rep at [employer]:  $24 for 30 day retail/ $20 for 90 day mail order, no auth required. No further needs at this time from CM.

## 2015-02-16 NOTE — Discharge Summary (Signed)
Discharge Summary   Patient ID: Rodney Burns,  MRN: [id number], DOB/AGE: 1945-10-11 70 y.o.  Admit date: 02/15/2015 Discharge date: 02/16/2015  Primary Care Provider: [name] Primary Cardiologist: P. Martinique, MD   Discharge Diagnoses    Principal Problem:   Pericarditis Active Problems:   Ischemic cardiomyopathy   Atrial flutter-atypical   Essential hypertension   Coronary artery disease involving native coronary artery without angina pectoris   Hypercholesterolemia   Chronic systolic CHF (congestive heart failure) (HCC)  Allergies Allergies  Allergen Reactions  . Penicillins Other (See Comments)    Put pt in hospital 50 yrs ago  . Ace Inhibitors Cough  . Darvocet [Propoxyphene N-Acetaminophen] Rash    Tolerates tylenol  . Sulfa Antibiotics Rash    Diagnostic Studies/Procedures    2D Echocardiogram 1.26.2017  Study Conclusions  - Left ventricle: The cavity size was mildly dilated. Posterior   wall thickness was increased in a pattern of mild LVH. Systolic   function was mildly to moderately reduced. The estimated ejection   fraction was in the range of 40% to 45%. Hypokinesis of the   anteroseptal and inferoseptal myocardium. Left ventricular   diastolic function parameters were normal. - Aortic valve: There was trivial regurgitation. - Mitral valve: Transvalvular velocity was within the normal range.   There was no evidence for stenosis. There was mild regurgitation. - Left atrium: The atrium was moderately dilated. - Right ventricle: The cavity size was normal. Wall thickness was   normal. Systolic function was normal. - Right atrium: The atrium was mildly dilated. - Poor sound transmission limits wall motion and LVEF assessment.   Cannot exclude anterior, apical, and lateral wall abnormalities.   Consider limited study with Definity constrast for more accuate   wall motion assessment if clinically indicated. _____________   History of Present  Illness  70 y/o male with a h/o CAD s/p CABG, ICM, Aflutter s/p RFCA, HTN, and HL.  He was in his usoh until the morning of 1/25, when he awoke at 3 am with a burning heaviness across his lower sternal area.  This was worse with deep breathing and lying down.  Symptoms persisted throughout the day and he presented to the [facility] ED on 1/25.  There, ECG was non-acute.  Trop was negative.  He was admitted for further eval.  Hospital Course   Consultants: Non   Pt was treated presumptively for pericarditis with ibuprofen, colchicine, and PPI.  With this, he had significant improvement in chest pain.  Troponins have remained normal.  Echo this AM showed stable, mild LV dysfunction, with an EF of 40-45%.  There was no evidence of a pericardial effusion.  He is being discharged home today in good condition on ibuprofen taper and colchicine (plan for 1 mo course).  He is on PPI chronically.  _____________  Discharge Vitals Blood pressure 120/70, pulse 72, temperature 97.9 F (36.6 C), temperature source Oral, resp. rate 18, height 5\' 10"  (1.778 m), weight 208 lb 14.4 oz (94.756 kg), SpO2 96 %.  Filed Weights   02/15/15 1347 02/15/15 2058 02/16/15 0435  Weight: 212 lb (96.163 kg) 210 lb 4.8 oz (95.391 kg) 208 lb 14.4 oz (94.756 kg)    Labs     CBC  Recent Labs  02/15/15 1344  WBC 22.6*  HGB 14.3  HCT 46.1  MCV 85.5  PLT [id number]   Basic Metabolic Panel  Recent Labs  02/15/15 1344 02/16/15 0507  NA 140 141  K 4.6 4.7  CL 106 111  CO2 23 25  GLUCOSE 116* 108*  BUN 16 13  CREATININE 0.87 1.02  CALCIUM 9.9 9.1   Disposition   Pt is being discharged home today in good condition.  Follow-up Plans & Appointments    Follow-up Information    Follow up with Erlene Quan, PA-C On 03/06/2015.   Specialties:  Cardiology, Radiology   Why:  1:30 PM   Contact information:   [address] [place] [address] [phone]       Discharge Medications     Medication  List    TAKE these medications        colchicine 0.6 MG tablet  1 Tab BID x 1 week and then 1 Tab daily.     dorzolamide 2 % ophthalmic solution  Commonly known as:  TRUSOPT  1 DROP IN BOTH EYES TWICE A DAY     Fiber (Guar Gum) Chew  Chew 1 capsule by mouth daily.     ibuprofen 200 MG tablet  Commonly known as:  ADVIL,MOTRIN  4 tabs TID x 3 days, then 3 tabs TID x 3 days, then 2 tabs TID x 3 days, then 1 tab TID x 3 days, then discontinue.     latanoprost 0.005 % ophthalmic solution  Commonly known as:  XALATAN  Place 1 drop into both eyes at bedtime.     losartan 50 MG tablet  Commonly known as:  COZAAR  Take 1 tablet (50 mg total) by mouth daily.     Magnesium 250 MG Tabs  Take 500 mg by mouth at bedtime.     metoprolol succinate 25 MG 24 hr tablet  Commonly known as:  TOPROL-XL  Take 1 tablet (25 mg total) by mouth daily.     niacin 750 MG CR tablet  Commonly known as:  NIASPAN  TAKE 2 TABLETS (1,500 MG TOTAL) AT BEDTIME     pramipexole 0.25 MG tablet  Commonly known as:  MIRAPEX  Take 0.25 mg by mouth at bedtime.     RABEprazole 20 MG tablet  Commonly known as:  ACIPHEX  Take 1 tablet (20 mg total) by mouth daily.     traMADol 50 MG tablet  Commonly known as:  ULTRAM  Take 1 tablet (50 mg total) by mouth every 6 (six) hours as needed.     VENTOLIN HFA 108 (90 Base) MCG/ACT inhaler  Generic drug:  albuterol  2 PUFFS AS NEEDED EVERY 4 HRS INHALATION     VIAGRA 100 MG tablet  Generic drug:  sildenafil  Take 1 tablet (100 mg total) by mouth daily as needed for erectile dysfunction.     VYTORIN 10-20 MG tablet  Generic drug:  ezetimibe-simvastatin  TAKE 1 TABLET AT BEDTIME     XARELTO 20 MG Tabs tablet  Generic drug:  rivaroxaban  TAKE 1 TABLET DAILY WITH SUPPER         Outstanding Labs/Studies   None  Duration of Discharge Encounter   Greater than 30 minutes including physician time.  Signed, Murray Hodgkins NP 02/16/2015, 3:15  PM

## 2015-02-16 NOTE — Discharge Instructions (Signed)
**PLEASE REMEMBER TO BRING ALL OF YOUR MEDICATIONS TO EACH OF YOUR FOLLOW-UP OFFICE VISITS.     [employer] Habits of Highly Healthy [place] wants to help you get well and stay well.  Live a longer, healthier life by practicing healthy habits every day.  1.  Visit your primary care provider regularly. 2.  Make time for family and friends.  Healthy relationships are important. 3.  Take medications as directed by your provider. 4.  Maintain a healthy weight and a trim waistline. 5.  Eat healthy meals and snacks, rich in fruits, vegetables, whole grains, and lean proteins. 6.  Get moving every day - aim for 150 minutes of moderate physical activity each week. 7.  Don't smoke. 8.  Avoid alcohol or drink in moderation.  Pericarditis Pericarditis is swelling (inflammation) of the pericardium. The pericardium is a thin, double-layered, fluid-filled tissue sac that surrounds the heart. The purpose of the pericardium is to contain the heart in the chest cavity and keep the heart from overexpanding. Different types of pericarditis can occur, such as:  Acute pericarditis. Inflammation can develop suddenly in acute pericarditis.  Chronic pericarditis. Inflammation develops gradually and is long-lasting in chronic pericarditis.  Constrictive pericarditis. In this type of pericarditis, the layers of the pericardium stiffen and develop scar tissue. The scar tissue thickens and sticks together. This makes it difficult for the heart to pump and work as it normally does. CAUSES  Pericarditis can be caused from different conditions, such as:  A bacterial, fungal or viral infection.  After a heart attack (myocardial infarction).  After open-heart surgery (coronary bypass graft surgery).  Auto-immune conditions such as lupus, rheumatoid arthritis or scleroderma.  Kidney failure.  Low thyroid condition (hypothyroidism).  Cancer from another part of the body that has spread (metastasized) to the  pericardium.  Chest injury or trauma.  After radiation treatment.  Certain medicines. SYMPTOMS  Symptoms of pericarditis can include:  Chest pain. Chest pain symptoms may increase when laying down and may be relieved when sitting up and leaning forward.  A chronic, dry cough.  Heart palpitations. These may feel like rapid, fluttering or pounding heart beats.  Chest pain may be worse when swallowing.  Dizziness or fainting.  Tiredness, fatigue or lethargy.  Fever. DIAGNOSIS  Pericarditis is diagnosed by the following:  A physical exam. A heart sound called a pericardial friction rub may be heard when your caregiver listens to your heart.  Blood work. Blood may be drawn to check for an infection and to look at your blood chemistry.  Electrocardiography. During electrocardiography your heart's electrical activity is monitored and recorded with a tracing on paper (electrocardiogram [ECG]).  Echocardiography.  Computed tomography (CT).  Magnetic resonance image (MRI). TREATMENT  To treat pericarditis, it is important to know the cause of it. The cause of pericarditis determines the treatment.   If the cause of pericarditis is due to an infection, treatment is based on the type of infection. If an infection is suspected in the pericardial fluid, a procedure called a pericardial fluid culture and biopsy may be done. This takes a sample of the pericardial fluid. The sample is sent to a lab which runs tests on the pericardial fluid to check for an infection.  If the autoimmune disease is the cause, treatment of the autoimmune condition will help improve the pericarditis.  If the cause of pericarditis is not known, anti-inflammatory medicines may be used to help decrease the inflammation.  Surgery may be  needed. The following are types of surgeries or procedures that may be done to treat pericarditis:  Pericardial window. A pericardial window makes a cut (incision) into the  pericardial sac. This allows excess fluid in the pericardium to drain.  Pericardiocentesis. A pericardiocentesis is also known as a pericardial tap. This procedure uses a needle that is guided by X-ray to drain (aspirate) excess fluid from the pericardium.  Pericardiectomy. A pericardiectomy removes part or all of the pericardium. HOME CARE INSTRUCTIONS   Do not smoke. If you smoke, quit. Your caregiver can help you quit smoking.  Maintain a healthy weight.  Follow an exercise program as directed by your health care provider. You may need to limit your exercising until your symptoms go away.  If you drink alcohol, do so in moderation.  Eat a heart healthy diet. A registered dietitian can help you learn about healthy food choices.  Keep a list of all your medicines with you at all times. Include the name, dose, how often it is taken and how it is taken. SEEK IMMEDIATE MEDICAL CARE IF:   You have chest pain or feelings of chest pressure.  You have sweating (diaphoresis) when at rest.  You have irregular heartbeats (palpitations).  You have rapid, racing heart beats.  You have unexplained fainting episodes.  You feel sick to your stomach (nausea) or vomiting without cause.  You have unexplained weakness. If you develop any of the symptoms which originally made you seek care, call for local emergency medical help. Do not drive yourself to the hospital.   This information is not intended to replace advice given to you by your health care provider. Make sure you discuss any questions you have with your health care provider.   Document Released: 07/03/2000 Document Revised: 05/24/2014 Document Reviewed: 07/20/2014 Elsevier Interactive Patient Education 2016 [place] stress through meditation or mindful relaxation. 10.  Get seven to nine hours of quality sleep each night.  Want more information on healthy habits?  To learn more about these and other healthy  habits, visit [email or web address]. _____________

## 2015-02-16 NOTE — Care Management Obs Status (Signed)
MEDICARE OBSERVATION STATUS NOTIFICATION   Patient Details  Name: Rodney Burns MRN: [id number] Date of Birth: 05/28/1945   Medicare Observation Status Notification Given:  Yes    Zenon Mayo, RN 02/16/2015, 11:55 AM

## 2015-02-16 NOTE — Progress Notes (Signed)
Pt discharged home. Discharge instructions have been gone over with the patient. IV's removed. Pt given unit number and told to call if they have any concerns regarding their discharge instructions.   

## 2015-02-16 NOTE — Progress Notes (Signed)
  Echocardiogram 2D Echocardiogram has been performed.  Jennette Dubin 02/16/2015, 11:27 AM

## 2015-02-16 NOTE — Care Management Note (Signed)
Case Management Note  Patient Details  Name: Rodney Burns MRN: [id number] Date of Birth: [birth year]  Subjective/Objective:   Date: 02/16/15 Spoke with patient at the bedside.  Introduced self as Tourist information centre manager and explained role in discharge planning and how to be reached.  Verified patient lives in [place] with spouse, patient is independent. Expressed potential need for no other DME.  Verified patient anticipates to go home with spouse,at time of discharge and will have full-time part-time supervision by family at this time to best of their knowledge. Patient denied needing help with their medication, states he has T[employer]are also and he usually just pays a co pay when he gets his medications, he will need colchicine at discharge. Patient goes to [facility] in [place] for his meds also.  Patient drives or is driven by spouse to MD appointments.  Verified patient has PCP Dr. Rex Kras with [facility]  And his Cardiology MD is [name]. Benefits check in process and NCM will make patient aware when completed.   Plan: CM will continue to follow for discharge planning and [facility] resources.                  Action/Plan:   Expected Discharge Date:                  Expected Discharge Plan:  Home/Self Care  In-House Referral:     Discharge planning Services  CM Consult  Post Acute Care Choice:    Choice offered to:     DME Arranged:    DME Agency:     HH Arranged:    [facility] Agency:     Status of Service:  Completed, signed off  Medicare Important Message Given:    Date Medicare IM Given:    Medicare IM give by:    Date Additional Medicare IM Given:    Additional Medicare Important Message give by:     If discussed at [place] of Stay Meetings, dates discussed:    Additional Comments:  Zenon Mayo, RN 02/16/2015, 11:47 AM

## 2015-02-16 NOTE — Progress Notes (Signed)
Patient Name: Rodney Burns Date of Encounter: 02/16/2015  Hospital Problem List     Principal Problem:   Pericarditis Active Problems:   Ischemic cardiomyopathy   Coronary artery disease involving native coronary artery without angina pectoris   Atrial flutter-atypical   Hypercholesterolemia   Essential hypertension    Subjective   Feels much better today. No active chest pain. No shortness of breath. Does not note his heart rate increased.  Inpatient Medications    . colchicine  0.6 mg Oral BID  . [START ON 02/23/2015] colchicine  0.6 mg Oral Daily  . dorzolamide  1 drop Both Eyes BID  . ezetimibe-simvastatin  1 tablet Oral QHS  . ibuprofen  800 mg Oral TID   Followed by  . [START ON 02/18/2015] ibuprofen  600 mg Oral TID   Followed by  . [START ON 02/21/2015] ibuprofen  400 mg Oral TID   Followed by  . [START ON 02/24/2015] ibuprofen  200 mg Oral TID  . latanoprost  1 drop Both Eyes QHS  . losartan  50 mg Oral Daily  . magnesium oxide  400 mg Oral QHS  . metoprolol succinate  25 mg Oral Daily  . niacin  1,500 mg Oral QHS  . pantoprazole  40 mg Oral Daily  . pramipexole  0.25 mg Oral QHS  . rivaroxaban  20 mg Oral Daily    Vital Signs    Filed Vitals:   02/15/15 1945 02/15/15 2058 02/16/15 0435 02/16/15 1425  BP: 114/67 126/81 118/75 120/70  Pulse: 73 74 75 72  Temp:  98.3 F (36.8 C) 97.9 F (36.6 C) 97.9 F (36.6 C)  TempSrc:  Oral Oral Oral  Resp: 18 18 18 18   Height:  5\' 10"  (1.778 m) 5\' 10"  (1.778 m)   Weight:  210 lb 4.8 oz (95.391 kg) 208 lb 14.4 oz (94.756 kg)   SpO2: 96% 97% 97% 96%    Intake/Output Summary (Last 24 hours) at 02/16/15 1439 Last data filed at 02/16/15 1300  Gross per 24 hour  Intake   1080 ml  Output      0 ml  Net   1080 ml   Filed Weights   02/15/15 1347 02/15/15 2058 02/16/15 0435  Weight: 212 lb (96.163 kg) 210 lb 4.8 oz (95.391 kg) 208 lb 14.4 oz (94.756 kg)    Physical Exam    General: [place], NAD. Neuro: Alert  and oriented X 3. Moves all extremities spontaneously. Psych: Normal affect. HEENT:  Normal  Neck: Supple without bruits or JVD. Lungs:  Resp regular and unlabored, CTA. Heart: Somewhat rapid and irregular, but not truly irregularly irregular. no s3, s4, or murmurs. No rub less frequent extrasystoles. Abdomen: Soft, non-tender, non-distended, BS + x 4.  Extremities: No clubbing, cyanosis or edema. DP/PT/Radials 2+ and equal bilaterally.  Labs    CBC  Recent Labs  02/15/15 1344  WBC 22.6*  HGB 14.3  HCT 46.1  MCV 85.5  PLT [id number]   Basic Metabolic Panel  Recent Labs  02/15/15 1344 02/16/15 0507  NA 140 141  K 4.6 4.7  CL 106 111  CO2 23 25  GLUCOSE 116* 108*  BUN 16 13  CREATININE 0.87 1.02  CALCIUM 9.9 9.1   Liver Function Tests No results for input(s): AST, ALT, ALKPHOS, BILITOT, PROT, ALBUMIN in the last 72 hours. No results for input(s): LIPASE, AMYLASE in the last 72 hours. Cardiac Enzymes  Recent Labs  02/15/15 1730 02/16/15 1006  TROPONINI <0.03 <0.03     Telemetry    Mostly looks like atrial flutter with variable block, however there is some intermittent neck so as to look like atrial fibrillation. Rates are anywhere from the 80s to 110s.  ECG/ ECHO     No current EKG 2-D echo: (Difficult study) Mildly dilated LV. Mild LVH. EF 40-45% with hypokinesis of anteroseptal and inferoseptal myocardium. Normal diastolic pressures. Mild MR. Mildly dilated LA. Normal RV size and function.  Radiology    NA  Assessment & Plan    Principal Problem:   Pericarditis Active Problems:   Ischemic cardiomyopathy   Coronary artery disease involving native coronary artery without angina pectoris   Atrial flutter-atypical   Hypercholesterolemia   Essential hypertension  Notable symptomatic relief after initiating treatment with NSAID and cold seen.  Sedimentation rate was not significantly elevated, no  Pleural effusion noted on echo, however what I thought  was a potential rub is now gone. His symptoms are totally free. Next section will continue ibuprofen taper for the complete 12 day taper as well as daily to see until quitting 1 month.  Otherwise stable from a cardiac standpoint. No active anginal symptoms. Mildly dyspneic with orthopnea but nothing out of the ordinary. He did not really notice heart rate being any fashion usual. No signs symptoms of heart failure.  For now agreed to simply continue his current medication regimen. No changes besides adding ibuprofen and culture seen for the scheduled time.  buprofen taper with 800, 600, 400, 200 mg - 3 times a day for 3 days each  Colchicine for 1 month total  As his echocardiogram is stable, he is not actively any more symptoms suggesting appropriate treatment of pericarditis, we will discharge the patient today. He will need follow-up with Dr. Martinique  PLAN D/C TODAY  Signed, Ellyn Hack, Leonie Green, M.D., M.S. Interventional Cardiologist   Pager # [phone] Phone # [phone] [address] [place], [place] [address]

## 2015-03-06 ENCOUNTER — Ambulatory Visit (INDEPENDENT_AMBULATORY_CARE_PROVIDER_SITE_OTHER): Payer: Medicare Other | Admitting: Cardiology

## 2015-03-06 ENCOUNTER — Encounter: Payer: Self-pay | Admitting: Cardiology

## 2015-03-06 VITALS — BP 122/72 | HR 88 | Ht 70.5 in | Wt 218.3 lb

## 2015-03-06 DIAGNOSIS — I4892 Unspecified atrial flutter: Secondary | ICD-10-CM

## 2015-03-06 DIAGNOSIS — Z7901 Long term (current) use of anticoagulants: Secondary | ICD-10-CM | POA: Diagnosis not present

## 2015-03-06 DIAGNOSIS — I319 Disease of pericardium, unspecified: Secondary | ICD-10-CM | POA: Diagnosis not present

## 2015-03-06 DIAGNOSIS — E785 Hyperlipidemia, unspecified: Secondary | ICD-10-CM

## 2015-03-06 DIAGNOSIS — I255 Ischemic cardiomyopathy: Secondary | ICD-10-CM

## 2015-03-06 DIAGNOSIS — Z951 Presence of aortocoronary bypass graft: Secondary | ICD-10-CM

## 2015-03-06 NOTE — Assessment & Plan Note (Signed)
Pt seen at [facility] ED 02/16/15 for chest pain felt to be pericarditis. This has resolved with NSAID and colchicine Rx.

## 2015-03-06 NOTE — Patient Instructions (Addendum)
Rodney Burns, [place], has made no changes to your current medications or treatment plan.  Rodney Burns recommends that you schedule a follow-up appointment in MARCH 2017.

## 2015-03-06 NOTE — Assessment & Plan Note (Signed)
Though recent hospital records don't reflect this, the patient has been on Xarelto without interruption.

## 2015-03-06 NOTE — Assessment & Plan Note (Signed)
S/P RFA 2011, and 2013 with recurrence of A flutter- plan is rate control and anticogulation

## 2015-03-06 NOTE — Assessment & Plan Note (Signed)
Followed by Dr Hulan Fess

## 2015-03-06 NOTE — Assessment & Plan Note (Signed)
Status post CABG in 1994:By Dr. Bartle. LIMA - L Cx, seqSVG-Diag-LAD, and SVG-PDA-PL Myoview low risk March 2016 

## 2015-03-06 NOTE — Progress Notes (Signed)
03/06/2015 Rodney Burns   1945/05/07  [id number]  Primary Physician [name], MD Primary Cardiologist: Dr Martinique  HPI:  70 y.o.male with a history of coronary disease, and ischemic cardiomyopathy after an MI back in 1994. He is status post CABG x 5. Myoview was low risk in March 2016. His EF by echo has been 40-45%. He has not had CHF. He also has a history of recurrent atrial flutter with flutter ablation in 2011 and 2013. He now has recurrent flutter and is on rate control and anticogulation with Xarelto.         He was in his usual state of health until he woke up the morning of 02/15/15 about 3:00am with a burning heaviness in hs lower sternal area. It was worse with deep inspiration and worse with lying down. He actually felt better sitting up than lying down. He describes occasionally feeling twinges of sharp poking sensation going toward his back with certain movements and deep inspiration. His Troponin were negative. He was placed on NSAIDs and colchicine. He says he felt better after the second dose of Ibuprofen. Echo done 02/16/15 showed an EF of 45% with no significant pericardial effusion. He is in the office today for follow up. He has done well since discharge, no further chest pain.    Current Outpatient Prescriptions  Medication Sig Dispense Refill  . colchicine 0.6 MG tablet 1 Tab BID x 1 week and then 1 Tab daily. 35 tablet 1  . dorzolamide (TRUSOPT) 2 % ophthalmic solution 1 DROP IN BOTH EYES TWICE A DAY  4  . Fiber, Guar Gum, CHEW Chew 1 capsule by mouth daily.    [name] ibuprofen (ADVIL,MOTRIN) 200 MG tablet 4 tabs TID x 3 days, then 3 tabs TID x 3 days, then 2 tabs TID x 3 days, then 1 tab TID x 3 days, then discontinue. 30 tablet 0  . latanoprost (XALATAN) 0.005 % ophthalmic solution Place 1 drop into both eyes at bedtime.     [name] losartan (COZAAR) 50 MG tablet Take 1 tablet (50 mg total) by mouth daily. 90 tablet 2  . Magnesium 250 MG TABS Take 500 mg by mouth at  bedtime.    . metoprolol succinate (TOPROL-XL) 25 MG 24 hr tablet Take 1 tablet (25 mg total) by mouth daily. 90 tablet 1  . niacin (NIASPAN) 750 MG CR tablet TAKE 2 TABLETS (1,500 MG TOTAL) AT BEDTIME 180 tablet 2  . pramipexole (MIRAPEX) 0.25 MG tablet Take 0.25 mg by mouth at bedtime.     . RABEprazole (ACIPHEX) 20 MG tablet Take 1 tablet (20 mg total) by mouth daily. 90 tablet 3  . sildenafil (VIAGRA) 100 MG tablet Take 1 tablet (100 mg total) by mouth daily as needed for erectile dysfunction. 18 tablet 3  . traMADol (ULTRAM) 50 MG tablet Take 1 tablet (50 mg total) by mouth every 6 (six) hours as needed. (Patient taking differently: Take 100 mg by mouth 2 (two) times daily. ) 120 tablet 3  . VENTOLIN HFA 108 (90 Base) MCG/ACT inhaler 2 PUFFS AS NEEDED EVERY 4 HRS INHALATION  0  . VYTORIN 10-20 MG per tablet TAKE 1 TABLET AT BEDTIME 90 tablet 2  . XARELTO 20 MG TABS tablet TAKE 1 TABLET DAILY WITH SUPPER 90 tablet 1   No current facility-administered medications for this visit.    Allergies  Allergen Reactions  . Penicillins Other (See Comments)    Put pt in hospital 50 yrs  ago  . Ace Inhibitors Cough  . Darvocet [Propoxyphene N-Acetaminophen] Rash    Tolerates tylenol  . Sulfa Antibiotics Rash    Social History   Social History  . Marital Status: Married    Spouse Name: N/A  . Number of Children: 2  . Years of Education: N/A   Occupational History  . Tree surgeon    Social History Main Topics  . Smoking status: Former Smoker    Quit date: 01/22/1992  . Smokeless tobacco: Not on file  . Alcohol Use: No  . Drug Use: Not on file  . Sexual Activity: Yes   Other Topics Concern  . Not on file   Social History Narrative     Review of Systems: General: negative for chills, fever, night sweats or weight changes.  Cardiovascular: negative for chest pain, dyspnea on exertion, edema, orthopnea, palpitations, paroxysmal nocturnal dyspnea or shortness of  breath Dermatological: negative for rash Respiratory: negative for cough or wheezing Urologic: negative for hematuria Abdominal: negative for nausea, vomiting, diarrhea, bright red blood per rectum, melena, or hematemesis Neurologic: negative for visual changes, syncope, or dizziness All other systems reviewed and are otherwise negative except as noted above.    Blood pressure 122/72, pulse 88, height 5' 10.5" (1.791 m), weight 218 lb 4.8 oz (99.02 kg).  General appearance: alert, cooperative and no distress Neck: no carotid bruit and no JVD Lungs: clear to auscultation bilaterally Heart: irregularly irregular rhythm Extremities: no edema Neurologic: Grossly normal   ASSESSMENT AND PLAN:   Pericarditis Pt seen at [facility] ED 02/16/15 for chest pain felt to be pericarditis. This has resolved with NSAID and colchicine Rx.  Hx of CABG x 5 '94 Status post CABG in 1994:By Dr. Cyndia Bent. LIMA - L Cx, seqSVG-Diag-LAD, and SVG-PDA-PL Myoview low risk March 2016  Cardiomyopathy, ischemic No CHF symptoms  Atrial flutter-atypical S/P RFA 2011, and 2013 with recurrence of A flutter- plan is rate control and anticogulation  Chronic anticoagulation Though recent hospital records don't reflect this, the patient has been on Xarelto without interruption.   Dyslipidemia Followed by Dr Hulan Fess    PLAN  Finish colchicine Rx (28 days). F/U with Dr Martinique in April as scheduled.   Kerin Ransom K PA-C 03/06/2015 1:48 PM

## 2015-03-06 NOTE — Assessment & Plan Note (Signed)
No CHF symptoms

## 2015-03-15 DIAGNOSIS — H401112 Primary open-angle glaucoma, right eye, moderate stage: Secondary | ICD-10-CM | POA: Diagnosis not present

## 2015-03-15 DIAGNOSIS — H40033 Anatomical narrow angle, bilateral: Secondary | ICD-10-CM | POA: Diagnosis not present

## 2015-03-15 DIAGNOSIS — H401121 Primary open-angle glaucoma, left eye, mild stage: Secondary | ICD-10-CM | POA: Diagnosis not present

## 2015-03-15 DIAGNOSIS — H35033 Hypertensive retinopathy, bilateral: Secondary | ICD-10-CM | POA: Diagnosis not present

## 2015-03-18 ENCOUNTER — Other Ambulatory Visit: Payer: Self-pay | Admitting: Cardiology

## 2015-03-20 NOTE — Telephone Encounter (Signed)
Rx(s) sent to pharmacy electronically.  

## 2015-05-18 ENCOUNTER — Encounter: Payer: Self-pay | Admitting: Cardiology

## 2015-05-18 ENCOUNTER — Ambulatory Visit (INDEPENDENT_AMBULATORY_CARE_PROVIDER_SITE_OTHER): Payer: Medicare Other | Admitting: Cardiology

## 2015-05-18 VITALS — BP 116/66 | HR 56 | Ht 70.0 in | Wt 217.2 lb

## 2015-05-18 DIAGNOSIS — I10 Essential (primary) hypertension: Secondary | ICD-10-CM

## 2015-05-18 DIAGNOSIS — Z7901 Long term (current) use of anticoagulants: Secondary | ICD-10-CM

## 2015-05-18 DIAGNOSIS — I4892 Unspecified atrial flutter: Secondary | ICD-10-CM

## 2015-05-18 DIAGNOSIS — I255 Ischemic cardiomyopathy: Secondary | ICD-10-CM

## 2015-05-18 DIAGNOSIS — Z951 Presence of aortocoronary bypass graft: Secondary | ICD-10-CM

## 2015-05-18 DIAGNOSIS — I319 Disease of pericardium, unspecified: Secondary | ICD-10-CM

## 2015-05-18 NOTE — Patient Instructions (Signed)
Continue your current therapy  I will see you in 6 months.   

## 2015-05-18 NOTE — Progress Notes (Signed)
Rodney Burns Date of Birth: 1945/07/01   History of Present Illness: Rodney Burns is seen for followup of atrial flutter and CAD. He has a history of atrial flutter and has had multiple cardioversions. He had radiofrequency ablation in March and then again in August of 2013. He had recurrent atrial flutter and has been treated with rate control and anticoagulation.  He has a history of coronary disease and is status post CABG in 1994. He had a nuclear stress test in March 2016 which showed an inferior and anteroapical infarct without evidence of ischemia. Ejection fraction was 32%.  He also has a history of CLL.  He was admitted in January 2017 with acute pleuritic chest pain. Clinically consistent with pericarditis although Ecg and Echo unremarkable. He did respond to anti-inflammatory therapy with ibuprofen and colchicine. No recurrent pain. These meds have been discontinued. Overall he feels very well today. No chest pain. Mild chronic SOB. No edema.   Current Outpatient Prescriptions on File Prior to Visit  Medication Sig Dispense Refill  . dorzolamide (TRUSOPT) 2 % ophthalmic solution 1 DROP IN BOTH EYES TWICE A DAY  4  . Fiber, Guar Gum, CHEW Chew 1 capsule by mouth daily.    [name] latanoprost (XALATAN) 0.005 % ophthalmic solution Place 1 drop into both eyes at bedtime.     [name] losartan (COZAAR) 50 MG tablet TAKE 1 TABLET DAILY 90 tablet 2  . Magnesium 250 MG TABS Take 500 mg by mouth at bedtime.    . metoprolol succinate (TOPROL-XL) 25 MG 24 hr tablet Take 1 tablet (25 mg total) by mouth daily. 90 tablet 1  . niacin (NIASPAN) 750 MG CR tablet TAKE 2 TABLETS (1,500 MG TOTAL) AT BEDTIME 180 tablet 2  . pramipexole (MIRAPEX) 0.25 MG tablet Take 0.25 mg by mouth at bedtime.     . RABEprazole (ACIPHEX) 20 MG tablet Take 1 tablet (20 mg total) by mouth daily. 90 tablet 3  . traMADol (ULTRAM) 50 MG tablet Take 1 tablet (50 mg total) by mouth every 6 (six) hours as needed. (Patient taking differently: Take 100  mg by mouth 2 (two) times daily. ) 120 tablet 3  . VYTORIN 10-20 MG per tablet TAKE 1 TABLET AT BEDTIME 90 tablet 2  . XARELTO 20 MG TABS tablet TAKE 1 TABLET DAILY WITH SUPPER 90 tablet 1   No current facility-administered medications on file prior to visit.    Allergies  Allergen Reactions  . Penicillins Other (See Comments)    Put pt in hospital 50 yrs ago  . Ace Inhibitors Cough  . Darvocet [Propoxyphene N-Acetaminophen] Rash    Tolerates tylenol  . Sulfa Antibiotics Rash    Past Medical History  Diagnosis Date  . Atrial tachycardia ([place])     a. s/p DCCV 09/2009; b. s/p RFCA 03/2011; c. s/p DCCV 04/2011; d. s/p RFCA 09/17/11.  . Coronary artery disease involving native coronary artery without angina pectoris     a. Status post CABG in 1994:By Dr. Cyndia Bent. LIMA - L Cx, seqSVG-Diag-LAD, and SVG-PDA-PL.  [name] CLL (chronic lymphoblastic leukemia)     Stage 0-1  . Hypercholesterolemia     Well Controlled  . Hypertension   . Cough     Consistant with ACE inhibitor mediated cough  . Malaria 1972    Hx of  . Sleep-disordered breathing 06/20/2011  . History of tobacco abuse     quit 1994  . Glaucoma (increased eye pressure) 1991  . Cataract   .  Chronic systolic CHF (congestive heart failure) ([place])     a. 01/2015 Echo: EF 40-45%, antsept/infsept HK, triv AI, mild MR, mod dil LA, nl RV, mildly dil RA.  . Ischemic cardiomyopathy     a. 02/2011 Echo: EF 40-45%;  b. 01/2015 Echo: EF 40-45%.  . Pericarditis     a. 01/2015-->Treated w/ ibuprofen/colchicine.    Past Surgical History  Procedure Laterality Date  . Coronary artery bypass graft  1994    By Dr. Cyndia Bent. LIMA - L Cx, seqSVG-Diag-LAD, and SVG-PDA-PL  . US echocardiography  09-07-09; 02/2011    a. Est EF 40-45%; b. EF 40-45%. Gr 2 DD. Mild LA dil  . Cardioversion  06/03/2011    Procedure: CARDIOVERSION;  Surgeon: Deboraha Sprang, MD;  Location: [facility];  Service: Cardiovascular;  Laterality: N/A;  . Ep study and ablation  2013    by Dr  Caryl Comes, repeat ablation by Dr Rayann Heman  . Atrial flutter ablation N/A 04/11/2011    Procedure: ATRIAL FLUTTER ABLATION;  Surgeon: Deboraha Sprang, MD;  Location: [facility] CATH LAB;  Service: Cardiovascular;  Laterality: N/A;  . Electrophysiology study N/A 04/11/2011    Procedure: ELECTROPHYSIOLOGY STUDY;  Surgeon: Deboraha Sprang, MD;  [facility] C[facility];  Service: Cardiovascular;  Laterality: N/A;  . Atrial flutter ablation N/A 09/17/2011    Procedure: ATRIAL FLUTTER ABLATION;  Surgeon: Thompson Grayer, MD;  Location: [facility] CATH LAB;  Service: Cardiovascular;  Laterality: N/A;  . Nm myoview ltd  03/2014    scar in the inferior and anteroapical regions without ischemia. This is similar to finding on Myoview in 2013. EF is a little lower 39%>>32%.    History  Smoking status  . Former Smoker  . Quit date: 01/22/1992  Smokeless tobacco  . Not on file    History  Alcohol Use No    History reviewed. No pertinent family history.  Review of Systems: As noted in history of present illness.  All other systems were reviewed and are negative.  Physical Exam: BP 116/66 mmHg  Pulse 56  Ht 5\' 10"  (1.778 m)  Wt 98.521 kg (217 lb 3.2 oz)  BMI 31.16 kg/m2 He is a pleasant white male in no acute distress. He is normocephalic, atraumatic. Pupils are equal round and reactive to light accommodation. Extraocular movements are full. Oropharynx is clear. Neck is supple without JVD, adenopathy, or megaly, or bruits. Lungs are clear. Cardiac exam reveals an irregular rate and rhythm without gallop, murmur, or click. No rub. Abdomen is obese, soft, nontender. He has good pedal pulses. He has no edema. He is alert and oriented x3. Cranial nerves II through XII are intact.  LABORATORY DATA: Lab Results  Component Value Date   WBC 22.6* 02/15/2015   HGB 14.3 02/15/2015   HCT 46.1 02/15/2015   PLT 115* 02/15/2015   GLUCOSE 108* 02/16/2015   ALT 29 02/02/2015   AST 24 02/02/2015   NA 141 02/16/2015   K 4.7 02/16/2015    CL 111 02/16/2015   CREATININE 1.02 02/16/2015   BUN 13 02/16/2015   CO2 25 02/16/2015   INR 1.5* 04/09/2011   Assessment / Plan: 1. Recurrent atrial flutter. Now status post radiofrequency ablation x2. Now chronic.  Continue metoprolol for rate control. Continue anticoagulation with Xarelto 20 mg daily.   2. Coronary disease status post CABG in 1994. Patient is asymptomatic. Continue  beta blocker therapy. Myvoview study in March showed inferior and anteroapical scar without ischemia. EF 32%. Findings similar  to prior Myoview in 2013.   3. Chronic congestive heart failure. Ejection fraction by Echo in January improved to 40-45%.  No evidence of volume overload. Continue ARB and beta blocker therapy.   4. CLL stage 0. Followed by oncology.  5. Hypertension, controlled.   6. Thrombocytopenia.  7. Acute pericarditis. Resolved. No recurrent symptoms.   followup in 6 months.

## 2015-05-19 ENCOUNTER — Other Ambulatory Visit: Payer: Self-pay

## 2015-05-19 DIAGNOSIS — C9110 Chronic lymphocytic leukemia of B-cell type not having achieved remission: Secondary | ICD-10-CM

## 2015-05-19 MED ORDER — TRAMADOL HCL 50 MG PO TABS: 50.0000 mg | ORAL_TABLET | Freq: Four times a day (QID) | ORAL | Status: DC | PRN

## 2015-05-25 DIAGNOSIS — H40033 Anatomical narrow angle, bilateral: Secondary | ICD-10-CM | POA: Diagnosis not present

## 2015-05-25 DIAGNOSIS — H401121 Primary open-angle glaucoma, left eye, mild stage: Secondary | ICD-10-CM | POA: Diagnosis not present

## 2015-05-25 DIAGNOSIS — H00026 Hordeolum internum left eye, unspecified eyelid: Secondary | ICD-10-CM | POA: Diagnosis not present

## 2015-05-25 DIAGNOSIS — H25012 Cortical age-related cataract, left eye: Secondary | ICD-10-CM | POA: Diagnosis not present

## 2015-05-25 DIAGNOSIS — H401112 Primary open-angle glaucoma, right eye, moderate stage: Secondary | ICD-10-CM | POA: Diagnosis not present

## 2015-05-25 DIAGNOSIS — H2512 Age-related nuclear cataract, left eye: Secondary | ICD-10-CM | POA: Diagnosis not present

## 2015-05-30 ENCOUNTER — Other Ambulatory Visit: Payer: Self-pay

## 2015-05-30 DIAGNOSIS — C9110 Chronic lymphocytic leukemia of B-cell type not having achieved remission: Secondary | ICD-10-CM

## 2015-05-30 MED ORDER — TRAMADOL HCL 50 MG PO TABS: 50.0000 mg | ORAL_TABLET | Freq: Four times a day (QID) | ORAL | Status: DC | PRN

## 2015-05-30 NOTE — Telephone Encounter (Signed)
Received call from pt regarding his tramadol prescription.  Pt states he has not received it from [facility] yet and was following up on it.  I contacted [facility] who said they never received the order which was documented as being faxed on 05/19/15.  I confirmed with [facility] the correct fax number and have sent the prescription to them.  Pt notified of chain of events and verbalized understanding.  No further questions or concerns at time of call.

## 2015-06-03 ENCOUNTER — Other Ambulatory Visit: Payer: Self-pay | Admitting: Cardiology

## 2015-06-05 NOTE — Telephone Encounter (Signed)
Rx request sent to pharmacy.  

## 2015-06-06 DIAGNOSIS — H2512 Age-related nuclear cataract, left eye: Secondary | ICD-10-CM | POA: Diagnosis not present

## 2015-06-20 ENCOUNTER — Other Ambulatory Visit: Payer: Self-pay | Admitting: Cardiology

## 2015-06-21 NOTE — Telephone Encounter (Signed)
Rx request sent to pharmacy.  

## 2015-07-06 DIAGNOSIS — H2511 Age-related nuclear cataract, right eye: Secondary | ICD-10-CM | POA: Diagnosis not present

## 2015-07-06 DIAGNOSIS — H25011 Cortical age-related cataract, right eye: Secondary | ICD-10-CM | POA: Diagnosis not present

## 2015-07-11 DIAGNOSIS — H2511 Age-related nuclear cataract, right eye: Secondary | ICD-10-CM | POA: Diagnosis not present

## 2015-08-03 ENCOUNTER — Ambulatory Visit (HOSPITAL_BASED_OUTPATIENT_CLINIC_OR_DEPARTMENT_OTHER): Payer: Medicare Other | Admitting: Hematology and Oncology

## 2015-08-03 ENCOUNTER — Other Ambulatory Visit (HOSPITAL_BASED_OUTPATIENT_CLINIC_OR_DEPARTMENT_OTHER): Payer: Medicare Other

## 2015-08-03 ENCOUNTER — Telehealth: Payer: Self-pay | Admitting: Hematology and Oncology

## 2015-08-03 ENCOUNTER — Encounter: Payer: Self-pay | Admitting: Hematology and Oncology

## 2015-08-03 VITALS — BP 124/74 | HR 70 | Temp 98.0°F | Resp 18 | Ht 70.0 in | Wt 214.7 lb

## 2015-08-03 DIAGNOSIS — C9110 Chronic lymphocytic leukemia of B-cell type not having achieved remission: Secondary | ICD-10-CM | POA: Diagnosis not present

## 2015-08-03 DIAGNOSIS — D696 Thrombocytopenia, unspecified: Secondary | ICD-10-CM | POA: Diagnosis not present

## 2015-08-03 LAB — COMPREHENSIVE METABOLIC PANEL
ALBUMIN: 4 g/dL (ref 3.5–5.0)
ALK PHOS: 53 U/L (ref 40–150)
ALT: 37 U/L (ref 0–55)
ANION GAP: 8 meq/L (ref 3–11)
AST: 28 U/L (ref 5–34)
BILIRUBIN TOTAL: 0.98 mg/dL (ref 0.20–1.20)
BUN: 15.2 mg/dL (ref 7.0–26.0)
CO2: 25 meq/L (ref 22–29)
CREATININE: 0.9 mg/dL (ref 0.7–1.3)
Calcium: 9.4 mg/dL (ref 8.4–10.4)
Chloride: 106 mEq/L (ref 98–109)
EGFR: 87 mL/min/{1.73_m2} — AB (ref >90)
GLUCOSE: 120 mg/dL (ref 70–140)
Potassium: 4.5 mEq/L (ref 3.5–5.1)
SODIUM: 139 meq/L (ref 136–145)
TOTAL PROTEIN: 6.6 g/dL (ref 6.4–8.3)

## 2015-08-03 LAB — CBC WITH DIFFERENTIAL/PLATELET
BASO%: 0.1 % (ref 0.0–2.0)
BASOS ABS: 0 10*3/uL (ref 0.0–0.1)
EOS ABS: 0.1 10*3/uL (ref 0.0–0.5)
EOS%: 0.5 % (ref 0.0–7.0)
HCT: 44.8 % (ref 38.4–49.9)
HEMOGLOBIN: 14 g/dL (ref 13.0–17.1)
LYMPH%: 80.1 % — AB (ref 14.0–49.0)
MCH: 26.2 pg — AB (ref 27.2–33.4)
MCHC: 31.3 g/dL — ABNORMAL LOW (ref 32.0–36.0)
MCV: 83.7 fL (ref 79.3–98.0)
MONO#: 0.5 10*3/uL (ref 0.1–0.9)
MONO%: 2.5 % (ref 0.0–14.0)
NEUT#: 3.5 10*3/uL (ref 1.5–6.5)
NEUT%: 16.8 % — ABNORMAL LOW (ref 39.0–75.0)
Platelets: 100 10*3/uL — ABNORMAL LOW (ref 140–400)
RBC: 5.35 10*6/uL (ref 4.20–5.82)
RDW: 15.5 % — ABNORMAL HIGH (ref 11.0–14.6)
WBC: 20.9 10*3/uL — ABNORMAL HIGH (ref 4.0–10.3)
lymph#: 16.8 10*3/uL — ABNORMAL HIGH (ref 0.9–3.3)

## 2015-08-03 LAB — LACTATE DEHYDROGENASE: LDH: 168 U/L (ref 125–245)

## 2015-08-03 LAB — TECHNOLOGIST REVIEW

## 2015-08-03 NOTE — Telephone Encounter (Signed)
appt made and avs printed °

## 2015-08-03 NOTE — Progress Notes (Signed)
Patient Care Team: Hulan Fess, MD as PCP - General (Family Medicine)  DIAGNOSIS: CLL stage 0  CHIEF COMPLIANT: follow-up of CLL  INTERVAL HISTORY: Rodney Burns is a 70 year old with above-mentioned history of CLL stage 0 is here for six-month follow-up. Reports no major problems of concern he denies any fevers chills night sweats or weight loss. He had recent eye procedures done and is using multiple eyedrops because he had eye inflammation. He is recovering very well from that.  REVIEW OF SYSTEMS:   Constitutional: Denies fevers, chills or abnormal weight loss Eyes: Denies blurriness of vision Ears, nose, mouth, throat, and face: Denies mucositis or sore throat Respiratory: Denies cough, dyspnea or wheezes Cardiovascular: Denies palpitation, chest discomfort Gastrointestinal:  Denies nausea, heartburn or change in bowel habits Skin: Denies abnormal skin rashes Lymphatics: Denies new lymphadenopathy or easy bruising Neurological:Denies numbness, tingling or new weaknesses Behavioral/Psych: Mood is stable, no new changes  Extremities: No lower extremity edema  All other systems were reviewed with the patient and are negative.  I have reviewed the past medical history, past surgical history, social history and family history with the patient and they are unchanged from previous note.  ALLERGIES:  is allergic to penicillins; ace inhibitors; darvocet; and sulfa antibiotics.  MEDICATIONS:  Current Outpatient Prescriptions  Medication Sig Dispense Refill  . dorzolamide (TRUSOPT) 2 % ophthalmic solution 1 DROP IN BOTH EYES TWICE A DAY  4  . Fiber, Guar Gum, CHEW Chew 1 capsule by mouth daily.    [name] latanoprost (XALATAN) 0.005 % ophthalmic solution Place 1 drop into both eyes at bedtime.     [name] losartan (COZAAR) 50 MG tablet TAKE 1 TABLET DAILY 90 tablet 2  . Magnesium 250 MG TABS Take 500 mg by mouth at bedtime.    . niacin (NIASPAN) 750 MG CR tablet TAKE 2 TABLETS (1,500 MG TOTAL)  AT BEDTIME 180 tablet 2  . pramipexole (MIRAPEX) 0.25 MG tablet Take 0.25 mg by mouth at bedtime.     . RABEprazole (ACIPHEX) 20 MG tablet Take 1 tablet (20 mg total) by mouth daily. 90 tablet 3  . TOPROL XL 25 MG 24 hr tablet TAKE 1 TABLET DAILY 90 tablet 1  . traMADol (ULTRAM) 50 MG tablet Take 1 tablet (50 mg total) by mouth every 6 (six) hours as needed. 120 tablet 3  . VYTORIN 10-20 MG tablet TAKE 1 TABLET AT BEDTIME 90 tablet 1  . XARELTO 20 MG TABS tablet TAKE 1 TABLET DAILY WITH SUPPER 90 tablet 1   No current facility-administered medications for this visit.    PHYSICAL EXAMINATION: ECOG PERFORMANCE STATUS: 0 - Asymptomatic  Filed Vitals:   08/03/15 1110  BP: 124/74  Pulse: 70  Temp: 98 F (36.7 C)  Resp: 18   Filed Weights   08/03/15 1110  Weight: 214 lb 11.2 oz (97.387 kg)    GENERAL:alert, no distress and comfortable SKIN: skin color, texture, turgor are normal, no rashes or significant lesions EYES: normal, Conjunctiva are pink and non-injected, sclera clear OROPHARYNX:no exudate, no erythema and lips, buccal mucosa, and tongue normal  NECK: supple, thyroid normal size, non-tender, without nodularity LYMPH:  no palpable lymphadenopathy in the cervical, axillary or inguinal LUNGS: clear to auscultation and percussion with normal breathing effort HEART: regular rate & rhythm and no murmurs and no lower extremity edema ABDOMEN:abdomen soft, non-tender and normal bowel sounds MUSCULOSKELETAL:no cyanosis of digits and no clubbing  NEURO: alert & oriented x 3 with fluent speech, no  focal motor/sensory deficits EXTREMITIES: No lower extremity edema   LABORATORY DATA:  I have reviewed the data as listed   Chemistry      Component Value Date/Time   NA 139 08/03/2015 1042   NA 141 02/16/2015 0507   K 4.5 08/03/2015 1042   K 4.7 02/16/2015 0507   CL 111 02/16/2015 0507   CL 105 01/02/2012 0938   CO2 25 08/03/2015 1042   CO2 25 02/16/2015 0507   BUN 15.2  08/03/2015 1042   BUN 13 02/16/2015 0507   CREATININE 0.9 08/03/2015 1042   CREATININE 1.02 02/16/2015 0507      Component Value Date/Time   CALCIUM 9.4 08/03/2015 1042   CALCIUM 9.1 02/16/2015 0507   ALKPHOS 53 08/03/2015 1042   ALKPHOS 41 09/12/2011 0824   AST 28 08/03/2015 1042   AST 23 09/12/2011 0824   ALT 37 08/03/2015 1042   ALT 30 09/12/2011 0824   BILITOT 0.98 08/03/2015 1042   BILITOT 0.9 09/12/2011 0824       Lab Results  Component Value Date   WBC 20.9* 08/03/2015   HGB 14.0 08/03/2015   HCT 44.8 08/03/2015   MCV 83.7 08/03/2015   PLT 100* 08/03/2015   NEUTROABS 3.5 08/03/2015     ASSESSMENT & PLAN:  CLL stage I, lymphocytosis and lymphadenopathy diagnosed 07/31/2009, initial CT scan revealed lymphadenopathy and mildly enlarged spleen.  Treatment plan: Observation. I discussed with him the indications for treatment would be rapid doubling time on development of severe anemia thrombocytopenia, rapidly enlarging lymphadenopathy, B symptoms like fevers chills night sweats and weight loss.  Chronic thrombocytopenia: I suspect this may be related to mild immune thrombocytopenia. Since his platelet counts are stable, there is no indication to treat. Blood work was reviewed. He does not have any change in his absolute lymphocyte count. He does not have any significant anemia.   Return to clinic in 6 months for follow-up with blood tests.  The patient has a good understanding of the overall plan. he agrees with it. he will call with any problems that may develop before the next visit here.   Rulon Eisenmenger, MD 08/03/2015

## 2015-08-10 ENCOUNTER — Ambulatory Visit: Payer: Medicare Other | Admitting: Hematology and Oncology

## 2015-08-10 ENCOUNTER — Other Ambulatory Visit: Payer: Medicare Other

## 2015-08-13 ENCOUNTER — Other Ambulatory Visit: Payer: Self-pay | Admitting: Cardiology

## 2015-08-24 ENCOUNTER — Ambulatory Visit (HOSPITAL_BASED_OUTPATIENT_CLINIC_OR_DEPARTMENT_OTHER): Payer: Medicare Other

## 2015-08-24 ENCOUNTER — Telehealth: Payer: Self-pay | Admitting: Hematology and Oncology

## 2015-08-24 ENCOUNTER — Other Ambulatory Visit: Payer: Self-pay | Admitting: *Deleted

## 2015-08-24 DIAGNOSIS — C9110 Chronic lymphocytic leukemia of B-cell type not having achieved remission: Secondary | ICD-10-CM

## 2015-08-24 DIAGNOSIS — N390 Urinary tract infection, site not specified: Secondary | ICD-10-CM

## 2015-08-24 LAB — URINALYSIS, MICROSCOPIC - CHCC
BILIRUBIN (URINE): NEGATIVE
BLOOD: NEGATIVE
Glucose: NEGATIVE mg/dL
Ketones: NEGATIVE mg/dL
Leukocyte Esterase: NEGATIVE
NITRITE: NEGATIVE
Protein: <30 mg/dL
RBC / HPF: NEGATIVE (ref 0–2)
SPECIFIC GRAVITY, URINE: 1.01 (ref 1.003–1.035)
UROBILINOGEN UR: 0.2 mg/dL (ref 0.2–1)
pH: 7 (ref 4.6–8.0)

## 2015-08-24 NOTE — Telephone Encounter (Signed)
Call the patient informed the result of the urine analysis. There is no evidence of UTI and there is no evidence of blood so I do not believe there is any stone issues. He will be in touch with his primary care doctor regarding his abdominal symptoms.

## 2015-08-24 NOTE — Telephone Encounter (Signed)
per pof to sch pt appt-sent back to lab °

## 2015-08-26 LAB — URINE CULTURE

## 2015-08-28 ENCOUNTER — Other Ambulatory Visit: Payer: Self-pay | Admitting: Cardiology

## 2015-08-28 NOTE — Telephone Encounter (Signed)
Rx(s) sent to pharmacy electronically.  

## 2015-09-22 DIAGNOSIS — M545 Low back pain: Secondary | ICD-10-CM | POA: Diagnosis not present

## 2015-09-22 DIAGNOSIS — R109 Unspecified abdominal pain: Secondary | ICD-10-CM | POA: Diagnosis not present

## 2015-09-26 DIAGNOSIS — N281 Cyst of kidney, acquired: Secondary | ICD-10-CM | POA: Diagnosis not present

## 2015-09-26 DIAGNOSIS — R161 Splenomegaly, not elsewhere classified: Secondary | ICD-10-CM | POA: Diagnosis not present

## 2015-09-26 DIAGNOSIS — R109 Unspecified abdominal pain: Secondary | ICD-10-CM | POA: Diagnosis not present

## 2015-09-26 DIAGNOSIS — N400 Enlarged prostate without lower urinary tract symptoms: Secondary | ICD-10-CM | POA: Diagnosis not present

## 2015-09-26 DIAGNOSIS — Z856 Personal history of leukemia: Secondary | ICD-10-CM | POA: Diagnosis not present

## 2015-10-26 ENCOUNTER — Other Ambulatory Visit: Payer: Self-pay | Admitting: Hematology and Oncology

## 2015-10-26 DIAGNOSIS — C9110 Chronic lymphocytic leukemia of B-cell type not having achieved remission: Secondary | ICD-10-CM

## 2015-10-26 MED ORDER — TRAMADOL HCL 50 MG PO TABS: 50.0000 mg | ORAL_TABLET | Freq: Four times a day (QID) | ORAL | 3 refills | Status: DC | PRN

## 2015-11-12 ENCOUNTER — Other Ambulatory Visit: Payer: Self-pay | Admitting: Cardiology

## 2015-11-23 DIAGNOSIS — Z23 Encounter for immunization: Secondary | ICD-10-CM | POA: Diagnosis not present

## 2015-12-02 ENCOUNTER — Other Ambulatory Visit: Payer: Self-pay | Admitting: Cardiology

## 2015-12-05 NOTE — Telephone Encounter (Signed)
Rx(s) sent to pharmacy electronically.  

## 2015-12-06 NOTE — Progress Notes (Signed)
Rodney Burns Date of Birth: Sep 23, 1945   History of Present Illness: Rodney Burns is seen for followup of atrial flutter and CAD. He has a history of atrial flutter and has had multiple cardioversions. He had radiofrequency ablation in March and then again in August of 2013. He had recurrent atrial flutter and has been treated with rate control and anticoagulation.  He has a history of coronary disease and is status post CABG in 1994. He had a nuclear stress test in March 2016 which showed an inferior and anteroapical infarct without evidence of ischemia. Ejection fraction was 32%.  He also has a history of CLL.  He was admitted in January 2017 with acute pleuritic chest pain. Clinically consistent with pericarditis although Ecg and Echo unremarkable. He did respond to anti-inflammatory therapy with ibuprofen and colchicine. No recurrent pain. These meds have been discontinued.   On follow up  he feels very well today. No chest pain. Mild chronic SOB. No edema. No palpitations or dizziness. Active playing golf and bowling.  Current Outpatient Prescriptions on File Prior to Visit  Medication Sig Dispense Refill  . dorzolamide (TRUSOPT) 2 % ophthalmic solution 1 DROP IN BOTH EYES TWICE A DAY  4  . Fiber, Guar Gum, CHEW Chew 1 capsule by mouth daily.    [name] latanoprost (XALATAN) 0.005 % ophthalmic solution Place 1 drop into both eyes at bedtime.     [name] losartan (COZAAR) 50 MG tablet TAKE 1 TABLET DAILY 90 tablet 2  . Magnesium 250 MG TABS Take 500 mg by mouth at bedtime.    . niacin (NIASPAN) 750 MG CR tablet Take 2 tablets (1,500 mg total) by mouth at bedtime. 180 tablet 2  . pramipexole (MIRAPEX) 0.25 MG tablet Take 0.25 mg by mouth at bedtime.     . RABEprazole (ACIPHEX) 20 MG tablet Take 1 tablet (20 mg total) by mouth daily. 90 tablet 3  . TOPROL XL 25 MG 24 hr tablet TAKE 1 TABLET DAILY 90 tablet 2  . traMADol (ULTRAM) 50 MG tablet Take 1 tablet (50 mg total) by mouth every 6 (six) hours as needed. 120  tablet 3  . VYTORIN 10-20 MG tablet TAKE 1 TABLET AT BEDTIME 90 tablet 1  . XARELTO 20 MG TABS tablet TAKE 1 TABLET DAILY WITH SUPPER 90 tablet 0   No current facility-administered medications on file prior to visit.     Allergies  Allergen Reactions  . Penicillins Other (See Comments)    Put pt in hospital 50 yrs ago  . Ace Inhibitors Cough  . Darvocet [Propoxyphene N-Acetaminophen] Rash    Tolerates tylenol  . Sulfa Antibiotics Rash    Past Medical History:  Diagnosis Date  . Atrial tachycardia ([place])    a. s/p DCCV 09/2009; b. s/p RFCA 03/2011; c. s/p DCCV 04/2011; d. s/p RFCA 09/17/11.  . Cataract   . Chronic systolic CHF (congestive heart failure) ([place])    a. 01/2015 Echo: EF 40-45%, antsept/infsept HK, triv AI, mild MR, mod dil LA, nl RV, mildly dil RA.  [name] CLL (chronic lymphoblastic leukemia)    Stage 0-1  . Coronary artery disease involving native coronary artery without angina pectoris    a. Status post CABG in 1994:By Dr. Cyndia Bent. LIMA - L Cx, seqSVG-Diag-LAD, and SVG-PDA-PL.  [name] Cough    Consistant with ACE inhibitor mediated cough  . Glaucoma (increased eye pressure) 1991  . History of tobacco abuse    quit 1994  . Hypercholesterolemia  Well Controlled  . Hypertension   . Ischemic cardiomyopathy    a. 02/2011 Echo: EF 40-45%;  b. 01/2015 Echo: EF 40-45%.  . Malaria 1972   Hx of  . Pericarditis    a. 01/2015-->Treated w/ ibuprofen/colchicine.  . Sleep-disordered breathing 06/20/2011    Past Surgical History:  Procedure Laterality Date  . ATRIAL FLUTTER ABLATION N/A 04/11/2011   Procedure: ATRIAL FLUTTER ABLATION;  Surgeon: Deboraha Sprang, MD;  Location: [facility] CATH LAB;  Service: Cardiovascular;  Laterality: N/A;  . ATRIAL FLUTTER ABLATION N/A 09/17/2011   Procedure: ATRIAL FLUTTER ABLATION;  Surgeon: Thompson Grayer, MD;  Location: [facility] CATH LAB;  Service: Cardiovascular;  Laterality: N/A;  . CARDIOVERSION  06/03/2011   Procedure: CARDIOVERSION;  Surgeon: Deboraha Sprang, MD;   Location: [facility];  Service: Cardiovascular;  Laterality: N/A;  . [place]   By Dr. Cyndia Bent. LIMA - L Cx, seqSVG-Diag-LAD, and SVG-PDA-PL  . ELECTROPHYSIOLOGY STUDY N/A 04/11/2011   Procedure: ELECTROPHYSIOLOGY STUDY;  Surgeon: Deboraha Sprang, MD;  Location[facility] CATH LAB;  Service: Cardiovascular;  Laterality: N/A;  . EP study and ablation  2013   by Dr Caryl Comes, repeat ablation by Dr Rayann Heman  . NM MYOVIEW LTD  03/2014   scar in the inferior and anteroapical regions without ischemia. This is similar to finding on Myoview in 2013. EF is a little lower 39%>>32%.  . US ECHOCARDIOGRAPHY  09-07-09; 02/2011   a. Est EF 40-45%; b. EF 40-45%. Gr 2 DD. Mild LA dil    History  Smoking Status  . Former Smoker  . Quit date: 01/22/1992  Smokeless Tobacco  . Not on file    History  Alcohol Use No    History reviewed. No pertinent family history.  Review of Systems: As noted in history of present illness.  All other systems were reviewed and are negative.  Physical Exam: BP 120/68   Pulse 70   Ht 5' 10.5" (1.791 m)   Wt 219 lb (99.3 kg)   BMI 30.98 kg/m  He is a pleasant white male in no acute distress. He is normocephalic, atraumatic. Pupils are equal round and reactive to light accommodation. Extraocular movements are full. Oropharynx is clear. Neck is supple without JVD, adenopathy, or megaly, or bruits. Lungs are clear. Cardiac exam reveals an irregular rate and rhythm without gallop, murmur, or click. No rub. Abdomen is obese, soft, nontender. He has good pedal pulses. He has no edema. He is alert and oriented x3. Cranial nerves II through XII are intact.  LABORATORY DATA: Lab Results  Component Value Date   WBC 20.9 (H) 08/03/2015   HGB 14.0 08/03/2015   HCT 44.8 08/03/2015   PLT 100 (L) 08/03/2015   GLUCOSE 120 08/03/2015   ALT 37 08/03/2015   AST 28 08/03/2015   NA 139 08/03/2015   K 4.5 08/03/2015   CL 111 02/16/2015   CREATININE 0.9 08/03/2015   BUN 15.2  08/03/2015   CO2 25 08/03/2015   INR 1.5 (H) 04/09/2011   Assessment / Plan: 1. Recurrent atrial flutter. Now status post radiofrequency ablation x2. Now chronic.  Continue metoprolol for rate control. Continue anticoagulation with Xarelto 20 mg daily.   2. Coronary disease status post CABG in 1994. Patient is asymptomatic. Continue  beta blocker therapy. Myvoview study in March 2016 showed inferior and anteroapical scar without ischemia. EF 32%. Findings similar to prior Myoview in 2013.   3. Chronic congestive heart failure. Ejection fraction by  Echo in January improved to 40-45%.  No evidence of volume overload. Continue ARB and beta blocker therapy.   4. CLL stage 0. Followed by oncology.  5. Hypertension, controlled.   6. Thrombocytopenia.  7. Hyperlipidemia. On Vytorin and niacin. 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 No recent lipid panel. Recommend he has his lipids checked with next lab work at [facility] [place].  followup in 6 months.

## 2015-12-07 ENCOUNTER — Ambulatory Visit (INDEPENDENT_AMBULATORY_CARE_PROVIDER_SITE_OTHER): Payer: Medicare Other | Admitting: Cardiology

## 2015-12-07 ENCOUNTER — Encounter: Payer: Self-pay | Admitting: Cardiology

## 2015-12-07 VITALS — BP 120/68 | HR 70 | Ht 70.5 in | Wt 219.0 lb

## 2015-12-07 DIAGNOSIS — I25810 Atherosclerosis of coronary artery bypass graft(s) without angina pectoris: Secondary | ICD-10-CM

## 2015-12-07 DIAGNOSIS — Z951 Presence of aortocoronary bypass graft: Secondary | ICD-10-CM

## 2015-12-07 DIAGNOSIS — I4892 Unspecified atrial flutter: Secondary | ICD-10-CM

## 2015-12-07 DIAGNOSIS — Z7901 Long term (current) use of anticoagulants: Secondary | ICD-10-CM | POA: Diagnosis not present

## 2015-12-07 DIAGNOSIS — I10 Essential (primary) hypertension: Secondary | ICD-10-CM | POA: Diagnosis not present

## 2015-12-07 DIAGNOSIS — E785 Hyperlipidemia, unspecified: Secondary | ICD-10-CM

## 2015-12-07 DIAGNOSIS — I255 Ischemic cardiomyopathy: Secondary | ICD-10-CM | POA: Diagnosis not present

## 2015-12-07 NOTE — Patient Instructions (Signed)
Continue your current therapy  Get your lipids checked with your next blood work  I will see you in 6 months.

## 2015-12-18 ENCOUNTER — Other Ambulatory Visit: Payer: Self-pay | Admitting: Cardiology

## 2015-12-18 NOTE — Telephone Encounter (Signed)
REFILL 

## 2015-12-21 DIAGNOSIS — H401121 Primary open-angle glaucoma, left eye, mild stage: Secondary | ICD-10-CM | POA: Diagnosis not present

## 2015-12-21 DIAGNOSIS — H401112 Primary open-angle glaucoma, right eye, moderate stage: Secondary | ICD-10-CM | POA: Diagnosis not present

## 2015-12-21 DIAGNOSIS — H40033 Anatomical narrow angle, bilateral: Secondary | ICD-10-CM | POA: Diagnosis not present

## 2015-12-28 DIAGNOSIS — E785 Hyperlipidemia, unspecified: Secondary | ICD-10-CM | POA: Diagnosis not present

## 2016-01-03 ENCOUNTER — Other Ambulatory Visit: Payer: Self-pay | Admitting: Cardiology

## 2016-01-04 NOTE — Telephone Encounter (Signed)
Rx(s) sent to pharmacy electronically.  

## 2016-02-01 ENCOUNTER — Other Ambulatory Visit: Payer: Medicare Other

## 2016-02-01 ENCOUNTER — Ambulatory Visit: Payer: Medicare Other | Admitting: Hematology and Oncology

## 2016-02-11 ENCOUNTER — Other Ambulatory Visit: Payer: Self-pay | Admitting: Cardiology

## 2016-02-15 ENCOUNTER — Other Ambulatory Visit (HOSPITAL_BASED_OUTPATIENT_CLINIC_OR_DEPARTMENT_OTHER): Payer: Medicare Other

## 2016-02-15 ENCOUNTER — Ambulatory Visit (HOSPITAL_BASED_OUTPATIENT_CLINIC_OR_DEPARTMENT_OTHER): Payer: Medicare Other | Admitting: Hematology and Oncology

## 2016-02-15 DIAGNOSIS — C9110 Chronic lymphocytic leukemia of B-cell type not having achieved remission: Secondary | ICD-10-CM | POA: Diagnosis not present

## 2016-02-15 DIAGNOSIS — D696 Thrombocytopenia, unspecified: Secondary | ICD-10-CM | POA: Diagnosis not present

## 2016-02-15 LAB — CBC WITH DIFFERENTIAL/PLATELET
BASO%: 0.1 % (ref 0.0–2.0)
Basophils Absolute: 0 10*3/uL (ref 0.0–0.1)
EOS ABS: 0.1 10*3/uL (ref 0.0–0.5)
EOS%: 0.4 % (ref 0.0–7.0)
HEMATOCRIT: 45.1 % (ref 38.4–49.9)
HEMOGLOBIN: 14.5 g/dL (ref 13.0–17.1)
LYMPH#: 20.7 10*3/uL — AB (ref 0.9–3.3)
LYMPH%: 80.9 % — AB (ref 14.0–49.0)
MCH: 27.6 pg (ref 27.2–33.4)
MCHC: 32.2 g/dL (ref 32.0–36.0)
MCV: 85.7 fL (ref 79.3–98.0)
MONO#: 0.7 10*3/uL (ref 0.1–0.9)
MONO%: 2.9 % (ref 0.0–14.0)
NEUT%: 15.7 % — ABNORMAL LOW (ref 39.0–75.0)
NEUTROS ABS: 4 10*3/uL (ref 1.5–6.5)
PLATELETS: 106 10*3/uL — AB (ref 140–400)
RBC: 5.26 10*6/uL (ref 4.20–5.82)
RDW: 13.8 % (ref 11.0–14.6)
WBC: 25.6 10*3/uL — AB (ref 4.0–10.3)

## 2016-02-15 LAB — TECHNOLOGIST REVIEW

## 2016-02-15 LAB — COMPREHENSIVE METABOLIC PANEL
ALBUMIN: 4.2 g/dL (ref 3.5–5.0)
ALK PHOS: 52 U/L (ref 40–150)
ALT: 41 U/L (ref 0–55)
ANION GAP: 6 meq/L (ref 3–11)
AST: 28 U/L (ref 5–34)
BILIRUBIN TOTAL: 1.14 mg/dL (ref 0.20–1.20)
BUN: 11.5 mg/dL (ref 7.0–26.0)
CO2: 25 mEq/L (ref 22–29)
CREATININE: 0.9 mg/dL (ref 0.7–1.3)
Calcium: 9.5 mg/dL (ref 8.4–10.4)
Chloride: 106 mEq/L (ref 98–109)
EGFR: 86 mL/min/{1.73_m2} — AB (ref >90)
Glucose: 136 mg/dl (ref 70–140)
Potassium: 4.6 mEq/L (ref 3.5–5.1)
Sodium: 138 mEq/L (ref 136–145)
TOTAL PROTEIN: 6.5 g/dL (ref 6.4–8.3)

## 2016-02-15 LAB — LACTATE DEHYDROGENASE: LDH: 186 U/L (ref 125–245)

## 2016-02-15 MED ORDER — TRAMADOL HCL 50 MG PO TABS: 50.0000 mg | ORAL_TABLET | Freq: Four times a day (QID) | ORAL | 3 refills | Status: DC | PRN

## 2016-02-15 NOTE — Assessment & Plan Note (Signed)
CLL stage I, lymphocytosis and lymphadenopathy diagnosed 07/31/2009, initial CT scan revealed lymphadenopathy and mildly enlarged spleen.  Treatment plan: Observation. I discussed with him the indications for treatment would be rapid doubling time on development of severe anemia thrombocytopenia, rapidly enlarging lymphadenopathy, B symptoms like fevers chills night sweats and weight loss.  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 Since his platelet counts are stable, there is no indication to treat. Blood work was reviewed.  He does not have any change in his absolute lymphocyte count.  He does not have any significant anemia.   Return to clinic in 6 months for follow-up with blood tests.  

## 2016-02-15 NOTE — Progress Notes (Signed)
Patient Care Team: Hulan Fess, MD as PCP - General (Family Medicine)  DIAGNOSIS:  Encounter Diagnosis  Name Primary?  . CLL (chronic lymphocytic leukemia) ([place])    CHIEF COMPLIANT: Follow-up of CLL, complains of left hand pain  INTERVAL HISTORY: Rodney Burns is a 71 year old with above-mentioned history of CLL who appears to be stable from CLL standpoint. He complains of pain in the left hand for the past several weeks. He also feels tendon inflammation and nodules. He denies any fevers chills night sweats or weight loss.  REVIEW OF SYSTEMS:   Constitutional: Denies fevers, chills or abnormal weight loss Eyes: Denies blurriness of vision Ears, nose, mouth, throat, and face: Denies mucositis or sore throat Respiratory: Denies cough, dyspnea or wheezes Cardiovascular: Denies palpitation, chest discomfort Gastrointestinal:  Denies nausea, heartburn or change in bowel habits Skin: Denies abnormal skin rashes Lymphatics: Denies new lymphadenopathy or easy bruising Neurological:Denies numbness, tingling or new weaknesses Behavioral/Psych: Mood is stable, no new changes  Extremities: No lower extremity edema  All other systems were reviewed with the patient and are negative.  I have reviewed the past medical history, past surgical history, social history and family history with the patient and they are unchanged from previous note.  ALLERGIES:  is allergic to penicillins; ace inhibitors; darvocet [propoxyphene n-acetaminophen]; and sulfa antibiotics.  MEDICATIONS:  Current Outpatient Prescriptions  Medication Sig Dispense Refill  . dorzolamide (TRUSOPT) 2 % ophthalmic solution 1 DROP IN BOTH EYES TWICE A DAY  4  . ezetimibe-simvastatin (VYTORIN) 10-20 MG tablet Take 1 tablet by mouth at bedtime. 90 tablet 3  . Fiber, Guar Gum, CHEW Chew 1 capsule by mouth daily.    [name] latanoprost (XALATAN) 0.005 % ophthalmic solution Place 1 drop into both eyes at bedtime.     [name] losartan (COZAAR)  50 MG tablet Take 1 tablet (50 mg total) by mouth daily. KEEP OV. 90 tablet 1  . Magnesium 250 MG TABS Take 500 mg by mouth at bedtime.    . niacin (NIASPAN) 750 MG CR tablet Take 2 tablets (1,500 mg total) by mouth at bedtime. 180 tablet 2  . pramipexole (MIRAPEX) 0.25 MG tablet Take 0.25 mg by mouth at bedtime.     . RABEprazole (ACIPHEX) 20 MG tablet Take 1 tablet (20 mg total) by mouth daily. 90 tablet 3  . TOPROL XL 25 MG 24 hr tablet TAKE 1 TABLET DAILY 90 tablet 2  . traMADol (ULTRAM) 50 MG tablet Take 1 tablet (50 mg total) by mouth every 6 (six) hours as needed. 120 tablet 3  . XARELTO 20 MG TABS tablet TAKE 1 TABLET DAILY WITH SUPPER 90 tablet 1   No current facility-administered medications for this visit.     PHYSICAL EXAMINATION: ECOG PERFORMANCE STATUS: 1 - Symptomatic but completely ambulatory  Vitals:   02/15/16 0942  BP: (!) 116/59  Pulse: 60  Resp: 18  Temp: 97.8 F (36.6 C)   Filed Weights   02/15/16 0942  Weight: 217 lb 6.4 oz (98.6 kg)    GENERAL:alert, no distress and comfortable SKIN: skin color, texture, turgor are normal, no rashes or significant lesions EYES: normal, Conjunctiva are pink and non-injected, sclera clear OROPHARYNX:no exudate, no erythema and lips, buccal mucosa, and tongue normal  NECK: supple, thyroid normal size, non-tender, without nodularity LYMPH:  no palpable lymphadenopathy in the cervical, axillary or inguinal LUNGS: clear to auscultation and percussion with normal breathing effort HEART: regular rate & rhythm and no murmurs and no lower  extremity edema ABDOMEN:abdomen soft, non-tender and normal bowel sounds MUSCULOSKELETAL:no cyanosis of digits and no clubbing  NEURO: alert & oriented x 3 with fluent speech, no focal motor/sensory deficits EXTREMITIES: No lower extremity edema  LABORATORY DATA:  I have reviewed the data as listed   Chemistry      Component Value Date/Time   NA 138 02/15/2016 0927   K 4.6 02/15/2016 0927    CL 111 02/16/2015 0507   CL 105 01/02/2012 0938   CO2 25 02/15/2016 0927   BUN 11.5 02/15/2016 0927   CREATININE 0.9 02/15/2016 0927      Component Value Date/Time   CALCIUM 9.5 02/15/2016 0927   ALKPHOS 52 02/15/2016 0927   AST 28 02/15/2016 0927   ALT 41 02/15/2016 0927   BILITOT 1.14 02/15/2016 0927       Lab Results  Component Value Date   WBC 25.6 (H) 02/15/2016   HGB 14.5 02/15/2016   HCT 45.1 02/15/2016   MCV 85.7 02/15/2016   PLT 106 (L) 02/15/2016   NEUTROABS 4.0 02/15/2016    ASSESSMENT & PLAN:  CLL (chronic lymphocytic leukemia) (HCC) CLL stage I, lymphocytosis and lymphadenopathy diagnosed 07/31/2009, initial CT scan revealed lymphadenopathy and mildly enlarged spleen.  Treatment plan: Observation. I discussed with him the indications for treatment would be rapid doubling time on development of severe anemia thrombocytopenia, rapidly enlarging lymphadenopathy, B symptoms like fevers chills night sweats and weight loss.  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 Since his platelet counts are stable, there is no indication to treat. Blood work was reviewed. He does not have any significant anemia.  His absolute lymphocyte count is 20,000 which is slightly increased from before. Does not warrant any treatment.  Return to clinic in 6 months for follow-up with blood tests.    I spent 25 minutes talking to the patient of which more than half was spent in counseling and coordination of care.  No orders of the defined types were placed in this encounter.  The patient has a good understanding of the overall plan. he agrees with it. he will call with any problems that may develop before the next visit here.   Rulon Eisenmenger, MD 02/15/16

## 2016-02-23 DIAGNOSIS — C9110 Chronic lymphocytic leukemia of B-cell type not having achieved remission: Secondary | ICD-10-CM | POA: Diagnosis not present

## 2016-02-23 DIAGNOSIS — Z800 Family history of malignant neoplasm of digestive organs: Secondary | ICD-10-CM | POA: Diagnosis not present

## 2016-02-23 DIAGNOSIS — I2510 Atherosclerotic heart disease of native coronary artery without angina pectoris: Secondary | ICD-10-CM | POA: Diagnosis not present

## 2016-02-23 DIAGNOSIS — Z125 Encounter for screening for malignant neoplasm of prostate: Secondary | ICD-10-CM | POA: Diagnosis not present

## 2016-02-23 DIAGNOSIS — Z23 Encounter for immunization: Secondary | ICD-10-CM | POA: Diagnosis not present

## 2016-02-23 DIAGNOSIS — Z0000 Encounter for general adult medical examination without abnormal findings: Secondary | ICD-10-CM | POA: Diagnosis not present

## 2016-02-23 DIAGNOSIS — D696 Thrombocytopenia, unspecified: Secondary | ICD-10-CM | POA: Diagnosis not present

## 2016-02-23 DIAGNOSIS — M79642 Pain in left hand: Secondary | ICD-10-CM | POA: Diagnosis not present

## 2016-02-23 DIAGNOSIS — E785 Hyperlipidemia, unspecified: Secondary | ICD-10-CM | POA: Diagnosis not present

## 2016-02-23 DIAGNOSIS — I10 Essential (primary) hypertension: Secondary | ICD-10-CM | POA: Diagnosis not present

## 2016-02-23 DIAGNOSIS — R7301 Impaired fasting glucose: Secondary | ICD-10-CM | POA: Diagnosis not present

## 2016-02-23 DIAGNOSIS — I4892 Unspecified atrial flutter: Secondary | ICD-10-CM | POA: Diagnosis not present

## 2016-03-19 DIAGNOSIS — M65332 Trigger finger, left middle finger: Secondary | ICD-10-CM | POA: Diagnosis not present

## 2016-03-21 DIAGNOSIS — H401121 Primary open-angle glaucoma, left eye, mild stage: Secondary | ICD-10-CM | POA: Diagnosis not present

## 2016-03-21 DIAGNOSIS — H35033 Hypertensive retinopathy, bilateral: Secondary | ICD-10-CM | POA: Diagnosis not present

## 2016-03-21 DIAGNOSIS — Z961 Presence of intraocular lens: Secondary | ICD-10-CM | POA: Diagnosis not present

## 2016-03-21 DIAGNOSIS — H401112 Primary open-angle glaucoma, right eye, moderate stage: Secondary | ICD-10-CM | POA: Diagnosis not present

## 2016-04-16 DIAGNOSIS — M65332 Trigger finger, left middle finger: Secondary | ICD-10-CM | POA: Diagnosis not present

## 2016-05-20 ENCOUNTER — Telehealth: Payer: Self-pay

## 2016-05-20 NOTE — Telephone Encounter (Signed)
Received clearance from [facility] advised may hold Xarelto 48 hours prior to colonoscopy.Clearance faxed back to fax # [phone].

## 2016-05-30 ENCOUNTER — Other Ambulatory Visit: Payer: Self-pay | Admitting: Hematology and Oncology

## 2016-05-30 ENCOUNTER — Encounter: Payer: Self-pay | Admitting: *Deleted

## 2016-05-30 ENCOUNTER — Other Ambulatory Visit: Payer: Self-pay

## 2016-05-30 DIAGNOSIS — C9110 Chronic lymphocytic leukemia of B-cell type not having achieved remission: Secondary | ICD-10-CM

## 2016-05-30 MED ORDER — TRAMADOL HCL 50 MG PO TABS: 50.0000 mg | ORAL_TABLET | Freq: Four times a day (QID) | ORAL | 3 refills | Status: DC | PRN

## 2016-06-15 ENCOUNTER — Other Ambulatory Visit: Payer: Self-pay | Admitting: Cardiology

## 2016-06-18 DIAGNOSIS — K5730 Diverticulosis of large intestine without perforation or abscess without bleeding: Secondary | ICD-10-CM | POA: Diagnosis not present

## 2016-06-18 DIAGNOSIS — Z800 Family history of malignant neoplasm of digestive organs: Secondary | ICD-10-CM | POA: Diagnosis not present

## 2016-06-18 DIAGNOSIS — D126 Benign neoplasm of colon, unspecified: Secondary | ICD-10-CM | POA: Diagnosis not present

## 2016-06-18 DIAGNOSIS — Z1211 Encounter for screening for malignant neoplasm of colon: Secondary | ICD-10-CM | POA: Diagnosis not present

## 2016-06-20 DIAGNOSIS — D126 Benign neoplasm of colon, unspecified: Secondary | ICD-10-CM | POA: Diagnosis not present

## 2016-06-21 NOTE — Progress Notes (Signed)
Rodney Burns Date of Birth: [birth year]   History of Present Illness: Rodney Burns is seen for followup of atrial flutter and CAD. He has a history of atrial flutter and has had multiple cardioversions. He had radiofrequency ablation in March and then again in August of 2013. He had recurrent atrial flutter and has been treated with rate control and anticoagulation.  He has a history of coronary disease and is status post CABG in 1994. He had a nuclear stress test in March 2016 which showed an inferior and anteroapical infarct without evidence of ischemia. Ejection fraction was 32%.  He also has a history of CLL.   He was admitted in January 2017 with acute pleuritic chest pain. Clinically consistent with pericarditis although Ecg and Echo unremarkable. He did respond to anti-inflammatory therapy with ibuprofen and colchicine. No recurrent pain. These meds have been discontinued.   On follow up  he feels very well today. No chest pain or SOB.  No edema. No palpitations or dizziness. Energy level is good. Active playing golf and bowling.  Current Outpatient Prescriptions on File Prior to Visit  Medication Sig Dispense Refill  . ezetimibe-simvastatin (VYTORIN) 10-20 MG tablet Take 1 tablet by mouth at bedtime. 90 tablet 3  . Fiber, Guar Gum, CHEW Chew 1 capsule by mouth daily.    [name] latanoprost (XALATAN) 0.005 % ophthalmic solution Place 1 drop into both eyes at bedtime.     [name] losartan (COZAAR) 50 MG tablet TAKE 1 TABLET DAILY (KEEP OFFICE VISIT) 30 tablet 0  . Magnesium 250 MG TABS Take 500 mg by mouth at bedtime.    . niacin (NIASPAN) 750 MG CR tablet Take 2 tablets (1,500 mg total) by mouth at bedtime. 180 tablet 2  . pramipexole (MIRAPEX) 0.25 MG tablet Take 0.25 mg by mouth at bedtime.     . RABEprazole (ACIPHEX) 20 MG tablet Take 1 tablet (20 mg total) by mouth daily. 90 tablet 3  . TOPROL XL 25 MG 24 hr tablet TAKE 1 TABLET DAILY 90 tablet 2  . traMADol (ULTRAM) 50 MG tablet Take 1 tablet (50 mg  total) by mouth every 6 (six) hours as needed. 120 tablet 3  . XARELTO 20 MG TABS tablet TAKE 1 TABLET DAILY WITH SUPPER 90 tablet 1   No current facility-administered medications on file prior to visit.     Allergies  Allergen Reactions  . Penicillins Other (See Comments)    Put pt in hospital 50 yrs ago  . Ace Inhibitors Cough  . Darvocet [Propoxyphene N-Acetaminophen] Rash    Tolerates tylenol  . Sulfa Antibiotics Rash    Past Medical History:  Diagnosis Date  . Atrial tachycardia ([place])    a. s/p DCCV 09/2009; b. s/p RFCA 03/2011; c. s/p DCCV 04/2011; d. s/p RFCA 09/17/11.  . Cataract   . Chronic systolic CHF (congestive heart failure) ([place])    a. 01/2015 Echo: EF 40-45%, antsept/infsept HK, triv AI, mild MR, mod dil LA, nl RV, mildly dil RA.  [name] CLL (chronic lymphoblastic leukemia)    Stage 0-1  . Coronary artery disease involving native coronary artery without angina pectoris    a. Status post CABG in 1994:By Dr. Cyndia Bent. LIMA - L Cx, seqSVG-Diag-LAD, and SVG-PDA-PL.  [name] Cough    Consistant with ACE inhibitor mediated cough  . Glaucoma (increased eye pressure) 1991  . History of tobacco abuse    quit 1994  . Hypercholesterolemia    Well Controlled  . Hypertension   .  Ischemic cardiomyopathy    a. 02/2011 Echo: EF 40-45%;  b. 01/2015 Echo: EF 40-45%.  . Malaria 1972   Hx of  . Pericarditis    a. 01/2015-->Treated w/ ibuprofen/colchicine.  . Sleep-disordered breathing 06/20/2011    Past Surgical History:  Procedure Laterality Date  . ATRIAL FLUTTER ABLATION N/A 04/11/2011   Procedure: ATRIAL FLUTTER ABLATION;  Surgeon: Deboraha Sprang, MD;  Location: [facility] CATH LAB;  Service: Cardiovascular;  Laterality: N/A;  . ATRIAL FLUTTER ABLATION N/A 09/17/2011   Procedure: ATRIAL FLUTTER ABLATION;  Surgeon: Thompson Grayer, MD;  Location: [facility] CATH LAB;  Service: Cardiovascular;  Laterality: N/A;  . CARDIOVERSION  06/03/2011   Procedure: CARDIOVERSION;  Surgeon: Deboraha Sprang, MD;  Location: [facility];  Service: Cardiovascular;  Laterality: N/A;  . [place]   By Dr. Cyndia Bent. LIMA - L Cx, seqSVG-Diag-LAD, and SVG-PDA-PL  . ELECTROPHYSIOLOGY STUDY N/A 04/11/2011   Procedure: ELECTROPHYSIOLOGY STUDY;  Surgeon: Deboraha Sprang, MD;  Location[facility] C[facility] LAB;  Service: Cardiovascular;  Laterality: N/A;  . EP study and ablation  2013   by Dr Caryl Comes, repeat ablation by Dr Rayann Heman  . NM MYOVIEW LTD  03/2014   scar in the inferior and anteroapical regions without ischemia. This is similar to finding on Myoview in 2013. EF is a little lower 39%>>32%.  . US ECHOCARDIOGRAPHY  09-07-09; 02/2011   a. Est EF 40-45%; b. EF 40-45%. Gr 2 DD. Mild LA dil    History  Smoking Status  . Former Smoker  . Quit date: 01/22/1992  Smokeless Tobacco  . Never Used    History  Alcohol Use No    History reviewed. No pertinent family history.  Review of Systems: As noted in history of present illness.  All other systems were reviewed and are negative.  Physical Exam: BP 130/71   Pulse (!) 46   Ht 5' 10.5" (1.791 m)   Wt 218 lb (98.9 kg)   BMI 30.84 kg/m  He is a pleasant white male in no acute distress. He is normocephalic, atraumatic. Pupils are equal round and reactive to light accommodation. Extraocular movements are full. Oropharynx is clear. Neck is supple without JVD, adenopathy, or megaly, or bruits. Lungs are clear. Cardiac exam reveals an irregular rate and rhythm without gallop, murmur, or click. No rub. Abdomen is obese, soft, nontender. He has good pedal pulses. He has no edema. He is alert and oriented x3. Cranial nerves II through XII are intact.  LABORATORY DATA: Lab Results  Component Value Date   WBC 25.6 (H) 02/15/2016   HGB 14.5 02/15/2016   HCT 45.1 02/15/2016   PLT 106 (L) 02/15/2016   GLUCOSE 136 02/15/2016   ALT 41 02/15/2016   AST 28 02/15/2016   NA 138 02/15/2016   K 4.6 02/15/2016   CL 111 02/16/2015   CREATININE 0.9 02/15/2016   BUN 11.5 02/15/2016    CO2 25 02/15/2016   INR 1.5 (H) 04/09/2011   Labs dated 02/23/16: cholesterol 128, triglycerides 59, HDL 50, LDL 65. Glucose 114. Other chemistries normal.  Ecg today shows sinus brady rate 46. TWA c/w lateral ischemia. Old septal infarct. I have personally reviewed and interpreted this study.  Assessment / Plan: 1. Recurrent atrial flutter. Now status post radiofrequency ablation x2. Previously felt to be chronic but he is in sinus rhythm today. He is somewhat bradycardic but is asymptomatic. Was never aware of Aflutter either.   Continue metoprolol for rate control.  If he should develop symptoms of bradycardia we may need to discontinue. Continue anticoagulation with Xarelto 20 mg daily.   2. Coronary disease status post CABG in 1994. Patient is asymptomatic. Continue  beta blocker therapy. Myvoview study in March 2016 showed inferior and anteroapical scar without ischemia. EF 32%. Findings similar to prior Myoview in 2013.   3. Chronic congestive heart failure. Ejection fraction by Echo in January 2017 improved to 40-45%.  No evidence of volume overload. Continue ARB and beta blocker therapy.   4. CLL stage 0. Followed by oncology.  5. Hypertension, controlled.   6. Thrombocytopenia.  7. Hyperlipidemia. On Vytorin and niacin. Excellent control.   followup in 6 months.

## 2016-06-24 ENCOUNTER — Encounter: Payer: Self-pay | Admitting: Cardiology

## 2016-06-24 ENCOUNTER — Ambulatory Visit (INDEPENDENT_AMBULATORY_CARE_PROVIDER_SITE_OTHER): Payer: Medicare Other | Admitting: Cardiology

## 2016-06-24 VITALS — BP 130/71 | HR 46 | Ht 70.5 in | Wt 218.0 lb

## 2016-06-24 DIAGNOSIS — I255 Ischemic cardiomyopathy: Secondary | ICD-10-CM

## 2016-06-24 DIAGNOSIS — Z7901 Long term (current) use of anticoagulants: Secondary | ICD-10-CM

## 2016-06-24 DIAGNOSIS — I10 Essential (primary) hypertension: Secondary | ICD-10-CM | POA: Diagnosis not present

## 2016-06-24 DIAGNOSIS — Z951 Presence of aortocoronary bypass graft: Secondary | ICD-10-CM

## 2016-06-24 DIAGNOSIS — I484 Atypical atrial flutter: Secondary | ICD-10-CM

## 2016-06-24 DIAGNOSIS — I5022 Chronic systolic (congestive) heart failure: Secondary | ICD-10-CM

## 2016-06-24 DIAGNOSIS — I2589 Other forms of chronic ischemic heart disease: Secondary | ICD-10-CM

## 2016-06-24 NOTE — Patient Instructions (Signed)
Continue your current therapy  I will see you in 6 months.   

## 2016-06-25 ENCOUNTER — Other Ambulatory Visit: Payer: Self-pay | Admitting: Cardiology

## 2016-07-11 ENCOUNTER — Other Ambulatory Visit: Payer: Self-pay | Admitting: Cardiology

## 2016-07-11 DIAGNOSIS — H00021 Hordeolum internum right upper eyelid: Secondary | ICD-10-CM | POA: Diagnosis not present

## 2016-07-12 DIAGNOSIS — M65332 Trigger finger, left middle finger: Secondary | ICD-10-CM | POA: Diagnosis not present

## 2016-08-09 ENCOUNTER — Other Ambulatory Visit: Payer: Self-pay | Admitting: Cardiology

## 2016-08-15 ENCOUNTER — Other Ambulatory Visit: Payer: Medicare Other

## 2016-08-15 ENCOUNTER — Ambulatory Visit: Payer: Medicare Other | Admitting: Hematology and Oncology

## 2016-08-21 ENCOUNTER — Other Ambulatory Visit: Payer: Self-pay

## 2016-08-21 ENCOUNTER — Telehealth: Payer: Self-pay | Admitting: *Deleted

## 2016-08-21 DIAGNOSIS — C9110 Chronic lymphocytic leukemia of B-cell type not having achieved remission: Secondary | ICD-10-CM

## 2016-08-22 ENCOUNTER — Ambulatory Visit (HOSPITAL_BASED_OUTPATIENT_CLINIC_OR_DEPARTMENT_OTHER): Payer: Medicare Other | Admitting: Hematology and Oncology

## 2016-08-22 ENCOUNTER — Encounter: Payer: Self-pay | Admitting: Hematology and Oncology

## 2016-08-22 ENCOUNTER — Other Ambulatory Visit (HOSPITAL_BASED_OUTPATIENT_CLINIC_OR_DEPARTMENT_OTHER): Payer: Medicare Other

## 2016-08-22 DIAGNOSIS — D696 Thrombocytopenia, unspecified: Secondary | ICD-10-CM

## 2016-08-22 DIAGNOSIS — C9110 Chronic lymphocytic leukemia of B-cell type not having achieved remission: Secondary | ICD-10-CM

## 2016-08-22 LAB — COMPREHENSIVE METABOLIC PANEL
ALT: 23 U/L (ref 0–55)
ANION GAP: 7 meq/L (ref 3–11)
AST: 22 U/L (ref 5–34)
Albumin: 4.1 g/dL (ref 3.5–5.0)
Alkaline Phosphatase: 46 U/L (ref 40–150)
BUN: 16.6 mg/dL (ref 7.0–26.0)
CHLORIDE: 107 meq/L (ref 98–109)
CO2: 28 meq/L (ref 22–29)
Calcium: 9.5 mg/dL (ref 8.4–10.4)
Creatinine: 0.9 mg/dL (ref 0.7–1.3)
EGFR: 87 mL/min/{1.73_m2} — AB (ref >90)
Glucose: 98 mg/dl (ref 70–140)
POTASSIUM: 4.5 meq/L (ref 3.5–5.1)
Sodium: 141 mEq/L (ref 136–145)
Total Bilirubin: 0.82 mg/dL (ref 0.20–1.20)
Total Protein: 6.6 g/dL (ref 6.4–8.3)

## 2016-08-22 LAB — CBC WITH DIFFERENTIAL/PLATELET
BASO%: 0.1 % (ref 0.0–2.0)
BASOS ABS: 0 10*3/uL (ref 0.0–0.1)
EOS ABS: 0.1 10*3/uL (ref 0.0–0.5)
EOS%: 0.5 % (ref 0.0–7.0)
HCT: 44.1 % (ref 38.4–49.9)
HGB: 13.8 g/dL (ref 13.0–17.1)
LYMPH%: 80.3 % — AB (ref 14.0–49.0)
MCH: 26.9 pg — AB (ref 27.2–33.4)
MCHC: 31.3 g/dL — AB (ref 32.0–36.0)
MCV: 86 fL (ref 79.3–98.0)
MONO#: 0.3 10*3/uL (ref 0.1–0.9)
MONO%: 1.6 % (ref 0.0–14.0)
NEUT#: 3.6 10*3/uL (ref 1.5–6.5)
NEUT%: 17.5 % — ABNORMAL LOW (ref 39.0–75.0)
PLATELETS: 128 10*3/uL — AB (ref 140–400)
RBC: 5.13 10*6/uL (ref 4.20–5.82)
RDW: 14.3 % (ref 11.0–14.6)
WBC: 20.6 10*3/uL — AB (ref 4.0–10.3)
lymph#: 16.6 10*3/uL — ABNORMAL HIGH (ref 0.9–3.3)
nRBC: 0 % (ref 0–0)

## 2016-08-22 NOTE — Assessment & Plan Note (Signed)
CLL stage I, lymphocytosis and lymphadenopathy diagnosed 07/31/2009, initial CT scan revealed lymphadenopathy and mildly enlarged spleen.  Treatment plan: Observation. I discussed with him the indications for treatment would be rapid doubling time on development of severe anemia thrombocytopenia, rapidly enlarging lymphadenopathy, B symptoms like fevers chills night sweats and weight loss.  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 Since his platelet counts are stable, there is no indication to treat. Blood work was reviewed.  He does not have any change in his absolute lymphocyte count.  He does not have any significant anemia.   Return to clinic in 6 months for follow-up with blood tests.

## 2016-08-22 NOTE — Progress Notes (Signed)
Patient Care Team: Hulan Fess, MD as PCP - General (Family Medicine)  DIAGNOSIS:  Encounter Diagnosis  Name Primary?  . CLL (chronic lymphocytic leukemia) ([place])     CHIEF COMPLIANT:  Follow-up of CLL  INTERVAL HISTORY: Rodney Burns is a  71 year old with above-mentioned history of CLL who is currently doing very well. He does not have any fevers chills night sweats or weight loss. He denies any lymphadenopathy. He tells me that he is no longer in atrial flutter.His cardiologist is contemplating on cutting back on his beta blocker so that he would have more energy.  REVIEW OF SYSTEMS:   Constitutional: Denies fevers, chills or abnormal weight loss Eyes: Denies blurriness of vision Ears, nose, mouth, throat, and face: Denies mucositis or sore throat Respiratory: Denies cough, dyspnea or wheezes Cardiovascular: Denies palpitation, chest discomfort Gastrointestinal:  Denies nausea, heartburn or change in bowel habits Skin: Denies abnormal skin rashes Lymphatics: Denies new lymphadenopathy or easy bruising Neurological:Denies numbness, tingling or new weaknesses Behavioral/Psych: Mood is stable, no new changes  Extremities: No lower extremity edema All other systems were reviewed with the patient and are negative.  I have reviewed the past medical history, past surgical history, social history and family history with the patient and they are unchanged from previous note.  ALLERGIES:  is allergic to penicillins; ace inhibitors; darvocet [propoxyphene n-acetaminophen]; and sulfa antibiotics.  MEDICATIONS:  Current Outpatient Prescriptions  Medication Sig Dispense Refill  . ezetimibe-simvastatin (VYTORIN) 10-20 MG tablet Take 1 tablet by mouth at bedtime. 90 tablet 3  . Fiber, Guar Gum, CHEW Chew 1 capsule by mouth daily.    [name] latanoprost (XALATAN) 0.005 % ophthalmic solution Place 1 drop into both eyes at bedtime.     [name] losartan (COZAAR) 50 MG tablet TAKE 1 TABLET DAILY (KEEP  OFFICE VISIT) 90 tablet 3  . Magnesium 250 MG TABS Take 500 mg by mouth at bedtime.    . pramipexole (MIRAPEX) 0.25 MG tablet Take 0.25 mg by mouth at bedtime.     . RABEprazole (ACIPHEX) 20 MG tablet Take 1 tablet (20 mg total) by mouth daily. 90 tablet 3  . TOPROL XL 25 MG 24 hr tablet TAKE 1 TABLET DAILY 90 tablet 2  . traMADol (ULTRAM) 50 MG tablet Take 1 tablet (50 mg total) by mouth every 6 (six) hours as needed. 120 tablet 3  . XARELTO 20 MG TABS tablet TAKE 1 TABLET DAILY WITH SUPPER 90 tablet 2   No current facility-administered medications for this visit.     PHYSICAL EXAMINATION: ECOG PERFORMANCE STATUS: 1 - Symptomatic but completely ambulatory  Vitals:   08/22/16 1028  BP: 130/63  Pulse: 68  Resp: 20  Temp: 98.7 F (37.1 C)   Filed Weights   08/22/16 1028  Weight: 217 lb 1.6 oz (98.5 kg)    GENERAL:alert, no distress and comfortable SKIN: skin color, texture, turgor are normal, no rashes or significant lesions EYES: normal, Conjunctiva are pink and non-injected, sclera clear OROPHARYNX:no exudate, no erythema and lips, buccal mucosa, and tongue normal  NECK: supple, thyroid normal size, non-tender, without nodularity LYMPH:  no palpable lymphadenopathy in the cervical, axillary or inguinal LUNGS: clear to auscultation and percussion with normal breathing effort HEART: regular rate & rhythm and no murmurs and no lower extremity edema ABDOMEN:abdomen soft, non-tender and normal bowel sounds MUSCULOSKELETAL:no cyanosis of digits and no clubbing  NEURO: alert & oriented x 3 with fluent speech, no focal motor/sensory deficits EXTREMITIES: No lower extremity edema  LABORATORY DATA:  I have reviewed the data as listed   Chemistry      Component Value Date/Time   NA 141 08/22/2016 0932   K 4.5 08/22/2016 0932   CL 111 02/16/2015 0507   CL 105 01/02/2012 0938   CO2 28 08/22/2016 0932   BUN 16.6 08/22/2016 0932   CREATININE 0.9 08/22/2016 0932      Component  Value Date/Time   CALCIUM 9.5 08/22/2016 0932   ALKPHOS 46 08/22/2016 0932   AST 22 08/22/2016 0932   ALT 23 08/22/2016 0932   BILITOT 0.82 08/22/2016 0932       Lab Results  Component Value Date   WBC 20.6 (H) 08/22/2016   HGB 13.8 08/22/2016   HCT 44.1 08/22/2016   MCV 86.0 08/22/2016   PLT 128 (L) 08/22/2016   NEUTROABS 3.6 08/22/2016    ASSESSMENT & PLAN:  CLL (chronic lymphocytic leukemia) (HCC) CLL stage I, lymphocytosis and lymphadenopathy diagnosed 07/31/2009, initial CT scan revealed lymphadenopathy and mildly enlarged spleen.  Treatment plan: Observation. I discussed with him the indications for treatment would be rapid doubling time on development of severe anemia thrombocytopenia, rapidly enlarging lymphadenopathy, B symptoms like fevers chills night sweats and weight loss.  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  His platelet count today is 128 which is the best it has been in a long time. He does not have any significant anemia.   Return to clinic in 6 months for follow-up with blood tests.    I spent 25 minutes talking to the patient of which more than half was spent in counseling and coordination of care.  Orders Placed This Encounter  Procedures  . CBC with Differential    Standing Status:   Future    Standing Expiration Date:   08/22/2017  . Comprehensive metabolic panel    Standing Status:   Future    Standing Expiration Date:   08/22/2017  . Lactate dehydrogenase (LDH)    Standing Status:   Future    Standing Expiration Date:   08/22/2017   The patient has a good understanding of the overall plan. he agrees with it. he will call with any problems that may develop before the next visit here.   Rulon Eisenmenger, MD 08/22/16

## 2016-08-31 ENCOUNTER — Other Ambulatory Visit: Payer: Self-pay | Admitting: Cardiology

## 2016-10-03 ENCOUNTER — Other Ambulatory Visit: Payer: Self-pay

## 2016-10-03 ENCOUNTER — Other Ambulatory Visit: Payer: Self-pay | Admitting: Hematology and Oncology

## 2016-10-03 DIAGNOSIS — C9110 Chronic lymphocytic leukemia of B-cell type not having achieved remission: Secondary | ICD-10-CM

## 2016-10-03 MED ORDER — TRAMADOL HCL 50 MG PO TABS: 50.0000 mg | ORAL_TABLET | Freq: Four times a day (QID) | ORAL | 5 refills | Status: DC | PRN

## 2016-10-17 DIAGNOSIS — M65341 Trigger finger, right ring finger: Secondary | ICD-10-CM | POA: Diagnosis not present

## 2016-10-17 DIAGNOSIS — M65332 Trigger finger, left middle finger: Secondary | ICD-10-CM | POA: Diagnosis not present

## 2016-10-21 ENCOUNTER — Telehealth: Payer: Self-pay

## 2016-10-21 NOTE — Telephone Encounter (Signed)
Received surgical clearance from [facility].Dr.Jordan cleared patient for upcoming surgery.Advised ok to hold Xarelto 48 hours prior.Clearance faxed back to fax # [phone].

## 2016-10-26 DIAGNOSIS — Z23 Encounter for immunization: Secondary | ICD-10-CM | POA: Diagnosis not present

## 2016-10-29 DIAGNOSIS — M65332 Trigger finger, left middle finger: Secondary | ICD-10-CM | POA: Diagnosis not present

## 2016-11-08 DIAGNOSIS — Z4789 Encounter for other orthopedic aftercare: Secondary | ICD-10-CM | POA: Diagnosis not present

## 2016-11-08 DIAGNOSIS — M65332 Trigger finger, left middle finger: Secondary | ICD-10-CM | POA: Diagnosis not present

## 2016-11-08 DIAGNOSIS — M65341 Trigger finger, right ring finger: Secondary | ICD-10-CM | POA: Diagnosis not present

## 2016-11-08 NOTE — Telephone Encounter (Signed)
No entry 

## 2016-12-06 DIAGNOSIS — M65332 Trigger finger, left middle finger: Secondary | ICD-10-CM | POA: Diagnosis not present

## 2016-12-06 DIAGNOSIS — M65341 Trigger finger, right ring finger: Secondary | ICD-10-CM | POA: Diagnosis not present

## 2016-12-19 DIAGNOSIS — H01009 Unspecified blepharitis unspecified eye, unspecified eyelid: Secondary | ICD-10-CM | POA: Diagnosis not present

## 2016-12-19 DIAGNOSIS — H401121 Primary open-angle glaucoma, left eye, mild stage: Secondary | ICD-10-CM | POA: Diagnosis not present

## 2016-12-19 DIAGNOSIS — H401112 Primary open-angle glaucoma, right eye, moderate stage: Secondary | ICD-10-CM | POA: Diagnosis not present

## 2016-12-19 DIAGNOSIS — H00021 Hordeolum internum right upper eyelid: Secondary | ICD-10-CM | POA: Diagnosis not present

## 2016-12-21 HISTORY — PX: TRIGGER FINGER RELEASE: SHX641

## 2016-12-29 ENCOUNTER — Other Ambulatory Visit: Payer: Self-pay | Admitting: Cardiology

## 2017-01-20 DIAGNOSIS — R05 Cough: Secondary | ICD-10-CM | POA: Diagnosis not present

## 2017-01-23 NOTE — Progress Notes (Signed)
Office Visit Note  Patient: Rodney Burns             Date of Birth: August 23, 1945           MRN: [id number]             PCP: Hulan Fess, MD Referring: Hulan Fess, MD Visit Date: 01/24/2017 Occupation: '@GUAROCC' @    Subjective:  Bilateral shoulder pain    History of Present Illness: [name] is a 72 y.o. male with history of osteoarthritis. He returns today after his last visit in 2014. At that time he had left shoulder pain and after the cortisone injection he did really well. He has had problems with bilateral trigger fingers of 3rd digit, he had left trigger finger release at [facility] in November 2018 and a cortisone injection in his right trigger finger at the same time.  He has been having bilateral shoulder pain for the last 1 year. Right shoulder joint is worse than the left and has limited range of motion. He has difficulty sleeping on the side. He states the pain in his shoulders started after cutting down the tree in his yard after the snow storm He usually sleeps on his back. Left trochanteric bursitis continues to cause discomfort. He denies any joint swelling.     Activities of Daily Living:  Patient reports morning stiffness for 2-3 hours.   Patient Reports nocturnal pain.  Difficulty dressing/grooming: Denies Difficulty climbing stairs: Denies Difficulty getting out of chair: Denies Difficulty using hands for taps, buttons, cutlery, and/or writing: Denies   Review of Systems  Constitutional: Positive for fatigue. Negative for night sweats and weakness.  HENT: Negative for mouth sores, mouth dryness and nose dryness.   Eyes: Negative for redness and dryness.  Respiratory: Positive for cough. Negative for hemoptysis, shortness of breath and difficulty breathing.   Cardiovascular: Negative for chest pain, palpitations, hypertension, irregular heartbeat and swelling in legs/feet.  Gastrointestinal: Negative for blood in stool, constipation and  diarrhea.  Endocrine: Negative for increased urination.  Genitourinary: Negative for painful urination.  Musculoskeletal: Positive for arthralgias, joint pain and morning stiffness. Negative for joint swelling, myalgias, muscle weakness, muscle tenderness and myalgias.  Skin: Negative for color change, pallor, rash, hair loss, nodules/bumps, redness, skin tightness, ulcers and sensitivity to sunlight.  Allergic/Immunologic: Negative for susceptible to infections.  Neurological: Negative for dizziness, fainting, memory loss and night sweats.  Hematological: Negative for swollen glands.  Psychiatric/Behavioral: Negative for depressed mood and sleep disturbance. The patient is not nervous/anxious.     PMFS History:  Patient Active Problem List   Diagnosis Date Noted  . CAD (coronary artery disease) of artery bypass graft 12/07/2015  . Chronic anticoagulation 03/06/2015  . Pericarditis 02/15/2015  . Hx of CABG x 5 '94   . Cardiomyopathy, ischemic 01/28/2012  . Sleep-disordered breathing 06/20/2011  . Syncope and collapse 03/29/2011  . Atrial flutter-atypical   . CLL (chronic lymphocytic leukemia) ([place])   . Dyslipidemia   . Essential hypertension     Past Medical History:  Diagnosis Date  . Atrial tachycardia ([place])    a. s/p DCCV 09/2009; b. s/p RFCA 03/2011; c. s/p DCCV 04/2011; d. s/p RFCA 09/17/11.  . Cataract   . Chronic systolic CHF (congestive heart failure) ([place])    a. 01/2015 Echo: EF 40-45%, antsept/infsept HK, triv AI, mild MR, mod dil LA, nl RV, mildly dil RA.  [name] CLL (chronic lymphoblastic leukemia)    Stage 0-1  . Coronary artery  disease involving native coronary artery without angina pectoris    a. Status post CABG in 1994:By Dr. Cyndia Bent. LIMA - L Cx, seqSVG-Diag-LAD, and SVG-PDA-PL.  [name] Cough    Consistant with ACE inhibitor mediated cough  . Glaucoma (increased eye pressure) 1991  . History of tobacco abuse    quit 1994  . Hypercholesterolemia    Well Controlled  .  Hypertension   . Ischemic cardiomyopathy    a. 02/2011 Echo: EF 40-45%;  b. 01/2015 Echo: EF 40-45%.  . Malaria 1972   Hx of  . Pericarditis    a. 01/2015-->Treated w/ ibuprofen/colchicine.  . Sleep-disordered breathing 06/20/2011    Family History  Problem Relation Age of Onset  . Heart failure Father   . Cancer Brother        colon  . Down syndrome Son    Past Surgical History:  Procedure Laterality Date  . ATRIAL FLUTTER ABLATION N/A 04/11/2011   Procedure: ATRIAL FLUTTER ABLATION;  Surgeon: Deboraha Sprang, MD;  Location: [facility] CATH LAB;  Service: Cardiovascular;  Laterality: N/A;  . ATRIAL FLUTTER ABLATION N/A 09/17/2011   Procedure: ATRIAL FLUTTER ABLATION;  Surgeon: Thompson Grayer, MD;  Location: [facility] CATH LAB;  Service: Cardiovascular;  Laterality: N/A;  . CARDIOVERSION  06/03/2011   Procedure: CARDIOVERSION;  Surgeon: Deboraha Sprang, MD;  Location: [facility];  Service: Cardiovascular;  Laterality: N/A;  . [place]   By Dr. Cyndia Bent. LIMA - L Cx, seqSVG-Diag-LAD, and SVG-PDA-PL  . ELECTROPHYSIOLOGY STUDY N/A 04/11/2011   Procedure: ELECTROPHYSIOLOGY STUDY;  Surgeon: Deboraha Sprang, MD;  Location: [facility] CATH LAB;  Service: Cardiovascular;  Laterality: N/A;  . EP study and ablation  2013   by Dr Caryl Comes, repeat ablation by Dr Rayann Heman  . NM MYOVIEW LTD  03/2014   scar in the inferior and anteroapical regions without ischemia. This is similar to finding on Myoview in 2013. EF is a little lower 39%>>32%.  [name] TRIGGER FINGER RELEASE  12/2016   left hand   . US ECHOCARDIOGRAPHY  09-07-09; 02/2011   a. Est EF 40-45%; b. EF 40-45%. Gr 2 DD. Mild LA dil   Social History   Social History Narrative  . Not on file     Objective: Vital Signs: BP 127/87 (BP Location: Left Arm, Patient Position: Sitting, Cuff Size: Normal)   Pulse (!) 118   Resp 19   Ht '5\' 10"'  (1.778 m)   Wt 222 lb (100.7 kg)   BMI 31.85 kg/m    Physical Exam  Constitutional: He is oriented to person, place,  and time. He appears well-developed and well-nourished.  HENT:  Head: Normocephalic and atraumatic.  Eyes: Conjunctivae and EOM are normal. Pupils are equal, round, and reactive to light.  Neck: Normal range of motion. Neck supple.  Cardiovascular: Normal rate, regular rhythm and normal heart sounds.  Pulmonary/Chest: Effort normal and breath sounds normal.  Abdominal: Soft. Bowel sounds are normal.  Neurological: He is alert and oriented to person, place, and time.  Skin: Skin is warm and dry. Capillary refill takes less than 2 seconds.  Psychiatric: He has a normal mood and affect. His behavior is normal.  Nursing note and vitals reviewed.    Musculoskeletal Exam: C-spine and thoracic lumbar spine good range of motion. He had. Discomfort range of motion of his bilateral shoulder joints. Elbow joints wrist joints are good range of motion. He has some DIP PIP thickening in his hands consistent with osteoarthritis without  any warmth swelling or synovitis. Hip joints knee joints ankles MTPs PIPs with good range of motion. He has tenderness on palpation over left trochanteric bursa consistent with trochanteric bursitis.  CDAI Exam: No CDAI exam completed.    Investigation: No additional findings.  10/11/2010 from [facility]: Uric acid 6.4, Ck normal, Ace normal, RF -, CCP -, ANA -, HLA B27 -, ESR 1   CBC Latest Ref Rng & Units 08/22/2016 02/15/2016 08/03/2015  WBC 4.0 - 10.3 10e3/uL 20.6(H) 25.6(H) 20.9(H)  Hemoglobin 13.0 - 17.1 g/dL 13.8 14.5 14.0  Hematocrit 38.4 - 49.9 % 44.1 45.1 44.8  Platelets 140 - 400 10e3/uL 128(L) 106(L) 100(L)   CMP Latest Ref Rng & Units 08/22/2016 02/15/2016 08/03/2015  Glucose 70 - 140 mg/dl 98 136 120  BUN 7.0 - 26.0 mg/dL 16.6 11.5 15.2  Creatinine 0.7 - 1.3 mg/dL 0.9 0.9 0.9  Sodium 136 - 145 mEq/L 141 138 139  Potassium 3.5 - 5.1 mEq/L 4.5 4.6 4.5  Chloride 101 - 111 mmol/L - - -  CO2 22 - 29 mEq/L '28 25 25  ' Calcium 8.4 - 10.4 mg/dL 9.5 9.5 9.4  Total Protein  6.4 - 8.3 g/dL 6.6 6.5 6.6  Total Bilirubin 0.20 - 1.20 mg/dL 0.82 1.14 0.98  Alkaline Phos 40 - 150 U/L 46 52 53  AST 5 - 34 U/L '22 28 28  ' ALT 0 - 55 U/L 23 41 37    Imaging: Xr Shoulder Left  Result Date: 01/24/2017 No glenohumeral joint space narrowing was noted. no chondrocalcinosis was noted. type 2 acromion was noted. Impression: unremarkable x-ray of the shoulder joint.  Xr Shoulder Right  Result Date: 01/24/2017 No glenohumeral joint space narrowing was noted. no chondrocalcinosis was noted. type 2 acromion was noted. Impression: unremarkable x-ray of the shoulder joint.   Speciality Comments: No specialty comments available.    Procedures:  Large Joint Inj: bilateral glenohumeral on 01/24/2017 9:47 AM Indications: pain Details: 27 G 1.5 in needle, posterior approach  Arthrogram: No  Medications (Right): 1.5 mL lidocaine 1 %; 40 mg triamcinolone acetonide 40 MG/ML Aspirate (Right): 0 mL Medications (Left): 1.5 mL lidocaine 1 %; 40 mg triamcinolone acetonide 40 MG/ML Aspirate (Left): 0 mL Outcome: tolerated well, no immediate complications Procedure, treatment alternatives, risks and benefits explained, specific risks discussed. Consent was given by the patient. Immediately prior to procedure a time out was called to verify the correct patient, procedure, equipment, support staff and site/side marked as required. Patient was prepped and draped in the usual sterile fashion.     Allergies: Penicillins; Ace inhibitors; Darvocet [propoxyphene n-acetaminophen]; and Sulfa antibiotics   Assessment / Plan:     Visit Diagnoses: Chronic pain of both shoulders - he has been having pain and discomfort in shoulders including nocturnal pain. Plan: XR Shoulder Right, XR Shoulder Left. X-rays obtained of bilateral shoulders today were unremarkable. Per his request I injected his bilateral shoulders with cortisone. Procedures described above. I offered physical therapy which he declined.  Have given him a handout on shoulder joint exercises. If his symptoms do not improve is supposed to notify us.  Pain in both hands: Clinical findings are consistent with osteoarthritis. Joint protection and muscle strengthening discussed.  Trochanteric bursitis, left hip - he has chronic history of recurrent trochanteric bursitis. Given her handout for trochanteric bursitis.  Other medical problems are listed as follows:  CLL (chronic lymphocytic leukemia) ([place]) - Followed by Dr. [name]  Cardiomyopathy, ischemic - Echo 02/16/15: EF 40-45%  Essential hypertension: His blood pressure is well controlled.  Atrial flutter, unspecified type ([place]) - Followed by Dr. Martinique  Dyslipidemia  Chronic anticoagulation - Xarelto 20 mg po once daily  Hx of CABG x 5 '94  Restless leg syndrome  History of thrombocytopenia - Followed by Dr. [name]    Orders: Orders Placed This Encounter  Procedures  . Large Joint Inj: bilateral glenohumeral  . XR Shoulder Right  . XR Shoulder Left   No orders of the defined types were placed in this encounter.   Face-to-face time spent with patient was 45 minutes. Greater than 50% of time was spent in counseling and coordination of care.  Follow-Up Instructions: Return if symptoms worsen or fail to improve, for Osteoarthritis.  Bo Merino, MD Note - This record has been created using Editor, commissioning.  Chart creation errors have been sought, but may not always  have been located. Such creation errors do not reflect on  the standard of medical care.

## 2017-01-24 ENCOUNTER — Ambulatory Visit (INDEPENDENT_AMBULATORY_CARE_PROVIDER_SITE_OTHER): Payer: Medicare Other

## 2017-01-24 ENCOUNTER — Ambulatory Visit (INDEPENDENT_AMBULATORY_CARE_PROVIDER_SITE_OTHER): Payer: Self-pay

## 2017-01-24 ENCOUNTER — Encounter (INDEPENDENT_AMBULATORY_CARE_PROVIDER_SITE_OTHER): Payer: Self-pay

## 2017-01-24 ENCOUNTER — Ambulatory Visit (INDEPENDENT_AMBULATORY_CARE_PROVIDER_SITE_OTHER): Payer: Medicare Other | Admitting: Rheumatology

## 2017-01-24 ENCOUNTER — Encounter: Payer: Self-pay | Admitting: Rheumatology

## 2017-01-24 VITALS — BP 127/87 | HR 118 | Resp 19 | Ht 70.0 in | Wt 222.0 lb

## 2017-01-24 DIAGNOSIS — I4892 Unspecified atrial flutter: Secondary | ICD-10-CM

## 2017-01-24 DIAGNOSIS — I255 Ischemic cardiomyopathy: Secondary | ICD-10-CM

## 2017-01-24 DIAGNOSIS — Z951 Presence of aortocoronary bypass graft: Secondary | ICD-10-CM

## 2017-01-24 DIAGNOSIS — G8929 Other chronic pain: Secondary | ICD-10-CM

## 2017-01-24 DIAGNOSIS — I2589 Other forms of chronic ischemic heart disease: Secondary | ICD-10-CM

## 2017-01-24 DIAGNOSIS — M25511 Pain in right shoulder: Secondary | ICD-10-CM

## 2017-01-24 DIAGNOSIS — M7062 Trochanteric bursitis, left hip: Secondary | ICD-10-CM

## 2017-01-24 DIAGNOSIS — I10 Essential (primary) hypertension: Secondary | ICD-10-CM | POA: Diagnosis not present

## 2017-01-24 DIAGNOSIS — Z7901 Long term (current) use of anticoagulants: Secondary | ICD-10-CM | POA: Diagnosis not present

## 2017-01-24 DIAGNOSIS — M25512 Pain in left shoulder: Secondary | ICD-10-CM | POA: Diagnosis not present

## 2017-01-24 DIAGNOSIS — Z862 Personal history of diseases of the blood and blood-forming organs and certain disorders involving the immune mechanism: Secondary | ICD-10-CM | POA: Diagnosis not present

## 2017-01-24 DIAGNOSIS — E785 Hyperlipidemia, unspecified: Secondary | ICD-10-CM | POA: Diagnosis not present

## 2017-01-24 DIAGNOSIS — M79642 Pain in left hand: Secondary | ICD-10-CM

## 2017-01-24 DIAGNOSIS — M79641 Pain in right hand: Secondary | ICD-10-CM

## 2017-01-24 DIAGNOSIS — G2581 Restless legs syndrome: Secondary | ICD-10-CM

## 2017-01-24 DIAGNOSIS — C9190 Lymphoid leukemia, unspecified not having achieved remission: Secondary | ICD-10-CM

## 2017-01-24 DIAGNOSIS — C9110 Chronic lymphocytic leukemia of B-cell type not having achieved remission: Secondary | ICD-10-CM

## 2017-01-24 MED ORDER — TRIAMCINOLONE ACETONIDE 40 MG/ML IJ SUSP
40.0000 mg | INTRAMUSCULAR | Status: AC | PRN
  Administered 2017-01-24: 40 mg via INTRA_ARTICULAR

## 2017-01-24 MED ORDER — LIDOCAINE HCL 1 % IJ SOLN
1.5000 mL | INTRAMUSCULAR | Status: AC | PRN
  Administered 2017-01-24: 1.5 mL

## 2017-01-24 NOTE — Patient Instructions (Signed)
Iliotibial Band Syndrome Rehab Ask your health care provider which exercises are safe for you. Do exercises exactly as told by your health care provider and adjust them as directed. It is normal to feel mild stretching, pulling, tightness, or discomfort as you do these exercises, but you should stop right away if you feel sudden pain or your pain gets worse.Do not begin these exercises until told by your health care provider. Stretching and range of motion exercises These exercises warm up your muscles and joints and improve the movement and flexibility of your hip and pelvis. Exercise A: Quadriceps, prone  1. Lie on your abdomen on a firm surface, such as a bed or padded floor. 2. Bend your left / right knee and hold your ankle. If you cannot reach your ankle or pant leg, loop a belt around your foot and grab the belt instead. 3. Gently pull your heel toward your buttocks. Your knee should not slide out to the side. You should feel a stretch in the front of your thigh and knee. 4. Hold this position for __________ seconds. Repeat __________ times. Complete this stretch __________ times a day. Exercise B: Iliotibial band  1. Lie on your side with your left / right leg in the top position. 2. Bend both of your knees and grab your left / right ankle. Stretch out your bottom arm to help you balance. 3. Slowly bring your top knee back so your thigh goes behind your trunk. 4. Slowly lower your top leg toward the floor until you feel a gentle stretch on the outside of your left / right hip and thigh. If you do not feel a stretch and your knee will not fall farther, place the heel of your other foot on top of your knee and pull your knee down toward the floor with your foot. 5. Hold this position for __________ seconds. Repeat __________ times. Complete this stretch __________ times a day. Strengthening exercises These exercises build strength and endurance in your hip and pelvis. Endurance is the  ability to use your muscles for a long time, even after they get tired. Exercise C: Straight leg raises ( hip abductors) 1. Lie on your side with your left / right leg in the top position. Lie so your head, shoulder, knee, and hip line up. You may bend your bottom knee to help you balance. 2. Roll your hips slightly forward so your hips are stacked directly over each other and your left / right knee is facing forward. 3. Tense the muscles in your outer thigh and lift your top leg 4-6 inches (10-15 cm). 4. Hold this position for __________ seconds. 5. Slowly return to the starting position. Let your muscles relax completely before doing another repetition. Repeat __________ times. Complete this exercise __________ times a day. Exercise D: Straight leg raises ( hip extensors) 1. Lie on your abdomen on your bed or a firm surface. You can put a pillow under your hips if that is more comfortable. 2. Bend your left / right knee so your foot is straight up in the air. 3. Squeeze your buttock muscles and lift your left / right thigh off the bed. Do not let your back arch. 4. Tense this muscle as hard as you can without increasing any knee pain. 5. Hold this position for __________ seconds. 6. Slowly lower your leg to the starting position and allow it to relax completely. Repeat __________ times. Complete this exercise __________ times a day. Exercise E: Hip   hike 1. Stand sideways on a bottom step. Stand on your left / right leg with your other foot unsupported next to the step. You can hold onto the railing or wall if needed for balance. 2. Keep your knees straight and your torso square. Then, lift your left / right hip up toward the ceiling. 3. Slowly let your left / right hip lower toward the floor, past the starting position. Your foot should get closer to the floor. Do not lean or bend your knees. Repeat __________ times. Complete this exercise __________ times a day. This information is not  intended to replace advice given to you by your health care provider. Make sure you discuss any questions you have with your health care provider. Document Released: 01/07/2005 Document Revised: 09/12/2015 Document Reviewed: 12/09/2014 Elsevier Interactive Patient Education  2018 [employer] Shoulder Exercises Ask your health care provider which exercises are safe for you. Do exercises exactly as told by your health care provider and adjust them as directed. It is normal to feel mild stretching, pulling, tightness, or discomfort as you do these exercises, but you should stop right away if you feel sudden pain or your pain gets worse.Do not begin these exercises until told by your health care provider. RANGE OF MOTION EXERCISES These exercises warm up your muscles and joints and improve the movement and flexibility of your shoulder. These exercises also help to relieve pain, numbness, and tingling. These exercises involve stretching your injured shoulder directly. Exercise A: Pendulum  1. Stand near a wall or a surface that you can hold onto for balance. 2. Bend at the waist and let your left / right arm hang straight down. Use your other arm to support you. Keep your back straight and do not lock your knees. 3. Relax your left / right arm and shoulder muscles, and move your hips and your trunk so your left / right arm swings freely. Your arm should swing because of the motion of your body, not because you are using your arm or shoulder muscles. 4. Keep moving your body so your arm swings in the following directions, as told by your health care provider: ? Side to side. ? Forward and backward. ? In clockwise and counterclockwise circles. 5. Continue each motion for __________ seconds, or for as long as told by your health care provider. 6. Slowly return to the starting position. Repeat __________ times. Complete this exercise __________ times a day. Exercise B:Flexion, Standing  1. Stand and  hold a broomstick, a cane, or a similar object. Place your hands a little more than shoulder-width apart on the object. Your left / right hand should be palm-up, and your other hand should be palm-down. 2. Keep your elbow straight and keep your shoulder muscles relaxed. Push the stick down with your healthy arm to raise your left / right arm in front of your body, and then over your head until you feel a stretch in your shoulder. ? Avoid shrugging your shoulder while you raise your arm. Keep your shoulder blade tucked down toward the middle of your back. 3. Hold for __________ seconds. 4. Slowly return to the starting position. Repeat __________ times. Complete this exercise __________ times a day. Exercise C: Abduction, Standing 1. Stand and hold a broomstick, a cane, or a similar object. Place your hands a little more than shoulder-width apart on the object. Your left / right hand should be palm-up, and your other hand should be palm-down. 2. While keeping your elbow   straight and your shoulder muscles relaxed, push the stick across your body toward your left / right side. Raise your left / right arm to the side of your body and then over your head until you feel a stretch in your shoulder. ? Do not raise your arm above shoulder height, unless your health care provider tells you to do that. ? Avoid shrugging your shoulder while you raise your arm. Keep your shoulder blade tucked down toward the middle of your back. 3. Hold for __________ seconds. 4. Slowly return to the starting position. Repeat __________ times. Complete this exercise __________ times a day. Exercise D:Internal Rotation  1. Place your left / right hand behind your back, palm-up. 2. Use your other hand to dangle an exercise band, a towel, or a similar object over your shoulder. Grasp the band with your left / right hand so you are holding onto both ends. 3. Gently pull up on the band until you feel a stretch in the front of your  left / right shoulder. ? Avoid shrugging your shoulder while you raise your arm. Keep your shoulder blade tucked down toward the middle of your back. 4. Hold for __________ seconds. 5. Release the stretch by letting go of the band and lowering your hands. Repeat __________ times. Complete this exercise __________ times a day. STRETCHING EXERCISES These exercises warm up your muscles and joints and improve the movement and flexibility of your shoulder. These exercises also help to relieve pain, numbness, and tingling. These exercises are done using your healthy shoulder to help stretch the muscles of your injured shoulder. Exercise E: Corner Stretch (External Rotation and Abduction)  1. Stand in a doorway with one of your feet slightly in front of the other. This is called a staggered stance. If you cannot reach your forearms to the door frame, stand facing a corner of a room. 2. Choose one of the following positions as told by your health care provider: ? Place your hands and forearms on the door frame above your head. ? Place your hands and forearms on the door frame at the height of your head. ? Place your hands on the door frame at the height of your elbows. 3. Slowly move your weight onto your front foot until you feel a stretch across your chest and in the front of your shoulders. Keep your head and chest upright and keep your abdominal muscles tight. 4. Hold for __________ seconds. 5. To release the stretch, shift your weight to your back foot. Repeat __________ times. Complete this stretch __________ times a day. Exercise F:Extension, Standing 1. Stand and hold a broomstick, a cane, or a similar object behind your back. ? Your hands should be a little wider than shoulder-width apart. ? Your palms should face away from your back. 2. Keeping your elbows straight and keeping your shoulder muscles relaxed, move the stick away from your body until you feel a stretch in your  shoulder. ? Avoid shrugging your shoulders while you move the stick. Keep your shoulder blade tucked down toward the middle of your back. 3. Hold for __________ seconds. 4. Slowly return to the starting position. Repeat __________ times. Complete this exercise __________ times a day. STRENGTHENING EXERCISES These exercises build strength and endurance in your shoulder. Endurance is the ability to use your muscles for a long time, even after they get tired. Exercise G:External Rotation  1. Sit in a stable chair without armrests. 2. Secure an exercise band at elbow height   on your left / right side. 3. Place a soft object, such as a folded towel or a small pillow, between your left / right upper arm and your body to move your elbow a few inches away (about 10 cm) from your side. 4. Hold the end of the band so it is tight and there is no slack. 5. Keeping your elbow pressed against the soft object, move your left / right forearm out, away from your abdomen. Keep your body steady so only your forearm moves. 6. Hold for __________ seconds. 7. Slowly return to the starting position. Repeat __________ times. Complete this exercise __________ times a day. Exercise H:Shoulder Abduction  1. Sit in a stable chair without armrests, or stand. 2. Hold a __________ weight in your left / right hand, or hold an exercise band with both hands. 3. Start with your arms straight down and your left / right palm facing in, toward your body. 4. Slowly lift your left / right hand out to your side. Do not lift your hand above shoulder height unless your health care provider tells you that this is safe. ? Keep your arms straight. ? Avoid shrugging your shoulder while you do this movement. Keep your shoulder blade tucked down toward the middle of your back. 5. Hold for __________ seconds. 6. Slowly lower your arm, and return to the starting position. Repeat __________ times. Complete this exercise __________ times a  day. Exercise I:Shoulder Extension 1. Sit in a stable chair without armrests, or stand. 2. Secure an exercise band to a stable object in front of you where it is at shoulder height. 3. Hold one end of the exercise band in each hand. Your palms should face each other. 4. Straighten your elbows and lift your hands up to shoulder height. 5. Step back, away from the secured end of the exercise band, until the band is tight and there is no slack. 6. Squeeze your shoulder blades together as you pull your hands down to the sides of your thighs. Stop when your hands are straight down by your sides. Do not let your hands go behind your body. 7. Hold for __________ seconds. 8. Slowly return to the starting position. Repeat __________ times. Complete this exercise __________ times a day. Exercise J:Standing Shoulder Row 1. Sit in a stable chair without armrests, or stand. 2. Secure an exercise band to a stable object in front of you so it is at waist height. 3. Hold one end of the exercise band in each hand. Your palms should be in a thumbs-up position. 4. Bend each of your elbows to an "L" shape (about 90 degrees) and keep your upper arms at your sides. 5. Step back until the band is tight and there is no slack. 6. Slowly pull your elbows back behind you. 7. Hold for __________ seconds. 8. Slowly return to the starting position. Repeat __________ times. Complete this exercise __________ times a day. Exercise K:Shoulder Press-Ups  1. Sit in a stable chair that has armrests. Sit upright, with your feet flat on the floor. 2. Put your hands on the armrests so your elbows are bent and your fingers are pointing forward. Your hands should be about even with the sides of your body. 3. Push down on the armrests and use your arms to lift yourself off of the chair. Straighten your elbows and lift yourself up as much as you comfortably can. ? Move your shoulder blades down, and avoid letting your shoulders  move   up toward your ears. ? Keep your feet on the ground. As you get stronger, your feet should support less of your body weight as you lift yourself up. 4. Hold for __________ seconds. 5. Slowly lower yourself back into the chair. Repeat __________ times. Complete this exercise __________ times a day. Exercise L: Wall Push-Ups  1. Stand so you are facing a stable wall. Your feet should be about one arm-length away from the wall. 2. Lean forward and place your palms on the wall at shoulder height. 3. Keep your feet flat on the floor as you bend your elbows and lean forward toward the wall. 4. Hold for __________ seconds. 5. Straighten your elbows to push yourself back to the starting position. Repeat __________ times. Complete this exercise __________ times a day. This information is not intended to replace advice given to you by your health care provider. Make sure you discuss any questions you have with your health care provider. Document Released: 11/21/2004 Document Revised: 10/02/2015 Document Reviewed: 09/18/2014 Elsevier Interactive Patient Education  2018 [employer]  

## 2017-02-04 NOTE — Progress Notes (Signed)
Rodney Burns Date of Birth: 1945/06/19   History of Present Illness: Rodney Burns is seen for followup of atrial flutter and CAD. He has a history of atrial flutter and has had multiple cardioversions. He had radiofrequency ablation in March and then again in August of 2013. He had recurrent atrial flutter and has been treated with rate control and anticoagulation.  He has a history of coronary disease and is status post CABG in 1994. He had a nuclear stress test in March 2016 which showed an inferior and anteroapical infarct without evidence of ischemia. Ejection fraction was 32%.  He also has a history of CLL.   He was admitted in January 2017 with acute pleuritic chest pain. Clinically consistent with pericarditis although Ecg and Echo unremarkable. He did respond to anti-inflammatory therapy with ibuprofen and colchicine. No recurrent pain. These meds have been discontinued.   On follow up  he feels well. He denies any chest pain. Only SOB walking up a steep incline.  No edema. No palpitations or dizziness. Energy level is OK. Active playing golf and bowling. At one point he cut losartan back due to low BP but then states his BP shot up.  Current Outpatient Medications on File Prior to Visit  Medication Sig Dispense Refill  . CHERRY PO Take by mouth daily.    [name] Calcium (STOOL SOFTENER PO) Take by mouth 2 (two) times daily.    [name] ezetimibe-simvastatin (VYTORIN) 10-20 MG tablet TAKE 1 TABLET AT BEDTIME 90 tablet 3  . Fiber, Guar Gum, CHEW Chew 1 capsule by mouth daily.    [name] latanoprost (XALATAN) 0.005 % ophthalmic solution Place 1 drop into both eyes at bedtime.     [name] losartan (COZAAR) 50 MG tablet TAKE 1 TABLET DAILY (KEEP OFFICE VISIT) 90 tablet 3  . Magnesium 250 MG TABS Take 500 mg by mouth at bedtime.    . pramipexole (MIRAPEX) 0.5 MG tablet Take 0.5 mg by mouth daily.    . RABEprazole (ACIPHEX) 20 MG tablet Take 1 tablet (20 mg total) by mouth daily. 90 tablet 3  . TOPROL XL 25 MG 24  hr tablet TAKE 1 TABLET DAILY 90 tablet 2  . XARELTO 20 MG TABS tablet TAKE 1 TABLET DAILY WITH SUPPER 90 tablet 2   No current facility-administered medications on file prior to visit.     Allergies  Allergen Reactions  . Penicillins Other (See Comments)    Put pt in hospital 50 yrs ago  . Ace Inhibitors Cough  . Darvocet [Propoxyphene N-Acetaminophen] Rash    Tolerates tylenol  . Sulfa Antibiotics Rash    Past Medical History:  Diagnosis Date  . Atrial tachycardia ([place])    a. s/p DCCV 09/2009; b. s/p RFCA 03/2011; c. s/p DCCV 04/2011; d. s/p RFCA 09/17/11.  . Cataract   . Chronic systolic CHF (congestive heart failure) ([place])    a. 01/2015 Echo: EF 40-45%, antsept/infsept HK, triv AI, mild MR, mod dil LA, nl RV, mildly dil RA.  [name] CLL (chronic lymphoblastic leukemia)    Stage 0-1  . Coronary artery disease involving native coronary artery without angina pectoris    a. Status post CABG in 1994:By Dr. Cyndia Bent. LIMA - L Cx, seqSVG-Diag-LAD, and SVG-PDA-PL.  [name] Cough    Consistant with ACE inhibitor mediated cough  . Glaucoma (increased eye pressure) 1991  . History of tobacco abuse    quit 1994  . Hypercholesterolemia    Well Controlled  . Hypertension   .  Ischemic cardiomyopathy    a. 02/2011 Echo: EF 40-45%;  b. 01/2015 Echo: EF 40-45%.  . Malaria 1972   Hx of  . Pericarditis    a. 01/2015-->Treated w/ ibuprofen/colchicine.  . Sleep-disordered breathing 06/20/2011    Past Surgical History:  Procedure Laterality Date  . ATRIAL FLUTTER ABLATION N/A 04/11/2011   Procedure: ATRIAL FLUTTER ABLATION;  Surgeon: Deboraha Sprang, MD;  Location: [facility] CATH LAB;  Service: Cardiovascular;  Laterality: N/A;  . ATRIAL FLUTTER ABLATION N/A 09/17/2011   Procedure: ATRIAL FLUTTER ABLATION;  Surgeon: Thompson Grayer, MD;  Location: [facility] CATH LAB;  Service: Cardiovascular;  Laterality: N/A;  . CARDIOVERSION  06/03/2011   Procedure: CARDIOVERSION;  Surgeon: Deboraha Sprang, MD;  Location: [facility];  Service:  Cardiovascular;  Laterality: N/A;  . [place]   By Dr. Cyndia Bent. LIMA - L Cx, seqSVG-Diag-LAD, and SVG-PDA-PL  . ELECTROPHYSIOLOGY STUDY N/A 04/11/2011   Procedure: ELECTROPHYSIOLOGY STUDY;  Surgeon: Deboraha Sprang, MD;  Location: [facility] CATH LAB;  Service: Cardiovascular;  Laterality: N/A;  . EP study and ablation  2013   by Dr Caryl Comes, repeat ablation by Dr Rayann Heman  . NM MYOVIEW LTD  03/2014   scar in the inferior and anteroapical regions without ischemia. This is similar to finding on Myoview in 2013. EF is a little lower 39%>>32%.  [name] TRIGGER FINGER RELEASE  12/2016   left hand   . US ECHOCARDIOGRAPHY  09-07-09; 02/2011   a. Est EF 40-45%; b. EF 40-45%. Gr 2 DD. Mild LA dil    Social History   Tobacco Use  Smoking Status Former Smoker  . Last attempt to quit: 01/22/1992  . Years since quitting: 25.0  Smokeless Tobacco Never Used    Social History   Substance and Sexual Activity  Alcohol Use No    Family History  Problem Relation Age of Onset  . Heart failure Father   . Cancer Brother        colon  . Down syndrome Son     Review of Systems: As noted in history of present illness.  All other systems were reviewed and are negative.  Physical Exam: BP 98/62 (BP Location: Left Arm, Patient Position: Sitting, Cuff Size: Large)   Pulse 78   Ht 5\' 10"  (1.778 m)   Wt 214 lb (97.1 kg)   BMI 30.71 kg/m  GENERAL:  Well appearing WM HEENT:  PERRL, EOMI, sclera are clear. Oropharynx is clear. NECK:  No jugular venous distention, carotid upstroke brisk and symmetric, no bruits, no thyromegaly or adenopathy LUNGS:  Clear to auscultation bilaterally CHEST:  Unremarkable HEART:  IRRR,  PMI not displaced or sustained,S1 and S2 within normal limits, no S3, no S4: no clicks, no rubs, no murmurs ABD:  Soft, nontender. BS +, no masses or bruits. No hepatomegaly, no splenomegaly EXT:  2 + pulses throughout, no edema, no cyanosis no clubbing SKIN:  Warm and dry.  No  rashes NEURO:  Alert and oriented x 3. Cranial nerves II through XII intact. PSYCH:  Cognitively intact    LABORATORY DATA: Lab Results  Component Value Date   WBC 20.6 (H) 08/22/2016   HGB 13.8 08/22/2016   HCT 44.1 08/22/2016   PLT 128 (L) 08/22/2016   GLUCOSE 98 08/22/2016   ALT 23 08/22/2016   AST 22 08/22/2016   NA 141 08/22/2016   K 4.5 08/22/2016   CL 111 02/16/2015   CREATININE 0.9 08/22/2016   BUN 16.6 08/22/2016  CO2 28 08/22/2016   INR 1.5 (H) 04/09/2011   Labs dated 02/23/16: cholesterol 128, triglycerides 59, HDL 50, LDL 65. Glucose 114. Other chemistries normal.   Assessment / Plan: 1. Recurrent atrial flutter. status post radiofrequency ablation x2. Previously felt to be chronic but when seen in June he was in NSR. He is clearly in [place] today. Rate is well controlled.  He  is asymptomatic.  Continue metoprolol for rate control.  Continue anticoagulation with Xarelto 20 mg daily.   2. Coronary disease status post CABG in 1994. Patient is asymptomatic. Continue  beta blocker therapy. Myvoview study in March 2016 showed inferior and anteroapical scar without ischemia. EF 32%. Findings similar to prior Myoview in 2013.   3. Chronic congestive heart failure. Ejection fraction by Echo in January 2017 improved to 40-45%.  No evidence of volume overload. Continue ARB and beta blocker therapy.   4. CLL stage 0. Followed by oncology.  5. Hypertension, controlled.   6. Thrombocytopenia.  7. Hyperlipidemia. On Vytorin Excellent control last year. Stopped taking niacin. Seeing primary care soon for lab work.  followup in 6 months.

## 2017-02-06 ENCOUNTER — Encounter: Payer: Self-pay | Admitting: Cardiology

## 2017-02-06 ENCOUNTER — Ambulatory Visit (INDEPENDENT_AMBULATORY_CARE_PROVIDER_SITE_OTHER): Payer: Medicare Other | Admitting: Cardiology

## 2017-02-06 VITALS — BP 98/62 | HR 78 | Ht 70.0 in | Wt 214.0 lb

## 2017-02-06 DIAGNOSIS — I25810 Atherosclerosis of coronary artery bypass graft(s) without angina pectoris: Secondary | ICD-10-CM

## 2017-02-06 DIAGNOSIS — I255 Ischemic cardiomyopathy: Secondary | ICD-10-CM | POA: Diagnosis not present

## 2017-02-06 DIAGNOSIS — I10 Essential (primary) hypertension: Secondary | ICD-10-CM | POA: Diagnosis not present

## 2017-02-06 DIAGNOSIS — Z951 Presence of aortocoronary bypass graft: Secondary | ICD-10-CM

## 2017-02-06 DIAGNOSIS — I5022 Chronic systolic (congestive) heart failure: Secondary | ICD-10-CM

## 2017-02-06 DIAGNOSIS — I484 Atypical atrial flutter: Secondary | ICD-10-CM

## 2017-02-06 NOTE — Patient Instructions (Signed)
Continue your current therapy  I will see you in 6 months.   

## 2017-02-20 NOTE — Assessment & Plan Note (Signed)
CLL stage I, lymphocytosis and lymphadenopathy diagnosed 07/31/2009, initial CT scan revealed lymphadenopathy and mildly enlarged spleen.  Treatment plan: Observation. I discussed with him the indications for treatment would be rapid doubling time on development of severe anemia thrombocytopenia, rapidly enlarging lymphadenopathy, B symptoms like fevers chills night sweats and weight loss.  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  His platelet count today is 128 which is the best it has been in a long time. He does not have any significant anemia.   Return to clinic in 6 months for follow-up with blood tests.

## 2017-02-21 ENCOUNTER — Other Ambulatory Visit: Payer: Self-pay

## 2017-02-21 ENCOUNTER — Inpatient Hospital Stay: Payer: Medicare Other

## 2017-02-21 ENCOUNTER — Telehealth: Payer: Self-pay | Admitting: Hematology and Oncology

## 2017-02-21 ENCOUNTER — Inpatient Hospital Stay: Payer: Medicare Other | Attending: Hematology and Oncology | Admitting: Hematology and Oncology

## 2017-02-21 DIAGNOSIS — Z79899 Other long term (current) drug therapy: Secondary | ICD-10-CM | POA: Diagnosis not present

## 2017-02-21 DIAGNOSIS — Z7901 Long term (current) use of anticoagulants: Secondary | ICD-10-CM | POA: Diagnosis not present

## 2017-02-21 DIAGNOSIS — C9190 Lymphoid leukemia, unspecified not having achieved remission: Secondary | ICD-10-CM | POA: Insufficient documentation

## 2017-02-21 DIAGNOSIS — I25810 Atherosclerosis of coronary artery bypass graft(s) without angina pectoris: Secondary | ICD-10-CM

## 2017-02-21 DIAGNOSIS — C9110 Chronic lymphocytic leukemia of B-cell type not having achieved remission: Secondary | ICD-10-CM

## 2017-02-21 DIAGNOSIS — J069 Acute upper respiratory infection, unspecified: Secondary | ICD-10-CM | POA: Diagnosis not present

## 2017-02-21 DIAGNOSIS — D696 Thrombocytopenia, unspecified: Secondary | ICD-10-CM | POA: Diagnosis not present

## 2017-02-21 LAB — CBC WITH DIFFERENTIAL/PLATELET
BASOS ABS: 0.1 10*3/uL (ref 0.0–0.1)
BASOS PCT: 0 %
Eosinophils Absolute: 0.2 10*3/uL (ref 0.0–0.5)
Eosinophils Relative: 1 %
HEMATOCRIT: 46.1 % (ref 38.4–49.9)
HEMOGLOBIN: 14.6 g/dL (ref 13.0–17.1)
LYMPHS PCT: 76 %
Lymphs Abs: 23.1 10*3/uL — ABNORMAL HIGH (ref 0.9–3.3)
MCH: 26.5 pg — ABNORMAL LOW (ref 27.2–33.4)
MCHC: 31.6 g/dL — ABNORMAL LOW (ref 32.0–36.0)
MCV: 83.8 fL (ref 79.3–98.0)
MONO ABS: 0.8 10*3/uL (ref 0.1–0.9)
Monocytes Relative: 3 %
NEUTROS ABS: 5.9 10*3/uL (ref 1.5–6.5)
NEUTROS PCT: 20 %
Platelets: 129 10*3/uL — ABNORMAL LOW (ref 140–400)
RBC: 5.5 MIL/uL (ref 4.20–5.82)
RDW: 15.8 % — ABNORMAL HIGH (ref 11.0–14.6)
WBC: 30 10*3/uL — AB (ref 4.0–10.3)

## 2017-02-21 LAB — COMPREHENSIVE METABOLIC PANEL
ALT: 33 U/L (ref 0–55)
ANION GAP: 7 (ref 3–11)
AST: 20 U/L (ref 5–34)
Albumin: 4.4 g/dL (ref 3.5–5.0)
Alkaline Phosphatase: 41 U/L (ref 40–150)
BUN: 15 mg/dL (ref 7–26)
CHLORIDE: 103 mmol/L (ref 98–109)
CO2: 28 mmol/L (ref 22–29)
Calcium: 9.7 mg/dL (ref 8.4–10.4)
Creatinine, Ser: 0.87 mg/dL (ref 0.70–1.30)
GFR calc Af Amer: >60 mL/min (ref >60)
Glucose, Bld: 105 mg/dL (ref 70–140)
POTASSIUM: 4.7 mmol/L (ref 3.5–5.1)
Sodium: 138 mmol/L (ref 136–145)
TOTAL PROTEIN: 7.1 g/dL (ref 6.4–8.3)
Total Bilirubin: 1.1 mg/dL (ref 0.2–1.2)

## 2017-02-21 LAB — LACTATE DEHYDROGENASE: LDH: 182 U/L (ref 125–245)

## 2017-02-21 LAB — CHOLESTEROL, TOTAL: Cholesterol: 142 mg/dL (ref 0–200)

## 2017-02-21 NOTE — Progress Notes (Signed)
Patient Care Team: Hulan Fess, MD as PCP - General (Family Medicine) [place], [name], MD as PCP - Cardiology (Cardiology)  DIAGNOSIS:  Encounter Diagnosis  Name Primary?  . CLL (chronic lymphocytic leukemia) ([place])     CHIEF COMPLIANT: Follow-up of CLL  INTERVAL HISTORY: Rodney Burns is a 72 year old with above-mentioned history of CLL who is here for 73-month follow-up checkup.  He reports that he has an upper respiratory infection but otherwise she is doing quite well.  He does not have any fevers chills night sweats or weight loss.  REVIEW OF SYSTEMS:   Constitutional: Denies fevers, chills or abnormal weight loss Eyes: Denies blurriness of vision Ears, nose, mouth, throat, and face: Denies mucositis or sore throat Respiratory: Denies cough, dyspnea or wheezes Cardiovascular: Denies palpitation, chest discomfort Gastrointestinal:  Denies nausea, heartburn or change in bowel habits Skin: Denies abnormal skin rashes Lymphatics: Denies new lymphadenopathy or easy bruising Neurological:Denies numbness, tingling or new weaknesses Behavioral/Psych: Mood is stable, no new changes  Extremities: No lower extremity edema  All other systems were reviewed with the patient and are negative.  I have reviewed the past medical history, past surgical history, social history and family history with the patient and they are unchanged from previous note.  ALLERGIES:  is allergic to penicillins; ace inhibitors; darvocet [propoxyphene n-acetaminophen]; and sulfa antibiotics.  MEDICATIONS:  Current Outpatient Medications  Medication Sig Dispense Refill  . CHERRY PO Take by mouth daily.    [name] Calcium (STOOL SOFTENER PO) Take by mouth 2 (two) times daily.    [name] ezetimibe-simvastatin (VYTORIN) 10-20 MG tablet TAKE 1 TABLET AT BEDTIME 90 tablet 3  . Fiber, Guar Gum, CHEW Chew 1 capsule by mouth daily.    [name] latanoprost (XALATAN) 0.005 % ophthalmic solution Place 1 drop into both eyes at  bedtime.     [name] losartan (COZAAR) 50 MG tablet TAKE 1 TABLET DAILY (KEEP OFFICE VISIT) 90 tablet 3  . Magnesium 250 MG TABS Take 500 mg by mouth at bedtime.    . pramipexole (MIRAPEX) 0.5 MG tablet Take 0.5 mg by mouth daily.    . RABEprazole (ACIPHEX) 20 MG tablet Take 1 tablet (20 mg total) by mouth daily. 90 tablet 3  . TOPROL XL 25 MG 24 hr tablet TAKE 1 TABLET DAILY 90 tablet 2  . XARELTO 20 MG TABS tablet TAKE 1 TABLET DAILY WITH SUPPER 90 tablet 2   No current facility-administered medications for this visit.     PHYSICAL EXAMINATION: ECOG PERFORMANCE STATUS: 1 - Symptomatic but completely ambulatory  Vitals:   02/21/17 1010  BP: 128/76  Pulse: 91  Resp: 18  Temp: 98.5 F (36.9 C)  SpO2: 97%   Filed Weights   02/21/17 1010  Weight: 216 lb 8 oz (98.2 kg)    GENERAL:alert, no distress and comfortable SKIN: skin color, texture, turgor are normal, no rashes or significant lesions EYES: normal, Conjunctiva are pink and non-injected, sclera clear OROPHARYNX:no exudate, no erythema and lips, buccal mucosa, and tongue normal  NECK: supple, thyroid normal size, non-tender, without nodularity LYMPH:  no palpable lymphadenopathy in the cervical, axillary or inguinal LUNGS: clear to auscultation and percussion with normal breathing effort HEART: regular rate & rhythm and no murmurs and no lower extremity edema ABDOMEN:abdomen soft, non-tender and normal bowel sounds MUSCULOSKELETAL:no cyanosis of digits and no clubbing  NEURO: alert & oriented x 3 with fluent speech, no focal motor/sensory deficits EXTREMITIES: No lower extremity edema  LABORATORY DATA:  I have reviewed the data as listed CMP Latest Ref Rng & Units 02/21/2017 08/22/2016 02/15/2016  Glucose 70 - 140 mg/dL 105 98 136  BUN 7 - 26 mg/dL 15 16.6 11.5  Creatinine 0.70 - 1.30 mg/dL 0.87 0.9 0.9  Sodium 136 - 145 mmol/L 138 141 138  Potassium 3.5 - 5.1 mmol/L 4.7 4.5 4.6  Chloride 98 - 109 mmol/L 103 - -  CO2 22 - 29  mmol/L 28 28 25   Calcium 8.4 - 10.4 mg/dL 9.7 9.5 9.5  Total Protein 6.4 - 8.3 g/dL 7.1 6.6 6.5  Total Bilirubin 0.2 - 1.2 mg/dL 1.1 0.82 1.14  Alkaline Phos 40 - 150 U/L 41 46 52  AST 5 - 34 U/L 20 22 28   ALT 0 - 55 U/L 33 23 41    Lab Results  Component Value Date   WBC 30.0 (H) 02/21/2017   HGB 14.6 02/21/2017   HCT 46.1 02/21/2017   MCV 83.8 02/21/2017   PLT 129 (L) 02/21/2017   NEUTROABS 5.9 02/21/2017    ASSESSMENT & PLAN:  CLL (chronic lymphocytic leukemia) (HCC) CLL stage I, lymphocytosis and lymphadenopathy diagnosed 07/31/2009, initial CT scan revealed lymphadenopathy and mildly enlarged spleen. His absolute lymphocyte count has increased to 23.1 which is an increase from 18 previously. Platelet count 129 stable Hemoglobin 14.6 stable  Patient understands that his lymphocyte count will continue to fluctuate up and down.  Treatment plan: Observation. I discussed with him the indications for treatment would be rapid doubling time on development of severe anemia thrombocytopenia, rapidly enlarging lymphadenopathy, B symptoms like fevers chills night sweats and weight loss.  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  His platelet count today is 129 which is stable. He does not have any significant anemia.   Return to clinic in 6 months for follow-up with blood tests.    I spent 25 minutes talking to the patient of which more than half was spent in counseling and coordination of care.  Orders Placed This Encounter  Procedures  . CBC with Differential (Cancer Center Only)    Standing Status:   Future    Standing Expiration Date:   02/21/2018  . CMP ([place] only)    Standing Status:   Future    Standing Expiration Date:   02/21/2018  . Lactate dehydrogenase (LDH)    Standing Status:   Future    Standing Expiration Date:   02/21/2018   The patient has a good understanding of the overall plan. he agrees with it. he will call with any problems that may  develop before the next visit here.   Harriette Ohara, MD 02/21/17

## 2017-02-21 NOTE — Telephone Encounter (Signed)
Gave patient AVS and calendar of upcoming august appointments °

## 2017-02-27 ENCOUNTER — Ambulatory Visit: Payer: Self-pay | Admitting: Rheumatology

## 2017-03-20 DIAGNOSIS — E785 Hyperlipidemia, unspecified: Secondary | ICD-10-CM | POA: Diagnosis not present

## 2017-03-20 DIAGNOSIS — Z0000 Encounter for general adult medical examination without abnormal findings: Secondary | ICD-10-CM | POA: Diagnosis not present

## 2017-03-20 DIAGNOSIS — D696 Thrombocytopenia, unspecified: Secondary | ICD-10-CM | POA: Diagnosis not present

## 2017-03-20 DIAGNOSIS — R7301 Impaired fasting glucose: Secondary | ICD-10-CM | POA: Diagnosis not present

## 2017-03-20 DIAGNOSIS — C9110 Chronic lymphocytic leukemia of B-cell type not having achieved remission: Secondary | ICD-10-CM | POA: Diagnosis not present

## 2017-03-20 DIAGNOSIS — Z125 Encounter for screening for malignant neoplasm of prostate: Secondary | ICD-10-CM | POA: Diagnosis not present

## 2017-03-20 DIAGNOSIS — R05 Cough: Secondary | ICD-10-CM | POA: Diagnosis not present

## 2017-03-20 DIAGNOSIS — I4892 Unspecified atrial flutter: Secondary | ICD-10-CM | POA: Diagnosis not present

## 2017-03-20 DIAGNOSIS — I10 Essential (primary) hypertension: Secondary | ICD-10-CM | POA: Diagnosis not present

## 2017-03-20 DIAGNOSIS — G2581 Restless legs syndrome: Secondary | ICD-10-CM | POA: Diagnosis not present

## 2017-03-20 DIAGNOSIS — I2510 Atherosclerotic heart disease of native coronary artery without angina pectoris: Secondary | ICD-10-CM | POA: Diagnosis not present

## 2017-03-20 DIAGNOSIS — Z86010 Personal history of colonic polyps: Secondary | ICD-10-CM | POA: Diagnosis not present

## 2017-03-27 ENCOUNTER — Ambulatory Visit: Payer: Self-pay | Admitting: Rheumatology

## 2017-03-27 DIAGNOSIS — D3132 Benign neoplasm of left choroid: Secondary | ICD-10-CM | POA: Diagnosis not present

## 2017-03-27 DIAGNOSIS — H401112 Primary open-angle glaucoma, right eye, moderate stage: Secondary | ICD-10-CM | POA: Diagnosis not present

## 2017-03-27 DIAGNOSIS — H35033 Hypertensive retinopathy, bilateral: Secondary | ICD-10-CM | POA: Diagnosis not present

## 2017-03-27 DIAGNOSIS — H401121 Primary open-angle glaucoma, left eye, mild stage: Secondary | ICD-10-CM | POA: Diagnosis not present

## 2017-04-08 DIAGNOSIS — D1801 Hemangioma of skin and subcutaneous tissue: Secondary | ICD-10-CM | POA: Diagnosis not present

## 2017-04-08 DIAGNOSIS — L821 Other seborrheic keratosis: Secondary | ICD-10-CM | POA: Diagnosis not present

## 2017-04-28 ENCOUNTER — Ambulatory Visit: Payer: Self-pay | Admitting: Rheumatology

## 2017-05-06 ENCOUNTER — Other Ambulatory Visit: Payer: Self-pay | Admitting: Cardiology

## 2017-05-08 ENCOUNTER — Other Ambulatory Visit: Payer: Self-pay | Admitting: *Deleted

## 2017-05-08 MED ORDER — TRAMADOL HCL 50 MG PO TABS: 50.0000 mg | ORAL_TABLET | Freq: Four times a day (QID) | ORAL | 0 refills | Status: DC | PRN

## 2017-05-28 ENCOUNTER — Other Ambulatory Visit: Payer: Self-pay | Admitting: Cardiology

## 2017-05-28 NOTE — Telephone Encounter (Signed)
Rx(s) sent to pharmacy electronically.  

## 2017-06-19 ENCOUNTER — Other Ambulatory Visit: Payer: Self-pay

## 2017-06-19 DIAGNOSIS — C9110 Chronic lymphocytic leukemia of B-cell type not having achieved remission: Secondary | ICD-10-CM

## 2017-06-19 MED ORDER — TRAMADOL HCL 50 MG PO TABS: 50.0000 mg | ORAL_TABLET | Freq: Four times a day (QID) | ORAL | 3 refills | Status: DC | PRN

## 2017-06-19 NOTE — Telephone Encounter (Signed)
Patient stopped by office in regards to his Tramadol refill. He received a call from his pharmacy and this last time, prescription didn't have any refills from another provider here.   Pt requested a prescription like he has received in past with refills from Dr [name].  Noted in the past there were 3 refills. Submitted refill to Dr [name] to complete. Pt request it be faxed to his [facility].  No other needs per pt at this time. His next appt is in August 2019.

## 2017-07-06 ENCOUNTER — Other Ambulatory Visit: Payer: Self-pay | Admitting: Cardiology

## 2017-08-21 ENCOUNTER — Inpatient Hospital Stay: Payer: Medicare Other

## 2017-08-21 ENCOUNTER — Inpatient Hospital Stay: Payer: Medicare Other | Attending: Hematology and Oncology | Admitting: Hematology and Oncology

## 2017-08-21 ENCOUNTER — Telehealth: Payer: Self-pay | Admitting: Hematology and Oncology

## 2017-08-21 DIAGNOSIS — Z7901 Long term (current) use of anticoagulants: Secondary | ICD-10-CM | POA: Diagnosis not present

## 2017-08-21 DIAGNOSIS — C9190 Lymphoid leukemia, unspecified not having achieved remission: Secondary | ICD-10-CM | POA: Diagnosis not present

## 2017-08-21 DIAGNOSIS — D696 Thrombocytopenia, unspecified: Secondary | ICD-10-CM | POA: Diagnosis not present

## 2017-08-21 DIAGNOSIS — Z79899 Other long term (current) drug therapy: Secondary | ICD-10-CM | POA: Diagnosis not present

## 2017-08-21 DIAGNOSIS — R161 Splenomegaly, not elsewhere classified: Secondary | ICD-10-CM | POA: Diagnosis not present

## 2017-08-21 DIAGNOSIS — C9110 Chronic lymphocytic leukemia of B-cell type not having achieved remission: Secondary | ICD-10-CM

## 2017-08-21 LAB — CMP (CANCER CENTER ONLY)
ALK PHOS: 40 U/L (ref 38–126)
ALT: 24 U/L (ref 0–44)
ANION GAP: 9 (ref 5–15)
AST: 19 U/L (ref 15–41)
Albumin: 4.1 g/dL (ref 3.5–5.0)
BILIRUBIN TOTAL: 0.7 mg/dL (ref 0.3–1.2)
BUN: 18 mg/dL (ref 8–23)
CALCIUM: 9.4 mg/dL (ref 8.9–10.3)
CO2: 26 mmol/L (ref 22–32)
CREATININE: 0.93 mg/dL (ref 0.61–1.24)
Chloride: 107 mmol/L (ref 98–111)
GFR, Estimated: >60 mL/min (ref >60)
GLUCOSE: 117 mg/dL — AB (ref 70–99)
Potassium: 4.5 mmol/L (ref 3.5–5.1)
SODIUM: 142 mmol/L (ref 135–145)
TOTAL PROTEIN: 6.5 g/dL (ref 6.5–8.1)

## 2017-08-21 LAB — CBC WITH DIFFERENTIAL (CANCER CENTER ONLY)
Basophils Absolute: 0 10*3/uL (ref 0.0–0.1)
Basophils Relative: 0 %
EOS ABS: 0.1 10*3/uL (ref 0.0–0.5)
Eosinophils Relative: 1 %
HEMATOCRIT: 46.2 % (ref 38.4–49.9)
HEMOGLOBIN: 14.5 g/dL (ref 13.0–17.1)
LYMPHS ABS: 15.8 10*3/uL — AB (ref 0.9–3.3)
LYMPHS PCT: 71 %
MCH: 27.1 pg — AB (ref 27.2–33.4)
MCHC: 31.4 g/dL — ABNORMAL LOW (ref 32.0–36.0)
MCV: 86.2 fL (ref 79.3–98.0)
MONOS PCT: 3 %
Monocytes Absolute: 0.7 10*3/uL (ref 0.1–0.9)
NEUTROS PCT: 25 %
Neutro Abs: 5.4 10*3/uL (ref 1.5–6.5)
Platelet Count: 139 10*3/uL — ABNORMAL LOW (ref 140–400)
RBC: 5.36 MIL/uL (ref 4.20–5.82)
RDW: 14.3 % (ref 11.0–14.6)
WBC Count: 22.1 10*3/uL — ABNORMAL HIGH (ref 4.0–10.3)

## 2017-08-21 LAB — LACTATE DEHYDROGENASE: LDH: 199 U/L — ABNORMAL HIGH (ref 98–192)

## 2017-08-21 NOTE — Assessment & Plan Note (Signed)
CLL stage I, lymphocytosis and lymphadenopathy diagnosed 07/31/2009, initial CT scan revealed lymphadenopathy and mildly enlarged spleen.  Lab review: His absolute lymphocyte count has increased to 23.1 which is an increase from 18 previously. Platelet count 129 stable Hemoglobin 14.6 stable  Patient understands that his lymphocyte count will continue to fluctuate up and down.  Treatment plan: Observation. I discussed with him the indications for treatment would be rapid doubling time on development of severe anemia thrombocytopenia, rapidly enlarging lymphadenopathy, B symptoms like fevers chills night sweats and weight loss.  Chronic thrombocytopenia: Probably low-grade ITP.His platelet count today is 129 which is stable.   Return to clinic in 6 months for follow-up with blood tests.

## 2017-08-21 NOTE — Telephone Encounter (Signed)
Patient decline avs and calendar °

## 2017-08-21 NOTE — Progress Notes (Signed)
Patient Care Team: Hulan Fess, MD as PCP - General (Family Medicine) [place], [name], MD as PCP - Cardiology (Cardiology)  DIAGNOSIS:  Encounter Diagnosis  Name Primary?  . CLL (chronic lymphocytic leukemia) ([place])    CHIEF COMPLIANT: Follow-up of CLL  INTERVAL HISTORY: Rodney Burns is a 72 year old with above-mentioned history of CLL who is here for a routine follow-up.  He has not noticed any new symptoms related to CLL.  He denies any fevers chills night sweats or weight loss.  REVIEW OF SYSTEMS:   Constitutional: Denies fevers, chills or abnormal weight loss Eyes: Denies blurriness of vision Ears, nose, mouth, throat, and face: Denies mucositis or sore throat Respiratory: Denies cough, dyspnea or wheezes Cardiovascular: Denies palpitation, chest discomfort Gastrointestinal:  Denies nausea, heartburn or change in bowel habits Skin: Denies abnormal skin rashes Lymphatics: Denies new lymphadenopathy or easy bruising Neurological:Denies numbness, tingling or new weaknesses Behavioral/Psych: Mood is stable, no new changes  Extremities: No lower extremity edema  All other systems were reviewed with the patient and are negative.  I have reviewed the past medical history, past surgical history, social history and family history with the patient and they are unchanged from previous note.  ALLERGIES:  is allergic to penicillins; ace inhibitors; darvocet [propoxyphene n-acetaminophen]; and sulfa antibiotics.  MEDICATIONS:  Current Outpatient Medications  Medication Sig Dispense Refill  . CHERRY PO Take by mouth daily.    [name] Calcium (STOOL SOFTENER PO) Take by mouth 2 (two) times daily.    [name] ezetimibe-simvastatin (VYTORIN) 10-20 MG tablet TAKE 1 TABLET AT BEDTIME 90 tablet 3  . Fiber, Guar Gum, CHEW Chew 1 capsule by mouth daily.    [name] latanoprost (XALATAN) 0.005 % ophthalmic solution Place 1 drop into both eyes at bedtime.     [name] losartan (COZAAR) 50 MG tablet TAKE 1  TABLET DAILY (KEEP OFFICE VISIT) 90 tablet 0  . Magnesium 250 MG TABS Take 500 mg by mouth at bedtime.    . metoprolol succinate (TOPROL-XL) 25 MG 24 hr tablet TAKE 1 TABLET DAILY 90 tablet 2  . pramipexole (MIRAPEX) 0.5 MG tablet Take 0.5 mg by mouth daily.    . RABEprazole (ACIPHEX) 20 MG tablet Take 1 tablet (20 mg total) by mouth daily. 90 tablet 3  . traMADol (ULTRAM) 50 MG tablet Take 1 tablet (50 mg total) by mouth every 6 (six) hours as needed for moderate pain. 120 tablet 3  . XARELTO 20 MG TABS tablet TAKE 1 TABLET DAILY WITH SUPPER 90 tablet 2   No current facility-administered medications for this visit.     PHYSICAL EXAMINATION: ECOG PERFORMANCE STATUS: 0 - Asymptomatic  Vitals:   08/21/17 0835  BP: (!) 114/59  Pulse: 71  Resp: 18  Temp: 98.4 F (36.9 C)  SpO2: 98%   Filed Weights   08/21/17 0835  Weight: 217 lb (98.4 kg)    GENERAL:alert, no distress and comfortable SKIN: skin color, texture, turgor are normal, no rashes or significant lesions EYES: normal, Conjunctiva are pink and non-injected, sclera clear OROPHARYNX:no exudate, no erythema and lips, buccal mucosa, and tongue normal  NECK: supple, thyroid normal size, non-tender, without nodularity LYMPH:  no palpable lymphadenopathy in the cervical, axillary or inguinal LUNGS: clear to auscultation and percussion with normal breathing effort HEART: regular rate & rhythm and no murmurs and no lower extremity edema ABDOMEN:abdomen soft, non-tender and normal bowel sounds MUSCULOSKELETAL:no cyanosis of digits and no clubbing  NEURO: alert & oriented x 3 with  fluent speech, no focal motor/sensory deficits EXTREMITIES: No lower extremity edema   LABORATORY DATA:  I have reviewed the data as listed CMP Latest Ref Rng & Units 02/21/2017 08/22/2016 02/15/2016  Glucose 70 - 140 mg/dL 105 98 136  BUN 7 - 26 mg/dL 15 16.6 11.5  Creatinine 0.70 - 1.30 mg/dL 0.87 0.9 0.9  Sodium 136 - 145 mmol/L 138 141 138  Potassium  3.5 - 5.1 mmol/L 4.7 4.5 4.6  Chloride 98 - 109 mmol/L 103 - -  CO2 22 - 29 mmol/L 28 28 25   Calcium 8.4 - 10.4 mg/dL 9.7 9.5 9.5  Total Protein 6.4 - 8.3 g/dL 7.1 6.6 6.5  Total Bilirubin 0.2 - 1.2 mg/dL 1.1 0.82 1.14  Alkaline Phos 40 - 150 U/L 41 46 52  AST 5 - 34 U/L 20 22 28   ALT 0 - 55 U/L 33 23 41    Lab Results  Component Value Date   WBC 22.1 (H) 08/21/2017   HGB 14.5 08/21/2017   HCT 46.2 08/21/2017   MCV 86.2 08/21/2017   PLT 139 (L) 08/21/2017   NEUTROABS 5.4 08/21/2017    ASSESSMENT & PLAN:  CLL (chronic lymphocytic leukemia) (HCC) CLL stage I, lymphocytosis and lymphadenopathy diagnosed 07/31/2009, initial CT scan revealed lymphadenopathy and mildly enlarged spleen.  Lab review: Absolute lymphocyte count decreased to 15.  Hemoglobin 14.5, platelet count 1398  Patient understands that his lymphocyte count will continue to fluctuate up and down.  Treatment plan: Observation. I discussed with him the indications for treatment would be rapid doubling time on development of severe anemia thrombocytopenia, rapidly enlarging lymphadenopathy, B symptoms like fevers chills night sweats and weight loss.  Chronic thrombocytopenia: Probably low-grade ITP.His platelet count today is 139 which is stable.  Return to clinic in 1 year for follow-up with blood tests.   Orders Placed This Encounter  Procedures  . CBC with Differential (Cancer Center Only)    Standing Status:   Future    Number of Occurrences:   1    Standing Expiration Date:   08/22/2018  . Lactate dehydrogenase (LDH)    Standing Status:   Future    Number of Occurrences:   1    Standing Expiration Date:   08/21/2018  . CMP ([place] only)    Standing Status:   Future    Number of Occurrences:   1    Standing Expiration Date:   08/22/2018   The patient has a good understanding of the overall plan. he agrees with it. he will call with any problems that may develop before the next visit here.    Harriette Ohara, MD 08/21/17

## 2017-10-04 ENCOUNTER — Other Ambulatory Visit: Payer: Self-pay | Admitting: Cardiology

## 2017-10-14 NOTE — Progress Notes (Signed)
Rodney Burns Date of Birth: [birth year]   History of Present Illness: Rodney Burns is seen for followup of atrial flutter and CAD. He has a history of atrial flutter and has had multiple cardioversions. He had radiofrequency ablation in March and then again in August of 2013. He had recurrent atrial flutter and has been treated with rate control and anticoagulation.  He has a history of coronary disease and is status post CABG in 1994. He had a nuclear stress test in March 2016 which showed an inferior and anteroapical infarct without evidence of ischemia. Ejection fraction was 32%.  He also has a history of CLL.   He was admitted in January 2017 with acute pleuritic chest pain. Clinically consistent with pericarditis although Ecg and Echo unremarkable. He did respond to anti-inflammatory therapy with ibuprofen and colchicine. No recurrent pain. These meds have been discontinued.   On follow up he recently travelled to help his sister move. He states he just ate fast food while he was there. Weight up 3 lbs. No edema. Notes increased symptoms of PND and orthopnea and also cough productive of thick clear sputum. No chest pain. HR range normal on phone. He denies any chest pain.  No palpitations or dizziness. Energy level is OK.   Current Outpatient Medications on File Prior to Visit  Medication Sig Dispense Refill  . CHERRY PO Take by mouth daily.    [name] Calcium (STOOL SOFTENER PO) Take by mouth 2 (two) times daily.    [name] ezetimibe-simvastatin (VYTORIN) 10-20 MG tablet TAKE 1 TABLET AT BEDTIME 90 tablet 3  . Fiber, Guar Gum, CHEW Chew 1 capsule by mouth daily.    [name] latanoprost (XALATAN) 0.005 % ophthalmic solution Place 1 drop into both eyes at bedtime.     [name] losartan (COZAAR) 50 MG tablet TAKE 1 TABLET DAILY (KEEP OFFICE VISIT) 90 tablet 4  . Magnesium 250 MG TABS Take 500 mg by mouth at bedtime.    . metoprolol succinate (TOPROL-XL) 25 MG 24 hr tablet TAKE 1 TABLET DAILY 90 tablet 2  .  pramipexole (MIRAPEX) 0.5 MG tablet Take 0.5 mg by mouth daily.    . RABEprazole (ACIPHEX) 20 MG tablet Take 1 tablet (20 mg total) by mouth daily. 90 tablet 3  . traMADol (ULTRAM) 50 MG tablet Take 1 tablet (50 mg total) by mouth every 6 (six) hours as needed for moderate pain. 120 tablet 3  . XARELTO 20 MG TABS tablet TAKE 1 TABLET DAILY WITH SUPPER 90 tablet 2   No current facility-administered medications on file prior to visit.     Allergies  Allergen Reactions  . Penicillins Other (See Comments)    Put pt in hospital 50 yrs ago  . Ace Inhibitors Cough  . Darvocet [Propoxyphene N-Acetaminophen] Rash    Tolerates tylenol  . Sulfa Antibiotics Rash    Past Medical History:  Diagnosis Date  . Atrial tachycardia ([place])    a. s/p DCCV 09/2009; b. s/p RFCA 03/2011; c. s/p DCCV 04/2011; d. s/p RFCA 09/17/11.  . Cataract   . Chronic systolic CHF (congestive heart failure) ([place])    a. 01/2015 Echo: EF 40-45%, antsept/infsept HK, triv AI, mild MR, mod dil LA, nl RV, mildly dil RA.  [name] CLL (chronic lymphoblastic leukemia)    Stage 0-1  . Coronary artery disease involving native coronary artery without angina pectoris    a. Status post CABG in 1994:By Dr. Cyndia Bent. LIMA - L Cx, seqSVG-Diag-LAD, and SVG-PDA-PL.  [name]  Cough    Consistant with ACE inhibitor mediated cough  . Glaucoma (increased eye pressure) 1991  . History of tobacco abuse    quit 1994  . Hypercholesterolemia    Well Controlled  . Hypertension   . Ischemic cardiomyopathy    a. 02/2011 Echo: EF 40-45%;  b. 01/2015 Echo: EF 40-45%.  . Malaria 1972   Hx of  . Pericarditis    a. 01/2015-->Treated w/ ibuprofen/colchicine.  . Sleep-disordered breathing 06/20/2011    Past Surgical History:  Procedure Laterality Date  . ATRIAL FLUTTER ABLATION N/A 04/11/2011   Procedure: ATRIAL FLUTTER ABLATION;  Surgeon: Deboraha Sprang, MD;  Location[facility] CATH LAB;  Service: Cardiovascular;  Laterality: N/A;  . ATRIAL FLUTTER ABLATION N/A 09/17/2011    Procedure: ATRIAL FLUTTER ABLATION;  Surgeon: Thompson Grayer, MD;  Location: [facility] CATH LAB;  Service: Cardiovascular;  Laterality: N/A;  . CARDIOVERSION  06/03/2011   Procedure: CARDIOVERSION;  Surgeon: Deboraha Sprang, MD;  Location: [facility];  Service: Cardiovascular;  Laterality: N/A;  . [place]   By Dr. Cyndia Bent. LIMA - L Cx, seqSVG-Diag-LAD, and SVG-PDA-PL  . ELECTROPHYSIOLOGY STUDY N/A 04/11/2011   Procedure: ELECTROPHYSIOLOGY STUDY;  Surgeon: Deboraha Sprang, MD;  Location[facility] CATH LAB;  Service: Cardiovascular;  Laterality: N/A;  . EP study and ablation  2013   by Dr Caryl Comes, repeat ablation by Dr Rayann Heman  . NM MYOVIEW LTD  03/2014   scar in the inferior and anteroapical regions without ischemia. This is similar to finding on Myoview in 2013. EF is a little lower 39%>>32%.  [name] TRIGGER FINGER RELEASE  12/2016   left hand   . US ECHOCARDIOGRAPHY  09-07-09; 02/2011   a. Est EF 40-45%; b. EF 40-45%. Gr 2 DD. Mild LA dil    Social History   Tobacco Use  Smoking Status Former Smoker  . Last attempt to quit: 01/22/1992  . Years since quitting: 25.7  Smokeless Tobacco Never Used    Social History   Substance and Sexual Activity  Alcohol Use No    Family History  Problem Relation Age of Onset  . Heart failure Father   . Cancer Brother        colon  . Down syndrome Son     Review of Systems: As noted in history of present illness.  All other systems were reviewed and are negative.  Physical Exam: BP (!) 144/72   Pulse 79   Ht 5\' 10"  (1.778 m)   Wt 219 lb 12.8 oz (99.7 kg)   BMI 31.54 kg/m  GENERAL:  Well appearing WM in NAD HEENT:  PERRL, EOMI, sclera are clear. Oropharynx is clear. NECK:  No jugular venous distention, carotid upstroke brisk and symmetric, no bruits, no thyromegaly or adenopathy LUNGS:  Clear to auscultation bilaterally CHEST:  Unremarkable HEART:  IRRR,  PMI not displaced or sustained,S1 and S2 within normal limits, no S3, no S4: no clicks,  no rubs, no murmurs ABD:  Soft, nontender. BS +, no masses or bruits. No hepatomegaly, no splenomegaly EXT:  2 + pulses throughout, no edema, no cyanosis no clubbing SKIN:  Warm and dry.  No rashes NEURO:  Alert and oriented x 3. Cranial nerves II through XII intact. PSYCH:  Cognitively intact      LABORATORY DATA: Lab Results  Component Value Date   WBC 22.1 (H) 08/21/2017   HGB 14.5 08/21/2017   HCT 46.2 08/21/2017   PLT 139 (L) 08/21/2017  GLUCOSE 117 (H) 08/21/2017   CHOL 142 02/21/2017   ALT 24 08/21/2017   AST 19 08/21/2017   NA 142 08/21/2017   K 4.5 08/21/2017   CL 107 08/21/2017   CREATININE 0.93 08/21/2017   BUN 18 08/21/2017   CO2 26 08/21/2017   INR 1.5 (H) 04/09/2011   Labs dated 02/23/16: cholesterol 128, triglycerides 59, HDL 50, LDL 65. Glucose 114. Other chemistries normal. Dated 03/20/17: cholesterol 130, triglycerides 50, HDL 49, LDL 71.    Ecg today shows atrial flutter with rate 79 bpm. Old anterior infarct. I have personally reviewed and interpreted this study.  Assessment / Plan: 1. Atrial flutter paroxysmal -  status post radiofrequency ablation x2. Previously felt to be chronic but when seen in June 2018 he was in [place]. He is  in [place] today. Rate is well controlled.   Continue metoprolol for rate control.  Continue anticoagulation with Xarelto 20 mg daily.   2. Coronary disease status post CABG in 1994. Patient is asymptomatic. Continue  beta blocker therapy. Myvoview study in March 2016 showed inferior and anteroapical scar without ischemia. EF 32%. Findings similar to prior Myoview in 2013.   3. Acute on Chronic congestive heart failure. Ejection fraction by Echo in January 2017 improved to 40-45%.  I think his recent symptoms related to volume overload due to too much sodium intake. Continue ARB and beta blocker therapy. Will give lasix 20 mg daily for 4 days. Take Mucinex for cough. Check chemistries and BNP. Will update Echo. If EF is low would  consider adding aldactone and possibly switching to [employer].  4. CLL stage 0. Followed by oncology.  5. Hypertension, controlled.   6. Thrombocytopenia.  7. Hyperlipidemia. On Vytorin Excellent control last year.  Follow up TBD based on above work up.

## 2017-10-16 ENCOUNTER — Encounter: Payer: Self-pay | Admitting: Cardiology

## 2017-10-16 ENCOUNTER — Ambulatory Visit (INDEPENDENT_AMBULATORY_CARE_PROVIDER_SITE_OTHER): Payer: Medicare Other | Admitting: Cardiology

## 2017-10-16 VITALS — BP 144/72 | HR 79 | Ht 70.0 in | Wt 219.8 lb

## 2017-10-16 DIAGNOSIS — I25810 Atherosclerosis of coronary artery bypass graft(s) without angina pectoris: Secondary | ICD-10-CM

## 2017-10-16 DIAGNOSIS — I4892 Unspecified atrial flutter: Secondary | ICD-10-CM

## 2017-10-16 DIAGNOSIS — I10 Essential (primary) hypertension: Secondary | ICD-10-CM | POA: Diagnosis not present

## 2017-10-16 DIAGNOSIS — I5023 Acute on chronic systolic (congestive) heart failure: Secondary | ICD-10-CM

## 2017-10-16 DIAGNOSIS — I5020 Unspecified systolic (congestive) heart failure: Secondary | ICD-10-CM | POA: Diagnosis not present

## 2017-10-16 DIAGNOSIS — I255 Ischemic cardiomyopathy: Secondary | ICD-10-CM

## 2017-10-16 MED ORDER — FUROSEMIDE 20 MG PO TABS: ORAL_TABLET | ORAL | 0 refills | Status: DC

## 2017-10-16 NOTE — Patient Instructions (Addendum)
Take Mucinex for cough  We will check blood work today and schedule you for an Echocardiogram.   Take lasix 20 mg daily for 4 days

## 2017-10-16 NOTE — Addendum Note (Signed)
Addended by: Kathyrn Lass on: 10/16/2017 02:33 PM   Modules accepted: Orders

## 2017-10-17 LAB — COMPREHENSIVE METABOLIC PANEL
A/G RATIO: 2.1 (ref 1.2–2.2)
ALBUMIN: 4.1 g/dL (ref 3.5–4.8)
ALT: 20 IU/L (ref 0–44)
AST: 16 IU/L (ref 0–40)
Alkaline Phosphatase: 38 IU/L — ABNORMAL LOW (ref 39–117)
BUN / CREAT RATIO: 13 (ref 10–24)
BUN: 12 mg/dL (ref 8–27)
Bilirubin Total: 0.8 mg/dL (ref 0.0–1.2)
CO2: 23 mmol/L (ref 20–29)
CREATININE: 0.91 mg/dL (ref 0.76–1.27)
Calcium: 8.9 mg/dL (ref 8.6–10.2)
Chloride: 106 mmol/L (ref 96–106)
GFR, EST AFRICAN AMERICAN: 96 mL/min/{1.73_m2} (ref >59)
GFR, EST NON AFRICAN AMERICAN: 83 mL/min/{1.73_m2} (ref >59)
GLOBULIN, TOTAL: 2 g/dL (ref 1.5–4.5)
Glucose: 108 mg/dL — ABNORMAL HIGH (ref 65–99)
POTASSIUM: 4.5 mmol/L (ref 3.5–5.2)
SODIUM: 144 mmol/L (ref 134–144)
Total Protein: 6.1 g/dL (ref 6.0–8.5)

## 2017-10-17 LAB — LIPID PANEL W/O CHOL/HDL RATIO
Cholesterol, Total: 108 mg/dL (ref 100–199)
HDL: 42 mg/dL (ref >39)
LDL CALC: 55 mg/dL (ref 0–99)
TRIGLYCERIDES: 56 mg/dL (ref 0–149)
VLDL Cholesterol Cal: 11 mg/dL (ref 5–40)

## 2017-10-17 LAB — BRAIN NATRIURETIC PEPTIDE: BNP: 249.1 pg/mL — ABNORMAL HIGH (ref 0.0–100.0)

## 2017-10-21 ENCOUNTER — Ambulatory Visit (HOSPITAL_COMMUNITY): Payer: Medicare Other | Attending: Cardiology

## 2017-10-21 ENCOUNTER — Other Ambulatory Visit: Payer: Self-pay

## 2017-10-21 DIAGNOSIS — I119 Hypertensive heart disease without heart failure: Secondary | ICD-10-CM | POA: Insufficient documentation

## 2017-10-21 DIAGNOSIS — I340 Nonrheumatic mitral (valve) insufficiency: Secondary | ICD-10-CM | POA: Insufficient documentation

## 2017-10-21 DIAGNOSIS — I4892 Unspecified atrial flutter: Secondary | ICD-10-CM | POA: Diagnosis not present

## 2017-10-21 DIAGNOSIS — Z87891 Personal history of nicotine dependence: Secondary | ICD-10-CM | POA: Diagnosis not present

## 2017-10-21 DIAGNOSIS — I25810 Atherosclerosis of coronary artery bypass graft(s) without angina pectoris: Secondary | ICD-10-CM | POA: Insufficient documentation

## 2017-10-21 DIAGNOSIS — I10 Essential (primary) hypertension: Secondary | ICD-10-CM

## 2017-10-24 ENCOUNTER — Other Ambulatory Visit: Payer: Self-pay

## 2017-10-24 DIAGNOSIS — I10 Essential (primary) hypertension: Secondary | ICD-10-CM

## 2017-10-24 DIAGNOSIS — I255 Ischemic cardiomyopathy: Secondary | ICD-10-CM

## 2017-10-24 MED ORDER — SACUBITRIL-VALSARTAN 49-51 MG PO TABS: 1.0000 | ORAL_TABLET | Freq: Two times a day (BID) | ORAL | 3 refills | Status: DC

## 2017-10-27 ENCOUNTER — Telehealth: Payer: Self-pay | Admitting: Cardiology

## 2017-10-27 NOTE — Telephone Encounter (Signed)
Returned call to patient he stated he received a phone call from [place] this past Sat.He was told to call our office about new prescription Entresto. [facility] called will need a pa for entresto.PA submitted thru cover my meds.

## 2017-10-27 NOTE — Telephone Encounter (Signed)
Follow Up:     Please call,concerning his medicine that was changed.

## 2017-11-06 DIAGNOSIS — I255 Ischemic cardiomyopathy: Secondary | ICD-10-CM | POA: Diagnosis not present

## 2017-11-06 DIAGNOSIS — I10 Essential (primary) hypertension: Secondary | ICD-10-CM | POA: Diagnosis not present

## 2017-11-06 LAB — BASIC METABOLIC PANEL
BUN / CREAT RATIO: 14 (ref 10–24)
BUN: 13 mg/dL (ref 8–27)
CO2: 23 mmol/L (ref 20–29)
CREATININE: 0.94 mg/dL (ref 0.76–1.27)
Calcium: 9.7 mg/dL (ref 8.6–10.2)
Chloride: 101 mmol/L (ref 96–106)
GFR calc Af Amer: 93 mL/min/{1.73_m2} (ref >59)
GFR, EST NON AFRICAN AMERICAN: 80 mL/min/{1.73_m2} (ref >59)
GLUCOSE: 131 mg/dL — AB (ref 65–99)
Potassium: 4.8 mmol/L (ref 3.5–5.2)
Sodium: 140 mmol/L (ref 134–144)

## 2017-11-07 ENCOUNTER — Ambulatory Visit (INDEPENDENT_AMBULATORY_CARE_PROVIDER_SITE_OTHER): Payer: Medicare Other | Admitting: Pharmacist Clinician (PhC)/ Clinical Pharmacy Specialist

## 2017-11-07 DIAGNOSIS — I255 Ischemic cardiomyopathy: Secondary | ICD-10-CM | POA: Diagnosis not present

## 2017-11-07 NOTE — Progress Notes (Signed)
11/07/2017 Rodney Burns August 09, 1945 [id number]   HPI:  Rodney Burns is a 72 y.o. male patient of Dr Martinique, with a [place] below who presents today for heart failure medication titration.   Patient has history of acute on chronic systolic heart failure, with most recent echo (10/21/17) showing an EF of 40-45%.  In addition to this his medical history is significant for CAD (s/p CABG x 4 in 1994), hypertension, atrial flutter, hyperlipidemia and CLL  Blood Pressure Goal:  130/80  Current Medications:  Entresto 49-51 mg bid  Metoprolol succ 25 mg qd  Family Hx:  PGF died at 30, father at 31 from MI  Brother has valve issues  Social Hx:  Quit about 30-40 years ago; no alcohol; no sodas, less than 2 caffeine per day (coffee in am, 1 tea at lunch)  Diet:  Eats out a about 1-2 times per week; no added salt; doesn't fo;llow any specific diet  Exercise:  Stays busy, has 3 acres, outdoor pool, does muc of the work himself  Home BP readings:  Has home readings, notes that his pressure is a little higher recently, did not bring paperwork  Phone shows highest to be in the 003'B systolic.   Intolerances:   ACEI - cough  Labs:  11/06/17:  Na 140, K 4.8, Glu 131, BUN 13, SCr 0.94  Wt Readings from Last 3 Encounters:  10/16/17 219 lb 12.8 oz (99.7 kg)  08/21/17 217 lb (98.4 kg)  02/21/17 216 lb 8 oz (98.2 kg)   BP Readings from Last 3 Encounters:  11/07/17 102/60  10/16/17 (!) 144/72  08/21/17 (!) 114/59   Pulse Readings from Last 3 Encounters:  11/07/17 76  10/16/17 79  08/21/17 71    Current Outpatient Medications  Medication Sig Dispense Refill  . Coenzyme Q10 (COQ-10) 200 MG CAPS Take 200 mg by mouth daily.    [name] Calcium (STOOL SOFTENER PO) Take by mouth 2 (two) times daily.    [name] ezetimibe-simvastatin (VYTORIN) 10-20 MG tablet TAKE 1 TABLET AT BEDTIME 90 tablet 3  . Fiber, Guar Gum, CHEW Chew 1 capsule by mouth daily.    [name] latanoprost (XALATAN) 0.005 % ophthalmic  solution Place 1 drop into both eyes at bedtime.     . Magnesium 250 MG TABS Take 500 mg by mouth at bedtime.    . metoprolol succinate (TOPROL-XL) 25 MG 24 hr tablet TAKE 1 TABLET DAILY 90 tablet 2  . pramipexole (MIRAPEX) 0.5 MG tablet Take 1.5 mg by mouth daily.     . RABEprazole (ACIPHEX) 20 MG tablet Take 1 tablet (20 mg total) by mouth daily. 90 tablet 3  . sacubitril-valsartan (ENTRESTO) 49-51 MG Take 1 tablet by mouth 2 (two) times daily. 180 tablet 3  . traMADol (ULTRAM) 50 MG tablet Take 1 tablet (50 mg total) by mouth every 6 (six) hours as needed for moderate pain. 120 tablet 3  . XARELTO 20 MG TABS tablet TAKE 1 TABLET DAILY WITH SUPPER 90 tablet 2   No current facility-administered medications for this visit.     Allergies  Allergen Reactions  . Penicillins Other (See Comments)    Put pt in hospital 50 yrs ago  . Ace Inhibitors Cough  . Darvocet [Propoxyphene N-Acetaminophen] Rash    Tolerates tylenol  . Sulfa Antibiotics Rash    Past Medical History:  Diagnosis Date  . Atrial tachycardia ([place])    a. s/p DCCV 09/2009; b. s/p RFCA 03/2011;  c. s/p DCCV 04/2011; d. s/p RFCA 09/17/11.  . Cataract   . Chronic systolic CHF (congestive heart failure) ([place])    a. 01/2015 Echo: EF 40-45%, antsept/infsept HK, triv AI, mild MR, mod dil LA, nl RV, mildly dil RA.  [name] CLL (chronic lymphoblastic leukemia)    Stage 0-1  . Coronary artery disease involving native coronary artery without angina pectoris    a. Status post CABG in 1994:By Dr. Cyndia Bent. LIMA - L Cx, seqSVG-Diag-LAD, and SVG-PDA-PL.  [name] Cough    Consistant with ACE inhibitor mediated cough  . Glaucoma (increased eye pressure) 1991  . History of tobacco abuse    quit 1994  . Hypercholesterolemia    Well Controlled  . Hypertension   . Ischemic cardiomyopathy    a. 02/2011 Echo: EF 40-45%;  b. 01/2015 Echo: EF 40-45%.  . Malaria 1972   Hx of  . Pericarditis    a. 01/2015-->Treated w/ ibuprofen/colchicine.  . Sleep-disordered  breathing 06/20/2011    Blood pressure 102/60, pulse 76.  Cardiomyopathy, ischemic Patient with cardiomyopathay and EF at 40-45% by echo.  He has been tolerating Entresto 49/51 mg twice daily.  He notes some occasional "twinges" that can last a few minutes, happens 2-3 times per week.  I am not sure if this is just flutter, but he will continue to monitor.  Because his pressure is at 102/60 today in the office, I am not going to increase the [place] further.  I have asked that he monitor home BP readings for the next month, and return, with cuff, at that time.  While he may not tolerate a higher dose of Entresto, we can consider increasing the metoprolol succinate.     Tommy Medal PharmD CPP [facility] [address] [place], [place] [address] [phone]

## 2017-11-07 NOTE — Patient Instructions (Signed)
Return for a a follow up appointment in 1 month  Your blood pressure today is 102/60   Check your blood pressure at home daily and keep record of the readings.  Take your BP meds as follows:  Continue with your current medications  Bring all of your meds, your BP cuff and your record of home blood pressures to your next appointment.  Exercise as you're able, try to walk approximately 30 minutes per day.  Keep salt intake to a minimum, especially watch canned and prepared boxed foods.  Eat more fresh fruits and vegetables and fewer canned items.  Avoid eating in fast food restaurants.    HOW TO TAKE YOUR BLOOD PRESSURE: . Rest 5 minutes before taking your blood pressure. .  Don't smoke or drink caffeinated beverages for at least 30 minutes before. . Take your blood pressure before (not after) you eat. . Sit comfortably with your back supported and both feet on the floor (don't cross your legs). . Elevate your arm to heart level on a table or a desk. . Use the proper sized cuff. It should fit smoothly and snugly around your bare upper arm. There should be enough room to slip a fingertip under the cuff. The bottom edge of the cuff should be 1 inch above the crease of the elbow. . Ideally, take 3 measurements at one sitting and record the average.

## 2017-11-07 NOTE — Assessment & Plan Note (Addendum)
Patient with cardiomyopathay and EF at 40-45% by echo.  He has been tolerating Entresto 49/51 mg twice daily.  He notes some occasional "twinges" that can last a few minutes, happens 2-3 times per week.  I am not sure if this is just flutter, but he will continue to monitor.  Because his pressure is at 102/60 today in the office, I am not going to increase the [place] further.  I have asked that he monitor home BP readings for the next month, and return, with cuff, at that time.  While he may not tolerate a higher dose of Entresto, we can consider increasing the metoprolol succinate.

## 2017-12-04 ENCOUNTER — Ambulatory Visit: Payer: Medicare Other

## 2017-12-05 ENCOUNTER — Ambulatory Visit (INDEPENDENT_AMBULATORY_CARE_PROVIDER_SITE_OTHER): Payer: Medicare Other | Admitting: Pharmacist

## 2017-12-05 ENCOUNTER — Encounter: Payer: Self-pay | Admitting: Pharmacist

## 2017-12-05 VITALS — BP 110/62 | HR 58 | Ht 70.0 in | Wt 220.4 lb

## 2017-12-05 DIAGNOSIS — I255 Ischemic cardiomyopathy: Secondary | ICD-10-CM | POA: Diagnosis not present

## 2017-12-05 NOTE — Progress Notes (Signed)
HPI:  Rodney Burns is a 72 y.o. male patient of Dr Martinique, with a [place] below who presents today for heart failure medication titration.   Patient has history of acute on chronic systolic heart failure, with most recent echo (10/21/17) showing an EF of 40-45%.  In addition to this his medical history is significant for CAD (s/p CABG x 4 in 1994), hypertension, atrial flutter, hyperlipidemia and CLL. Patient still feeling well and denies problems with current therapy.   Blood Pressure Goal:  130/80  Current Medications:  Entresto 49-51 mg twice daily  Metoprolol succ 25 mg daily  Family Hx:  PGF died at 42, father at 92 from MI  Brother has valve issues  Social Hx:  Quit about 30-40 years ago; no alcohol; no sodas, less than 2 caffeine per day (coffee in am, 1 tea at lunch)  Diet:  Eats out a about 1-2 times per week; no added salt; doesn't fo;llow any specific diet  Exercise:  Stays busy, has 3 acres, outdoor pool, does muc of the work himself  Home BP readings:  20 readings, average 110/68; HR 55-86bpm  Home BP (wrist cuff) [place] accurate within 69mmHg when compared to manual readying.   Intolerances:   ACEI - cough  Labs:  11/06/17:  Na 140, K 4.8, Glu 131, BUN 13, SCr 0.94  Wt Readings from Last 3 Encounters:  12/05/17 220 lb 6.4 oz (100 kg)  10/16/17 219 lb 12.8 oz (99.7 kg)  08/21/17 217 lb (98.4 kg)   BP Readings from Last 3 Encounters:  12/05/17 110/62  11/07/17 102/60  10/16/17 (!) 144/72   Pulse Readings from Last 3 Encounters:  12/05/17 (!) 58  11/07/17 76  10/16/17 79    Current Outpatient Medications  Medication Sig Dispense Refill  . Coenzyme Q10 (COQ-10) 200 MG CAPS Take 200 mg by mouth daily.    [name] Calcium (STOOL SOFTENER PO) Take by mouth 2 (two) times daily.    [name] ezetimibe-simvastatin (VYTORIN) 10-20 MG tablet TAKE 1 TABLET AT BEDTIME 90 tablet 3  . Fiber, Guar Gum, CHEW Chew 1 capsule by mouth daily.    [name] latanoprost (XALATAN) 0.005  % ophthalmic solution Place 1 drop into both eyes at bedtime.     . Magnesium 250 MG TABS Take 500 mg by mouth at bedtime.    . metoprolol succinate (TOPROL-XL) 25 MG 24 hr tablet TAKE 1 TABLET DAILY 90 tablet 2  . pramipexole (MIRAPEX) 0.5 MG tablet Take 1.5 mg by mouth daily.     . RABEprazole (ACIPHEX) 20 MG tablet Take 1 tablet (20 mg total) by mouth daily. 90 tablet 3  . sacubitril-valsartan (ENTRESTO) 49-51 MG Take 1 tablet by mouth 2 (two) times daily. 180 tablet 3  . traMADol (ULTRAM) 50 MG tablet Take 1 tablet (50 mg total) by mouth every 6 (six) hours as needed for moderate pain. 120 tablet 3  . XARELTO 20 MG TABS tablet TAKE 1 TABLET DAILY WITH SUPPER 90 tablet 2   No current facility-administered medications for this visit.     Allergies  Allergen Reactions  . Penicillins Other (See Comments)    Put pt in hospital 50 yrs ago  . Ace Inhibitors Cough  . Darvocet [Propoxyphene N-Acetaminophen] Rash    Tolerates tylenol  . Sulfa Antibiotics Rash    Past Medical History:  Diagnosis Date  . Atrial tachycardia ([place])    a. s/p DCCV 09/2009; b. s/p RFCA 03/2011; c. s/p  DCCV 04/2011; d. s/p RFCA 09/17/11.  . Cataract   . Chronic systolic CHF (congestive heart failure) ([place])    a. 01/2015 Echo: EF 40-45%, antsept/infsept HK, triv AI, mild MR, mod dil LA, nl RV, mildly dil RA.  [name] CLL (chronic lymphoblastic leukemia)    Stage 0-1  . Coronary artery disease involving native coronary artery without angina pectoris    a. Status post CABG in 1994:By Dr. Cyndia Bent. LIMA - L Cx, seqSVG-Diag-LAD, and SVG-PDA-PL.  [name] Cough    Consistant with ACE inhibitor mediated cough  . Glaucoma (increased eye pressure) 1991  . History of tobacco abuse    quit 1994  . Hypercholesterolemia    Well Controlled  . Hypertension   . Ischemic cardiomyopathy    a. 02/2011 Echo: EF 40-45%;  b. 01/2015 Echo: EF 40-45%.  . Malaria 1972   Hx of  . Pericarditis    a. 01/2015-->Treated w/ ibuprofen/colchicine.  .  Sleep-disordered breathing 06/20/2011    Blood pressure 110/62, pulse (!) 58, height 5\' 10"  (1.778 m), weight 220 lb 6.4 oz (100 kg).  Cardiomyopathy, ischemic Blood pressure remains at goal. Patient denies problems with Entresto or any other medication. Will continue therapy without changes and follow up as needed.   Shenice Dolder Rodriguez-Guzman PharmD, BCPS, [place] [address] 12/05/2017 2:37 PM

## 2017-12-05 NOTE — Assessment & Plan Note (Signed)
Blood pressure remains at goal. Patient denies problems with Entresto or any other medication. Will continue therapy without changes and follow up as needed.

## 2017-12-05 NOTE — Patient Instructions (Signed)
Return for a  follow up appointment as needed  Check your blood pressure at home daily (if able) and keep record of the readings.  Take your BP meds as follows: *NO MEDICATION CHANGE*  Bring all of your meds, your BP cuff and your record of home blood pressures to your next appointment.  Exercise as you're able, try to walk approximately 30 minutes per day.  Keep salt intake to a minimum, especially watch canned and prepared boxed foods.  Eat more fresh fruits and vegetables and fewer canned items.  Avoid eating in fast food restaurants.    HOW TO TAKE YOUR BLOOD PRESSURE: . Rest 5 minutes before taking your blood pressure. .  Don't smoke or drink caffeinated beverages for at least 30 minutes before. . Take your blood pressure before (not after) you eat. . Sit comfortably with your back supported and both feet on the floor (don't cross your legs). . Elevate your arm to heart level on a table or a desk. . Use the proper sized cuff. It should fit smoothly and snugly around your bare upper arm. There should be enough room to slip a fingertip under the cuff. The bottom edge of the cuff should be 1 inch above the crease of the elbow. . Ideally, take 3 measurements at one sitting and record the average.

## 2017-12-16 ENCOUNTER — Other Ambulatory Visit: Payer: Self-pay

## 2017-12-16 DIAGNOSIS — C9110 Chronic lymphocytic leukemia of B-cell type not having achieved remission: Secondary | ICD-10-CM

## 2017-12-16 MED ORDER — TRAMADOL HCL 50 MG PO TABS: 50.0000 mg | ORAL_TABLET | Freq: Four times a day (QID) | ORAL | 3 refills | Status: DC | PRN

## 2017-12-24 ENCOUNTER — Other Ambulatory Visit: Payer: Self-pay | Admitting: Cardiology

## 2017-12-26 DIAGNOSIS — H00026 Hordeolum internum left eye, unspecified eyelid: Secondary | ICD-10-CM | POA: Diagnosis not present

## 2017-12-26 DIAGNOSIS — H401112 Primary open-angle glaucoma, right eye, moderate stage: Secondary | ICD-10-CM | POA: Diagnosis not present

## 2017-12-26 DIAGNOSIS — H401121 Primary open-angle glaucoma, left eye, mild stage: Secondary | ICD-10-CM | POA: Diagnosis not present

## 2017-12-26 DIAGNOSIS — H01009 Unspecified blepharitis unspecified eye, unspecified eyelid: Secondary | ICD-10-CM | POA: Diagnosis not present

## 2018-01-15 NOTE — Progress Notes (Signed)
Office Visit Note  Patient: Rodney Burns             Date of Birth: February 04, 1945           MRN: [id number]             PCP: Hulan Fess, MD Referring: Hulan Fess, MD Visit Date: 01/27/2018 Occupation: @GUAROCC @  Subjective:  Pain of the Right Shoulder; Pain of the Left Shoulder; and Shoulder Pain (Bil shoulder pain, wants injections)   History of Present Illness: Rodney Burns is a 72 y.o. male with history of recurrent shoulder joint bursitis and trochanteric bursitis.  His returns today after his last appointment 1 year ago.  He states the cortisone injection to his shoulder joints helped him for a long time.  He has been having pain and discomfort in his bilateral shoulders for about a month now.  He states the right trochanteric bursitis has been recurrent and comes every other month.  The symptoms are better currently.  He has been having some discomfort in his left ring finger.  He states he had left middle finger trigger release in the past.  Activities of Daily Living:  Patient reports morning stiffness for 1 hour.   Patient Denies nocturnal pain.  Difficulty dressing/grooming: Denies Difficulty climbing stairs: Denies Difficulty getting out of chair: Denies Difficulty using hands for taps, buttons, cutlery, and/or writing: Reports  Review of Systems  Constitutional: Positive for fatigue. Negative for night sweats.  HENT: Negative for mouth sores, mouth dryness and nose dryness.   Eyes: Negative for redness and dryness.  Respiratory: Negative for shortness of breath and difficulty breathing.   Cardiovascular: Negative for chest pain, palpitations, hypertension, irregular heartbeat and swelling in legs/feet.  Gastrointestinal: Negative for constipation and diarrhea.  Endocrine: Negative for increased urination.  Genitourinary: Negative for difficulty urinating.  Musculoskeletal: Positive for arthralgias, joint pain and morning stiffness. Negative for joint swelling,  myalgias, muscle weakness, muscle tenderness and myalgias.  Skin: Negative for color change, rash, hair loss, nodules/bumps, skin tightness, ulcers and sensitivity to sunlight.  Allergic/Immunologic: Negative for susceptible to infections.  Neurological: Negative for dizziness, fainting, numbness, memory loss, night sweats and weakness ( ).  Hematological: Negative for bruising/bleeding tendency and swollen glands.  Psychiatric/Behavioral: Positive for sleep disturbance. Negative for depressed mood. The patient is not nervous/anxious.     PMFS History:  Patient Active Problem List   Diagnosis Date Noted  . CAD (coronary artery disease) of artery bypass graft 12/07/2015  . Chronic anticoagulation 03/06/2015  . Pericarditis 02/15/2015  . Hx of CABG x 5 '94   . Cardiomyopathy, ischemic 01/28/2012  . Sleep-disordered breathing 06/20/2011  . Syncope and collapse 03/29/2011  . Atrial flutter-atypical   . CLL (chronic lymphocytic leukemia) ([place])   . Dyslipidemia   . Essential hypertension     Past Medical History:  Diagnosis Date  . Atrial tachycardia ([place])    a. s/p DCCV 09/2009; b. s/p RFCA 03/2011; c. s/p DCCV 04/2011; d. s/p RFCA 09/17/11.  . Cataract   . Chronic systolic CHF (congestive heart failure) ([place])    a. 01/2015 Echo: EF 40-45%, antsept/infsept HK, triv AI, mild MR, mod dil LA, nl RV, mildly dil RA.  [name] CLL (chronic lymphoblastic leukemia)    Stage 0-1  . Coronary artery disease involving native coronary artery without angina pectoris    a. Status post CABG in 1994:By Dr. Cyndia Bent. LIMA - L Cx, seqSVG-Diag-LAD, and SVG-PDA-PL.  [name] Cough    Consistant  with ACE inhibitor mediated cough  . Glaucoma (increased eye pressure) 1991  . History of tobacco abuse    quit 1994  . Hypercholesterolemia    Well Controlled  . Hypertension   . Ischemic cardiomyopathy    a. 02/2011 Echo: EF 40-45%;  b. 01/2015 Echo: EF 40-45%.  . Malaria 1972   Hx of  . Pericarditis    a. 01/2015-->Treated w/  ibuprofen/colchicine.  . Sleep-disordered breathing 06/20/2011    Family History  Problem Relation Age of Onset  . Heart failure Father   . Cancer Brother        colon  . Down syndrome Son    Past Surgical History:  Procedure Laterality Date  . ATRIAL FLUTTER ABLATION N/A 04/11/2011   Procedure: ATRIAL FLUTTER ABLATION;  Surgeon: Deboraha Sprang, MD;  Location: [facility] CATH LAB;  Service: Cardiovascular;  Laterality: N/A;  . ATRIAL FLUTTER ABLATION N/A 09/17/2011   Procedure: ATRIAL FLUTTER ABLATION;  Surgeon: Thompson Grayer, MD;  Location: [facility] CATH LAB;  Service: Cardiovascular;  Laterality: N/A;  . CARDIOVERSION  06/03/2011   Procedure: CARDIOVERSION;  Surgeon: Deboraha Sprang, MD;  Location: [place];  Service: Cardiovascular;  Laterality: N/A;  . CATARACT EXTRACTION    . CORONARY ARTERY BYPASS GRAFT  1994   By Dr. Cyndia Bent. LIMA - L Cx, seqSVG-Diag-LAD, and SVG-PDA-PL  . ELECTROPHYSIOLOGY STUDY N/A 04/11/2011   Procedure: ELECTROPHYSIOLOGY STUDY;  Surgeon: Deboraha Sprang, MD;  Location: [facility] CATH LAB;  Service: Cardiovascular;  Laterality: N/A;  . EP study and ablation  2013   by Dr Caryl Comes, repeat ablation by Dr Rayann Heman  . NM MYOVIEW LTD  03/2014   scar in the inferior and anteroapical regions without ischemia. This is similar to finding on Myoview in 2013. EF is a little lower 39%>>32%.  [name] TRIGGER FINGER RELEASE  12/2016   left hand   . US ECHOCARDIOGRAPHY  09-07-09; 02/2011   a. Est EF 40-45%; b. EF 40-45%. Gr 2 DD. Mild LA dil   Social History   Social History Narrative  . Not on file    Objective: Vital Signs: BP 121/73 (BP Location: Left Arm, Patient Position: Sitting, Cuff Size: Normal)   Pulse 75   Resp 16   Ht 5\' 10"  (1.778 m)   Wt 222 lb 3.2 oz (100.8 kg)   BMI 31.88 kg/m    Physical Exam Vitals signs and nursing note reviewed.  Constitutional:      Appearance: He is well-developed.  HENT:     Head: Normocephalic and atraumatic.  Eyes:     Conjunctiva/sclera: Conjunctivae  normal.     Pupils: Pupils are equal, round, and reactive to light.  Neck:     Musculoskeletal: Normal range of motion and neck supple.  Cardiovascular:     Rate and Rhythm: Normal rate and regular rhythm.     Heart sounds: Normal heart sounds.  Pulmonary:     Effort: Pulmonary effort is normal.     Breath sounds: Normal breath sounds.  Abdominal:     General: Bowel sounds are normal.     Palpations: Abdomen is soft.  Skin:    General: Skin is warm and dry.     Capillary Refill: Capillary refill takes less than 2 seconds.  Neurological:     Mental Status: He is alert and oriented to person, place, and time.  Psychiatric:        Behavior: Behavior normal.      Musculoskeletal Exam: C-spine thoracic lumbar  spine good range of motion.  He had painful range of motion of bilateral shoulders.  Elbow joints wrist joints with good range of motion.  He had DIP and PIP thickening in his hands consistent with osteoarthritis.  He had left third trigger finger.  Hip joints knee joints ankles MTPs PIPs were in good range of motion.  He had tenderness on palpation over right trochanteric bursa consistent with trochanteric bursitis.  CDAI Exam: CDAI Score: Not documented Patient Global Assessment: Not documented; Provider Global Assessment: Not documented Swollen: Not documented; Tender: Not documented Joint Exam   Not documented   There is currently no information documented on the homunculus. Go to the Rheumatology activity and complete the homunculus joint exam.  Investigation: No additional findings.  Imaging: No results found.  Recent Labs: Lab Results  Component Value Date   WBC 22.1 (H) 08/21/2017   HGB 14.5 08/21/2017   PLT 139 (L) 08/21/2017   NA 140 11/06/2017   K 4.8 11/06/2017   CL 101 11/06/2017   CO2 23 11/06/2017   GLUCOSE 131 (H) 11/06/2017   BUN 13 11/06/2017   CREATININE 0.94 11/06/2017   BILITOT 0.8 10/16/2017   ALKPHOS 38 (L) 10/16/2017   AST 16 10/16/2017     ALT 20 10/16/2017   PROT 6.1 10/16/2017   ALBUMIN 4.1 10/16/2017   CALCIUM 9.7 11/06/2017   GFRAA 93 11/06/2017    Speciality Comments: No specialty comments available.  Procedures:  Large Joint Inj: bilateral glenohumeral on 01/27/2018 10:31 AM Indications: pain Details: 27 G 1.5 in needle, posterior approach  Arthrogram: No  Medications (Right): 1.5 mL lidocaine 1 %; 40 mg triamcinolone acetonide 40 MG/ML Medications (Left): 1.5 mL lidocaine 1 %; 40 mg triamcinolone acetonide 40 MG/ML Outcome: tolerated well, no immediate complications Procedure, treatment alternatives, risks and benefits explained, specific risks discussed. Consent was given by the patient. Immediately prior to procedure a time out was called to verify the correct patient, procedure, equipment, support staff and site/side marked as required. Patient was prepped and draped in the usual sterile fashion.     Allergies: Penicillins; Ace inhibitors; Darvocet [propoxyphene n-acetaminophen]; and Sulfa antibiotics   Assessment / Plan:     Visit Diagnoses: Chronic pain of both shoulders-patient has discomfort in his bilateral shoulders.  The last cortisone injection lasted for almost a year.  Per his request bilateral shoulder joints were injected with cortisone as described above.  He tolerated procedure well.  I encouraged him to do shoulder joint exercises.  Trochanteric bursitis, right hip-he gets recurrent trochanteric bursitis.  Stretching exercises were discussed.  Primary osteoarthritis of both hands-he has DIP and PIP thickening in his hands consistent with osteoarthritis.  Trigger ring finger of left hand-he has appointment coming up with Dr. Caralyn Guile.  Other medical problems are listed as follows:  CLL (chronic lymphocytic leukemia) ([place]) - Followed by Dr. [name]  History of thrombocytopenia - Followed by Dr. [name]   Hx of CABG x 5 '94  Dyslipidemia  Essential hypertension  Cardiomyopathy, ischemic  - Echo 02/16/15: EF 40-45%   Atrial flutter, unspecified type ([place]) - Followed by Dr. Martinique  Chronic anticoagulation - Xarelto   Restless leg syndrome   Orders: Orders Placed This Encounter  Procedures  . Large Joint Inj   No orders of the defined types were placed in this encounter.     Follow-Up Instructions: Return if symptoms worsen or fail to improve, for Osteoarthritis.   Bo Merino, MD  Note - This record has  been created using [employer].  Chart creation errors have been sought, but may not always  have been located. Such creation errors do not reflect on  the standard of medical care.

## 2018-01-27 ENCOUNTER — Encounter: Payer: Self-pay | Admitting: Rheumatology

## 2018-01-27 ENCOUNTER — Ambulatory Visit (INDEPENDENT_AMBULATORY_CARE_PROVIDER_SITE_OTHER): Payer: Medicare Other | Admitting: Rheumatology

## 2018-01-27 VITALS — BP 121/73 | HR 75 | Resp 16 | Ht 70.0 in | Wt 222.2 lb

## 2018-01-27 DIAGNOSIS — M19041 Primary osteoarthritis, right hand: Secondary | ICD-10-CM

## 2018-01-27 DIAGNOSIS — I255 Ischemic cardiomyopathy: Secondary | ICD-10-CM

## 2018-01-27 DIAGNOSIS — E785 Hyperlipidemia, unspecified: Secondary | ICD-10-CM | POA: Diagnosis not present

## 2018-01-27 DIAGNOSIS — M19042 Primary osteoarthritis, left hand: Secondary | ICD-10-CM

## 2018-01-27 DIAGNOSIS — G8929 Other chronic pain: Secondary | ICD-10-CM

## 2018-01-27 DIAGNOSIS — M25512 Pain in left shoulder: Secondary | ICD-10-CM | POA: Diagnosis not present

## 2018-01-27 DIAGNOSIS — M65342 Trigger finger, left ring finger: Secondary | ICD-10-CM | POA: Diagnosis not present

## 2018-01-27 DIAGNOSIS — M25511 Pain in right shoulder: Secondary | ICD-10-CM

## 2018-01-27 DIAGNOSIS — M7061 Trochanteric bursitis, right hip: Secondary | ICD-10-CM

## 2018-01-27 DIAGNOSIS — I10 Essential (primary) hypertension: Secondary | ICD-10-CM | POA: Diagnosis not present

## 2018-01-27 DIAGNOSIS — Z951 Presence of aortocoronary bypass graft: Secondary | ICD-10-CM | POA: Diagnosis not present

## 2018-01-27 DIAGNOSIS — Z7901 Long term (current) use of anticoagulants: Secondary | ICD-10-CM

## 2018-01-27 DIAGNOSIS — I4892 Unspecified atrial flutter: Secondary | ICD-10-CM | POA: Diagnosis not present

## 2018-01-27 DIAGNOSIS — G2581 Restless legs syndrome: Secondary | ICD-10-CM

## 2018-01-27 DIAGNOSIS — C9110 Chronic lymphocytic leukemia of B-cell type not having achieved remission: Secondary | ICD-10-CM

## 2018-01-27 DIAGNOSIS — Z862 Personal history of diseases of the blood and blood-forming organs and certain disorders involving the immune mechanism: Secondary | ICD-10-CM

## 2018-01-27 MED ORDER — TRIAMCINOLONE ACETONIDE 40 MG/ML IJ SUSP
40.0000 mg | INTRAMUSCULAR | Status: AC | PRN
  Administered 2018-01-27: 40 mg via INTRA_ARTICULAR

## 2018-01-27 MED ORDER — LIDOCAINE HCL 1 % IJ SOLN
1.5000 mL | INTRAMUSCULAR | Status: AC | PRN
  Administered 2018-01-27: 1.5 mL

## 2018-01-31 ENCOUNTER — Other Ambulatory Visit: Payer: Self-pay | Admitting: Cardiology

## 2018-02-10 DIAGNOSIS — M65341 Trigger finger, right ring finger: Secondary | ICD-10-CM | POA: Diagnosis not present

## 2018-02-10 DIAGNOSIS — M65342 Trigger finger, left ring finger: Secondary | ICD-10-CM | POA: Diagnosis not present

## 2018-02-13 ENCOUNTER — Telehealth: Payer: Self-pay | Admitting: *Deleted

## 2018-02-13 NOTE — Telephone Encounter (Signed)
   [facility] Pre-operative Risk Assessment    Request for surgical clearance:  1. What type of surgery is being performed? LEFT RING FINGER A1 PULLEY RELEASE    2. When is this surgery scheduled? 02/24/2018   3. What type of clearance is required (medical clearance vs. Pharmacy clearance to hold med vs. Both)?   4. Are there any medications that need to be held prior to surgery and how long?[place]    5. Practice name and name of physician performing surgery? [facility]    6. What is your office phone number?[phone]     7.   What is your office fax number?[place]   Anesthesia type (None, local, MAC, general) ? LOCAL

## 2018-02-16 NOTE — Telephone Encounter (Signed)
Patient with diagnosis of atrial fibrillation on Xarelto for anticoagulation.    Procedure: left ring finger A1 pulley release Date of procedure: 02/24/2018  CHADS2-VASc score of  4 (CHF, HTN, AGE, , CAD, AGE, male)  CrCl 99.8 Platelet count 139  Per office protocol, patient can hold Xarelto for 2 days prior to procedure.    Patient should restart Xarelto on the evening of procedure or day after, at discretion of procedure MD

## 2018-02-16 NOTE — Telephone Encounter (Signed)
   Primary Cardiologist: [name], MD  Chart reviewed as part of pre-operative protocol coverage. Patient was contacted 02/16/2018 in reference to pre-operative risk assessment for pending surgery as outlined below.  Rodney Burns was last seen on 10/16/17 by Dr. Martinique.  He has followed with our pharmacist a couple times since then for titration of cardiomyopathy medications. He reports doing well over the past couple months. He is staying active, walking 1-2 miles per day without chest pain or SOB. He can easily complete 4 METs without anginal complaints. Therefore, based on ACC/AHA guidelines, the patient would be at acceptable risk for the planned procedure without further cardiovascular testing.   Per pharmacy recommendations, patient may hold xarelto 2 days prior to his upcoming trigger finger surgery scheduled for 02/24/2018. He should restart his xarelto as soon as he is cleared to do so by Dr. Apolonio Schneiders (preferably the evening of his surgery or the next morning).   I will route this recommendation to the requesting party via Epic fax function and remove from pre-op pool.  Please call with questions.  Abigail Butts, PA-C 02/16/2018, 9:31 AM

## 2018-02-22 ENCOUNTER — Other Ambulatory Visit: Payer: Self-pay | Admitting: Cardiology

## 2018-02-23 NOTE — Telephone Encounter (Signed)
Rx request sent to pharmacy.  

## 2018-02-24 DIAGNOSIS — M65341 Trigger finger, right ring finger: Secondary | ICD-10-CM | POA: Diagnosis not present

## 2018-02-26 ENCOUNTER — Other Ambulatory Visit: Payer: Self-pay | Admitting: Hematology and Oncology

## 2018-02-26 DIAGNOSIS — C9110 Chronic lymphocytic leukemia of B-cell type not having achieved remission: Secondary | ICD-10-CM

## 2018-02-26 MED ORDER — TRAMADOL HCL 50 MG PO TABS: 50.0000 mg | ORAL_TABLET | Freq: Four times a day (QID) | ORAL | 3 refills | Status: DC | PRN

## 2018-05-14 DIAGNOSIS — I5020 Unspecified systolic (congestive) heart failure: Secondary | ICD-10-CM | POA: Diagnosis not present

## 2018-05-14 DIAGNOSIS — D696 Thrombocytopenia, unspecified: Secondary | ICD-10-CM | POA: Diagnosis not present

## 2018-05-14 DIAGNOSIS — I4892 Unspecified atrial flutter: Secondary | ICD-10-CM | POA: Diagnosis not present

## 2018-05-14 DIAGNOSIS — I2510 Atherosclerotic heart disease of native coronary artery without angina pectoris: Secondary | ICD-10-CM | POA: Diagnosis not present

## 2018-05-14 DIAGNOSIS — C9110 Chronic lymphocytic leukemia of B-cell type not having achieved remission: Secondary | ICD-10-CM | POA: Diagnosis not present

## 2018-05-14 DIAGNOSIS — E785 Hyperlipidemia, unspecified: Secondary | ICD-10-CM | POA: Diagnosis not present

## 2018-05-14 DIAGNOSIS — Z86010 Personal history of colonic polyps: Secondary | ICD-10-CM | POA: Diagnosis not present

## 2018-05-14 DIAGNOSIS — R7301 Impaired fasting glucose: Secondary | ICD-10-CM | POA: Diagnosis not present

## 2018-05-14 DIAGNOSIS — G2581 Restless legs syndrome: Secondary | ICD-10-CM | POA: Diagnosis not present

## 2018-05-14 DIAGNOSIS — I10 Essential (primary) hypertension: Secondary | ICD-10-CM | POA: Diagnosis not present

## 2018-05-22 DIAGNOSIS — R7301 Impaired fasting glucose: Secondary | ICD-10-CM | POA: Diagnosis not present

## 2018-05-22 DIAGNOSIS — I2510 Atherosclerotic heart disease of native coronary artery without angina pectoris: Secondary | ICD-10-CM | POA: Diagnosis not present

## 2018-05-22 DIAGNOSIS — E785 Hyperlipidemia, unspecified: Secondary | ICD-10-CM | POA: Diagnosis not present

## 2018-05-22 DIAGNOSIS — C9110 Chronic lymphocytic leukemia of B-cell type not having achieved remission: Secondary | ICD-10-CM | POA: Diagnosis not present

## 2018-05-22 DIAGNOSIS — Z86010 Personal history of colonic polyps: Secondary | ICD-10-CM | POA: Diagnosis not present

## 2018-05-22 DIAGNOSIS — G2581 Restless legs syndrome: Secondary | ICD-10-CM | POA: Diagnosis not present

## 2018-05-22 DIAGNOSIS — I10 Essential (primary) hypertension: Secondary | ICD-10-CM | POA: Diagnosis not present

## 2018-05-22 DIAGNOSIS — I4892 Unspecified atrial flutter: Secondary | ICD-10-CM | POA: Diagnosis not present

## 2018-05-22 DIAGNOSIS — D696 Thrombocytopenia, unspecified: Secondary | ICD-10-CM | POA: Diagnosis not present

## 2018-05-22 DIAGNOSIS — I5020 Unspecified systolic (congestive) heart failure: Secondary | ICD-10-CM | POA: Diagnosis not present

## 2018-07-14 DIAGNOSIS — H401131 Primary open-angle glaucoma, bilateral, mild stage: Secondary | ICD-10-CM | POA: Diagnosis not present

## 2018-07-14 DIAGNOSIS — H26493 Other secondary cataract, bilateral: Secondary | ICD-10-CM | POA: Diagnosis not present

## 2018-07-14 DIAGNOSIS — Z961 Presence of intraocular lens: Secondary | ICD-10-CM | POA: Diagnosis not present

## 2018-07-14 DIAGNOSIS — D3132 Benign neoplasm of left choroid: Secondary | ICD-10-CM | POA: Diagnosis not present

## 2018-08-01 ENCOUNTER — Other Ambulatory Visit: Payer: Self-pay | Admitting: Cardiology

## 2018-08-04 NOTE — Telephone Encounter (Signed)
72 M, 100.8 kg SCr 0.94 (10-19).  CrCl 99.8

## 2018-08-20 NOTE — Assessment & Plan Note (Signed)
CLL stage I, lymphocytosis and lymphadenopathy diagnosed 07/31/2009, initial CT scan revealed lymphadenopathy and mildly enlarged spleen.  Lab review:   Patient understands that his lymphocyte count will continue to fluctuate up and down.  Treatment plan: Observation. I discussed with him the indications for treatment would be rapid doubling time on development of severe anemia thrombocytopenia, rapidly enlarging lymphadenopathy, B symptoms like fevers chills night sweats and weight loss.  Chronic thrombocytopenia: Probably low-grade ITP.His platelet count today is 139which is stable.  Return to clinic in 1 year for follow-up with blood tests.

## 2018-08-22 ENCOUNTER — Other Ambulatory Visit: Payer: Self-pay | Admitting: Cardiology

## 2018-08-26 ENCOUNTER — Other Ambulatory Visit: Payer: Self-pay

## 2018-08-26 DIAGNOSIS — C9110 Chronic lymphocytic leukemia of B-cell type not having achieved remission: Secondary | ICD-10-CM

## 2018-08-26 NOTE — Progress Notes (Signed)
Patient Care Team: Hulan Fess, MD as PCP - General (Family Medicine) Martinique, [name], MD as PCP - Cardiology (Cardiology)  DIAGNOSIS:    ICD-10-CM   1. CLL (chronic lymphocytic leukemia) (HCC)  C91.10 traMADol (ULTRAM) 50 MG tablet    CBC with Differential (Cancer Center Only)    CMP ([place] only)    Lactate dehydrogenase (LDH)    CHIEF COMPLIANT: Follow-up of CLL  INTERVAL HISTORY: Rodney Burns is a 73 y.o. with above-mentioned history of CLL who is currently on surveillance. He presents to the clinic today for annual follow-up to review labs.  He reports no major problems or concerns.  REVIEW OF SYSTEMS:   Constitutional: Denies fevers, chills or abnormal weight loss Eyes: Denies blurriness of vision Ears, nose, mouth, throat, and face: Denies mucositis or sore throat Respiratory: Denies cough, dyspnea or wheezes Cardiovascular: Denies palpitation, chest discomfort Gastrointestinal: Denies nausea, heartburn or change in bowel habits Skin: Denies abnormal skin rashes Lymphatics: Denies new lymphadenopathy or easy bruising Neurological: Denies numbness, tingling or new weaknesses Behavioral/Psych: Mood is stable, no new changes  Extremities: No lower extremity edema All other systems were reviewed with the patient and are negative.  I have reviewed the past medical history, past surgical history, social history and family history with the patient and they are unchanged from previous note.  ALLERGIES:  is allergic to penicillins; ace inhibitors; darvocet [propoxyphene n-acetaminophen]; and sulfa antibiotics.  MEDICATIONS:  Current Outpatient Medications  Medication Sig Dispense Refill  . Coenzyme Q10 (COQ-10) 200 MG CAPS Take 200 mg by mouth daily.    [name] Calcium (STOOL SOFTENER PO) Take by mouth 2 (two) times daily.    [name] ezetimibe-simvastatin (VYTORIN) 10-20 MG tablet TAKE 1 TABLET AT BEDTIME 90 tablet 3  . Fiber, Guar Gum, CHEW Chew 1 capsule by mouth  daily.    [name] latanoprost (XALATAN) 0.005 % ophthalmic solution Place 1 drop into both eyes at bedtime.     . Magnesium 250 MG TABS Take 500 mg by mouth at bedtime.    . pramipexole (MIRAPEX) 0.5 MG tablet Take 1.5 mg by mouth daily.     . pramipexole (MIRAPEX) 1.5 MG tablet     . RABEprazole (ACIPHEX) 20 MG tablet Take 1 tablet (20 mg total) by mouth daily. 90 tablet 3  . sacubitril-valsartan (ENTRESTO) 49-51 MG Take 1 tablet by mouth 2 (two) times daily. 180 tablet 3  . TOPROL XL 25 MG 24 hr tablet TAKE 1 TABLET DAILY 90 tablet 0  . traMADol (ULTRAM) 50 MG tablet Take 1 tablet (50 mg total) by mouth every 6 (six) hours as needed for moderate pain. 120 tablet 3  . XARELTO 20 MG TABS tablet TAKE 1 TABLET DAILY WITH SUPPER 90 tablet 1   No current facility-administered medications for this visit.     PHYSICAL EXAMINATION: ECOG PERFORMANCE STATUS: 0 - Asymptomatic  Vitals:   08/27/18 0942  BP: (!) 110/53  Pulse: 65  Resp: 18  Temp: 98.2 F (36.8 C)  SpO2: 97%   Filed Weights   08/27/18 0942  Weight: 215 lb 3.2 oz (97.6 kg)    GENERAL: alert, no distress and comfortable SKIN: skin color, texture, turgor are normal, no rashes or significant lesions EYES: normal, Conjunctiva are pink and non-injected, sclera clear OROPHARYNX: no exudate, no erythema and lips, buccal mucosa, and tongue normal  NECK: supple, thyroid normal size, non-tender, without nodularity LYMPH: no palpable lymphadenopathy in the cervical, axillary or inguinal LUNGS:  clear to auscultation and percussion with normal breathing effort HEART: regular rate & rhythm and no murmurs and no lower extremity edema ABDOMEN: abdomen soft, non-tender and normal bowel sounds MUSCULOSKELETAL: no cyanosis of digits and no clubbing  NEURO: alert & oriented x 3 with fluent speech, no focal motor/sensory deficits EXTREMITIES: No lower extremity edema  LABORATORY DATA:  I have reviewed the data as listed CMP Latest Ref Rng &  Units 11/06/2017 10/16/2017 08/21/2017  Glucose 65 - 99 mg/dL 131(H) 108(H) 117(H)  BUN 8 - 27 mg/dL 13 12 18   Creatinine 0.76 - 1.27 mg/dL 0.94 0.91 0.93  Sodium 134 - 144 mmol/L 140 144 142  Potassium 3.5 - 5.2 mmol/L 4.8 4.5 4.5  Chloride 96 - 106 mmol/L 101 106 107  CO2 20 - 29 mmol/L 23 23 26   Calcium 8.6 - 10.2 mg/dL 9.7 8.9 9.4  Total Protein 6.0 - 8.5 g/dL - 6.1 6.5  Total Bilirubin 0.0 - 1.2 mg/dL - 0.8 0.7  Alkaline Phos 39 - 117 IU/L - 38(L) 40  AST 0 - 40 IU/L - 16 19  ALT 0 - 44 IU/L - 20 24    Lab Results  Component Value Date   WBC 19.7 (H) 08/27/2018   HGB 13.8 08/27/2018   HCT 44.8 08/27/2018   MCV 86.5 08/27/2018   PLT 115 (L) 08/27/2018   NEUTROABS 4.1 08/27/2018    ASSESSMENT & PLAN:  CLL (chronic lymphocytic leukemia) (HCC) CLL stage I, lymphocytosis and lymphadenopathy diagnosed 07/31/2009, initial CT scan revealed lymphadenopathy and mildly enlarged spleen.  Lab review: stable blood counts Pl: 113  Patient understands that his lymphocyte count will continue to fluctuate up and down.  Treatment plan: Observation. I discussed with him the indications for treatment would be rapid doubling time on development of severe anemia thrombocytopenia, rapidly enlarging lymphadenopathy, B symptoms like fevers chills night sweats and weight loss.  Chronic thrombocytopenia: Probably low-grade ITP.His platelet count today is 139which is stable.  Return to clinic in 1 year for follow-up with blood tests.    Orders Placed This Encounter  Procedures  . CBC with Differential (Cancer Center Only)    Standing Status:   Future    Standing Expiration Date:   08/27/2019  . CMP ([place] only)    Standing Status:   Future    Standing Expiration Date:   08/27/2019  . Lactate dehydrogenase (LDH)    Standing Status:   Future    Standing Expiration Date:   08/27/2019   The patient has a good understanding of the overall plan. he agrees with it. he will call with  any problems that may develop before the next visit here.  Nicholas Lose, MD 08/27/2018  Julious Oka Dorshimer am acting as scribe for Dr. Nicholas Lose.  I have reviewed the above documentation for accuracy and completeness, and I agree with the above.

## 2018-08-27 ENCOUNTER — Inpatient Hospital Stay (HOSPITAL_BASED_OUTPATIENT_CLINIC_OR_DEPARTMENT_OTHER): Payer: Medicare Other | Admitting: Hematology and Oncology

## 2018-08-27 ENCOUNTER — Inpatient Hospital Stay: Payer: Medicare Other | Attending: Hematology and Oncology

## 2018-08-27 ENCOUNTER — Telehealth: Payer: Self-pay | Admitting: Hematology and Oncology

## 2018-08-27 ENCOUNTER — Other Ambulatory Visit: Payer: Self-pay

## 2018-08-27 DIAGNOSIS — I255 Ischemic cardiomyopathy: Secondary | ICD-10-CM | POA: Diagnosis not present

## 2018-08-27 DIAGNOSIS — Z7901 Long term (current) use of anticoagulants: Secondary | ICD-10-CM | POA: Diagnosis not present

## 2018-08-27 DIAGNOSIS — C9110 Chronic lymphocytic leukemia of B-cell type not having achieved remission: Secondary | ICD-10-CM | POA: Diagnosis not present

## 2018-08-27 DIAGNOSIS — Z79899 Other long term (current) drug therapy: Secondary | ICD-10-CM | POA: Diagnosis not present

## 2018-08-27 DIAGNOSIS — D696 Thrombocytopenia, unspecified: Secondary | ICD-10-CM | POA: Diagnosis not present

## 2018-08-27 LAB — CBC WITH DIFFERENTIAL (CANCER CENTER ONLY)
Abs Immature Granulocytes: 0.03 10*3/uL (ref 0.00–0.07)
Basophils Absolute: 0 10*3/uL (ref 0.0–0.1)
Basophils Relative: 0 %
Eosinophils Absolute: 0.1 10*3/uL (ref 0.0–0.5)
Eosinophils Relative: 1 %
HCT: 44.8 % (ref 39.0–52.0)
Hemoglobin: 13.8 g/dL (ref 13.0–17.0)
Immature Granulocytes: 0 %
Lymphocytes Relative: 75 %
Lymphs Abs: 14.9 10*3/uL — ABNORMAL HIGH (ref 0.7–4.0)
MCH: 26.6 pg (ref 26.0–34.0)
MCHC: 30.8 g/dL (ref 30.0–36.0)
MCV: 86.5 fL (ref 80.0–100.0)
Monocytes Absolute: 0.5 10*3/uL (ref 0.1–1.0)
Monocytes Relative: 3 %
Neutro Abs: 4.1 10*3/uL (ref 1.7–7.7)
Neutrophils Relative %: 21 %
Platelet Count: 115 10*3/uL — ABNORMAL LOW (ref 150–400)
RBC: 5.18 MIL/uL (ref 4.22–5.81)
RDW: 14.8 % (ref 11.5–15.5)
WBC Count: 19.7 10*3/uL — ABNORMAL HIGH (ref 4.0–10.5)
nRBC: 0 % (ref 0.0–0.2)

## 2018-08-27 LAB — CMP (CANCER CENTER ONLY)
ALT: 22 U/L (ref 0–44)
AST: 19 U/L (ref 15–41)
Albumin: 4.1 g/dL (ref 3.5–5.0)
Alkaline Phosphatase: 33 U/L — ABNORMAL LOW (ref 38–126)
Anion gap: 10 (ref 5–15)
BUN: 14 mg/dL (ref 8–23)
CO2: 23 mmol/L (ref 22–32)
Calcium: 9.3 mg/dL (ref 8.9–10.3)
Chloride: 105 mmol/L (ref 98–111)
Creatinine: 0.94 mg/dL (ref 0.61–1.24)
GFR, Est AFR Am: >60 mL/min (ref >60)
GFR, Estimated: >60 mL/min (ref >60)
Glucose, Bld: 133 mg/dL — ABNORMAL HIGH (ref 70–99)
Potassium: 4.8 mmol/L (ref 3.5–5.1)
Sodium: 138 mmol/L (ref 135–145)
Total Bilirubin: 1 mg/dL (ref 0.3–1.2)
Total Protein: 6.6 g/dL (ref 6.5–8.1)

## 2018-08-27 MED ORDER — TRAMADOL HCL 50 MG PO TABS: 50.0000 mg | ORAL_TABLET | Freq: Four times a day (QID) | ORAL | 3 refills | Status: DC | PRN

## 2018-08-27 NOTE — Telephone Encounter (Signed)
I could not reach regarding year appointment

## 2018-09-04 DIAGNOSIS — G2581 Restless legs syndrome: Secondary | ICD-10-CM | POA: Diagnosis not present

## 2018-09-04 DIAGNOSIS — I2510 Atherosclerotic heart disease of native coronary artery without angina pectoris: Secondary | ICD-10-CM | POA: Diagnosis not present

## 2018-09-04 DIAGNOSIS — Z86010 Personal history of colonic polyps: Secondary | ICD-10-CM | POA: Diagnosis not present

## 2018-09-04 DIAGNOSIS — I5020 Unspecified systolic (congestive) heart failure: Secondary | ICD-10-CM | POA: Diagnosis not present

## 2018-09-04 DIAGNOSIS — E785 Hyperlipidemia, unspecified: Secondary | ICD-10-CM | POA: Diagnosis not present

## 2018-09-04 DIAGNOSIS — C9110 Chronic lymphocytic leukemia of B-cell type not having achieved remission: Secondary | ICD-10-CM | POA: Diagnosis not present

## 2018-09-04 DIAGNOSIS — R7301 Impaired fasting glucose: Secondary | ICD-10-CM | POA: Diagnosis not present

## 2018-09-04 DIAGNOSIS — I10 Essential (primary) hypertension: Secondary | ICD-10-CM | POA: Diagnosis not present

## 2018-09-04 DIAGNOSIS — D696 Thrombocytopenia, unspecified: Secondary | ICD-10-CM | POA: Diagnosis not present

## 2018-09-04 DIAGNOSIS — I4892 Unspecified atrial flutter: Secondary | ICD-10-CM | POA: Diagnosis not present

## 2018-10-06 ENCOUNTER — Other Ambulatory Visit: Payer: Self-pay | Admitting: Cardiology

## 2018-10-08 DIAGNOSIS — Z23 Encounter for immunization: Secondary | ICD-10-CM | POA: Diagnosis not present

## 2018-10-16 DIAGNOSIS — I2510 Atherosclerotic heart disease of native coronary artery without angina pectoris: Secondary | ICD-10-CM | POA: Diagnosis not present

## 2018-10-16 DIAGNOSIS — E785 Hyperlipidemia, unspecified: Secondary | ICD-10-CM | POA: Diagnosis not present

## 2018-10-16 DIAGNOSIS — I5020 Unspecified systolic (congestive) heart failure: Secondary | ICD-10-CM | POA: Diagnosis not present

## 2018-10-16 DIAGNOSIS — I10 Essential (primary) hypertension: Secondary | ICD-10-CM | POA: Diagnosis not present

## 2018-10-28 DIAGNOSIS — E785 Hyperlipidemia, unspecified: Secondary | ICD-10-CM | POA: Diagnosis not present

## 2018-10-28 DIAGNOSIS — I5020 Unspecified systolic (congestive) heart failure: Secondary | ICD-10-CM | POA: Diagnosis not present

## 2018-10-28 DIAGNOSIS — I2510 Atherosclerotic heart disease of native coronary artery without angina pectoris: Secondary | ICD-10-CM | POA: Diagnosis not present

## 2018-10-28 DIAGNOSIS — I10 Essential (primary) hypertension: Secondary | ICD-10-CM | POA: Diagnosis not present

## 2018-11-16 ENCOUNTER — Telehealth: Payer: Self-pay | Admitting: Cardiology

## 2018-11-16 NOTE — Telephone Encounter (Signed)
Patient on Entresto. confirmed with patient he is taking it.  NO need to take losartan at this time.

## 2018-11-16 NOTE — Telephone Encounter (Signed)
New message   Patient is wanting to know if he should be taking losartan Potassium 50 mg? Please call.

## 2018-11-22 ENCOUNTER — Other Ambulatory Visit: Payer: Self-pay | Admitting: Cardiology

## 2018-12-14 ENCOUNTER — Other Ambulatory Visit: Payer: Self-pay | Admitting: Cardiology

## 2018-12-14 ENCOUNTER — Other Ambulatory Visit: Payer: Self-pay | Admitting: Hematology and Oncology

## 2018-12-14 DIAGNOSIS — C9110 Chronic lymphocytic leukemia of B-cell type not having achieved remission: Secondary | ICD-10-CM

## 2018-12-14 MED ORDER — TRAMADOL HCL 50 MG PO TABS: 50.0000 mg | ORAL_TABLET | Freq: Four times a day (QID) | ORAL | 0 refills | Status: DC | PRN

## 2018-12-19 ENCOUNTER — Other Ambulatory Visit: Payer: Self-pay | Admitting: Cardiology

## 2018-12-21 DIAGNOSIS — I5020 Unspecified systolic (congestive) heart failure: Secondary | ICD-10-CM | POA: Diagnosis not present

## 2018-12-21 DIAGNOSIS — I2510 Atherosclerotic heart disease of native coronary artery without angina pectoris: Secondary | ICD-10-CM | POA: Diagnosis not present

## 2018-12-21 DIAGNOSIS — R05 Cough: Secondary | ICD-10-CM | POA: Diagnosis not present

## 2018-12-21 DIAGNOSIS — Z87891 Personal history of nicotine dependence: Secondary | ICD-10-CM | POA: Diagnosis not present

## 2018-12-24 ENCOUNTER — Other Ambulatory Visit: Payer: Self-pay

## 2018-12-24 ENCOUNTER — Encounter (HOSPITAL_COMMUNITY): Payer: Self-pay | Admitting: Emergency Medicine

## 2018-12-24 ENCOUNTER — Inpatient Hospital Stay (HOSPITAL_COMMUNITY)
Admission: EM | Admit: 2018-12-24 | Discharge: 2018-12-30 | DRG: 871 | Disposition: A | Discharge status: Home / Self Care | Payer: Medicare Other | Attending: Internal Medicine | Admitting: Internal Medicine

## 2018-12-24 ENCOUNTER — Emergency Department (HOSPITAL_COMMUNITY): Payer: Medicare Other

## 2018-12-24 DIAGNOSIS — E7800 Pure hypercholesterolemia, unspecified: Secondary | ICD-10-CM | POA: Diagnosis not present

## 2018-12-24 DIAGNOSIS — R0902 Hypoxemia: Secondary | ICD-10-CM | POA: Diagnosis not present

## 2018-12-24 DIAGNOSIS — R7401 Elevation of levels of liver transaminase levels: Secondary | ICD-10-CM | POA: Diagnosis present

## 2018-12-24 DIAGNOSIS — I4891 Unspecified atrial fibrillation: Secondary | ICD-10-CM | POA: Diagnosis not present

## 2018-12-24 DIAGNOSIS — C9110 Chronic lymphocytic leukemia of B-cell type not having achieved remission: Secondary | ICD-10-CM

## 2018-12-24 DIAGNOSIS — I5022 Chronic systolic (congestive) heart failure: Secondary | ICD-10-CM | POA: Diagnosis not present

## 2018-12-24 DIAGNOSIS — J1289 Other viral pneumonia: Secondary | ICD-10-CM | POA: Diagnosis not present

## 2018-12-24 DIAGNOSIS — H409 Unspecified glaucoma: Secondary | ICD-10-CM | POA: Diagnosis present

## 2018-12-24 DIAGNOSIS — I10 Essential (primary) hypertension: Secondary | ICD-10-CM | POA: Diagnosis present

## 2018-12-24 DIAGNOSIS — Z87891 Personal history of nicotine dependence: Secondary | ICD-10-CM | POA: Diagnosis not present

## 2018-12-24 DIAGNOSIS — I255 Ischemic cardiomyopathy: Secondary | ICD-10-CM | POA: Diagnosis not present

## 2018-12-24 DIAGNOSIS — Z7901 Long term (current) use of anticoagulants: Secondary | ICD-10-CM | POA: Diagnosis not present

## 2018-12-24 DIAGNOSIS — E875 Hyperkalemia: Secondary | ICD-10-CM | POA: Diagnosis present

## 2018-12-24 DIAGNOSIS — Z951 Presence of aortocoronary bypass graft: Secondary | ICD-10-CM

## 2018-12-24 DIAGNOSIS — I482 Chronic atrial fibrillation, unspecified: Secondary | ICD-10-CM | POA: Diagnosis present

## 2018-12-24 DIAGNOSIS — A4189 Other specified sepsis: Secondary | ICD-10-CM | POA: Diagnosis not present

## 2018-12-24 DIAGNOSIS — R0602 Shortness of breath: Secondary | ICD-10-CM | POA: Diagnosis not present

## 2018-12-24 DIAGNOSIS — K219 Gastro-esophageal reflux disease without esophagitis: Secondary | ICD-10-CM | POA: Diagnosis present

## 2018-12-24 DIAGNOSIS — R7989 Other specified abnormal findings of blood chemistry: Secondary | ICD-10-CM | POA: Diagnosis present

## 2018-12-24 DIAGNOSIS — Z8249 Family history of ischemic heart disease and other diseases of the circulatory system: Secondary | ICD-10-CM

## 2018-12-24 DIAGNOSIS — D696 Thrombocytopenia, unspecified: Secondary | ICD-10-CM | POA: Diagnosis present

## 2018-12-24 DIAGNOSIS — Z6830 Body mass index (BMI) 30.0-30.9, adult: Secondary | ICD-10-CM | POA: Diagnosis not present

## 2018-12-24 DIAGNOSIS — J1282 Pneumonia due to coronavirus disease 2019: Secondary | ICD-10-CM

## 2018-12-24 DIAGNOSIS — E669 Obesity, unspecified: Secondary | ICD-10-CM | POA: Diagnosis present

## 2018-12-24 DIAGNOSIS — R05 Cough: Secondary | ICD-10-CM | POA: Diagnosis not present

## 2018-12-24 DIAGNOSIS — I2510 Atherosclerotic heart disease of native coronary artery without angina pectoris: Secondary | ICD-10-CM | POA: Diagnosis not present

## 2018-12-24 DIAGNOSIS — U071 COVID-19: Secondary | ICD-10-CM | POA: Diagnosis not present

## 2018-12-24 DIAGNOSIS — Z880 Allergy status to penicillin: Secondary | ICD-10-CM

## 2018-12-24 DIAGNOSIS — E785 Hyperlipidemia, unspecified: Secondary | ICD-10-CM | POA: Diagnosis present

## 2018-12-24 DIAGNOSIS — I5020 Unspecified systolic (congestive) heart failure: Secondary | ICD-10-CM | POA: Diagnosis not present

## 2018-12-24 DIAGNOSIS — J9601 Acute respiratory failure with hypoxia: Secondary | ICD-10-CM | POA: Diagnosis not present

## 2018-12-24 DIAGNOSIS — I110 Hypertensive heart disease with heart failure: Secondary | ICD-10-CM | POA: Diagnosis present

## 2018-12-24 DIAGNOSIS — Z856 Personal history of leukemia: Secondary | ICD-10-CM

## 2018-12-24 DIAGNOSIS — D72829 Elevated white blood cell count, unspecified: Secondary | ICD-10-CM | POA: Diagnosis present

## 2018-12-24 HISTORY — DX: Cardiac arrhythmia, unspecified: I49.9

## 2018-12-24 LAB — COMPREHENSIVE METABOLIC PANEL
ALT: 507 U/L — ABNORMAL HIGH (ref 0–44)
AST: 363 U/L — ABNORMAL HIGH (ref 15–41)
Albumin: 3.3 g/dL — ABNORMAL LOW (ref 3.5–5.0)
Alkaline Phosphatase: 56 U/L (ref 38–126)
Anion gap: 13 (ref 5–15)
BUN: 19 mg/dL (ref 8–23)
CO2: 23 mmol/L (ref 22–32)
Calcium: 8.7 mg/dL — ABNORMAL LOW (ref 8.9–10.3)
Chloride: 98 mmol/L (ref 98–111)
Creatinine, Ser: 1 mg/dL (ref 0.61–1.24)
GFR calc Af Amer: >60 mL/min (ref >60)
GFR calc non Af Amer: >60 mL/min (ref >60)
Glucose, Bld: 110 mg/dL — ABNORMAL HIGH (ref 70–99)
Potassium: 4.5 mmol/L (ref 3.5–5.1)
Sodium: 134 mmol/L — ABNORMAL LOW (ref 135–145)
Total Bilirubin: 2 mg/dL — ABNORMAL HIGH (ref 0.3–1.2)
Total Protein: 6.7 g/dL (ref 6.5–8.1)

## 2018-12-24 LAB — CBC WITH DIFFERENTIAL/PLATELET
Abs Immature Granulocytes: 0.06 10*3/uL (ref 0.00–0.07)
Basophils Absolute: 0 10*3/uL (ref 0.0–0.1)
Basophils Relative: 0 %
Eosinophils Absolute: 0 10*3/uL (ref 0.0–0.5)
Eosinophils Relative: 0 %
HCT: 49.2 % (ref 39.0–52.0)
Hemoglobin: 15.6 g/dL (ref 13.0–17.0)
Immature Granulocytes: 0 %
Lymphocytes Relative: 62 %
Lymphs Abs: 12.2 10*3/uL — ABNORMAL HIGH (ref 0.7–4.0)
MCH: 26.4 pg (ref 26.0–34.0)
MCHC: 31.7 g/dL (ref 30.0–36.0)
MCV: 83.1 fL (ref 80.0–100.0)
Monocytes Absolute: 0.5 10*3/uL (ref 0.1–1.0)
Monocytes Relative: 3 %
Neutro Abs: 6.9 10*3/uL (ref 1.7–7.7)
Neutrophils Relative %: 35 %
Platelets: 227 10*3/uL (ref 150–400)
RBC: 5.92 MIL/uL — ABNORMAL HIGH (ref 4.22–5.81)
RDW: 14.2 % (ref 11.5–15.5)
WBC: 19.7 10*3/uL — ABNORMAL HIGH (ref 4.0–10.5)
nRBC: 0 % (ref 0.0–0.2)

## 2018-12-24 LAB — BASIC METABOLIC PANEL WITH GFR
Anion gap: 15 (ref 5–15)
BUN: 19 mg/dL (ref 8–23)
CO2: 19 mmol/L — ABNORMAL LOW (ref 22–32)
Calcium: 8.7 mg/dL — ABNORMAL LOW (ref 8.9–10.3)
Chloride: 101 mmol/L (ref 98–111)
Creatinine, Ser: 0.96 mg/dL (ref 0.61–1.24)
GFR calc Af Amer: >60 mL/min
GFR calc non Af Amer: >60 mL/min
Glucose, Bld: 107 mg/dL — ABNORMAL HIGH (ref 70–99)
Potassium: 4.5 mmol/L (ref 3.5–5.1)
Sodium: 135 mmol/L (ref 135–145)

## 2018-12-24 LAB — LACTIC ACID, PLASMA
Lactic Acid, Venous: 2.6 mmol/L (ref 0.5–1.9)
Lactic Acid, Venous: 2.6 mmol/L (ref 0.5–1.9)

## 2018-12-24 LAB — TYPE AND SCREEN
ABO/RH(D): A POS
Antibody Screen: NEGATIVE

## 2018-12-24 LAB — ABO/RH: ABO/RH(D): A POS

## 2018-12-24 LAB — CBC
HCT: 49 % (ref 39.0–52.0)
Hemoglobin: 15.3 g/dL (ref 13.0–17.0)
MCH: 26.3 pg (ref 26.0–34.0)
MCHC: 31.2 g/dL (ref 30.0–36.0)
MCV: 84.3 fL (ref 80.0–100.0)
Platelets: 215 10*3/uL (ref 150–400)
RBC: 5.81 MIL/uL (ref 4.22–5.81)
RDW: 14.4 % (ref 11.5–15.5)
WBC: 23.2 10*3/uL — ABNORMAL HIGH (ref 4.0–10.5)
nRBC: 0 % (ref 0.0–0.2)

## 2018-12-24 LAB — FIBRINOGEN: Fibrinogen: 744 mg/dL — ABNORMAL HIGH (ref 210–475)

## 2018-12-24 LAB — FERRITIN
Ferritin: 510 ng/mL — ABNORMAL HIGH (ref 24–336)
Ferritin: 490 ng/mL — ABNORMAL HIGH (ref 24–336)

## 2018-12-24 LAB — D-DIMER, QUANTITATIVE: D-Dimer, Quant: 2.12 ug/mL-FEU — ABNORMAL HIGH (ref 0.00–0.50)

## 2018-12-24 LAB — POC SARS CORONAVIRUS 2 AG -  ED: SARS Coronavirus 2 Ag: POSITIVE — AB

## 2018-12-24 LAB — TRIGLYCERIDES: Triglycerides: 93 mg/dL (ref <150)

## 2018-12-24 LAB — C-REACTIVE PROTEIN: CRP: 7.3 mg/dL — ABNORMAL HIGH (ref <1.0)

## 2018-12-24 LAB — HEMOGLOBIN A1C
Hgb A1c MFr Bld: 6.7 % — ABNORMAL HIGH (ref 4.8–5.6)
Mean Plasma Glucose: 145.59 mg/dL

## 2018-12-24 LAB — PROCALCITONIN
Procalcitonin: <0.1 ng/mL
Procalcitonin: <0.1 ng/mL

## 2018-12-24 LAB — LACTATE DEHYDROGENASE: LDH: 564 U/L — ABNORMAL HIGH (ref 98–192)

## 2018-12-24 MED ORDER — EZETIMIBE 10 MG PO TABS
10.0000 mg | ORAL_TABLET | Freq: Every day | ORAL | Status: DC
  Administered 2018-12-24: 10 mg via ORAL
  Filled 2018-12-24 (×2): qty 1

## 2018-12-24 MED ORDER — LEVOFLOXACIN IN D5W 750 MG/150ML IV SOLN
750.0000 mg | INTRAVENOUS | Status: DC
  Administered 2018-12-24: 750 mg via INTRAVENOUS
  Filled 2018-12-24: qty 150

## 2018-12-24 MED ORDER — METOPROLOL TARTRATE 5 MG/5ML IV SOLN: 5.0000 mg | Freq: Once | INTRAVENOUS | Status: DC

## 2018-12-24 MED ORDER — METOPROLOL SUCCINATE ER 25 MG PO TB24
25.0000 mg | ORAL_TABLET | Freq: Every day | ORAL | Status: DC
  Administered 2018-12-25 – 2018-12-27 (×3): 25 mg via ORAL
  Filled 2018-12-24 (×3): qty 1

## 2018-12-24 MED ORDER — SODIUM CHLORIDE 0.9 % IV BOLUS
250.0000 mL | Freq: Once | INTRAVENOUS | Status: AC
  Administered 2018-12-24: 250 mL via INTRAVENOUS

## 2018-12-24 MED ORDER — TRAMADOL HCL 50 MG PO TABS: 50.0000 mg | ORAL_TABLET | Freq: Four times a day (QID) | ORAL | Status: DC | PRN

## 2018-12-24 MED ORDER — MAGNESIUM OXIDE 400 (241.3 MG) MG PO TABS
400.0000 mg | ORAL_TABLET | Freq: Every day | ORAL | Status: DC
  Administered 2018-12-24 – 2018-12-30 (×7): 400 mg via ORAL
  Filled 2018-12-24 (×7): qty 1

## 2018-12-24 MED ORDER — FOLIC ACID 5 MG/ML IJ SOLN
1.0000 mg | Freq: Every day | INTRAMUSCULAR | Status: DC
  Administered 2018-12-24 – 2018-12-25 (×2): 1 mg via INTRAVENOUS
  Filled 2018-12-24 (×2): qty 0.2

## 2018-12-24 MED ORDER — ONDANSETRON HCL 4 MG/2ML IJ SOLN: 4.0000 mg | Freq: Four times a day (QID) | INTRAMUSCULAR | Status: DC | PRN

## 2018-12-24 MED ORDER — SODIUM CHLORIDE 0.9 % IV SOLN
INTRAVENOUS | Status: DC
  Administered 2018-12-24: 17:00:00 via INTRAVENOUS

## 2018-12-24 MED ORDER — ONDANSETRON HCL 4 MG PO TABS: 4.0000 mg | ORAL_TABLET | Freq: Four times a day (QID) | ORAL | Status: DC | PRN

## 2018-12-24 MED ORDER — EZETIMIBE-SIMVASTATIN 10-20 MG PO TABS: 1.0000 | ORAL_TABLET | Freq: Every day | ORAL | Status: DC

## 2018-12-24 MED ORDER — SODIUM CHLORIDE 0.9% FLUSH: 3.0000 mL | Freq: Once | INTRAVENOUS | Status: DC

## 2018-12-24 MED ORDER — ACETAMINOPHEN 325 MG PO TABS
650.0000 mg | ORAL_TABLET | Freq: Once | ORAL | Status: AC
  Administered 2018-12-24: 650 mg via ORAL
  Filled 2018-12-24: qty 2

## 2018-12-24 MED ORDER — SODIUM CHLORIDE 0.9 % IV SOLN
1000.0000 mL | INTRAVENOUS | Status: DC
  Administered 2018-12-24: 1000 mL via INTRAVENOUS

## 2018-12-24 MED ORDER — INSULIN ASPART 100 UNIT/ML ~~LOC~~ SOLN
0.0000 [IU] | Freq: Three times a day (TID) | SUBCUTANEOUS | Status: DC
  Administered 2018-12-25: 2 [IU] via SUBCUTANEOUS
  Administered 2018-12-25: 3 [IU] via SUBCUTANEOUS
  Administered 2018-12-25 – 2018-12-26 (×2): 2 [IU] via SUBCUTANEOUS
  Administered 2018-12-26: 1 [IU] via SUBCUTANEOUS
  Administered 2018-12-26: 2 [IU] via SUBCUTANEOUS
  Administered 2018-12-27: 1 [IU] via SUBCUTANEOUS
  Administered 2018-12-27: 5 [IU] via SUBCUTANEOUS
  Administered 2018-12-28: 2 [IU] via SUBCUTANEOUS
  Administered 2018-12-28: 1 [IU] via SUBCUTANEOUS
  Administered 2018-12-29 – 2018-12-30 (×2): 2 [IU] via SUBCUTANEOUS

## 2018-12-24 MED ORDER — LATANOPROST 0.005 % OP SOLN
1.0000 [drp] | Freq: Every day | OPHTHALMIC | Status: DC
  Administered 2018-12-24 – 2018-12-30 (×7): 1 [drp] via OPHTHALMIC
  Filled 2018-12-24: qty 2.5

## 2018-12-24 MED ORDER — DEXAMETHASONE SODIUM PHOSPHATE 10 MG/ML IJ SOLN
6.0000 mg | INTRAMUSCULAR | Status: DC
  Administered 2018-12-24: 6 mg via INTRAVENOUS
  Filled 2018-12-24: qty 1

## 2018-12-24 MED ORDER — FUROSEMIDE 10 MG/ML IJ SOLN
40.0000 mg | Freq: Once | INTRAMUSCULAR | Status: AC
  Administered 2018-12-25: 40 mg via INTRAVENOUS
  Filled 2018-12-24: qty 4

## 2018-12-24 MED ORDER — DIPHENHYDRAMINE HCL 50 MG/ML IJ SOLN
25.0000 mg | Freq: Once | INTRAMUSCULAR | Status: AC
  Administered 2018-12-24: 25 mg via INTRAVENOUS
  Filled 2018-12-24: qty 1

## 2018-12-24 MED ORDER — SIMVASTATIN 20 MG PO TABS
20.0000 mg | ORAL_TABLET | Freq: Every day | ORAL | Status: DC
  Administered 2018-12-24: 20 mg via ORAL
  Filled 2018-12-24: qty 1

## 2018-12-24 MED ORDER — SODIUM CHLORIDE 0.9% FLUSH
3.0000 mL | Freq: Two times a day (BID) | INTRAVENOUS | Status: DC
  Administered 2018-12-24 – 2018-12-28 (×9): 3 mL via INTRAVENOUS
  Administered 2018-12-28: 21:00:00 via INTRAVENOUS
  Administered 2018-12-29 – 2018-12-30 (×4): 3 mL via INTRAVENOUS

## 2018-12-24 MED ORDER — PRAMIPEXOLE DIHYDROCHLORIDE 1.5 MG PO TABS
1.5000 mg | ORAL_TABLET | Freq: Every day | ORAL | Status: DC
  Administered 2018-12-25 – 2018-12-30 (×6): 1.5 mg via ORAL
  Filled 2018-12-24 (×7): qty 1

## 2018-12-24 MED ORDER — MAGNESIUM 200 MG PO TABS
500.0000 mg | ORAL_TABLET | Freq: Every day | ORAL | Status: DC
  Filled 2018-12-24: qty 3

## 2018-12-24 MED ORDER — GUAIFENESIN-DM 100-10 MG/5ML PO SYRP
10.0000 mL | ORAL_SOLUTION | ORAL | Status: DC | PRN
  Administered 2018-12-24: 10 mL via ORAL
  Filled 2018-12-24: qty 10

## 2018-12-24 MED ORDER — ACETAMINOPHEN 325 MG PO TABS: 650.0000 mg | ORAL_TABLET | Freq: Four times a day (QID) | ORAL | Status: DC | PRN

## 2018-12-24 MED ORDER — METHYLPREDNISOLONE SODIUM SUCC 40 MG IJ SOLR
40.0000 mg | Freq: Two times a day (BID) | INTRAMUSCULAR | Status: DC
  Administered 2018-12-24 – 2018-12-26 (×4): 40 mg via INTRAVENOUS
  Filled 2018-12-24 (×4): qty 1

## 2018-12-24 MED ORDER — RIVAROXABAN 20 MG PO TABS
20.0000 mg | ORAL_TABLET | Freq: Every day | ORAL | Status: DC
  Administered 2018-12-24 – 2018-12-30 (×7): 20 mg via ORAL
  Filled 2018-12-24 (×8): qty 1

## 2018-12-24 MED ORDER — SODIUM CHLORIDE 0.9% IV SOLUTION
Freq: Once | INTRAVENOUS | Status: AC
  Administered 2018-12-24: 21:00:00 via INTRAVENOUS

## 2018-12-24 MED ORDER — PANTOPRAZOLE SODIUM 40 MG PO TBEC
40.0000 mg | DELAYED_RELEASE_TABLET | Freq: Every day | ORAL | Status: DC
  Administered 2018-12-25 – 2018-12-30 (×6): 40 mg via ORAL
  Filled 2018-12-24 (×6): qty 1

## 2018-12-24 MED ORDER — THIAMINE HCL 100 MG/ML IJ SOLN
100.0000 mg | Freq: Every day | INTRAMUSCULAR | Status: DC
  Administered 2018-12-24 – 2018-12-25 (×2): 100 mg via INTRAVENOUS
  Filled 2018-12-24 (×2): qty 1
  Filled 2018-12-24: qty 2

## 2018-12-24 MED ORDER — IPRATROPIUM-ALBUTEROL 20-100 MCG/ACT IN AERS
1.0000 | INHALATION_SPRAY | Freq: Four times a day (QID) | RESPIRATORY_TRACT | Status: DC
  Administered 2018-12-24 – 2018-12-30 (×24): 1 via RESPIRATORY_TRACT
  Filled 2018-12-24: qty 4

## 2018-12-24 NOTE — ED Notes (Signed)
Pt is eating, sats dropped to 88% on 4L/m/[place]-- had a sneezing episode, which made pt more short of breath.

## 2018-12-24 NOTE — ED Provider Notes (Signed)
[facility] EMERGENCY DEPARTMENT Provider Note   CSN: [id number] Arrival date & time: 12/24/18  1032     History   Chief Complaint Chief Complaint  Patient presents with  . Shortness of Breath    HPI Rodney Burns is a 73 y.o. male.     HPI   History disease, A. fib, CLL presents today with, cough today with some shortness of his sats were in the 80s.  And dyspnea.  States that he noted some infection symptoms about a week ago.  He is complaining chiefly of shortness of breath.  He denies having fever.,  He has virtual visit today.  His sats are noted to be in the 80s and he will told to come to the ED.  He denies any headache, head injury, chest pain, nausea, vomiting, diarrhea.  He states his wife was sick for about 3 days but got better.  He denies any exposure to Covid.  His handicapped son also lives in the house.  He denies any previous cardiac testing.  He is not a smoker.  Past Medical History:  Diagnosis Date  . Atrial tachycardia ([place])    a. s/p DCCV 09/2009; b. s/p RFCA 03/2011; c. s/p DCCV 04/2011; d. s/p RFCA 09/17/11.  . Cataract   . Chronic systolic CHF (congestive heart failure) ([place])    a. 01/2015 Echo: EF 40-45%, antsept/infsept HK, triv AI, mild MR, mod dil LA, nl RV, mildly dil RA.  [name] CLL (chronic lymphoblastic leukemia)    Stage 0-1  . Coronary artery disease involving native coronary artery without angina pectoris    a. Status post CABG in 1994:By Dr. Cyndia Bent. LIMA - L Cx, seqSVG-Diag-LAD, and SVG-PDA-PL.  [name] Cough    Consistant with ACE inhibitor mediated cough  . Glaucoma (increased eye pressure) 1991  . History of tobacco abuse    quit 1994  . Hypercholesterolemia    Well Controlled  . Hypertension   . Ischemic cardiomyopathy    a. 02/2011 Echo: EF 40-45%;  b. 01/2015 Echo: EF 40-45%.  . Malaria 1972   Hx of  . Pericarditis    a. 01/2015-->Treated w/ ibuprofen/colchicine.  . Sleep-disordered breathing 06/20/2011    Patient Active  Problem List   Diagnosis Date Noted  . CAD (coronary artery disease) of artery bypass graft 12/07/2015  . Chronic anticoagulation 03/06/2015  . Pericarditis 02/15/2015  . Hx of CABG x 5 '94   . Cardiomyopathy, ischemic 01/28/2012  . Sleep-disordered breathing 06/20/2011  . Syncope and collapse 03/29/2011  . Atrial flutter-atypical   . CLL (chronic lymphocytic leukemia) ([place])   . Dyslipidemia   . Essential hypertension     Past Surgical History:  Procedure Laterality Date  . ATRIAL FLUTTER ABLATION N/A 04/11/2011   Procedure: ATRIAL FLUTTER ABLATION;  Surgeon: Deboraha Sprang, MD;  Location[facility] CATH LAB;  Service: Cardiovascular;  Laterality: N/A;  . ATRIAL FLUTTER ABLATION N/A 09/17/2011   Procedure: ATRIAL FLUTTER ABLATION;  Surgeon: Thompson Grayer, MD;  Location: [facility] CATH LAB;  Service: Cardiovascular;  Laterality: N/A;  . CARDIOVERSION  06/03/2011   Procedure: CARDIOVERSION;  Surgeon: Deboraha Sprang, MD;  Location: [facility];  Service: Cardiovascular;  Laterality: N/A;  . CATARACT EXTRACTION    . CORONARY ARTERY BYPASS GRAFT  1994   By Dr. Cyndia Bent. LIMA - L Cx, seqSVG-Diag-LAD, and SVG-PDA-PL  . ELECTROPHYSIOLOGY STUDY N/A 04/11/2011   Procedure: ELECTROPHYSIOLOGY STUDY;  Surgeon: Deboraha Sprang, MD;  Location: [facility] CATH LAB;  Service: Cardiovascular;  Laterality: N/A;  . EP study and ablation  2013   by Dr Caryl Comes, repeat ablation by Dr Rayann Heman  . NM MYOVIEW LTD  03/2014   scar in the inferior and anteroapical regions without ischemia. This is similar to finding on Myoview in 2013. EF is a little lower 39%>>32%.  [name] TRIGGER FINGER RELEASE  12/2016   left hand   . US ECHOCARDIOGRAPHY  09-07-09; 02/2011   a. Est EF 40-45%; b. EF 40-45%. Gr 2 DD. Mild LA dil        Home Medications    Prior to Admission medications   Medication Sig Start Date End Date Taking? Authorizing Provider  Coenzyme Q10 (COQ-10) 200 MG CAPS Take 200 mg by mouth daily.    [provider]  Docusate Calcium  (STOOL SOFTENER PO) Take by mouth 2 (two) times daily.    [provider]  ENTRESTO 49-51 MG TAKE 1 TABLET TWICE A DAY 10/06/18   Martinique, [name], MD  ezetimibe-simvastatin (VYTORIN) 10-20 MG tablet TAKE 1 TABLET AT BEDTIME 12/22/18   Martinique, [name], MD  Fiber, Guar Gum, CHEW Chew 1 capsule by mouth daily.    [provider]  latanoprost (XALATAN) 0.005 % ophthalmic solution Place 1 drop into both eyes at bedtime.     [provider]  Magnesium 250 MG TABS Take 500 mg by mouth at bedtime.    [provider]  pramipexole (MIRAPEX) 0.5 MG tablet Take 1.5 mg by mouth daily.     [provider]  pramipexole (MIRAPEX) 1.5 MG tablet  11/24/17   [provider]  RABEprazole (ACIPHEX) 20 MG tablet Take 1 tablet (20 mg total) by mouth daily. 01/28/12   Martinique, [name], MD  TOPROL XL 25 MG 24 hr tablet TAKE 1 TABLET DAILY (PLEASE MAKE ANNUAL APPOINTMENT WITH DOCTOR Martinique FOR FUTURE REFILLS [phone]. FIRST ATTEMPT) 12/14/18   Martinique, [name], MD  traMADol (ULTRAM) 50 MG tablet Take 1 tablet (50 mg total) by mouth every 6 (six) hours as needed for moderate pain. 12/14/18   Nicholas Lose, MD  XARELTO 20 MG TABS tablet TAKE 1 TABLET DAILY WITH SUPPER 08/04/18   Martinique, [name], MD    Family History Family History  Problem Relation Age of Onset  . Heart failure Father   . Cancer Brother        colon  . Down syndrome Son     Social History Social History   Tobacco Use  . Smoking status: Former Smoker    Packs/day: 2.00    Years: 30.00    Pack years: 60.00    Types: Cigarettes    Quit date: 01/22/1992    Years since quitting: 26.9  . Smokeless tobacco: Never Used  Substance Use Topics  . Alcohol use: No  . Drug use: No     Allergies   Penicillins, Ace inhibitors, Darvocet [propoxyphene n-acetaminophen], and Sulfa antibiotics   Review of Systems Review of Systems  Constitutional: Positive for fatigue.  Respiratory: Positive for cough.    Cardiovascular: Negative.   Gastrointestinal: Negative.   Endocrine: Negative.   Genitourinary: Negative.   Musculoskeletal: Negative.   Skin: Negative.   Allergic/Immunologic: Negative.   Neurological: Negative.   Hematological: Negative.   Psychiatric/Behavioral: Negative.   All other systems reviewed and are negative.    Physical Exam Updated Vital Signs BP (!) 144/81 (BP Location: Left Arm)   Pulse (!) 130   Temp 98.6 F (37  C)   SpO2 (!) 89%   Physical Exam Vitals signs and nursing note reviewed.  Constitutional:      Appearance: He is well-developed.  HENT:     Head: Normocephalic and atraumatic.     Right Ear: External ear normal.     Left Ear: External ear normal.     Nose: Nose normal.  Eyes:     Conjunctiva/sclera: Conjunctivae normal.     Pupils: Pupils are equal, round, and reactive to light.  Neck:     Musculoskeletal: Normal range of motion and neck supple.  Cardiovascular:     Rate and Rhythm: Tachycardia present. Rhythm irregular.     Heart sounds: Normal heart sounds.  Pulmonary:     Effort: Pulmonary effort is normal. Tachypnea present. No respiratory distress.     Breath sounds: Normal breath sounds. No wheezing.  Chest:     Chest wall: No tenderness.  Abdominal:     General: Bowel sounds are normal. There is no distension.     Palpations: Abdomen is soft. There is no mass.     Tenderness: There is no abdominal tenderness. There is no guarding.  Musculoskeletal: Normal range of motion.  Skin:    General: Skin is warm and dry.  Neurological:     Mental Status: He is alert and oriented to person, place, and time.     Motor: No abnormal muscle tone.     Coordination: Coordination normal.     Deep Tendon Reflexes: Reflexes are normal and symmetric.  Psychiatric:        Behavior: Behavior normal.        Thought Content: Thought content normal.        Judgment: Judgment normal.      ED Treatments / Results  Labs (all labs ordered are  listed, but only abnormal results are displayed) Labs Reviewed  CULTURE, BLOOD (ROUTINE X 2)  CULTURE, BLOOD (ROUTINE X 2)  BASIC METABOLIC PANEL  CBC  LACTIC ACID, PLASMA  LACTIC ACID, PLASMA  CBC WITH DIFFERENTIAL/PLATELET  COMPREHENSIVE METABOLIC PANEL  D-DIMER, QUANTITATIVE (NOT AT [facility])  PROCALCITONIN  LACTATE DEHYDROGENASE  FERRITIN  TRIGLYCERIDES  FIBRINOGEN  C-REACTIVE PROTEIN  POC SARS CORONAVIRUS 2 AG -  ED    EKG EKG Interpretation  Date/Time:  Thursday December 24 2018 10:45:03 EST Ventricular Rate:  124 PR Interval:    QRS Duration: 96 QT Interval:  320 QTC Calculation: 459 R Axis:   108 Text Interpretation: Atrial fibrillation with rapid ventricular response Rightward axis Cannot rule out Inferior infarct , age undetermined Anteroseptal infarct , age undetermined Abnormal ECG Confirmed by Pattricia Boss [phone]) on 12/24/2018 10:53:17 AM Also confirmed by Pattricia Boss [phone]), editor Hattie Perch ([id number])  on 12/24/2018 11:05:21 AM   Radiology Dg Chest Portable 1 View  Result Date: 12/24/2018 CLINICAL DATA:  Cough, shortness of breath EXAM: PORTABLE CHEST 1 VIEW COMPARISON:  03/20/2017 FINDINGS: Post CABG changes. Stable mild cardiomegaly. No focal airspace consolidation. No pleural effusion or pneumothorax. IMPRESSION: No acute cardiopulmonary findings. Electronically Signed   By: Davina Poke M.D.   On: 12/24/2018 11:13    Procedures .Critical Care Performed by: Pattricia Boss, MD Authorized by: Pattricia Boss, MD   Critical care provider statement:    Critical care time (minutes):  45   Critical care end time:  12/24/2018 12:26 PM   Critical care was necessary to treat or prevent imminent or life-threatening deterioration of the following conditions:  Respiratory failure   Critical care was  time spent personally by me on the following activities:  Discussions with consultants, evaluation of patient's response to treatment, examination of patient,  ordering and performing treatments and interventions, ordering and review of laboratory studies, ordering and review of radiographic studies, pulse oximetry, re-evaluation of patient's condition, obtaining history from patient or surrogate and review of old charts   (including critical care time)  Medications Ordered in ED Medications  sodium chloride flush (NS) 0.9 % injection 3 mL (has no administration in time range)  0.9 %  sodium chloride infusion (has no administration in time range)     Initial Impression / Assessment and Plan / ED Course  I have reviewed the triage vital signs and the nursing notes.  Pertinent labs & imaging results that were available during my care of the patient were reviewed by me and considered in my medical decision making (see chart for details).       73 year old male presents today with new onset of dyspnea, fever.  Chest x-Velmer Woelfel clear.  Patient with positive point-of-care Covid test.  EKG with A. fib RVR.  Fluids running but no bolus given at this time.  Rate has been about 110.  Appears improved when he is sitting and nonexertional. Oxygen saturations on 4 L at 94%.  They drop significantly with any exertion. Discussed with Erin Hearing on call for hospitalist. She will see for admission Rodney Burns was evaluated in Emergency Department on 12/24/2018 for the symptoms described in the history of present illness. He was evaluated in the context of the global COVID-19 pandemic, which necessitated consideration that the patient might be at risk for infection with the SARS-CoV-2 virus that causes COVID-19. Institutional protocols and algorithms that pertain to the evaluation of patients at risk for COVID-19 are in a state of rapid change based on information released by regulatory bodies including the CDC and federal and state organizations. These policies and algorithms were followed during the patient's care in the ED.  Final Clinical Impressions(s) / ED  Diagnoses   Final diagnoses:  COVID-19  Hypoxia  Atrial fibrillation, unspecified type ([place])  CLL (chronic lymphocytic leukemia) [facility])    ED Discharge Orders    None       Pattricia Boss, MD 12/24/18 1226

## 2018-12-24 NOTE — H&P (Addendum)
History and Physical    Rodney Burns [id number] DOB: 06-03-1945 DOA: 12/24/2018  **Will admit patient based on the expectation that the patient will need hospitalization/ hospital care that crosses at least 2 midnights  PCP: Hulan Fess, MD   Attending physician: Wendi Maya. Tamala Julian, MD  Patient coming from/Resides with: Private events with wife  Chief Complaint: Shortness of breath with low oxygen based on reading from home pulse oximeter  HPI: Rodney Burns is a 73 y.o. male with medical history significant for chronic atrial fibrillation on beta-blocker and Xarelto, tree of RFA x2 in 2013, history of CAD status post CABG in 1994, ischemic cardiomyopathy with EF 40 to 45% with stable regional wall motion abnormalities and grade 1 diastolic dysfunction based on echo 2019, hypertension, dyslipidemia, CLL with chronic thrombocytopenia with baseline platelets in the 130s.  Patient presented to the ER with complaints of URI type symptoms for about 10 days worse over the past 4 days.  Initially started with productive cough of green sputum that was very sick which has resolved.  Initially patient had issues with everything tasting salty but that has resolved.  He has not had any chest pain although he does report worsening of baseline sciatica on the right side.  Denies overt myalgias.  Denies nausea, vomiting or diarrhea.  He was unaware of any apparent fevers at home.  Patient reported that was unable to sleep supine for the past several days and last night slept in a recliner.  This morning he noticed that his O2 sats were 80% on room air at home noting he does not wear oxygen at home.  In triage oximetry remained low at 89% on 5 L.  He was found to be in A. fib with RVR with ventricular rates in the 110s to 120s.  He had significant increased work of breathing in triage and therefore was brought straight to the treatment room.  Initial temperature was 98.6 with a respiratory rate of 26, pulse of 130,  BP 144/81 with O2 sats 89% on 5 L as described.  Subsequently patient has spiked a fever up to 101.2 F, he remains tachycardic, O2 sats are 94% on 4 L.  He still has increased work of breathing with any movement including movement on the stretcher but is able to speak without any shortness of breath or increased work of breathing.  Subsequently SARS-CoV-2 PCR has returned back positive.  Chest x-ray was read as no acute cardiopulmonary findings but there is some haziness in left base which correlates with crackles noted on clinical exam.  Most recent SBP in the 90s with heart rates in the 120s.  Inflammatory markers elevated with LDH 564, ferritin 510, CRP 7.3, AST 363 and ALT 507.  Patient has been given Tylenol and IV fluids in the ER and after discussion with the EDP plans are to administer initial dose of Decadron while in the ER.  Because patient is Covid positive with O2 requirement 4 L or greater plan is to admit to [facility] for further specialized Covid treatment.  ED Course:  Vital Signs: BP (!) 144/81 (BP Location: Left Arm)   Pulse (!) 120   Temp (S) (!) 101.2 F (38.4 C) (Oral)   Resp (!) 30   Ht 5\' 10"  (1.778 m)   Wt 95.3 kg   SpO2 94%   BMI 30.13 kg/m   Review of Systems:  In addition to the HPI above,  No objective or subjective fever-chills, possible focal myalgias  involving right side or other constitutional symptoms No Headache, changes with Vision or hearing, new weakness, tingling, numbness in any extremity,+ dizziness with ambulation or movement, no dysarthria or word finding difficulty, gait disturbance or imbalance, tremors or seizure activity No problems swallowing food or Liquids, indigestion/reflux, choking or coughing while eating, abdominal pain with or after eating No Chest pain, + productive cough green sputum first few days of illness which has resolved, + progressive shortness of Breath, palpitations, orthopnea and DOE for 4 days No Abdominal pain, N/V,  melena,hematochezia, dark tarry stools, constipation No dysuria, malodorous urine, hematuria or flank pain No new skin rashes, lesions, masses or bruises, No new joint pains, aches, swelling or redness No recent unintentional weight gain or loss No polyuria, polydypsia or polyphagia   Past Medical History:  Diagnosis Date  . Atrial tachycardia ([place])    a. s/p DCCV 09/2009; b. s/p RFCA 03/2011; c. s/p DCCV 04/2011; d. s/p RFCA 09/17/11.  . Cataract   . Chronic systolic CHF (congestive heart failure) ([place])    a. 01/2015 Echo: EF 40-45%, antsept/infsept HK, triv AI, mild MR, mod dil LA, nl RV, mildly dil RA.  [name] CLL (chronic lymphoblastic leukemia)    Stage 0-1  . Coronary artery disease involving native coronary artery without angina pectoris    a. Status post CABG in 1994:By Dr. Cyndia Bent. LIMA - L Cx, seqSVG-Diag-LAD, and SVG-PDA-PL.  [name] Cough    Consistant with ACE inhibitor mediated cough  . Glaucoma (increased eye pressure) 1991  . History of tobacco abuse    quit 1994  . Hypercholesterolemia    Well Controlled  . Hypertension   . Ischemic cardiomyopathy    a. 02/2011 Echo: EF 40-45%;  b. 01/2015 Echo: EF 40-45%.  . Malaria 1972   Hx of  . Pericarditis    a. 01/2015-->Treated w/ ibuprofen/colchicine.  . Sleep-disordered breathing 06/20/2011    Past Surgical History:  Procedure Laterality Date  . ATRIAL FLUTTER ABLATION N/A 04/11/2011   Procedure: ATRIAL FLUTTER ABLATION;  Surgeon: Deboraha Sprang, MD;  Location[facility] CATH LAB;  Service: Cardiovascular;  Laterality: N/A;  . ATRIAL FLUTTER ABLATION N/A 09/17/2011   Procedure: ATRIAL FLUTTER ABLATION;  Surgeon: Thompson Grayer, MD;  Location: [facility] CATH LAB;  Service: Cardiovascular;  Laterality: N/A;  . CARDIOVERSION  06/03/2011   Procedure: CARDIOVERSION;  Surgeon: Deboraha Sprang, MD;  Location: [facility];  Service: Cardiovascular;  Laterality: N/A;  . CATARACT EXTRACTION    . CORONARY ARTERY BYPASS GRAFT  1994   By Dr. Cyndia Bent. LIMA - L Cx,  seqSVG-Diag-LAD, and SVG-PDA-PL  . ELECTROPHYSIOLOGY STUDY N/A 04/11/2011   Procedure: ELECTROPHYSIOLOGY STUDY;  Surgeon: Deboraha Sprang, MD;  Location: [facility] CATH LAB;  Service: Cardiovascular;  Laterality: N/A;  . EP study and ablation  2013   by Dr Caryl Comes, repeat ablation by Dr Rayann Heman  . NM MYOVIEW LTD  03/2014   scar in the inferior and anteroapical regions without ischemia. This is similar to finding on Myoview in 2013. EF is a little lower 39%>>32%.  [name] TRIGGER FINGER RELEASE  12/2016   left hand   . US ECHOCARDIOGRAPHY  09-07-09; 02/2011   a. Est EF 40-45%; b. EF 40-45%. Gr 2 DD. Mild LA dil    Social History   Socioeconomic History  . Marital status: Married    Spouse name: Not on file  . Number of children: 2  . Years of education: Not on file  . Highest education level: Not on  file  Occupational History  . Occupation: Careers adviser: RETIRED  Social Needs  . Financial resource strain: Not on file  . Food insecurity    Worry: Not on file    Inability: Not on file  . Transportation needs    Medical: Not on file    Non-medical: Not on file  Tobacco Use  . Smoking status: Former Smoker    Packs/day: 2.00    Years: 30.00    Pack years: 60.00    Types: Cigarettes    Quit date: 01/22/1992    Years since quitting: 26.9  . Smokeless tobacco: Never Used  Substance and Sexual Activity  . Alcohol use: No  . Drug use: No  . Sexual activity: Yes  Lifestyle  . Physical activity    Days per week: Not on file    Minutes per session: Not on file  . Stress: Not on file  Relationships  . Social Herbalist on phone: Not on file    Gets together: Not on file    Attends religious service: Not on file    Active member of club or organization: Not on file    Attends meetings of clubs or organizations: Not on file    Relationship status: Not on file  . Intimate partner violence    Fear of current or ex partner: Not on file    Emotionally abused: Not on file     Physically abused: Not on file    Forced sexual activity: Not on file  Other Topics Concern  . Not on file  Social History Narrative  . Not on file    Mobility: Independent Work history: Retired   Allergies  Allergen Reactions  . Penicillins Other (See Comments)    Put pt in hospital 50 yrs ago  . Ace Inhibitors Cough  . Darvocet [Propoxyphene N-Acetaminophen] Rash    Tolerates tylenol  . Sulfa Antibiotics Rash    Family History  Problem Relation Age of Onset  . Heart failure Father   . Cancer Brother        colon  . Down syndrome Son    Family history reviewed and not pertinent to current presenting symptoms  Prior to Admission medications   Medication Sig Start Date End Date Taking? Authorizing Provider  Coenzyme Q10 (COQ-10) 200 MG CAPS Take 200 mg by mouth daily.    [provider]  Docusate Calcium (STOOL SOFTENER PO) Take by mouth 2 (two) times daily.    [provider]  ENTRESTO 49-51 MG TAKE 1 TABLET TWICE A DAY 10/06/18   Martinique, [name], MD  ezetimibe-simvastatin (VYTORIN) 10-20 MG tablet TAKE 1 TABLET AT BEDTIME 12/22/18   [place], [name], MD  Fiber, Guar Gum, CHEW Chew 1 capsule by mouth daily.    [provider]  latanoprost (XALATAN) 0.005 % ophthalmic solution Place 1 drop into both eyes at bedtime.     [provider]  Magnesium 250 MG TABS Take 500 mg by mouth at bedtime.    [provider]  pramipexole (MIRAPEX) 0.5 MG tablet Take 1.5 mg by mouth daily.     [provider]  pramipexole (MIRAPEX) 1.5 MG tablet  11/24/17   [provider]  RABEprazole (ACIPHEX) 20 MG tablet Take 1 tablet (20 mg total) by mouth daily. 01/28/12   Martinique, [name], MD  TOPROL XL 25 MG 24 hr tablet TAKE 1 TABLET DAILY ([place] WITH  DOCTOR Martinique FOR FUTURE REFILLS [phone]. FIRST ATTEMPT) 12/14/18   Martinique, [name], MD  traMADol (ULTRAM) 50 MG tablet Take 1 tablet (50 mg total) by mouth every 6  (six) hours as needed for moderate pain. 12/14/18   Nicholas Lose, MD  XARELTO 20 MG TABS tablet TAKE 1 TABLET DAILY WITH SUPPER 08/04/18   Martin[place], [name], MD    Physical Exam: Vitals:   12/24/18 1036 12/24/18 1129 12/24/18 1130  BP: (!) 144/81    Pulse: (!) 130 (!) 120   Resp:  (!) 30   Temp: 98.6 F (37 C) (!) 101.2 F (38.4 C)   TempSrc:  (S) Oral   SpO2: (!) 89% 94%   Weight:   95.3 kg  Height:   5\' 10"  (1.778 m)      Constitutional: NAD, calm, comfortable on oxygen Eyes: PERRL, lids and conjunctivae normal ENMT: Mucous membranes are dry. Posterior pharynx clear of any exudate or lesions.Normal dentition.  Neck: normal, supple, no masses, no thyromegaly Respiratory: Bilateral lung sounds diminished posteriorly especially in upper airways, crackles in left base, 4 L oxygen with O2 sats 94 to 96%, some persistent tachypnea with respiratory rates in the high 20s and notable increased work of breathing with any activity although patient was able to speak in complete sentences with oxygen in place Cardiovascular: Irregular rate with underlying atrial fibrillation rhythm with ventricular rates in the 120s, no murmurs / rubs / gallops. No extremity edema. 2+ pedal pulses. No carotid bruits.  No JVD, hands are warm to touch, feet are cool to touch with decreased capillary refill, SBP in the 90s Abdomen: no tenderness, no masses palpated. No hepatosplenomegaly. Bowel sounds positive.  Musculoskeletal: no clubbing / cyanosis. No joint deformity upper and lower extremities. Good ROM, no contractures. Normal muscle tone.  Skin: no rashes, lesions, ulcers. No induration Neurologic: CN 2-12 grossly intact. Sensation intact, DTR normal. Strength 5/5 x all 4 extremities.  Psychiatric: Normal judgment and insight. Alert and oriented x 3. Normal mood.    Labs on Admission: I have personally reviewed following labs and imaging studies  CBC: Recent Labs  Lab 12/24/18 1053 12/24/18 1134  WBC  23.2* 19.7*  NEUTROABS  --  PENDING  HGB 15.3 15.6  HCT 49.0 49.2  MCV 84.3 83.1  PLT 215 [id number]   Basic Metabolic Panel: Recent Labs  Lab 12/24/18 1053 12/24/18 1134  NA 135 134*  K 4.5 4.5  CL 101 98  CO2 19* 23  GLUCOSE 107* 110*  BUN 19 19  CREATININE 0.96 1.00  CALCIUM 8.7* 8.7*   GFR: Estimated Creatinine Clearance: 75.1 mL/min (by C-G formula based on SCr of 1 mg/dL). Liver Function Tests: Recent Labs  Lab 12/24/18 1134  AST 363*  ALT 507*  ALKPHOS 56  BILITOT 2.0*  PROT 6.7  ALBUMIN 3.3*   No results for input(s): LIPASE, AMYLASE in the last 168 hours. No results for input(s): AMMONIA in the last 168 hours. Coagulation Profile: No results for input(s): INR, PROTIME in the last 168 hours. Cardiac Enzymes: No results for input(s): CKTOTAL, CKMB, CKMBINDEX, TROPONINI in the last 168 hours. BNP (last 3 results) No results for input(s): PROBNP in the last 8760 hours. HbA1C: No results for input(s): HGBA1C in the last 72 hours. CBG: No results for input(s): GLUCAP in the last 168 hours. Lipid Profile: Recent Labs    12/24/18 1134  TRIG 93   Thyroid Function Tests: No results for input(s): TSH, T4TOTAL, FREET4, T3FREE, THYROIDAB  in the last 72 hours. Anemia Panel: Recent Labs    12/24/18 1134  FERRITIN 510*   Urine analysis:    Component Value Date/Time   LABSPEC 1.010 08/24/2015 1201   PHURINE 7.0 08/24/2015 1201   GLUCOSEU Negative 08/24/2015 1201   HGBUR Negative 08/24/2015 1201   BILIRUBINUR Negative 08/24/2015 1201   KETONESUR Negative 08/24/2015 1201   PROTEINUR < 30 08/24/2015 1201   UROBILINOGEN 0.2 08/24/2015 1201   NITRITE Negative 08/24/2015 1201   LEUKOCYTESUR Negative 08/24/2015 1201   Sepsis Labs: @LABRCNTIP (procalcitonin:4,lacticidven:4) )No results found for this or any previous visit (from the past 240 hour(s)).   Radiological Exams on Admission: Dg Chest Portable 1 View  Result Date: 12/24/2018 CLINICAL DATA:  Cough,  shortness of breath EXAM: PORTABLE CHEST 1 VIEW COMPARISON:  03/20/2017 FINDINGS: Post CABG changes. Stable mild cardiomegaly. No focal airspace consolidation. No pleural effusion or pneumothorax. IMPRESSION: No acute cardiopulmonary findings. Electronically Signed   By: Davina Poke M.D.   On: 12/24/2018 11:13    EKG: (Independently reviewed) fibrillation with ventricular rate 124 bpm, underlying right axis deviation, somewhat delayed R wave rotation, no definitive acute ischemic changes, QTC 459 ms  Assessment/Plan Principal Problem: Sepsis with pneumonia due to COVID-19 virus -Presents as above with acute hypoxemia and confirmed SARS-CoV-2 status -Sepsis physiology as follows: Fever greater than 100.5 F, tachycardia, tachypnea, source of infection, hemodynamic instability, transaminitis -Reported thick green sputum and does have questionable early pneumonia left lower lobe.  Patient has penicillin allergy will utilize IV Levaquin 750 mg daily -Obtain sputum and blood cultures -Provide supportive care: Oxygen, incentive spirometry, Combivent HFA -Due to significant transaminitis unable to initiate remdesivir-pharmacy assisting and monitoring and will assist with dosing once eligible -Decadron 6 mg IV daily -Normal saline IV fluid at 100 cc/h-patient without GI symptoms so was able to tolerate liquids can decrease IV fluid to 50 or 75 cc/h -Appropriate airborne and contact isolation -[facility] when [facility] available -D-dimer elevated at 2.12 with a fibrinogen of 744-patient on full dose Xarelto prior to admission which will be continued  Active Problems:   Atrial fibrillation with RVR ([place]) -Patient presented with tachycardia in the absence of fever and has had persistent tachycardia since arrival although this is also associated with fever -On low-dose Toprol at home-since arrival SBP has decreased to 90 therefore will give one-time dose of IV Lopressor-patient has received Tylenol  for fever-unable to use NSAIDs due to underlying Covid diagnosis -Suspect lower blood pressure reading more related to RVR therefore will focus on rate control-require Cardizem infusion-if this is initiated will need to change bed request from telemetry to stepdown -Continue preadmission Xarelto    Acute respiratory failure with hypoxia ([place]) -See above regarding treating Covid pneumonia -Not on oxygen at home -Wean oxygen as tolerated     Hypertension -Current blood pressure suboptimal so we will hold Toprol in favor of shorter acting IV beta-blockers and/or calcium channel blockers as described above    Ischemic cardiomyopathy/ Chronic systolic CHF (congestive heart failure) ([facility]) -Currently asymptomatic -Careful volume administration -Was not on Lasix prior to admission -Beta-blocker as above, hold Entresto -Last echo in 2019 revealed an EF of 40 to 45% with stable regional wall motion abnormalities and grade 1 diastolic dysfunction    Coronary artery disease involving native coronary artery without angina pectoris -Asymptomatic -Continue to follow -Beta-blocker as above -Continue Xarelto-not on aspirin at home    Hypercholesterolemia -Hold Vytorin given Covid related transaminitis -Follow labs    **  Additional lab, imaging and/or diagnostic evaluation at discretion of supervising physician  DVT prophylaxis: Xarelto Code Status: Full Family Communication: Patient only Disposition Plan: Inpatient with plans to admit to [facility] for acute Covid pneumonia with hypoxemic respiratory failure and elevated inflammatory markers-in addition oxygen patient requires IV fluids, IV steroids, frequent labs, eventual initiation of remdesivir once LFTs improve, and IV antibiotics for secondary bacterial pneumonia Consults called: None    Erin Hearing ANP-BC [facility] Pager [phone]   If 7PM-7AM, please contact night-coverage [email or web address] Password [email or web address]  12/24/2018, 1:09 PM

## 2018-12-24 NOTE — ED Notes (Signed)
Called ptar for pt transport to [facility]

## 2018-12-24 NOTE — ED Triage Notes (Addendum)
C/o SOB x 1 week.  Pt bought a portable pulse ox and did a virtual visit because sats in the 80s at home.  Denies cough/fever.  89% on 5 liters O2.  Afib with RVR.  Breathing labored.  Pt straight to treatment room.

## 2018-12-24 NOTE — Progress Notes (Signed)
Patient has been transferred to [facility] for management of his acute hypoxic respiratory failure due to SARS COVID-19 viral pneumonia.  His oximetry is 95% on 4 L per nasal cannula.  His chest radiograph has faint interstitial infiltrates at bases bilaterally.  His LFTs are elevated, AST 363, ALT 507.  Patient is receiving methylprednisolone, patient continued to be at high risk of worsening hypoxic respiratory failure, will proceed with convalescent plasma transfusion.  I have explained Rodney Burns about the risk and benefits of convalescent plasma transfusion (including potential transfusion related reaction and complications).  He agrees to proceed with intervention.

## 2018-12-24 NOTE — Progress Notes (Addendum)
CRITICAL VALUE ALERT  Critical Value:  Lactic Acid 2.6  Date & Time Notied:  12/24/18 19:33  Provider Notified: Opyd  Orders Received/Actions taken: IVF ordered

## 2018-12-24 NOTE — Progress Notes (Signed)
Notified spouse of progress.  All questions were answered and this nurse's contact number shared for further communication.   

## 2018-12-24 NOTE — ED Notes (Signed)
Pt's spouse updated on plan of care.  

## 2018-12-25 ENCOUNTER — Inpatient Hospital Stay (HOSPITAL_COMMUNITY): Payer: Medicare Other

## 2018-12-25 DIAGNOSIS — J9601 Acute respiratory failure with hypoxia: Secondary | ICD-10-CM

## 2018-12-25 DIAGNOSIS — I5022 Chronic systolic (congestive) heart failure: Secondary | ICD-10-CM

## 2018-12-25 DIAGNOSIS — I4891 Unspecified atrial fibrillation: Secondary | ICD-10-CM

## 2018-12-25 LAB — COMPREHENSIVE METABOLIC PANEL
ALT: 405 U/L — ABNORMAL HIGH (ref 0–44)
AST: 186 U/L — ABNORMAL HIGH (ref 15–41)
Albumin: 2.8 g/dL — ABNORMAL LOW (ref 3.5–5.0)
Alkaline Phosphatase: 47 U/L (ref 38–126)
Anion gap: 10 (ref 5–15)
BUN: 22 mg/dL (ref 8–23)
CO2: 22 mmol/L (ref 22–32)
Calcium: 8.7 mg/dL — ABNORMAL LOW (ref 8.9–10.3)
Chloride: 102 mmol/L (ref 98–111)
Creatinine, Ser: 0.83 mg/dL (ref 0.61–1.24)
GFR calc Af Amer: >60 mL/min (ref >60)
GFR calc non Af Amer: >60 mL/min (ref >60)
Glucose, Bld: 220 mg/dL — ABNORMAL HIGH (ref 70–99)
Potassium: 4.2 mmol/L (ref 3.5–5.1)
Sodium: 134 mmol/L — ABNORMAL LOW (ref 135–145)
Total Bilirubin: 1 mg/dL (ref 0.3–1.2)
Total Protein: 6 g/dL — ABNORMAL LOW (ref 6.5–8.1)

## 2018-12-25 LAB — MAGNESIUM: Magnesium: 2.1 mg/dL (ref 1.7–2.4)

## 2018-12-25 LAB — D-DIMER, QUANTITATIVE: D-Dimer, Quant: 1.84 ug/mL-FEU — ABNORMAL HIGH (ref 0.00–0.50)

## 2018-12-25 LAB — BPAM FFP
Blood Product Expiration Date: 202012042100
ISSUE DATE / TIME: 202012032201
Unit Type and Rh: 6200

## 2018-12-25 LAB — CBC WITH DIFFERENTIAL/PLATELET
Abs Immature Granulocytes: 0.05 10*3/uL (ref 0.00–0.07)
Basophils Absolute: 0 10*3/uL (ref 0.0–0.1)
Basophils Relative: 0 %
Eosinophils Absolute: 0 10*3/uL (ref 0.0–0.5)
Eosinophils Relative: 0 %
HCT: 44.1 % (ref 39.0–52.0)
Hemoglobin: 13.8 g/dL (ref 13.0–17.0)
Immature Granulocytes: 0 %
Lymphocytes Relative: 63 %
Lymphs Abs: 10.4 10*3/uL — ABNORMAL HIGH (ref 0.7–4.0)
MCH: 26.2 pg (ref 26.0–34.0)
MCHC: 31.3 g/dL (ref 30.0–36.0)
MCV: 83.7 fL (ref 80.0–100.0)
Monocytes Absolute: 0.2 10*3/uL (ref 0.1–1.0)
Monocytes Relative: 1 %
Neutro Abs: 6 10*3/uL (ref 1.7–7.7)
Neutrophils Relative %: 36 %
Platelets: 189 10*3/uL (ref 150–400)
RBC: 5.27 MIL/uL (ref 4.22–5.81)
RDW: 14.4 % (ref 11.5–15.5)
WBC: 16.8 10*3/uL — ABNORMAL HIGH (ref 4.0–10.5)
nRBC: 0 % (ref 0.0–0.2)

## 2018-12-25 LAB — PREPARE FRESH FROZEN PLASMA: Unit division: 0

## 2018-12-25 LAB — GLUCOSE, CAPILLARY
Glucose-Capillary: 158 mg/dL — ABNORMAL HIGH (ref 70–99)
Glucose-Capillary: 161 mg/dL — ABNORMAL HIGH (ref 70–99)
Glucose-Capillary: 203 mg/dL — ABNORMAL HIGH (ref 70–99)
Glucose-Capillary: 140 mg/dL — ABNORMAL HIGH (ref 70–99)

## 2018-12-25 LAB — ABO/RH: ABO/RH(D): A POS

## 2018-12-25 LAB — FERRITIN: Ferritin: 367 ng/mL — ABNORMAL HIGH (ref 24–336)

## 2018-12-25 LAB — C-REACTIVE PROTEIN: CRP: 7.6 mg/dL — ABNORMAL HIGH (ref <1.0)

## 2018-12-25 LAB — PHOSPHORUS: Phosphorus: 2.5 mg/dL (ref 2.5–4.6)

## 2018-12-25 LAB — LACTIC ACID, PLASMA: Lactic Acid, Venous: 2.8 mmol/L (ref 0.5–1.9)

## 2018-12-25 LAB — PATHOLOGIST SMEAR REVIEW

## 2018-12-25 MED ORDER — VITAMIN B-1 100 MG PO TABS
100.0000 mg | ORAL_TABLET | Freq: Every day | ORAL | Status: DC
  Administered 2018-12-26 – 2018-12-30 (×5): 100 mg via ORAL
  Filled 2018-12-25 (×5): qty 1

## 2018-12-25 MED ORDER — FUROSEMIDE 10 MG/ML IJ SOLN
40.0000 mg | Freq: Every day | INTRAMUSCULAR | Status: DC
  Administered 2018-12-25 – 2018-12-27 (×3): 40 mg via INTRAVENOUS
  Filled 2018-12-25 (×3): qty 4

## 2018-12-25 MED ORDER — FOLIC ACID 1 MG PO TABS
1.0000 mg | ORAL_TABLET | Freq: Every day | ORAL | Status: DC
  Administered 2018-12-26 – 2018-12-30 (×5): 1 mg via ORAL
  Filled 2018-12-25 (×5): qty 1

## 2018-12-25 MED ORDER — SODIUM CHLORIDE 0.9 % IV BOLUS
250.0000 mL | Freq: Once | INTRAVENOUS | Status: AC
  Administered 2018-12-25: 250 mL via INTRAVENOUS

## 2018-12-25 NOTE — Progress Notes (Addendum)
CRITICAL VALUE ALERT  Critical Value:  Lactic Acid 2.8  Date & Time Notied:  12/25/18 03:34  Provider Notified: Opyd  Orders Received/Actions taken: IVF ordered

## 2018-12-25 NOTE — Progress Notes (Signed)
PROGRESS NOTE                                                                                                                                                                                                             Patient Demographics:    Rodney Burns, is a 73 y.o. male, DOB - 1945/04/10, PB:[id number]  Outpatient Primary MD for the patient is Hulan Fess, MD   Admit date - 12/24/2018   LOS - 1  Chief Complaint  Patient presents with  . Covid/SOB  . r/o covid       Brief Narrative: Patient is a 73 y.o. male with PMHx of chronic atrial fibrillation-status post ablation-on Xarelto, history of CAD-s/p CABG [id number], chronic systolic heart failure, HTN, HLD-scented with 3-4-day history of worsening shortness of breath-he had been feeling unwell with fatigue and malaise for approximately 10 days.  In the ED, he was found to have acute hypoxic respiratory failure requiring around 3-4 L of oxygen, he was also found to have significant transaminitis.  He was transferred to [facility] for further evaluation and treatment.   Subjective:    Rodney Burns today feels better-he was anywhere between 3 to 4 L of oxygen while I was in the room.  He was not in any distress and was easily speaking in full sentences.   Assessment  & Plan :   Acute Hypoxic Resp Failure due to Covid 19 Viral pneumonia: Feels better-still with 3-4 L of oxygen requirement.  Continue steroids-patient is s/p convalescent plasma overnight.  Given significant transaminitis-remdesivir and Actemra not given-thankfully oxygen requirement has stabilized-and in fact starting to trend down.     Fever: afebrile  O2 requirements:  SpO2: 90 % O2 Flow Rate (L/min): 4 L/min   COVID-19 Labs: Recent Labs    12/24/18 1134 12/24/18 1305 12/25/18 0000  DDIMER 2.12*  --  1.84*  FERRITIN 510* 490* 367*  LDH 564*  --   --   CRP 7.3*  --  7.6*       Component Value Date/Time   BNP 249.1 (H) 10/16/2017 1435   BNP 175.5 (H) 02/15/2015 1731    Recent Labs  Lab 12/24/18 1134 12/24/18 1305  PROCALCITON <0.10 <0.10    No results found for: SARSCOV2NAA   COVID-19 Medications: Steroids:12/3>> Remdesivir: Not given due to ALT> 250 Actemra: Will avoid  given significant ALT/AST elevation Convalescent Plasma: x 1 on 12/3  Other medications: Diuretics:Euvolemic-Lasix 40 mg IV daily-to maintain negative balance.  Follow weights/electrolytes. Antibiotics:Not needed as no evidence of bacterial infection-levofloxacin discontinued 12/3 CBG's stable on SSI while on steroid   Prone/Incentive Spirometry: encouraged patient to lie prone for 3-4 hours at a time for a total of 16 hours a day, and to encourage incentive spirometry use 3-4/hour.  DVT Prophylaxis  : Xarelto  Chronic atrial fibrillation: Rate controlled-continue metoprolol-anticoagulated with Xarelto  Chronic systolic heart failure (LVEF 40-45% by TTE on 10/21/2017): Euvolemic on exam-maintain on IV Lasix to keep in negative balance.  Follow weights/electrolytes.  CAD: No anginal symptoms  HLD: Due to significant transaminitis.  GERD: Continue PPI  Obesity: Estimated body mass index is 30.13 kg/m as calculated from the following:   Height as of this encounter: 5\' 10"  (1.778 m).   Weight as of this encounter: 95.3 kg.   Consults  :  None  Procedures  :  None  ABG: No results found for: PHART, PCO2ART, PO2ART, HCO3, TCO2, ACIDBASEDEF, O2SAT  Vent Settings: N/A  Condition -Stable  Family Communication  :  Spouse/Daughter updated over the phone  Code Status :  Full Code  Diet :  Diet Order            Diet Heart Room service appropriate? Yes; Fluid consistency: Thin  Diet effective now               Disposition Plan  :  Remain hospitalized  Barriers to discharge: Hypoxia requiring O2 supplementation  Antimicorbials  :    Anti-infectives (From admission, onward)   Start      Dose/Rate Route Frequency Ordered Stop   12/24/18 1400  levofloxacin (LEVAQUIN) IVPB 750 mg  Status:  Discontinued     750 mg 100 mL/hr over 90 Minutes Intravenous Every 24 hours 12/24/18 1305 12/24/18 1712      Inpatient Medications  Scheduled Meds: . simvastatin  20 mg Oral QHS   And  . ezetimibe  10 mg Oral QHS  . [START ON 0000000 folic acid  1 mg Oral Daily  . insulin aspart  0-9 Units Subcutaneous TID WC  . Ipratropium-Albuterol  1 puff Inhalation Q6H  . latanoprost  1 drop Both Eyes QHS  . magnesium oxide  400 mg Oral QHS  . methylPREDNISolone (SOLU-MEDROL) injection  40 mg Intravenous Q12H  . metoprolol succinate  25 mg Oral Daily  . pantoprazole  40 mg Oral Daily  . pramipexole  1.5 mg Oral Daily  . rivaroxaban  20 mg Oral Q supper  . sodium chloride flush  3 mL Intravenous Once  . sodium chloride flush  3 mL Intravenous Q12H  . [START ON 12/26/2018] thiamine  100 mg Oral Daily   Continuous Infusions: PRN Meds:.acetaminophen, guaiFENesin-dextromethorphan, ondansetron **OR** ondansetron (ZOFRAN) IV, traMADol   Time Spent in minutes  35    See all Orders from today for further details   Oren Binet M.D on 12/25/2018 at 1:30 PM  To page go to [email or web address] - use universal password  [facility] -  Office  [phone]    Objective:   Vitals:   12/25/18 0639 12/25/18 0640 12/25/18 0740 12/25/18 1126  BP:   117/73 (!) 111/56  Pulse:   96 94  Resp: (!) 24  20 20   Temp:   (!) 97.4 F (36.3 C) 97.9 F (36.6 C)  TempSrc:   Oral Oral  SpO2:  94% 92% 90%  Weight:      Height:        Wt Readings from Last 3 Encounters:  12/24/18 95.3 kg  08/27/18 97.6 kg  01/27/18 100.8 kg     Intake/Output Summary (Last 24 hours) at 12/25/2018 1330 Last data filed at 12/25/2018 0800 Gross per 24 hour  Intake 2054.7 ml  Output 2200 ml  Net -145.3 ml     Physical Exam Gen Exam:Alert awake-not in any distress HEENT:atraumatic, normocephalic Chest: B/L  clear to auscultation anteriorly CVS:S1S2 regular Abdomen:soft non tender, non distended Extremities:no edema Neurology: Non focal Skin: no rash   Data Review:    CBC Recent Labs  Lab 12/24/18 1053 12/24/18 1134 12/25/18 0000  WBC 23.2* 19.7* 16.8*  HGB 15.3 15.6 13.8  HCT 49.0 49.2 44.1  PLT 215 227 189  MCV 84.3 83.1 83.7  MCH 26.3 26.4 26.2  MCHC 31.2 31.7 31.3  RDW 14.4 14.2 14.4  LYMPHSABS  --  12.2* 10.4*  MONOABS  --  0.5 0.2  EOSABS  --  0.0 0.0  BASOSABS  --  0.0 0.0    Chemistries  Recent Labs  Lab 12/24/18 1053 12/24/18 1134 12/25/18 0000  NA 135 134* 134*  K 4.5 4.5 4.2  CL 101 98 102  CO2 19* 23 22  GLUCOSE 107* 110* 220*  BUN 19 19 22   CREATININE 0.96 1.00 0.83  CALCIUM 8.7* 8.7* 8.7*  MG  --   --  2.1  AST  --  363* 186*  ALT  --  507* 405*  ALKPHOS  --  56 47  BILITOT  --  2.0* 1.0   ------------------------------------------------------------------------------------------------------------------ Recent Labs    12/24/18 1134  TRIG 93    Lab Results  Component Value Date   HGBA1C 6.7 (H) 12/24/2018   ------------------------------------------------------------------------------------------------------------------ No results for input(s): TSH, T4TOTAL, T3FREE, THYROIDAB in the last 72 hours.  Invalid input(s): FREET3 ------------------------------------------------------------------------------------------------------------------ Recent Labs    12/24/18 1305 12/25/18 0000  FERRITIN 490* 367*    Coagulation profile No results for input(s): INR, PROTIME in the last 168 hours.  Recent Labs    12/24/18 1134 12/25/18 0000  DDIMER 2.12* 1.84*    Cardiac Enzymes No results for input(s): CKMB, TROPONINI, MYOGLOBIN in the last 168 hours.  Invalid input(s): CK ------------------------------------------------------------------------------------------------------------------    Component Value Date/Time   BNP 249.1 (H)  10/16/2017 1435   BNP 175.5 (H) 02/15/2015 1731    Micro Results Recent Results (from the past 240 hour(s))  Blood Culture (routine x 2)     Status: None (Preliminary result)   Collection Time: 12/24/18 11:35 AM   Specimen: BLOOD  Result Value Ref Range Status   Specimen Description BLOOD RIGHT ANTECUBITAL  Final   Special Requests   Final    BOTTLES DRAWN AEROBIC AND ANAEROBIC Blood Culture adequate volume   Culture   Final    NO GROWTH < 24 HOURS Performed at [facility], [place] [address] [place], [place] [address]    Report Status PENDING  Incomplete  Blood Culture (routine x 2)     Status: None (Preliminary result)   Collection Time: 12/24/18 12:24 PM   Specimen: BLOOD  Result Value Ref Range Status   Specimen Description BLOOD LEFT ANTECUBITAL  Final   Special Requests   Final    BOTTLES DRAWN AEROBIC AND ANAEROBIC Blood Culture results may not be optimal due to an inadequate volume of blood received in culture bottles   Culture   Final  NO GROWTH < 24 HOURS Performed at [facility] [address] [place], [place] [address]    Report Status PENDING  Incomplete    Radiology Reports Dg Chest Port Am  Result Date: 12/25/2018 CLINICAL DATA:  Shortness of breath. EXAM: PORTABLE CHEST 1 VIEW COMPARISON:  December 24, 2018. FINDINGS: The heart size and mediastinal contours are within normal limits. No pneumothorax or pleural effusion is noted. Minimally increased diffuse interstitial densities are noted throughout both lungs concerning for atypical inflammation or infection. Status post coronary bypass graft. The visualized skeletal structures are unremarkable. IMPRESSION: Minimally increased diffuse interstitial densities are noted throughout both lungs concerning for atypical inflammation or infection. Followup radiographs are recommended until resolution. Electronically Signed   By: Marijo Conception M.D.   On: 12/25/2018 08:10   Dg Chest Portable 1 View  Result  Date: 12/24/2018 CLINICAL DATA:  Cough, shortness of breath EXAM: PORTABLE CHEST 1 VIEW COMPARISON:  03/20/2017 FINDINGS: Post CABG changes. Stable mild cardiomegaly. No focal airspace consolidation. No pleural effusion or pneumothorax. IMPRESSION: No acute cardiopulmonary findings. Electronically Signed   By: Davina Poke M.D.   On: 12/24/2018 11:13

## 2018-12-25 NOTE — Progress Notes (Signed)
Completed one unit of FFP.

## 2018-12-25 NOTE — Discharge Instructions (Addendum)
Follow with Primary MD Hulan Fess, MD and your Cardiologist in 14 days   Get CBC, CMP, 2 view Chest X ray -  checked next visit within 2 weeks by Primary MD    Activity: As tolerated with Full fall precautions use walker/cane & assistance as needed  Disposition Home   Diet: Heart Healthy with 1.5lit/day fluid restriction  Special Instructions: If you have smoked or chewed Tobacco  in the last 2 yrs please stop smoking, stop any regular Alcohol  and or any Recreational drug use.  On your next visit with your primary care physician please Get Medicines reviewed and adjusted.  Please request your Prim.MD to go over all Hospital Tests and Procedure/Radiological results at the follow up, please get all Hospital records sent to your Prim MD by signing hospital release before you go home.  If you experience worsening of your admission symptoms, develop shortness of breath, life threatening emergency, suicidal or homicidal thoughts you must seek medical attention immediately by calling 911 or calling your MD immediately  if symptoms less severe.  You Must read complete instructions/literature along with all the possible adverse reactions/side effects for all the Medicines you take and that have been prescribed to you. Take any new Medicines after you have completely understood and accpet all the possible adverse reactions/side effects.   Do not drive, operate heavy machinery, perform activities at heights, swimming or participation in water activities or provide baby sitting services if your were admitted for syncope or siezures until you have seen by Primary MD or a Neurologist and advised to do so again.  Do not drive when taking Pain medications.  Do not take more than prescribed Pain, Sleep and Anxiety Medications  Wear Seat belts while driving.   Please note  You were cared for by a hospitalist during your hospital stay. If you have any questions about your discharge medications or the  care you received while you were in the hospital after you are discharged, you can call the unit and asked to speak with the hospitalist on call if the hospitalist that took care of you is not available. Once you are discharged, your primary care physician will handle any further medical issues. Please note that NO REFILLS for any discharge medications will be authorized once you are discharged, as it is imperative that you return to your primary care physician (or establish a relationship with a primary care physician if you do not have one) for your aftercare needs so that they can reassess your need for medications and monitor your lab values.      Person Under Monitoring Name: Rodney Burns  Location: [address] [place] [address]   Infection Prevention Recommendations for Individuals Confirmed to have, or Being Evaluated for, 2019 Novel Coronavirus (COVID-19) Infection Who Receive Care at Home  Individuals who are confirmed to have, or are being evaluated for, COVID-19 should follow the prevention steps below until a healthcare provider or local or state health department says they can return to normal activities.  Stay home except to get medical care You should restrict activities outside your home, except for getting medical care. Do not go to work, school, or public areas, and do not use public transportation or taxis.  Call ahead before visiting your doctor Before your medical appointment, call the healthcare provider and tell them that you have, or are being evaluated for, COVID-19 infection. This will help the healthcare providers office take steps to keep other people from  getting infected. Ask your healthcare provider to call the local or state health department.  Monitor your symptoms Seek prompt medical attention if your illness is worsening (e.g., difficulty breathing). Before going to your medical appointment, call the healthcare provider and tell them that you have, or  are being evaluated for, COVID-19 infection. Ask your healthcare provider to call the local or state health department.  Wear a facemask You should wear a facemask that covers your nose and mouth when you are in the same room with other people and when you visit a healthcare provider. People who live with or visit you should also wear a facemask while they are in the same room with you.  Separate yourself from other people in your home As much as possible, you should stay in a different room from other people in your home. Also, you should use a separate bathroom, if available.  Avoid sharing household items You should not share dishes, drinking glasses, cups, eating utensils, towels, bedding, or other items with other people in your home. After using these items, you should wash them thoroughly with soap and water.  Cover your coughs and sneezes Cover your mouth and nose with a tissue when you cough or sneeze, or you can cough or sneeze into your sleeve. Throw used tissues in a lined trash can, and immediately wash your hands with soap and water for at least 20 seconds or use an alcohol-based hand rub.  Wash your [facility] your hands often and thoroughly with soap and water for at least 20 seconds. You can use an alcohol-based hand sanitizer if soap and water are not available and if your hands are not visibly dirty. Avoid touching your eyes, nose, and mouth with unwashed hands.   Prevention Steps for Caregivers and Household Members of Individuals Confirmed to have, or Being Evaluated for, COVID-19 Infection Being Cared for in the Home  If you live with, or provide care at home for, a person confirmed to have, or being evaluated for, COVID-19 infection please follow these guidelines to prevent infection:  Follow healthcare providers instructions Make sure that you understand and can help the patient follow any healthcare provider instructions for all care.  Provide for the  patients basic needs You should help the patient with basic needs in the home and provide support for getting groceries, prescriptions, and other personal needs.  Monitor the patients symptoms If they are getting sicker, call his or her medical provider and tell them that the patient has, or is being evaluated for, COVID-19 infection. This will help the healthcare providers office take steps to keep other people from getting infected. Ask the healthcare provider to call the local or state health department.  Limit the number of people who have contact with the patient  If possible, have only one caregiver for the patient.  Other household members should stay in another home or place of residence. If this is not possible, they should stay  in another room, or be separated from the patient as much as possible. Use a separate bathroom, if available.  Restrict visitors who do not have an essential need to be in the home.  Keep older adults, very young children, and other sick people away from the patient Keep older adults, very young children, and those who have compromised immune systems or chronic health conditions away from the patient. This includes people with chronic heart, lung, or kidney conditions, diabetes, and cancer.  Ensure good ventilation Make sure that  shared spaces in the home have good air flow, such as from an air conditioner or an opened window, weather permitting.  Wash your hands often  Wash your hands often and thoroughly with soap and water for at least 20 seconds. You can use an alcohol based hand sanitizer if soap and water are not available and if your hands are not visibly dirty.  Avoid touching your eyes, nose, and mouth with unwashed hands.  Use disposable paper towels to dry your hands. If not available, use dedicated cloth towels and replace them when they become wet.  Wear a facemask and gloves  Wear a disposable facemask at all times in the room  and gloves when you touch or have contact with the patients blood, body fluids, and/or secretions or excretions, such as sweat, saliva, sputum, nasal mucus, vomit, urine, or feces.  Ensure the mask fits over your nose and mouth tightly, and do not touch it during use.  Throw out disposable facemasks and gloves after using them. Do not reuse.  Wash your hands immediately after removing your facemask and gloves.  If your personal clothing becomes contaminated, carefully remove clothing and launder. Wash your hands after handling contaminated clothing.  Place all used disposable facemasks, gloves, and other waste in a lined container before disposing them with other household waste.  Remove gloves and wash your hands immediately after handling these items.  Do not share dishes, glasses, or other household items with the patient  Avoid sharing household items. You should not share dishes, drinking glasses, cups, eating utensils, towels, bedding, or other items with a patient who is confirmed to have, or being evaluated for, COVID-19 infection.  After the person uses these items, you should wash them thoroughly with soap and water.  Wash laundry thoroughly  Immediately remove and wash clothes or bedding that have blood, body fluids, and/or secretions or excretions, such as sweat, saliva, sputum, nasal mucus, vomit, urine, or feces, on them.  Wear gloves when handling laundry from the patient.  Read and follow directions on labels of laundry or clothing items and detergent. In general, wash and dry with the warmest temperatures recommended on the label.  Clean all areas the individual has used often  Clean all touchable surfaces, such as counters, tabletops, doorknobs, bathroom fixtures, toilets, phones, keyboards, tablets, and bedside tables, every day. Also, clean any surfaces that may have blood, body fluids, and/or secretions or excretions on them.  Wear gloves when cleaning surfaces the  patient has come in contact with.  Use a diluted bleach solution (e.g., dilute bleach with 1 part bleach and 10 parts water) or a household disinfectant with a label that says EPA-registered for coronaviruses. To make a bleach solution at home, add 1 tablespoon of bleach to 1 quart (4 cups) of water. For a larger supply, add  cup of bleach to 1 gallon (16 cups) of water.  Read labels of cleaning products and follow recommendations provided on product labels. Labels contain instructions for safe and effective use of the cleaning product including precautions you should take when applying the product, such as wearing gloves or eye protection and making sure you have good ventilation during use of the product.  Remove gloves and wash hands immediately after cleaning.  Monitor yourself for signs and symptoms of illness Caregivers and household members are considered close contacts, should monitor their health, and will be asked to limit movement outside of the home to the extent possible. Follow the monitoring steps for  close contacts listed on the symptom monitoring form.   ? If you have additional questions, contact your local health department or call the epidemiologist on call at [phone] (available 24/7). ? This guidance is subject to change. For the most up-to-date guidance from [facility], please refer to their website: YouBlogs.pl     Information on my medicine - XARELTO (Rivaroxaban)  This medication education was reviewed with me or my healthcare representative as part of my discharge preparation.    Why was Xarelto prescribed for you? Xarelto was prescribed for you to reduce the risk of a blood clot forming that can cause a stroke if you have a medical condition called atrial fibrillation (a type of irregular heartbeat).  What do you need to know about xarelto ? Take your Xarelto ONCE DAILY at the same time every  day with your evening meal. If you have difficulty swallowing the tablet whole, you may crush it and mix in applesauce just prior to taking your dose.  Take Xarelto exactly as prescribed by your doctor and DO NOT stop taking Xarelto without talking to the doctor who prescribed the medication.  Stopping without other stroke prevention medication to take the place of Xarelto may increase your risk of developing a clot that causes a stroke.  Refill your prescription before you run out.  After discharge, you should have regular check-up appointments with your healthcare provider that is prescribing your Xarelto.  In the future your dose may need to be changed if your kidney function or weight changes by a significant amount.  What do you do if you miss a dose? If you are taking Xarelto ONCE DAILY and you miss a dose, take it as soon as you remember on the same day then continue your regularly scheduled once daily regimen the next day. Do not take two doses of Xarelto at the same time or on the same day.   Important Safety Information A possible side effect of Xarelto is bleeding. You should call your healthcare provider right away if you experience any of the following: ? Bleeding from an injury or your nose that does not stop. ? Unusual colored urine (red or dark brown) or unusual colored stools (red or black). ? Unusual bruising for unknown reasons. ? A serious fall or if you hit your head (even if there is no bleeding).  Some medicines may interact with Xarelto and might increase your risk of bleeding while on Xarelto. To help avoid this, consult your healthcare provider or pharmacist prior to using any new prescription or non-prescription medications, including herbals, vitamins, non-steroidal anti-inflammatory drugs (NSAIDs) and supplements.  This website has more information on Xarelto: [email or web address]

## 2018-12-26 ENCOUNTER — Inpatient Hospital Stay (HOSPITAL_COMMUNITY): Payer: Medicare Other

## 2018-12-26 LAB — COMPREHENSIVE METABOLIC PANEL
ALT: 311 U/L — ABNORMAL HIGH (ref 0–44)
AST: 118 U/L — ABNORMAL HIGH (ref 15–41)
Albumin: 2.9 g/dL — ABNORMAL LOW (ref 3.5–5.0)
Alkaline Phosphatase: 44 U/L (ref 38–126)
Anion gap: 12 (ref 5–15)
BUN: 35 mg/dL — ABNORMAL HIGH (ref 8–23)
CO2: 24 mmol/L (ref 22–32)
Calcium: 9 mg/dL (ref 8.9–10.3)
Chloride: 102 mmol/L (ref 98–111)
Creatinine, Ser: 0.92 mg/dL (ref 0.61–1.24)
GFR calc Af Amer: >60 mL/min (ref >60)
GFR calc non Af Amer: >60 mL/min (ref >60)
Glucose, Bld: 169 mg/dL — ABNORMAL HIGH (ref 70–99)
Potassium: 4.4 mmol/L (ref 3.5–5.1)
Sodium: 138 mmol/L (ref 135–145)
Total Bilirubin: 0.8 mg/dL (ref 0.3–1.2)
Total Protein: 6.1 g/dL — ABNORMAL LOW (ref 6.5–8.1)

## 2018-12-26 LAB — CBC WITH DIFFERENTIAL/PLATELET
Abs Immature Granulocytes: 0.1 10*3/uL — ABNORMAL HIGH (ref 0.00–0.07)
Basophils Absolute: 0.1 10*3/uL (ref 0.0–0.1)
Basophils Relative: 0 %
Eosinophils Absolute: 0 10*3/uL (ref 0.0–0.5)
Eosinophils Relative: 0 %
HCT: 42.9 % (ref 39.0–52.0)
Hemoglobin: 13.5 g/dL (ref 13.0–17.0)
Immature Granulocytes: 0 %
Lymphocytes Relative: 56 %
Lymphs Abs: 13.1 10*3/uL — ABNORMAL HIGH (ref 0.7–4.0)
MCH: 26.3 pg (ref 26.0–34.0)
MCHC: 31.5 g/dL (ref 30.0–36.0)
MCV: 83.5 fL (ref 80.0–100.0)
Monocytes Absolute: 0.6 10*3/uL (ref 0.1–1.0)
Monocytes Relative: 2 %
Neutro Abs: 10 10*3/uL — ABNORMAL HIGH (ref 1.7–7.7)
Neutrophils Relative %: 42 %
Platelets: 224 10*3/uL (ref 150–400)
RBC: 5.14 MIL/uL (ref 4.22–5.81)
RDW: 14.4 % (ref 11.5–15.5)
WBC: 23.9 10*3/uL — ABNORMAL HIGH (ref 4.0–10.5)
nRBC: 0 % (ref 0.0–0.2)

## 2018-12-26 LAB — GLUCOSE, CAPILLARY
Glucose-Capillary: 122 mg/dL — ABNORMAL HIGH (ref 70–99)
Glucose-Capillary: 169 mg/dL — ABNORMAL HIGH (ref 70–99)
Glucose-Capillary: 153 mg/dL — ABNORMAL HIGH (ref 70–99)

## 2018-12-26 LAB — PHOSPHORUS: Phosphorus: 4.4 mg/dL (ref 2.5–4.6)

## 2018-12-26 LAB — D-DIMER, QUANTITATIVE: D-Dimer, Quant: 1.02 ug/mL-FEU — ABNORMAL HIGH (ref 0.00–0.50)

## 2018-12-26 LAB — C-REACTIVE PROTEIN: CRP: 3.9 mg/dL — ABNORMAL HIGH (ref <1.0)

## 2018-12-26 LAB — PROCALCITONIN: Procalcitonin: <0.1 ng/mL

## 2018-12-26 LAB — FERRITIN: Ferritin: 290 ng/mL (ref 24–336)

## 2018-12-26 LAB — MAGNESIUM: Magnesium: 2.1 mg/dL (ref 1.7–2.4)

## 2018-12-26 MED ORDER — METHYLPREDNISOLONE SODIUM SUCC 40 MG IJ SOLR
40.0000 mg | Freq: Every day | INTRAMUSCULAR | Status: DC
  Administered 2018-12-27 – 2018-12-28 (×2): 40 mg via INTRAVENOUS
  Filled 2018-12-26 (×2): qty 1

## 2018-12-26 NOTE — Progress Notes (Signed)
PROGRESS NOTE                                                                                                                                                                                                             Patient Demographics:    Rodney Burns, is a 73 y.o. male, DOB - 09-25-1945, WNI:[id number]  Outpatient Primary MD for the patient is Hulan Fess, MD    LOS - 2  Admit date - 12/24/2018    Chief Complaint  Patient presents with  . Covid/SOB  . r/o covid       Brief Narrative - Patient is a 73 y.o. male with PMHx of chronic atrial fibrillation-status post ablation-on Xarelto, history of CAD-s/p CABG 3818, chronic systolic heart failure, HTN, HLD-scented with 3-4-day history of worsening shortness of breath-he had been feeling unwell with fatigue and malaise for approximately 10 days.  In the ED, he was found to have acute hypoxic respiratory failure requiring around 3-4 L of oxygen, he was also found to have significant transaminitis.  He was transferred to [facility] for further evaluation and treatment.   Subjective:    Rodney Burns today has, No headache, No chest pain, No abdominal pain - No Nausea, No new weakness tingling or numbness, no Cough - improved SOB.     Assessment  & Plan :     1. Acute Hypoxic Resp. Failure due to Acute Covid 19 Viral Pneumonitis during the ongoing 2020 Covid 19 Pandemic - he had severe disease, currently on 4 L nasal cannula oxygen, when he moves he requires higher flow, at rest he is feeling good, he has been treated with steroids and convalescent plasma.  Due to severe transaminitis he could not get remdesivir or Actemra.  Continue to monitor closely.  Encouraged him to sit up in chair in the daytime use I-S and flutter valve for pulmonary toiletry and then prone in bed when at night.  COVID-19 Medications: Steroids:12/3>> Remdesivir: Not given due to ALT> 250 Actemra: Will avoid  given significant ALT/AST elevation Convalescent Plasma: x 1 on 12/3  SpO2: 91 % O2 Flow Rate (L/min): 6 L/min  Recent Labs  Lab 12/24/18 1134 12/24/18 1305 12/25/18 0000 12/26/18 0041  CRP 7.3*  --  7.6* 3.9*  DDIMER 2.12*  --  1.84* 1.02*  FERRITIN 510* 490* 367* 290  PROCALCITON <0.10 <0.10  --  <0.10    Hepatic Function Latest Ref Rng & Units 12/26/2018 12/25/2018 12/24/2018  Total Protein 6.5 - 8.1 g/dL 6.1(L) 6.0(L) 6.7  Albumin 3.5 - 5.0 g/dL 2.9(L) 2.8(L) 3.3(L)  AST 15 - 41 U/L 118(H) 186(H) 363(H)  ALT 0 - 44 U/L 311(H) 405(H) 507(H)  Alk Phosphatase 38 - 126 U/L 44 47 56  Total Bilirubin 0.3 - 1.2 mg/dL 0.8 1.0 2.0(H)     2.  Transaminitis.  Due to COVID-19 infection.  Trend is improving and he is symptom-free monitor.  3.  Chronic systolic heart failure with EF 45% on echocardiogram done 10/21/2017.  Continue IV Lasix at present dose,  4.  Chronic atrial fibrillation.  Mali vas 2 score of at least 3.  On beta-blocker and Xarelto.  5.  CAD.  Stable no acute issues on beta-blocker, statin and Xarelto for secondary prevention.  6.  GERD.  On PPI.  7.  Leukocytosis.  Likely steroid-induced.  Afebrile, stable procalcitonin.  Will trend and repeat chest x-ray.     Condition - Fair continue beta-blocker for now.  Family Communication  :  Spouse/Daughter updated over the phone - by previous MD  Code Status :  Full  Diet :   Diet Order            Diet Heart Room service appropriate? Yes; Fluid consistency: Thin  Diet effective now               Disposition Plan  :  Home in 1-2 days  Consults  :  None  Procedures  :    PUD Prophylaxis : PPI  DVT Prophylaxis  :  Xaralto  Lab Results  Component Value Date   PLT 224 12/26/2018    Inpatient Medications  Scheduled Meds: . folic acid  1 mg Oral Daily  . furosemide  40 mg Intravenous Daily  . insulin aspart  0-9 Units Subcutaneous TID WC  . Ipratropium-Albuterol  1 puff Inhalation Q6H  .  latanoprost  1 drop Both Eyes QHS  . magnesium oxide  400 mg Oral QHS  . [START ON 12/27/2018] methylPREDNISolone (SOLU-MEDROL) injection  40 mg Intravenous Daily  . metoprolol succinate  25 mg Oral Daily  . pantoprazole  40 mg Oral Daily  . pramipexole  1.5 mg Oral Daily  . rivaroxaban  20 mg Oral Q supper  . sodium chloride flush  3 mL Intravenous Q12H  . thiamine  100 mg Oral Daily   Continuous Infusions: PRN Meds:.acetaminophen, guaiFENesin-dextromethorphan, [DISCONTINUED] ondansetron **OR** ondansetron (ZOFRAN) IV, traMADol  Antibiotics  :    Anti-infectives (From admission, onward)   Start     Dose/Rate Route Frequency Ordered Stop   12/24/18 1400  levofloxacin (LEVAQUIN) IVPB 750 mg  Status:  Discontinued     750 mg 100 mL/hr over 90 Minutes Intravenous Every 24 hours 12/24/18 1305 12/24/18 1712       Time Spent in minutes  30   Lala Lund M.D on 12/26/2018 at 10:12 AM  To page go to [email or web address] - password [facility]  [facility] -  Office  [phone]  See all Orders from today for further details    Objective:   Vitals:   12/25/18 2357 12/26/18 0423 12/26/18 0426 12/26/18 0820  BP: 121/77  122/78 129/77  Pulse: 94  (!) 106 94  Resp: 20  18 (!) 22  Temp: 97.8 F (36.6 C) 97.7 F (36.5 C)  97.8 F (36.6 C)  TempSrc: Oral Oral  Oral  SpO2: 94%  90% 91%  Weight:      Height:        Wt Readings from Last 3 Encounters:  12/24/18 95.3 kg  08/27/18 97.6 kg  01/27/18 100.8 kg     Intake/Output Summary (Last 24 hours) at 12/26/2018 1012 Last data filed at 12/26/2018 0920 Gross per 24 hour  Intake 970 ml  Output 1300 ml  Net -330 ml     Physical Exam  Awake Alert, Oriented X 3, No new F.N deficits, Normal affect [place].AT,PERRAL Supple Neck,No JVD, No cervical lymphadenopathy appriciated.  Symmetrical Chest wall movement, Good air movement bilaterally, fine rales RRR,No Gallops,Rubs or new Murmurs, No Parasternal Heave +ve B.Sounds, Abd Soft,  No tenderness, No organomegaly appriciated, No rebound - guarding or rigidity. No Cyanosis, Clubbing or edema, No new Rash or bruise      Data Review:    CBC Recent Labs  Lab 12/24/18 1053 12/24/18 1134 12/25/18 0000 12/26/18 0041  WBC 23.2* 19.7* 16.8* 23.9*  HGB 15.3 15.6 13.8 13.5  HCT 49.0 49.2 44.1 42.9  PLT 215 227 189 224  MCV 84.3 83.1 83.7 83.5  MCH 26.3 26.4 26.2 26.3  MCHC 31.2 31.7 31.3 31.5  RDW 14.4 14.2 14.4 14.4  LYMPHSABS  --  12.2* 10.4* 13.1*  MONOABS  --  0.5 0.2 0.6  EOSABS  --  0.0 0.0 0.0  BASOSABS  --  0.0 0.0 0.1    Chemistries  Recent Labs  Lab 12/24/18 1053 12/24/18 1134 12/25/18 0000 12/26/18 0041  NA 135 134* 134* 138  K 4.5 4.5 4.2 4.4  CL 101 98 102 102  CO2 19* '23 22 24  ' GLUCOSE 107* 110* 220* 169*  BUN '19 19 22 ' 35*  CREATININE 0.96 1.00 0.83 0.92  CALCIUM 8.7* 8.7* 8.7* 9.0  MG  --   --  2.1 2.1  AST  --  363* 186* 118*  ALT  --  507* 405* 311*  ALKPHOS  --  56 47 44  BILITOT  --  2.0* 1.0 0.8   ------------------------------------------------------------------------------------------------------------------ Recent Labs    12/24/18 1134  TRIG 93    Lab Results  Component Value Date   HGBA1C 6.7 (H) 12/24/2018   ------------------------------------------------------------------------------------------------------------------ No results for input(s): TSH, T4TOTAL, T3FREE, THYROIDAB in the last 72 hours.  Invalid input(s): FREET3  Cardiac Enzymes No results for input(s): CKMB, TROPONINI, MYOGLOBIN in the last 168 hours.  Invalid input(s): CK ------------------------------------------------------------------------------------------------------------------    Component Value Date/Time   BNP 249.1 (H) 10/16/2017 1435   BNP 175.5 (H) 02/15/2015 1731    Micro Results Recent Results (from the past 240 hour(s))  Blood Culture (routine x 2)     Status: None (Preliminary result)   Collection Time: 12/24/18 11:35 AM    Specimen: BLOOD  Result Value Ref Range Status   Specimen Description BLOOD RIGHT ANTECUBITAL  Final   Special Requests   Final    BOTTLES DRAWN AEROBIC AND ANAEROBIC Blood Culture adequate volume   Culture   Final    NO GROWTH < 24 HOURS Performed at [facility], [place] [address], [place], [place] [address]    Report Status PENDING  Incomplete  Blood Culture (routine x 2)     Status: None (Preliminary result)   Collection Time: 12/24/18 12:24 PM   Specimen: BLOOD  Result Value Ref Range Status   Specimen Description BLOOD LEFT ANTECUBITAL  Final   Special Requests  Final    BOTTLES DRAWN AEROBIC AND ANAEROBIC Blood Culture results may not be optimal due to an inadequate volume of blood received in culture bottles   Culture   Final    NO GROWTH < 24 HOURS Performed at [facility] [address] [place],  [address]    Report Status PENDING  Incomplete    Radiology Reports Dg Chest Port Am  Result Date: 12/25/2018 CLINICAL DATA:  Shortness of breath. EXAM: PORTABLE CHEST 1 VIEW COMPARISON:  December 24, 2018. FINDINGS: The heart size and mediastinal contours are within normal limits. No pneumothorax or pleural effusion is noted. Minimally increased diffuse interstitial densities are noted throughout both lungs concerning for atypical inflammation or infection. Status post coronary bypass graft. The visualized skeletal structures are unremarkable. IMPRESSION: Minimally increased diffuse interstitial densities are noted throughout both lungs concerning for atypical inflammation or infection. Followup radiographs are recommended until resolution. Electronically Signed   By: Marijo Conception M.D.   On: 12/25/2018 08:10   Dg Chest Portable 1 View  Result Date: 12/24/2018 CLINICAL DATA:  Cough, shortness of breath EXAM: PORTABLE CHEST 1 VIEW COMPARISON:  03/20/2017 FINDINGS: Post CABG changes. Stable mild cardiomegaly. No focal airspace consolidation. No pleural effusion or  pneumothorax. IMPRESSION: No acute cardiopulmonary findings. Electronically Signed   By: Davina Poke M.D.   On: 12/24/2018 11:13

## 2018-12-26 NOTE — Progress Notes (Signed)
SATURATION QUALIFICATIONS: (This note is used to comply with regulatory documentation for home oxygen)  Patient Saturations on Room Air at Rest = 94%  Patient Saturations on Room Air while Ambulating = 81% min sat  Patient Saturations on  2 Liters of oxygen while Ambulating = 85% min sat  Please briefly explain why patient needs home oxygen: Pt desaturates on room air with mobility and requires supplemental oxygen to maintain a safe saturation level.   Horald Chestnut, PT

## 2018-12-26 NOTE — Evaluation (Signed)
Physical Therapy Evaluation Patient Details Name: Rodney Burns MRN: [id number] DOB: [birth year] Today's Date: 12/26/2018   History of Present Illness  73 y.o. male with PMHx of chronic a-fib-s/p ablation-on Xarelto, hx of CAD-s/p CABG [id number], chronic systolic heart failure, HTN, HLD-scented with 3-4-day hx worsening SOB-he had been feeling unwell with fatigue and malaise for approximately 10 days.  In the ED, he was found to have acute hypoxic respiratory failure requiring around 3-4 L of oxygen, he was also found to have significant transaminitisdx: acute hypoxic respiratory failure due to SARS COVID-19 viral pneumonia.  Clinical Impression   Pt admitted with above diagnosis. PTA was living home with spouse and child w/ Down's Syndrome. States was very independent, he has some AD/DME but did not need to use any of these yet. Pt currently with functional limitations due to the deficits listed below (see PT Problem List). This pm does well with mobility completing most with mod I to SBA. Pt initially on 4L/min via [place] (extended x 2) at rest in recliner and sats in 90s. With ambulation approx 182ft w/ SBA no AD desat to min 84% but quickly able to recover to 90s. Decreased 02 sats to 2L/min via [place] and able to complete 85ft with SBA and min sat 84%. After seated rest break and sats back to 90s on 2L/min, pt placed on room air, at rest sats remained in 90s with 134ft of ambulation able to maintain in 90s but once stopped to rest desat to min 81%, pt placed on 2L/min via [place] and ambulated further 176ft and again desat min 85% Pt will benefit from skilled PT to increase his overall independence, activity tolerance and safety with mobility to allow discharge to the venue listed below.       Follow Up Recommendations No PT follow up    Equipment Recommendations  None recommended by PT    Recommendations for Other Services OT consult     Precautions / Restrictions Precautions Precautions:  Fall Restrictions Weight Bearing Restrictions: No      Mobility  Bed Mobility Overal bed mobility: Modified Independent                Transfers Overall transfer level: Needs assistance Equipment used: None Transfers: Stand Pivot Transfers;Sit to/from Stand Sit to Stand: Supervision Stand pivot transfers: Supervision          Ambulation/Gait Ambulation/Gait assistance: Supervision Gait Distance (Feet): 200 Feet Assistive device: None Gait Pattern/deviations: Step-through pattern        Stairs            Wheelchair Mobility    Modified Rankin (Stroke Patients Only)       Balance Overall balance assessment: Needs assistance Sitting-balance support: Feet supported Sitting balance-[name]: Normal     Standing balance support: During functional activity Standing balance-[name]: Good                               Pertinent Vitals/Pain Pain Assessment: No/denies pain    Home Living Family/patient expects to be discharged to:: Private residence Living Arrangements: Children Available Help at Discharge: Family Type of Home: House       Home Layout: Two level;Able to live on main level with bedroom/bathroom Home Equipment: Shower seat;Walker - 2 wheels;Grab bars - toilet;Grab bars - tub/shower      Prior Function Level of Independence: Independent  Hand Dominance   Dominant Hand: Right    Extremity/Trunk Assessment   Upper Extremity Assessment Upper Extremity Assessment: Overall WFL for tasks assessed    Lower Extremity Assessment Lower Extremity Assessment: Overall WFL for tasks assessed    Cervical / Trunk Assessment Cervical / Trunk Assessment: Normal  Communication   Communication: No difficulties  Cognition Arousal/Alertness: Awake/alert Behavior During Therapy: WFL for tasks assessed/performed Overall Cognitive Status: Within Functional Limits for tasks assessed                                         General Comments      Exercises     Assessment/Plan    PT Assessment Patient needs continued PT services  PT Problem List Decreased mobility;Decreased activity tolerance;Decreased balance;Decreased coordination       PT Treatment Interventions      PT Goals (Current goals can be found in the Care Plan section)  Acute Rehab PT Goals Patient Stated Goal: to go home within next few days PT Goal Formulation: With patient Time For Goal Achievement: 01/09/19 Potential to Achieve Goals: Good    Frequency Min 3X/week   Barriers to discharge   lives home with spouse and son w/ down's syndrome    Co-evaluation               AM-PAC PT "6 Clicks" Mobility  Outcome Measure Help needed turning from your back to your side while in a flat bed without using bedrails?: None Help needed moving from lying on your back to sitting on the side of a flat bed without using bedrails?: None Help needed moving to and from a bed to a chair (including a wheelchair)?: A Little Help needed standing up from a chair using your arms (e.g., wheelchair or bedside chair)?: A Little Help needed to walk in hospital room?: A Little Help needed climbing 3-5 steps with a railing? : A Little 6 Click Score: 20    End of Session Equipment Utilized During Treatment: Oxygen Activity Tolerance: Patient tolerated treatment well Patient left: in chair;with call bell/phone within reach Nurse Communication: Mobility status;Other (comment)(02 status) PT Visit Diagnosis: Other abnormalities of gait and mobility (R26.89)    Time: [id number] PT Time Calculation (min) (ACUTE ONLY): 25 min   Charges:   PT Evaluation $PT Eval Moderate Complexity: 1 Mod PT Treatments $Gait Training: 8-22 mins        Horald Chestnut, PT   Delford Field 12/26/2018, 12:57 PM

## 2018-12-27 LAB — CBC WITH DIFFERENTIAL/PLATELET
Abs Immature Granulocytes: 0.13 10*3/uL — ABNORMAL HIGH (ref 0.00–0.07)
Basophils Absolute: 0.1 10*3/uL (ref 0.0–0.1)
Basophils Relative: 0 %
Eosinophils Absolute: 0 10*3/uL (ref 0.0–0.5)
Eosinophils Relative: 0 %
HCT: 44.9 % (ref 39.0–52.0)
Hemoglobin: 13.8 g/dL (ref 13.0–17.0)
Immature Granulocytes: 1 %
Lymphocytes Relative: 59 %
Lymphs Abs: 15.7 10*3/uL — ABNORMAL HIGH (ref 0.7–4.0)
MCH: 25.9 pg — ABNORMAL LOW (ref 26.0–34.0)
MCHC: 30.7 g/dL (ref 30.0–36.0)
MCV: 84.2 fL (ref 80.0–100.0)
Monocytes Absolute: 1 10*3/uL (ref 0.1–1.0)
Monocytes Relative: 4 %
Neutro Abs: 9.4 10*3/uL — ABNORMAL HIGH (ref 1.7–7.7)
Neutrophils Relative %: 36 %
Platelets: 252 10*3/uL (ref 150–400)
RBC: 5.33 MIL/uL (ref 4.22–5.81)
RDW: 14.4 % (ref 11.5–15.5)
WBC: 26.3 10*3/uL — ABNORMAL HIGH (ref 4.0–10.5)
nRBC: 0 % (ref 0.0–0.2)

## 2018-12-27 LAB — D-DIMER, QUANTITATIVE: D-Dimer, Quant: 0.89 ug/mL-FEU — ABNORMAL HIGH (ref 0.00–0.50)

## 2018-12-27 LAB — GLUCOSE, CAPILLARY
Glucose-Capillary: 95 mg/dL (ref 70–99)
Glucose-Capillary: 129 mg/dL — ABNORMAL HIGH (ref 70–99)
Glucose-Capillary: 262 mg/dL — ABNORMAL HIGH (ref 70–99)
Glucose-Capillary: 178 mg/dL — ABNORMAL HIGH (ref 70–99)

## 2018-12-27 LAB — COMPREHENSIVE METABOLIC PANEL
ALT: 299 U/L — ABNORMAL HIGH (ref 0–44)
AST: 104 U/L — ABNORMAL HIGH (ref 15–41)
Albumin: 3.1 g/dL — ABNORMAL LOW (ref 3.5–5.0)
Alkaline Phosphatase: 45 U/L (ref 38–126)
Anion gap: 12 (ref 5–15)
BUN: 42 mg/dL — ABNORMAL HIGH (ref 8–23)
CO2: 27 mmol/L (ref 22–32)
Calcium: 9.1 mg/dL (ref 8.9–10.3)
Chloride: 101 mmol/L (ref 98–111)
Creatinine, Ser: 1.19 mg/dL (ref 0.61–1.24)
GFR calc Af Amer: >60 mL/min (ref >60)
GFR calc non Af Amer: 60 mL/min — ABNORMAL LOW (ref >60)
Glucose, Bld: 113 mg/dL — ABNORMAL HIGH (ref 70–99)
Potassium: 5.5 mmol/L — ABNORMAL HIGH (ref 3.5–5.1)
Sodium: 140 mmol/L (ref 135–145)
Total Bilirubin: 1.1 mg/dL (ref 0.3–1.2)
Total Protein: 6.2 g/dL — ABNORMAL LOW (ref 6.5–8.1)

## 2018-12-27 LAB — MAGNESIUM: Magnesium: 2.2 mg/dL (ref 1.7–2.4)

## 2018-12-27 LAB — PROCALCITONIN: Procalcitonin: <0.1 ng/mL

## 2018-12-27 LAB — BRAIN NATRIURETIC PEPTIDE: B Natriuretic Peptide: 222.5 pg/mL — ABNORMAL HIGH (ref 0.0–100.0)

## 2018-12-27 LAB — C-REACTIVE PROTEIN: CRP: 1.5 mg/dL — ABNORMAL HIGH (ref <1.0)

## 2018-12-27 MED ORDER — METOPROLOL SUCCINATE ER 25 MG PO TB24
25.0000 mg | ORAL_TABLET | Freq: Once | ORAL | Status: AC
  Administered 2018-12-27: 25 mg via ORAL
  Filled 2018-12-27: qty 1

## 2018-12-27 MED ORDER — DIGOXIN 0.25 MG/ML IJ SOLN
0.1250 mg | Freq: Four times a day (QID) | INTRAMUSCULAR | Status: AC
  Administered 2018-12-27: 0.125 mg via INTRAVENOUS
  Filled 2018-12-27 (×3): qty 0.5

## 2018-12-27 MED ORDER — SODIUM POLYSTYRENE SULFONATE 15 GM/60ML PO SUSP
15.0000 g | Freq: Once | ORAL | Status: AC
  Administered 2018-12-27: 15 g via ORAL
  Filled 2018-12-27: qty 60

## 2018-12-27 MED ORDER — METOPROLOL SUCCINATE ER 25 MG PO TB24
50.0000 mg | ORAL_TABLET | Freq: Every day | ORAL | Status: DC
  Administered 2018-12-28 – 2018-12-29 (×2): 50 mg via ORAL
  Filled 2018-12-27 (×2): qty 2

## 2018-12-27 NOTE — Plan of Care (Signed)
  Problem: Respiratory: Goal: Will maintain a patent airway Outcome: Progressing   Problem: Clinical Measurements: Goal: Ability to maintain clinical measurements within normal limits will improve Outcome: Progressing   Problem: Clinical Measurements: Goal: Cardiovascular complication will be avoided Outcome: Progressing   

## 2018-12-27 NOTE — Progress Notes (Signed)
PROGRESS NOTE                                                                                                                                                                                                             Patient Demographics:    Rodney Burns, is a 73 y.o. male, DOB - 1945/03/13, ZOX:[id number]  Outpatient Primary MD for the patient is Hulan Fess, MD    LOS - 3  Admit date - 12/24/2018    Chief Complaint  Patient presents with  . Covid/SOB  . r/o covid       Brief Narrative - Patient is a 73 y.o. male with PMHx of chronic atrial fibrillation-status post ablation-on Xarelto, history of CAD-s/p CABG 8119, chronic systolic heart failure, HTN, HLD-scented with 3-4-day history of worsening shortness of breath-he had been feeling unwell with fatigue and malaise for approximately 10 days.  In the ED, he was found to have acute hypoxic respiratory failure requiring around 3-4 L of oxygen, he was also found to have significant transaminitis.  He was transferred to [facility] for further evaluation and treatment.   Subjective:   Patient in bed, appears comfortable, denies any headache, no fever, no chest pain or pressure, no shortness of breath , no abdominal pain. No focal weakness.    Assessment  & Plan :     1. Acute Hypoxic Resp. Failure due to Acute Covid 19 Viral Pneumonitis during the ongoing 2020 Covid 19 Pandemic - he had severe disease, currently on 4 L nasal cannula oxygen, when he moves he requires higher flow, at rest he is feeling good, he has been treated with steroids and convalescent plasma.  Due to severe transaminitis he could not get remdesivir or Actemra.  Continue to monitor closely.  Encouraged him to sit up in chair in the daytime use I-S and flutter valve for pulmonary toiletry and then prone in bed when at night.  COVID-19 Medications:  Steroids:12/3>> Remdesivir: Not given due to ALT> 250  Actemra: Will avoid given significant ALT/AST elevation Convalescent Plasma: x 1 on 12/3  SpO2: (!) 89 % O2 Flow Rate (L/min): 4 L/min  Recent Labs  Lab 12/24/18 1134 12/24/18 1305 12/25/18 0000 12/26/18 0041 12/27/18 0148  CRP 7.3*  --  7.6* 3.9* 1.5*  DDIMER 2.12*  --  1.84* 1.02* 0.89*  FERRITIN 510*  490* 367* 290  --   PROCALCITON <0.10 <0.10  --  <0.10 <0.10    Hepatic Function Latest Ref Rng & Units 12/27/2018 12/26/2018 12/25/2018  Total Protein 6.5 - 8.1 g/dL 6.2(L) 6.1(L) 6.0(L)  Albumin 3.5 - 5.0 g/dL 3.1(L) 2.9(L) 2.8(L)  AST 15 - 41 U/L 104(H) 118(H) 186(H)  ALT 0 - 44 U/L 299(H) 311(H) 405(H)  Alk Phosphatase 38 - 126 U/L 45 44 47  Total Bilirubin 0.3 - 1.2 mg/dL 1.1 0.8 1.0     2.  Transaminitis.  Due to COVID-19 infection.  Trend is improving and he is symptom-free monitor.  3.  Chronic systolic heart failure with EF 45% on echocardiogram done 10/21/2017.  Appears euvolemic.  Stop Lasix on 12/27/2018.  4.  Chronic atrial fibrillation.  In RVR, increase Toprol-XL, 2 doses of IV digoxin on 12/27/2018, monitor on telemetry, continue Xarelto.  Mali vas 2 score of at least 3.  5.  CAD.  Stable no acute issues on beta-blocker, statin and Xarelto for secondary prevention.  6.  GERD.  On PPI.  7.  Leukocytosis.  Likely steroid-induced.  Afebrile, stable procalcitonin.  Will trend and repeat chest x-ray.   8.  Hyperkalemia.  1 dose Lasix and Kayexalate and monitor.    Condition - Fair continue beta-blocker for now.  Family Communication  :  Spouse/Daughter updated over the phone - by previous MD, wife Shawnta Zimbelman on 12/27/2018.  Code Status :  Full  Diet :   Diet Order            Diet Heart Room service appropriate? Yes; Fluid consistency: Thin  Diet effective now               Disposition Plan  :  Home in 1-2 days  Consults  :  None  Procedures  :    PUD Prophylaxis : PPI  DVT Prophylaxis  :  Xaralto  Lab Results  Component Value Date   PLT  252 12/27/2018    Inpatient Medications  Scheduled Meds: . digoxin  0.125 mg Intravenous M5H  . folic acid  1 mg Oral Daily  . insulin aspart  0-9 Units Subcutaneous TID WC  . Ipratropium-Albuterol  1 puff Inhalation Q6H  . latanoprost  1 drop Both Eyes QHS  . magnesium oxide  400 mg Oral QHS  . methylPREDNISolone (SOLU-MEDROL) injection  40 mg Intravenous Daily  . metoprolol succinate  25 mg Oral Once  . [START ON 12/28/2018] metoprolol succinate  50 mg Oral Daily  . pantoprazole  40 mg Oral Daily  . pramipexole  1.5 mg Oral Daily  . rivaroxaban  20 mg Oral Q supper  . sodium chloride flush  3 mL Intravenous Q12H  . sodium polystyrene  15 g Oral Once  . thiamine  100 mg Oral Daily   Continuous Infusions: PRN Meds:.acetaminophen, guaiFENesin-dextromethorphan, [DISCONTINUED] ondansetron **OR** ondansetron (ZOFRAN) IV, traMADol  Antibiotics  :    Anti-infectives (From admission, onward)   Start     Dose/Rate Route Frequency Ordered Stop   12/24/18 1400  levofloxacin (LEVAQUIN) IVPB 750 mg  Status:  Discontinued     750 mg 100 mL/hr over 90 Minutes Intravenous Every 24 hours 12/24/18 1305 12/24/18 1712       Time Spent in minutes  30   Lala Lund M.D on 12/27/2018 at 11:00 AM  To page go to [email or web address] - password [email or web address]  [facility] -  Office  [phone]  See all Orders from today  for further details    Objective:   Vitals:   12/26/18 2007 12/27/18 0115 12/27/18 0425 12/27/18 0740  BP: 139/89   115/80  Pulse: 95   (!) 107  Resp: '20 20 20 ' (!) 21  Temp: 97.7 F (36.5 C) 97.7 F (36.5 C) 97.6 F (36.4 C) 99 F (37.2 C)  TempSrc: Oral Oral Oral Axillary  SpO2: 92%   (!) 89%  Weight:      Height:        Wt Readings from Last 3 Encounters:  12/24/18 95.3 kg  08/27/18 97.6 kg  01/27/18 100.8 kg     Intake/Output Summary (Last 24 hours) at 12/27/2018 1100 Last data filed at 12/26/2018 2100 Gross per 24 hour  Intake -  Output 900 ml  Net -900  ml     Physical Exam  Awake Alert, No new F.N deficits, Normal affect [place].AT,PERRAL Supple Neck,No JVD, No cervical lymphadenopathy appriciated.  Symmetrical Chest wall movement, Good air movement bilaterally, CTAB iRRR,No Gallops, Rubs or new Murmurs, No Parasternal Heave +ve B.Sounds, Abd Soft, No tenderness, No organomegaly appriciated, No rebound - guarding or rigidity. No Cyanosis, Clubbing or edema, No new Rash or bruise     Data Review:    CBC Recent Labs  Lab 12/24/18 1053 12/24/18 1134 12/25/18 0000 12/26/18 0041 12/27/18 0148  WBC 23.2* 19.7* 16.8* 23.9* 26.3*  HGB 15.3 15.6 13.8 13.5 13.8  HCT 49.0 49.2 44.1 42.9 44.9  PLT 215 227 189 224 252  MCV 84.3 83.1 83.7 83.5 84.2  MCH 26.3 26.4 26.2 26.3 25.9*  MCHC 31.2 31.7 31.3 31.5 30.7  RDW 14.4 14.2 14.4 14.4 14.4  LYMPHSABS  --  12.2* 10.4* 13.1* 15.7*  MONOABS  --  0.5 0.2 0.6 1.0  EOSABS  --  0.0 0.0 0.0 0.0  BASOSABS  --  0.0 0.0 0.1 0.1    Chemistries  Recent Labs  Lab 12/24/18 1053 12/24/18 1134 12/25/18 0000 12/26/18 0041 12/27/18 0148  NA 135 134* 134* 138 140  K 4.5 4.5 4.2 4.4 5.5*  CL 101 98 102 102 101  CO2 19* '23 22 24 27  ' GLUCOSE 107* 110* 220* 169* 113*  BUN '19 19 22 ' 35* 42*  CREATININE 0.96 1.00 0.83 0.92 1.19  CALCIUM 8.7* 8.7* 8.7* 9.0 9.1  MG  --   --  2.1 2.1 2.2  AST  --  363* 186* 118* 104*  ALT  --  507* 405* 311* 299*  ALKPHOS  --  56 47 44 45  BILITOT  --  2.0* 1.0 0.8 1.1   ------------------------------------------------------------------------------------------------------------------ Recent Labs    12/24/18 1134  TRIG 93    Lab Results  Component Value Date   HGBA1C 6.7 (H) 12/24/2018   ------------------------------------------------------------------------------------------------------------------ No results for input(s): TSH, T4TOTAL, T3FREE, THYROIDAB in the last 72 hours.  Invalid input(s): FREET3  Cardiac Enzymes No results for input(s): CKMB,  TROPONINI, MYOGLOBIN in the last 168 hours.  Invalid input(s): CK ------------------------------------------------------------------------------------------------------------------    Component Value Date/Time   BNP 249.1 (H) 10/16/2017 1435   BNP 175.5 (H) 02/15/2015 1731    Micro Results Recent Results (from the past 240 hour(s))  Blood Culture (routine x 2)     Status: None (Preliminary result)   Collection Time: 12/24/18 11:35 AM   Specimen: BLOOD  Result Value Ref Range Status   Specimen Description BLOOD RIGHT ANTECUBITAL  Final   Special Requests   Final    BOTTLES DRAWN AEROBIC AND ANAEROBIC Blood  Culture adequate volume   Culture   Final    NO GROWTH 3 DAYS Performed at [facility], [place] [address] [place], [place] [address]    Report Status PENDING  Incomplete  Blood Culture (routine x 2)     Status: None (Preliminary result)   Collection Time: 12/24/18 12:24 PM   Specimen: BLOOD  Result Value Ref Range Status   Specimen Description BLOOD LEFT ANTECUBITAL  Final   Special Requests   Final    BOTTLES DRAWN AEROBIC AND ANAEROBIC Blood Culture results may not be optimal due to an inadequate volume of blood received in culture bottles   Culture   Final    NO GROWTH 3 DAYS Performed at [facility], [place] [address], [place], [place] [address]    Report Status PENDING  Incomplete    Radiology Reports Cxr Am  Result Date: 12/26/2018 CLINICAL DATA:  Shortness of breath EXAM: PORTABLE CHEST 1 VIEW COMPARISON:  12/25/2018 FINDINGS: Signs of median sternotomy and CABG with similar appearance to the prior exam. Slight increased interstitial markings also unchanged. No signs of dense consolidation or evidence of pleural effusion. No signs of acute bone finding. IMPRESSION: Persistent increase in interstitial prominence since prior study could represent atypical infection or asymmetric edema. No consolidation or effusion. 1. Electronically Signed   By: Zetta Bills  M.D.   On: 12/26/2018 11:33   Dg Chest Port Am  Result Date: 12/25/2018 CLINICAL DATA:  Shortness of breath. EXAM: PORTABLE CHEST 1 VIEW COMPARISON:  December 24, 2018. FINDINGS: The heart size and mediastinal contours are within normal limits. No pneumothorax or pleural effusion is noted. Minimally increased diffuse interstitial densities are noted throughout both lungs concerning for atypical inflammation or infection. Status post coronary bypass graft. The visualized skeletal structures are unremarkable. IMPRESSION: Minimally increased diffuse interstitial densities are noted throughout both lungs concerning for atypical inflammation or infection. Followup radiographs are recommended until resolution. Electronically Signed   By: Marijo Conception M.D.   On: 12/25/2018 08:10   Dg Chest Portable 1 View  Result Date: 12/24/2018 CLINICAL DATA:  Cough, shortness of breath EXAM: PORTABLE CHEST 1 VIEW COMPARISON:  03/20/2017 FINDINGS: Post CABG changes. Stable mild cardiomegaly. No focal airspace consolidation. No pleural effusion or pneumothorax. IMPRESSION: No acute cardiopulmonary findings. Electronically Signed   By: Davina Poke M.D.   On: 12/24/2018 11:13

## 2018-12-28 LAB — MAGNESIUM: Magnesium: 2.2 mg/dL (ref 1.7–2.4)

## 2018-12-28 LAB — COMPREHENSIVE METABOLIC PANEL
ALT: 252 U/L — ABNORMAL HIGH (ref 0–44)
AST: 66 U/L — ABNORMAL HIGH (ref 15–41)
Albumin: 2.9 g/dL — ABNORMAL LOW (ref 3.5–5.0)
Alkaline Phosphatase: 48 U/L (ref 38–126)
Anion gap: 12 (ref 5–15)
BUN: 40 mg/dL — ABNORMAL HIGH (ref 8–23)
CO2: 28 mmol/L (ref 22–32)
Calcium: 8.8 mg/dL — ABNORMAL LOW (ref 8.9–10.3)
Chloride: 99 mmol/L (ref 98–111)
Creatinine, Ser: 0.93 mg/dL (ref 0.61–1.24)
GFR calc Af Amer: >60 mL/min (ref >60)
GFR calc non Af Amer: >60 mL/min (ref >60)
Glucose, Bld: 103 mg/dL — ABNORMAL HIGH (ref 70–99)
Potassium: 4.3 mmol/L (ref 3.5–5.1)
Sodium: 139 mmol/L (ref 135–145)
Total Bilirubin: 1 mg/dL (ref 0.3–1.2)
Total Protein: 6 g/dL — ABNORMAL LOW (ref 6.5–8.1)

## 2018-12-28 LAB — CBC WITH DIFFERENTIAL/PLATELET
Abs Immature Granulocytes: 0.13 10*3/uL — ABNORMAL HIGH (ref 0.00–0.07)
Basophils Absolute: 0.1 10*3/uL (ref 0.0–0.1)
Basophils Relative: 0 %
Eosinophils Absolute: 0 10*3/uL (ref 0.0–0.5)
Eosinophils Relative: 0 %
HCT: 42.8 % (ref 39.0–52.0)
Hemoglobin: 13.4 g/dL (ref 13.0–17.0)
Immature Granulocytes: 1 %
Lymphocytes Relative: 65 %
Lymphs Abs: 14.2 10*3/uL — ABNORMAL HIGH (ref 0.7–4.0)
MCH: 26.2 pg (ref 26.0–34.0)
MCHC: 31.3 g/dL (ref 30.0–36.0)
MCV: 83.8 fL (ref 80.0–100.0)
Monocytes Absolute: 0.8 10*3/uL (ref 0.1–1.0)
Monocytes Relative: 4 %
Neutro Abs: 6.5 10*3/uL (ref 1.7–7.7)
Neutrophils Relative %: 30 %
Platelets: 232 10*3/uL (ref 150–400)
RBC: 5.11 MIL/uL (ref 4.22–5.81)
RDW: 14.2 % (ref 11.5–15.5)
WBC: 21.7 10*3/uL — ABNORMAL HIGH (ref 4.0–10.5)
nRBC: 0 % (ref 0.0–0.2)

## 2018-12-28 LAB — C-REACTIVE PROTEIN: CRP: 6.4 mg/dL — ABNORMAL HIGH (ref <1.0)

## 2018-12-28 LAB — GLUCOSE, CAPILLARY
Glucose-Capillary: 138 mg/dL — ABNORMAL HIGH (ref 70–99)
Glucose-Capillary: 91 mg/dL (ref 70–99)
Glucose-Capillary: 136 mg/dL — ABNORMAL HIGH (ref 70–99)

## 2018-12-28 LAB — PROCALCITONIN: Procalcitonin: <0.1 ng/mL

## 2018-12-28 LAB — D-DIMER, QUANTITATIVE: D-Dimer, Quant: 1.12 ug/mL-FEU — ABNORMAL HIGH (ref 0.00–0.50)

## 2018-12-28 LAB — BRAIN NATRIURETIC PEPTIDE: B Natriuretic Peptide: 208.1 pg/mL — ABNORMAL HIGH (ref 0.0–100.0)

## 2018-12-28 MED ORDER — DIGOXIN 0.25 MG/ML IJ SOLN
0.1250 mg | Freq: Once | INTRAMUSCULAR | Status: AC
  Administered 2018-12-28: 0.125 mg via INTRAVENOUS
  Filled 2018-12-28: qty 0.5

## 2018-12-28 MED ORDER — DIGOXIN 125 MCG PO TABS
0.1250 mg | ORAL_TABLET | Freq: Every day | ORAL | Status: DC
  Administered 2018-12-29 – 2018-12-30 (×2): 0.125 mg via ORAL
  Filled 2018-12-28 (×3): qty 1

## 2018-12-28 NOTE — Progress Notes (Signed)
SATURATION QUALIFICATIONS: (This note is used to comply with regulatory documentation for home oxygen)  Patient Saturations on Room Air at Rest = 82%  Patient Saturations on 4 Liters at Rest = 96%  Patient Saturations on 4 Liters while Ambulating = 84%  Patient Saturations on 6 Liters of oxygen while Ambulating = 87%  Please briefly explain why patient needs home oxygen: Pt requiring 4-6 O2 to maintain SpO2 >84% during ADLs and mobility.  [place], OTR/L Acute Rehab Pager: [phone] Office: [phone]

## 2018-12-28 NOTE — Evaluation (Signed)
Occupational Therapy Evaluation Patient Details Name: Rodney Burns MRN: [id number] DOB: 1945-08-23 Today's Date: 12/28/2018    History of Present Illness 73 y.o. male with PMHx of chronic a-fib-s/p ablation-on Xarelto, hx of CAD-s/p CABG [id number], chronic systolic heart failure, HTN, HLD-scented with 3-4-day hx worsening SOB-he had been feeling unwell with fatigue and malaise for approximately 10 days.  In the ED, he was found to have acute hypoxic respiratory failure requiring around 3-4 L of oxygen, he was also found to have significant transaminitisdx: acute hypoxic respiratory failure due to SARS COVID-19 viral pneumonia.   Clinical Impression   PTA, pt was living with his wife and son and was independent; enjoys working in the yard. Pt currently performing ADLs and functional mobility at Supervision level. Pt presenting with decreased activity tolerance as seen by SOB and decreased SpO2. Pt dropping to low 80s on 4L during oral care and mobility. Elevated to 6L O2 and pt continued to stat between 88-82% with activity. HR elevating to 128 and RR into 30s. While seated, SpO2 97-91% on 4L and dropping to 82% on RA. Cued for purse lip breathing throughout. Pt would benefit from further acute OT to facilitate safe dc. Recommend dc to home once medically stable per physician.      Follow Up Recommendations  No OT follow up;Supervision/Assistance - 24 hour    Equipment Recommendations  None recommended by OT    Recommendations for Other Services PT consult     Precautions / Restrictions Precautions Precautions: Fall Precaution Comments: watch SpO2      Mobility Bed Mobility               General bed mobility comments: Sitting in recliner  Transfers Overall transfer level: Needs assistance Equipment used: None Transfers: Sit to/from Stand Sit to Stand: Supervision         General transfer comment: Supervision for safety    Balance Overall balance assessment: Needs  assistance Sitting-balance support: No upper extremity supported;Feet supported Sitting balance-[name]: Normal     Standing balance support: No upper extremity supported;During functional activity Standing balance-[name]: Good                             ADL either performed or assessed with clinical judgement   ADL Overall ADL's : Needs assistance/impaired Eating/Feeding: Independent;Sitting   Grooming: Oral care;Standing;Set up;Supervision/safety Grooming Details (indicate cue type and reason): Supervision for safety duing oral care. SpO2 dropping to 82% on 4L O2. cued for purse lip breathing. Elevatd to 6L and maintaing >87%. Upper Body Bathing: Set up;Supervision/ safety;Sitting   Lower Body Bathing: Sit to/from stand;Supervison/ safety   Upper Body Dressing : Set up;Supervision/safety;Sitting   Lower Body Dressing: Sit to/from stand;Supervision/safety   Toilet Transfer: Regular Toilet;Ambulation;Supervision/safety   Toileting- Clothing Manipulation and Hygiene: Supervision/safety;Sitting/lateral lean;Sit to/from stand       Functional mobility during ADLs: Supervision/safety General ADL Comments: Performing at Supervision level. SpO2 dropping to low 80s with activity on 4-6L. Cued for purse lip breathing.     Vision Baseline Vision/History: Wears glasses Patient Visual Report: No change from baseline       Perception     Praxis      Pertinent Vitals/Pain Pain Assessment: No/denies pain     Hand Dominance Right   Extremity/Trunk Assessment Upper Extremity Assessment Upper Extremity Assessment: Overall WFL for tasks assessed   Lower Extremity Assessment Lower Extremity Assessment: Overall WFL for tasks assessed  Cervical / Trunk Assessment Cervical / Trunk Assessment: Normal   Communication Communication Communication: No difficulties   Cognition Arousal/Alertness: Awake/alert Behavior During Therapy: WFL for tasks  assessed/performed Overall Cognitive Status: Within Functional Limits for tasks assessed                                 General Comments: Very motivated to participate in therapy   General Comments  O2 monitor placed on earlobe. SpO2 94% on 4L O2 at rest. During oral care and mobility SPO2 dropping to 83%; elevated to 6L O2 and SpO2 rising to 85%. Cued for purse lip breathing. Returning pt to 4L at rest and he flucuated between 94-87%. Takign O2 off to check RA and pt stating at ~87%; placed bask on 4L.[name] During activity HR elevated to 128 and RR 30s    Exercises     Shoulder Instructions      Home Living Family/patient expects to be discharged to:: Private residence Living Arrangements: Spouse/significant other;Children(Wife and son (with down syndrome)) Available Help at Discharge: Family;Available 24 hours/day Type of Home: House Home Access: Stairs to enter [employer] of Steps: 3   Home Layout: Two level;Able to live on main level with bedroom/bathroom     Bathroom Shower/Tub: Walk-in shower   Bathroom Toilet: Handicapped height     Home Equipment: Walker - 2 wheels;Grab bars - toilet;Grab bars - tub/shower;Shower seat - built in;Bedside commode          Prior Functioning/Environment Level of Independence: Independent        Comments: Enjoys working in the yard        OT Problem List: Decreased strength;Decreased range of motion;Decreased activity tolerance;Impaired balance (sitting and/or standing);Decreased knowledge of use of DME or AE;Decreased knowledge of precautions      OT Treatment/Interventions: Self-care/ADL training;Therapeutic exercise;Energy conservation;DME and/or AE instruction;Therapeutic activities;Patient/family education    OT Goals(Current goals can be found in the care plan section) Acute Rehab OT Goals Patient Stated Goal: Go home soon OT Goal Formulation: With patient Time For Goal Achievement:  01/11/19 Potential to Achieve Goals: Good  OT Frequency: Min 3X/week   Barriers to D/C:            Co-evaluation              AM-PAC OT "6 Clicks" Daily Activity     Outcome Measure Help from another person eating meals?: None Help from another person taking care of personal grooming?: None Help from another person toileting, which includes using toliet, bedpan, or urinal?: None Help from another person bathing (including washing, rinsing, drying)?: None Help from another person to put on and taking off regular upper body clothing?: None Help from another person to put on and taking off regular lower body clothing?: None 6 Click Score: 24   End of Session Equipment Utilized During Treatment: Oxygen(4-6L) Nurse Communication: Mobility status  Activity Tolerance: Patient tolerated treatment well Patient left: in chair;with call bell/phone within reach  OT Visit Diagnosis: Unsteadiness on feet (R26.81);Other abnormalities of gait and mobility (R26.89);Muscle weakness (generalized) (M62.81)                Time: [id number] OT Time Calculation (min): 32 min Charges:  OT General Charges $OT Visit: 1 Visit OT Evaluation $OT Eval Moderate Complexity: 1 Mod OT Treatments $Self Care/Home Management : 8-22 mins  [place], OTR/L Acute Rehab Pager: [phone] Office: [place] M  Izik Bingman 12/28/2018, 8:37 AM

## 2018-12-28 NOTE — Progress Notes (Signed)
PROGRESS NOTE                                                                                                                                                                                                             Patient Demographics:    Rodney Burns, is a 73 y.o. male, DOB - 1945/07/01, YQI:[id number]  Outpatient Primary MD for the patient is Hulan Fess, MD    LOS - 4  Admit date - 12/24/2018    Chief Complaint  Patient presents with  . Covid/SOB  . r/o covid       Brief Narrative - Patient is a 74 y.o. male with PMHx of chronic atrial fibrillation-status post ablation-on Xarelto, history of CAD-s/p CABG 3875, chronic systolic heart failure, HTN, HLD-scented with 3-4-day history of worsening shortness of breath-he had been feeling unwell with fatigue and malaise for approximately 10 days.  In the ED, he was found to have acute hypoxic respiratory failure requiring around 3-4 L of oxygen, he was also found to have significant transaminitis.  He was transferred to [facility] for further evaluation and treatment.   Subjective:   Patient in bed, appears comfortable, denies any headache, no fever, no chest pain or pressure, no shortness of breath , no abdominal pain. No focal weakness.   Assessment  & Plan :     1. Acute Hypoxic Resp. Failure due to Acute Covid 19 Viral Pneumonitis during the ongoing 2020 Covid 19 Pandemic - he had severe disease, currently on 4 L nasal cannula oxygen, when he moves he requires higher flow, at rest he is feeling good, he has been treated with steroids and convalescent plasma.  Due to severe transaminitis he could not get remdesivir or Actemra.  Continue to monitor closely.  Encouraged him to sit up in chair in the daytime use I-S and flutter valve for pulmonary toiletry and then prone in bed when at night.  COVID-19 Medications:  Steroids:12/3>> Remdesivir: Not given due to ALT> 250 Actemra:  Will avoid given significant ALT/AST elevation Convalescent Plasma: x 1 on 12/3  SpO2: 95 % O2 Flow Rate (L/min): 3 L/min  Recent Labs  Lab 12/24/18 1134 12/24/18 1305 12/25/18 0000 12/26/18 0041 12/27/18 0148 12/28/18 0344  CRP 7.3*  --  7.6* 3.9* 1.5* 6.4*  DDIMER 2.12*  --  1.84* 1.02* 0.89* 1.12*  FERRITIN 510* 490* 367* 290  --   --   BNP  --   --   --   --  222.5* 208.1*  PROCALCITON <0.10 <0.10  --  <0.10 <0.10 <0.10    Hepatic Function Latest Ref Rng & Units 12/28/2018 12/27/2018 12/26/2018  Total Protein 6.5 - 8.1 g/dL 6.0(L) 6.2(L) 6.1(L)  Albumin 3.5 - 5.0 g/dL 2.9(L) 3.1(L) 2.9(L)  AST 15 - 41 U/L 66(H) 104(H) 118(H)  ALT 0 - 44 U/L 252(H) 299(H) 311(H)  Alk Phosphatase 38 - 126 U/L 48 45 44  Total Bilirubin 0.3 - 1.2 mg/dL 1.0 1.1 0.8     2.  Transaminitis.  Due to COVID-19 infection.  Trend is improving and he is symptom-free monitor.  3.  Chronic systolic heart failure with EF 45% on echocardiogram done 10/21/2017.  Appears euvolemic.  Stopped Lasix on 12/27/2018.  4.  Chronic atrial fibrillation.  In RVR, have increased Toprol-XL dose, IV digoxin loading dose given again on 12/28/2018, will place him on oral digoxin from 12/29/2018, rate control improved.  5.  CAD.  Stable no acute issues on beta-blocker, statin and Xarelto for secondary prevention.  6.  GERD.  On PPI.  7.  Leukocytosis.  Likely steroid-induced.  Afebrile, stable procalcitonin, stable repeat chest x-ray.   8.  Hyperkalemia.  Resolved after Lasix and Kayexalate.    Condition - Fair    Family Communication  :  Spouse/Daughter updated over the phone - by previous MD, wife Rosco Harriott on 12/27/2018. Wife 12/7  Code Status :  Full  Diet :   Diet Order            Diet Heart Room service appropriate? Yes; Fluid consistency: Thin  Diet effective now               Disposition Plan  :  Home in 1 to 2 days  Consults  :  None  Procedures  :    PUD Prophylaxis : PPI  DVT  Prophylaxis  :  Xaralto  Lab Results  Component Value Date   PLT 232 12/28/2018    Inpatient Medications  Scheduled Meds: . digoxin  0.125 mg Intravenous Once  . [START ON 12/29/2018] digoxin  0.125 mg Oral Daily  . folic acid  1 mg Oral Daily  . insulin aspart  0-9 Units Subcutaneous TID WC  . Ipratropium-Albuterol  1 puff Inhalation Q6H  . latanoprost  1 drop Both Eyes QHS  . magnesium oxide  400 mg Oral QHS  . methylPREDNISolone (SOLU-MEDROL) injection  40 mg Intravenous Daily  . metoprolol succinate  50 mg Oral Daily  . pantoprazole  40 mg Oral Daily  . pramipexole  1.5 mg Oral Daily  . rivaroxaban  20 mg Oral Q supper  . sodium chloride flush  3 mL Intravenous Q12H  . thiamine  100 mg Oral Daily   Continuous Infusions: PRN Meds:.acetaminophen, guaiFENesin-dextromethorphan, [DISCONTINUED] ondansetron **OR** ondansetron (ZOFRAN) IV, traMADol  Antibiotics  :    Anti-infectives (From admission, onward)   Start     Dose/Rate Route Frequency Ordered Stop   12/24/18 1400  levofloxacin (LEVAQUIN) IVPB 750 mg  Status:  Discontinued     750 mg 100 mL/hr over 90 Minutes Intravenous Every 24 hours 12/24/18 1305 12/24/18 1712       Time Spent in minutes  30   Lala Lund M.D on 12/28/2018 at 11:38 AM  To page go to [email or web address] - password [email or web address]  [facility] -  Office  [phone]  See all Orders from today for further details    Objective:   Vitals:   12/28/18 0453 12/28/18 0454 12/28/18 0750 12/28/18 0904  BP:   134/77   Pulse: 71 81 85 (!) 105  Resp: _0 Temp:   (!) 97.5 F (36.4 C)   TempSrc:   Axillary   SpO2: (!) 88% 93% 90% 95%  Weight:      Height:        Wt Readings from Last 3 Encounters:  12/24/18 95.3 kg  08/27/18 97.6 kg  01/27/18 100.8 kg     Intake/Output Summary (Last 24 hours) at 12/28/2018 1138 Last data filed at 12/28/2018 0445 Gross per 24 hour  Intake 123 ml  Output 825 ml  Net -702 ml     Physical Exam   Awake Alert,   No new F.N deficits, Normal affect [place].AT,PERRAL Supple Neck,No JVD, No cervical lymphadenopathy appriciated.  Symmetrical Chest wall movement, Good air movement bilaterally, CTAB iRRR,No Gallops, Rubs or new Murmurs, No Parasternal Heave +ve B.Sounds, Abd Soft, No tenderness, No organomegaly appriciated, No rebound - guarding or rigidity. No Cyanosis, Clubbing or edema, No new Rash or bruise    Data Review:    CBC Recent Labs  Lab 12/24/18 1134 12/25/18 0000 12/26/18 0041 12/27/18 0148 12/28/18 0344  WBC 19.7* 16.8* 23.9* 26.3* 21.7*  HGB 15.6 13.8 13.5 13.8 13.4  HCT 49.2 44.1 42.9 44.9 42.8  PLT 227 189 224 252 232  MCV 83.1 83.7 83.5 84.2 83.8  MCH 26.4 26.2 26.3 25.9* 26.2  MCHC 31.7 31.3 31.5 30.7 31.3  RDW 14.2 14.4 14.4 14.4 14.2  LYMPHSABS 12.2* 10.4* 13.1* 15.7* 14.2*  MONOABS 0.5 0.2 0.6 1.0 0.8  EOSABS 0.0 0.0 0.0 0.0 0.0  BASOSABS 0.0 0.0 0.1 0.1 0.1    Chemistries  Recent Labs  Lab 12/24/18 1134 12/25/18 0000 12/26/18 0041 12/27/18 0148 12/28/18 0344  NA 134* 134* 138 140 139  K 4.5 4.2 4.4 5.5* 4.3  CL 98 102 102 101 99  CO2 _1 GLUCOSE 110* 220* 169* 113* 103*  BUN 19 22 35* 42* 40*  CREATININE 1.00 0.83 0.92 1.19 0.93  CALCIUM 8.7* 8.7* 9.0 9.1 8.8*  MG  --  2.1 2.1 2.2 2.2  AST 363* 186* 118* 104* 66*  ALT 507* 405* 311* 299* 252*  ALKPHOS 56 47 44 45 48  BILITOT 2.0* 1.0 0.8 1.1 1.0   ------------------------------------------------------------------------------------------------------------------ No results for input(s): CHOL, HDL, LDLCALC, TRIG, CHOLHDL, LDLDIRECT in the last 72 hours.  Lab Results  Component Value Date   HGBA1C 6.7 (H) 12/24/2018   ------------------------------------------------------------------------------------------------------------------ No results for input(s): TSH, T4TOTAL, T3FREE, THYROIDAB in the last 72 hours.  Invalid input(s): FREET3  Cardiac Enzymes No results for  input(s): CKMB, TROPONINI, MYOGLOBIN in the last 168 hours.  Invalid input(s): CK ------------------------------------------------------------------------------------------------------------------    Component Value Date/Time   BNP 208.1 (H) 12/28/2018 0344    Micro Results Recent Results (from the past 240 hour(s))  Blood Culture (routine x 2)     Status: None (Preliminary result)   Collection Time: 12/24/18 11:35 AM   Specimen: BLOOD  Result Value Ref Range Status   Specimen Description BLOOD RIGHT ANTECUBITAL  Final   Special Requests   Final    BOTTLES DRAWN AEROBIC AND ANAEROBIC Blood Culture adequate volume   Culture   Final    NO GROWTH 3 DAYS Performed at [facility], [place] [address], [place], [place] [address]    Report Status PENDING  Incomplete  Blood Culture (routine x 2)     Status: None (Preliminary result)   Collection Time: 12/24/18 12:24 PM   Specimen: BLOOD  Result Value Ref Range Status   Specimen Description BLOOD LEFT ANTECUBITAL  Final   Special Requests   Final    BOTTLES DRAWN AEROBIC AND ANAEROBIC Blood Culture results may not be optimal due to an inadequate volume of blood received in culture bottles   Culture   Final    NO GROWTH 3 DAYS Performed at [facility], [place] [address], [place], [place] [address]    Report Status PENDING  Incomplete    Radiology Reports Cxr Am  Result Date: 12/26/2018 CLINICAL DATA:  Shortness of breath EXAM: PORTABLE CHEST 1 VIEW COMPARISON:  12/25/2018 FINDINGS: Signs of median sternotomy and CABG with similar appearance to the prior exam. Slight increased interstitial markings also unchanged. No signs of dense consolidation or evidence of pleural effusion. No signs of acute bone finding. IMPRESSION: Persistent increase in interstitial prominence since prior study could represent atypical infection or asymmetric edema. No consolidation or effusion. 1. Electronically Signed   By: Zetta Bills M.D.   On:  12/26/2018 11:33   Dg Chest Port Am  Result Date: 12/25/2018 CLINICAL DATA:  Shortness of breath. EXAM: PORTABLE CHEST 1 VIEW COMPARISON:  December 24, 2018. FINDINGS: The heart size and mediastinal contours are within normal limits. No pneumothorax or pleural effusion is noted. Minimally increased diffuse interstitial densities are noted throughout both lungs concerning for atypical inflammation or infection. Status post coronary bypass graft. The visualized skeletal structures are unremarkable. IMPRESSION: Minimally increased diffuse interstitial densities are noted throughout both lungs concerning for atypical inflammation or infection. Followup radiographs are recommended until resolution. Electronically Signed   By: Marijo Conception M.D.   On: 12/25/2018 08:10   Dg Chest Portable 1 View  Result Date: 12/24/2018 CLINICAL DATA:  Cough, shortness of breath EXAM: PORTABLE CHEST 1 VIEW COMPARISON:  03/20/2017 FINDINGS: Post CABG changes. Stable mild cardiomegaly. No focal airspace consolidation. No pleural effusion or pneumothorax. IMPRESSION: No acute cardiopulmonary findings. Electronically Signed   By: Davina Poke M.D.   On: 12/24/2018 11:13

## 2018-12-28 NOTE — Progress Notes (Signed)
Pt walked in hallways, 3 laps around nurses station, 6L O2 at 89. At rest now in chair 92 in chair.

## 2018-12-29 LAB — COMPREHENSIVE METABOLIC PANEL
ALT: 231 U/L — ABNORMAL HIGH (ref 0–44)
AST: 68 U/L — ABNORMAL HIGH (ref 15–41)
Albumin: 3.1 g/dL — ABNORMAL LOW (ref 3.5–5.0)
Alkaline Phosphatase: 46 U/L (ref 38–126)
Anion gap: 11 (ref 5–15)
BUN: 33 mg/dL — ABNORMAL HIGH (ref 8–23)
CO2: 27 mmol/L (ref 22–32)
Calcium: 8.8 mg/dL — ABNORMAL LOW (ref 8.9–10.3)
Chloride: 103 mmol/L (ref 98–111)
Creatinine, Ser: 0.87 mg/dL (ref 0.61–1.24)
GFR calc Af Amer: >60 mL/min (ref >60)
GFR calc non Af Amer: >60 mL/min (ref >60)
Glucose, Bld: 92 mg/dL (ref 70–99)
Potassium: 4.9 mmol/L (ref 3.5–5.1)
Sodium: 141 mmol/L (ref 135–145)
Total Bilirubin: 1.6 mg/dL — ABNORMAL HIGH (ref 0.3–1.2)
Total Protein: 6.2 g/dL — ABNORMAL LOW (ref 6.5–8.1)

## 2018-12-29 LAB — CBC WITH DIFFERENTIAL/PLATELET
Abs Immature Granulocytes: 0.15 10*3/uL — ABNORMAL HIGH (ref 0.00–0.07)
Basophils Absolute: 0.1 10*3/uL (ref 0.0–0.1)
Basophils Relative: 0 %
Eosinophils Absolute: 0 10*3/uL (ref 0.0–0.5)
Eosinophils Relative: 0 %
HCT: 44.4 % (ref 39.0–52.0)
Hemoglobin: 13.6 g/dL (ref 13.0–17.0)
Immature Granulocytes: 1 %
Lymphocytes Relative: 68 %
Lymphs Abs: 18.5 10*3/uL — ABNORMAL HIGH (ref 0.7–4.0)
MCH: 26.1 pg (ref 26.0–34.0)
MCHC: 30.6 g/dL (ref 30.0–36.0)
MCV: 85.1 fL (ref 80.0–100.0)
Monocytes Absolute: 0.9 10*3/uL (ref 0.1–1.0)
Monocytes Relative: 3 %
Neutro Abs: 7.6 10*3/uL (ref 1.7–7.7)
Neutrophils Relative %: 28 %
Platelets: 254 10*3/uL (ref 150–400)
RBC: 5.22 MIL/uL (ref 4.22–5.81)
RDW: 14.2 % (ref 11.5–15.5)
WBC: 27.2 10*3/uL — ABNORMAL HIGH (ref 4.0–10.5)
nRBC: 0 % (ref 0.0–0.2)

## 2018-12-29 LAB — CULTURE, BLOOD (ROUTINE X 2)
Culture: NO GROWTH
Culture: NO GROWTH
Special Requests: ADEQUATE

## 2018-12-29 LAB — GLUCOSE, CAPILLARY
Glucose-Capillary: 183 mg/dL — ABNORMAL HIGH (ref 70–99)
Glucose-Capillary: 160 mg/dL — ABNORMAL HIGH (ref 70–99)
Glucose-Capillary: 76 mg/dL (ref 70–99)
Glucose-Capillary: 119 mg/dL — ABNORMAL HIGH (ref 70–99)
Glucose-Capillary: 170 mg/dL — ABNORMAL HIGH (ref 70–99)

## 2018-12-29 LAB — BRAIN NATRIURETIC PEPTIDE: B Natriuretic Peptide: 208.8 pg/mL — ABNORMAL HIGH (ref 0.0–100.0)

## 2018-12-29 LAB — D-DIMER, QUANTITATIVE: D-Dimer, Quant: 1.23 ug/mL-FEU — ABNORMAL HIGH (ref 0.00–0.50)

## 2018-12-29 LAB — C-REACTIVE PROTEIN: CRP: 4.7 mg/dL — ABNORMAL HIGH (ref <1.0)

## 2018-12-29 LAB — MAGNESIUM: Magnesium: 2.4 mg/dL (ref 1.7–2.4)

## 2018-12-29 MED ORDER — METHYLPREDNISOLONE SODIUM SUCC 40 MG IJ SOLR
20.0000 mg | Freq: Every day | INTRAMUSCULAR | Status: DC
  Administered 2018-12-29: 20 mg via INTRAVENOUS
  Filled 2018-12-29: qty 1

## 2018-12-29 NOTE — Progress Notes (Signed)
PROGRESS NOTE                                                                                                                                                                                                             Patient Demographics:    Rodney Burns, is a 73 y.o. male, DOB - Mar 28, 1945, PRX:[id number]  Outpatient Primary MD for the patient is Hulan Fess, MD    LOS - 5  Admit date - 12/24/2018    Chief Complaint  Patient presents with  . Covid/SOB  . r/o covid       Brief Narrative - Patient is a 73 y.o. male with PMHx of chronic atrial fibrillation-status post ablation-on Xarelto, history of CAD-s/p CABG 4628, chronic systolic heart failure, HTN, HLD-scented with 3-4-day history of worsening shortness of breath-he had been feeling unwell with fatigue and malaise for approximately 10 days.  In the ED, he was found to have acute hypoxic respiratory failure requiring around 3-4 L of oxygen, he was also found to have significant transaminitis.  He was transferred to [facility] for further evaluation and treatment.   Subjective:   Patient in bed, appears comfortable, denies any headache, no fever, no chest pain or pressure, no shortness of breath , no abdominal pain. No focal weakness.    Assessment  & Plan :     1. Acute Hypoxic Resp. Failure due to Acute Covid 19 Viral Pneumonitis during the ongoing 2020 Covid 19 Pandemic - he had severe disease, currently on 4 L nasal cannula oxygen, when he moves he requires higher flow, at rest he is feeling good, he has been treated with steroids and convalescent plasma.  Due to severe transaminitis he could not get remdesivir or Actemra.  Continue to monitor closely.  Encouraged him to sit up in chair in the daytime use I-S and flutter valve for pulmonary toiletry and then prone in bed when at night.  COVID-19 Medications:  Steroids:12/3>> Remdesivir: Not given due to ALT> 250  Actemra: Will avoid given significant ALT/AST elevation Convalescent Plasma: x 1 on 12/3  SpO2: 93 % O2 Flow Rate (L/min): 1 L/min  Recent Labs  Lab 12/24/18 1134 12/24/18 1305 12/25/18 0000 12/26/18 0041 12/27/18 0148 12/28/18 0344 12/29/18 0450  CRP 7.3*  --  7.6* 3.9* 1.5* 6.4* 4.7*  DDIMER 2.12*  --  1.84*  1.02* 0.89* 1.12* 1.23*  FERRITIN 510* 490* 367* 290  --   --   --   BNP  --   --   --   --  222.5* 208.1* 208.8*  PROCALCITON <0.10 <0.10  --  <0.10 <0.10 <0.10  --     Hepatic Function Latest Ref Rng & Units 12/29/2018 12/28/2018 12/27/2018  Total Protein 6.5 - 8.1 g/dL 6.2(L) 6.0(L) 6.2(L)  Albumin 3.5 - 5.0 g/dL 3.1(L) 2.9(L) 3.1(L)  AST 15 - 41 U/L 68(H) 66(H) 104(H)  ALT 0 - 44 U/L 231(H) 252(H) 299(H)  Alk Phosphatase 38 - 126 U/L 46 48 45  Total Bilirubin 0.3 - 1.2 mg/dL 1.6(H) 1.0 1.1     2.  Transaminitis.  Due to COVID-19 infection.  Trend is improving and he is symptom-free monitor.  3.  Chronic systolic heart failure with EF 45% on echocardiogram done 10/21/2017.  Compensated.  Continue beta-blocker.  4.  Chronic atrial fibrillation.  In RVR, have increased Toprol-XL dose, IV digoxin loading dose given again on 12/28/2018, now has been placed on oral digoxin on which she will be discharged, rate control improved.  5.  CAD.  Stable no acute issues on beta-blocker, statin and Xarelto for secondary prevention.  6.  GERD.  On PPI.  7.  Leukocytosis.  Likely steroid-induced.  Afebrile, stable procalcitonin, stable repeat chest x-ray.   8.  Hyperkalemia.  Resolved after Lasix and Kayexalate.    Condition - Fair    Family Communication  :  Spouse/Daughter updated over the phone - by previous MD, wife Athony Coppa on 12/27/2018. Wife 12/7  Code Status :  Full  Diet :   Diet Order            Diet Heart Room service appropriate? Yes; Fluid consistency: Thin  Diet effective now               Disposition Plan  :  Home in 1 to 2 days  Consults  :   None  Procedures  :    PUD Prophylaxis : PPI  DVT Prophylaxis  :  Xaralto  Lab Results  Component Value Date   PLT 254 12/29/2018    Inpatient Medications  Scheduled Meds: . digoxin  0.125 mg Oral Daily  . folic acid  1 mg Oral Daily  . insulin aspart  0-9 Units Subcutaneous TID WC  . Ipratropium-Albuterol  1 puff Inhalation Q6H  . latanoprost  1 drop Both Eyes QHS  . magnesium oxide  400 mg Oral QHS  . methylPREDNISolone (SOLU-MEDROL) injection  20 mg Intravenous Daily  . metoprolol succinate  50 mg Oral Daily  . pantoprazole  40 mg Oral Daily  . pramipexole  1.5 mg Oral Daily  . rivaroxaban  20 mg Oral Q supper  . sodium chloride flush  3 mL Intravenous Q12H  . thiamine  100 mg Oral Daily   Continuous Infusions: PRN Meds:.acetaminophen, guaiFENesin-dextromethorphan, [DISCONTINUED] ondansetron **OR** ondansetron (ZOFRAN) IV, traMADol  Antibiotics  :    Anti-infectives (From admission, onward)   Start     Dose/Rate Route Frequency Ordered Stop   12/24/18 1400  levofloxacin (LEVAQUIN) IVPB 750 mg  Status:  Discontinued     750 mg 100 mL/hr over 90 Minutes Intravenous Every 24 hours 12/24/18 1305 12/24/18 1712       Time Spent in minutes  30   Lala Lund M.D on 12/29/2018 at 10:44 AM  To page go to [email or web address] - password [email or web address]  [facility] -  Office  [phone]  See all Orders from today for further details    Objective:   Vitals:   12/28/18 2100 12/28/18 2200 12/29/18 0400 12/29/18 0811  BP:   (!) 143/82 139/74  Pulse: (!) 50 99  90  Resp: (!) 26 18  (!) 22  Temp:   98.4 F (36.9 C) (!) 97.3 F (36.3 C)  TempSrc:   Oral Oral  SpO2: 90% 94% 90% 93%  Weight:      Height:        Wt Readings from Last 3 Encounters:  12/24/18 95.3 kg  08/27/18 97.6 kg  01/27/18 100.8 kg     Intake/Output Summary (Last 24 hours) at 12/29/2018 1044 Last data filed at 12/28/2018 2034 Gross per 24 hour  Intake 366 ml  Output -  Net 366 ml      Physical Exam  Awake Alert,   No new F.N deficits, Normal affect [place].AT,PERRAL Supple Neck,No JVD, No cervical lymphadenopathy appriciated.  Symmetrical Chest wall movement, Good air movement bilaterally, CTAB iRRR,No Gallops, Rubs or new Murmurs, No Parasternal Heave +ve B.Sounds, Abd Soft, No tenderness, No organomegaly appriciated, No rebound - guarding or rigidity. No Cyanosis, Clubbing or edema, No new Rash or bruise    Data Review:    CBC Recent Labs  Lab 12/25/18 0000 12/26/18 0041 12/27/18 0148 12/28/18 0344 12/29/18 0450  WBC 16.8* 23.9* 26.3* 21.7* 27.2*  HGB 13.8 13.5 13.8 13.4 13.6  HCT 44.1 42.9 44.9 42.8 44.4  PLT 189 224 252 232 254  MCV 83.7 83.5 84.2 83.8 85.1  MCH 26.2 26.3 25.9* 26.2 26.1  MCHC 31.3 31.5 30.7 31.3 30.6  RDW 14.4 14.4 14.4 14.2 14.2  LYMPHSABS 10.4* 13.1* 15.7* 14.2* 18.5*  MONOABS 0.2 0.6 1.0 0.8 0.9  EOSABS 0.0 0.0 0.0 0.0 0.0  BASOSABS 0.0 0.1 0.1 0.1 0.1    Chemistries  Recent Labs  Lab 12/25/18 0000 12/26/18 0041 12/27/18 0148 12/28/18 0344 12/29/18 0450  NA 134* 138 140 139 141  K 4.2 4.4 5.5* 4.3 4.9  CL 102 102 101 99 103  CO2 '22 24 27 28 27  ' GLUCOSE 220* 169* 113* 103* 92  BUN 22 35* 42* 40* 33*  CREATININE 0.83 0.92 1.19 0.93 0.87  CALCIUM 8.7* 9.0 9.1 8.8* 8.8*  MG 2.1 2.1 2.2 2.2 2.4  AST 186* 118* 104* 66* 68*  ALT 405* 311* 299* 252* 231*  ALKPHOS 47 44 45 48 46  BILITOT 1.0 0.8 1.1 1.0 1.6*   ------------------------------------------------------------------------------------------------------------------ No results for input(s): CHOL, HDL, LDLCALC, TRIG, CHOLHDL, LDLDIRECT in the last 72 hours.  Lab Results  Component Value Date   HGBA1C 6.7 (H) 12/24/2018   ------------------------------------------------------------------------------------------------------------------ No results for input(s): TSH, T4TOTAL, T3FREE, THYROIDAB in the last 72 hours.  Invalid input(s): FREET3  Cardiac Enzymes No  results for input(s): CKMB, TROPONINI, MYOGLOBIN in the last 168 hours.  Invalid input(s): CK ------------------------------------------------------------------------------------------------------------------    Component Value Date/Time   BNP 208.8 (H) 12/29/2018 0450    Micro Results Recent Results (from the past 240 hour(s))  Blood Culture (routine x 2)     Status: None   Collection Time: 12/24/18 11:35 AM   Specimen: BLOOD  Result Value Ref Range Status   Specimen Description BLOOD RIGHT ANTECUBITAL  Final   Special Requests   Final    BOTTLES DRAWN AEROBIC AND ANAEROBIC Blood Culture adequate volume   Culture   Final    NO GROWTH 5 DAYS Performed  at [facility], [place] [address], [place], [place] [address]    Report Status 12/29/2018 FINAL  Final  Blood Culture (routine x 2)     Status: None   Collection Time: 12/24/18 12:24 PM   Specimen: BLOOD  Result Value Ref Range Status   Specimen Description BLOOD LEFT ANTECUBITAL  Final   Special Requests   Final    BOTTLES DRAWN AEROBIC AND ANAEROBIC Blood Culture results may not be optimal due to an inadequate volume of blood received in culture bottles   Culture   Final    NO GROWTH 5 DAYS Performed at [facility], [place] [address] [place], [place] [address]    Report Status 12/29/2018 FINAL  Final    Radiology Reports Cxr Am  Result Date: 12/26/2018 CLINICAL DATA:  Shortness of breath EXAM: PORTABLE CHEST 1 VIEW COMPARISON:  12/25/2018 FINDINGS: Signs of median sternotomy and CABG with similar appearance to the prior exam. Slight increased interstitial markings also unchanged. No signs of dense consolidation or evidence of pleural effusion. No signs of acute bone finding. IMPRESSION: Persistent increase in interstitial prominence since prior study could represent atypical infection or asymmetric edema. No consolidation or effusion. 1. Electronically Signed   By: Zetta Bills M.D.   On: 12/26/2018 11:33   Dg  Chest Port Am  Result Date: 12/25/2018 CLINICAL DATA:  Shortness of breath. EXAM: PORTABLE CHEST 1 VIEW COMPARISON:  December 24, 2018. FINDINGS: The heart size and mediastinal contours are within normal limits. No pneumothorax or pleural effusion is noted. Minimally increased diffuse interstitial densities are noted throughout both lungs concerning for atypical inflammation or infection. Status post coronary bypass graft. The visualized skeletal structures are unremarkable. IMPRESSION: Minimally increased diffuse interstitial densities are noted throughout both lungs concerning for atypical inflammation or infection. Followup radiographs are recommended until resolution. Electronically Signed   By: Marijo Conception M.D.   On: 12/25/2018 08:10   Dg Chest Portable 1 View  Result Date: 12/24/2018 CLINICAL DATA:  Cough, shortness of breath EXAM: PORTABLE CHEST 1 VIEW COMPARISON:  03/20/2017 FINDINGS: Post CABG changes. Stable mild cardiomegaly. No focal airspace consolidation. No pleural effusion or pneumothorax. IMPRESSION: No acute cardiopulmonary findings. Electronically Signed   By: Davina Poke M.D.   On: 12/24/2018 11:13

## 2018-12-29 NOTE — Plan of Care (Signed)

## 2018-12-29 NOTE — Progress Notes (Signed)
Physical Therapy Treatment Patient Details Name: Rodney Burns MRN: [id number] DOB: 1945/04/22 Today's Date: 12/29/2018    History of Present Illness 73 y.o. male with PMHx of chronic a-fib-s/p ablation-on Xarelto, hx of CAD-s/p CABG Q000111Q, chronic systolic heart failure, HTN, HLD-scented with 3-4-day hx worsening SOB-he had been feeling unwell with fatigue and malaise for approximately 10 days.  In the ED, he was found to have acute hypoxic respiratory failure requiring around 3-4 L of oxygen, he was also found to have significant transaminitis dx: acute hypoxic respiratory failure due to SARS COVID-19 viral pneumonia.   PT Comments    Pt progressing well with mobility. Ambulating independently. SpO2 down to 80s on RA while walking, difficulty getting reliable pulse ox reading to determine true supplemental O2 needs this session. SpO2 quickly returns to 88-96% on RA with seated rest. Pt preparing for d/c tomorrow. Reinforced educ re: activity recommendations, use of pulse ox, energy conservation.    Follow Up Recommendations  No PT follow up     Equipment Recommendations  None recommended by PT    Recommendations for Other Services       Precautions / Restrictions Precautions Precautions: None Restrictions Weight Bearing Restrictions: No    Mobility  Bed Mobility               General bed mobility comments: Sitting in recliner  Transfers Overall transfer level: Independent Equipment used: None                Ambulation/Gait Ambulation/Gait assistance: Independent Gait Distance (Feet): 500 Feet Assistive device: None Gait Pattern/deviations: Step-through pattern;Decreased stride length   Gait velocity interpretation: 1.31 - 2.62 ft/sec, indicative of limited community ambulator General Gait Details: Able to ambulate 2x 4-min bouts of walking independent. DOE 2-3/4; SpO2 down to mid-80s on RA but unreliable pleth via ear probe. Switched to [employer] ear monitor  which read even lower and said poor signal. SpO2 returning to >90% on RA with seated rest break   Stairs             Wheelchair Mobility    Modified Rankin (Stroke Patients Only)       Balance Overall balance assessment: No apparent balance deficits (not formally assessed)                                          Cognition Arousal/Alertness: Awake/alert Behavior During Therapy: WFL for tasks assessed/performed Overall Cognitive Status: Within Functional Limits for tasks assessed                                        Exercises      General Comments General comments (skin integrity, edema, etc.): Long story short- unable to get reliable pulse ox reading when pt ambulating; SpO2 down to 80s on RA while walking with unreliable pleth, quickly returning to 88-96% with seated seated rest on RA. Pt preparing to d/c home tomorrow with portable O2; has pulse ox monitor at home, educ on importance of checking daily and with activity; educ on activity recommendations and energy conserv. strategies (handout provided)      Pertinent Vitals/Pain Pain Assessment: No/denies pain    Home Living  Prior Function            PT Goals (current goals can now be found in the care plan section) Progress towards PT goals: Progressing toward goals    Frequency    Min 3X/week      PT Plan Current plan remains appropriate    Co-evaluation              AM-PAC PT "6 Clicks" Mobility   Outcome Measure  Help needed turning from your back to your side while in a flat bed without using bedrails?: None Help needed moving from lying on your back to sitting on the side of a flat bed without using bedrails?: None Help needed moving to and from a bed to a chair (including a wheelchair)?: None Help needed standing up from a chair using your arms (e.g., wheelchair or bedside chair)?: None Help needed to walk in hospital  room?: None Help needed climbing 3-5 steps with a railing? : None 6 Click Score: 24    End of Session   Activity Tolerance: Patient tolerated treatment well Patient left: in chair;with call bell/phone within reach Nurse Communication: Mobility status PT Visit Diagnosis: Other abnormalities of gait and mobility (R26.89)     Time: [id number] PT Time Calculation (min) (ACUTE ONLY): 30 min  Charges:  $Therapeutic Exercise: 23-37 mins                    Mabeline Caras, PT, DPT Acute Rehabilitation Services  Pager [phone] Office [place] 12/29/2018, 11:28 AM

## 2018-12-30 LAB — COMPREHENSIVE METABOLIC PANEL
ALT: 216 U/L — ABNORMAL HIGH (ref 0–44)
AST: 69 U/L — ABNORMAL HIGH (ref 15–41)
Albumin: 3 g/dL — ABNORMAL LOW (ref 3.5–5.0)
Alkaline Phosphatase: 45 U/L (ref 38–126)
Anion gap: 9 (ref 5–15)
BUN: 30 mg/dL — ABNORMAL HIGH (ref 8–23)
CO2: 26 mmol/L (ref 22–32)
Calcium: 8.5 mg/dL — ABNORMAL LOW (ref 8.9–10.3)
Chloride: 101 mmol/L (ref 98–111)
Creatinine, Ser: 0.87 mg/dL (ref 0.61–1.24)
GFR calc Af Amer: >60 mL/min (ref >60)
GFR calc non Af Amer: >60 mL/min (ref >60)
Glucose, Bld: 81 mg/dL (ref 70–99)
Potassium: 4.9 mmol/L (ref 3.5–5.1)
Sodium: 136 mmol/L (ref 135–145)
Total Bilirubin: 1.3 mg/dL — ABNORMAL HIGH (ref 0.3–1.2)
Total Protein: 6.1 g/dL — ABNORMAL LOW (ref 6.5–8.1)

## 2018-12-30 LAB — GLUCOSE, CAPILLARY
Glucose-Capillary: 73 mg/dL (ref 70–99)
Glucose-Capillary: 129 mg/dL — ABNORMAL HIGH (ref 70–99)
Glucose-Capillary: 154 mg/dL — ABNORMAL HIGH (ref 70–99)
Glucose-Capillary: 114 mg/dL — ABNORMAL HIGH (ref 70–99)

## 2018-12-30 LAB — CBC WITH DIFFERENTIAL/PLATELET
Abs Immature Granulocytes: 0.17 10*3/uL — ABNORMAL HIGH (ref 0.00–0.07)
Basophils Absolute: 0.1 10*3/uL (ref 0.0–0.1)
Basophils Relative: 0 %
Eosinophils Absolute: 0.1 10*3/uL (ref 0.0–0.5)
Eosinophils Relative: 0 %
HCT: 44 % (ref 39.0–52.0)
Hemoglobin: 13.5 g/dL (ref 13.0–17.0)
Immature Granulocytes: 1 %
Lymphocytes Relative: 70 %
Lymphs Abs: 21.1 10*3/uL — ABNORMAL HIGH (ref 0.7–4.0)
MCH: 26.2 pg (ref 26.0–34.0)
MCHC: 30.7 g/dL (ref 30.0–36.0)
MCV: 85.3 fL (ref 80.0–100.0)
Monocytes Absolute: 0.8 10*3/uL (ref 0.1–1.0)
Monocytes Relative: 3 %
Neutro Abs: 8 10*3/uL — ABNORMAL HIGH (ref 1.7–7.7)
Neutrophils Relative %: 26 %
Platelets: 214 10*3/uL (ref 150–400)
RBC: 5.16 MIL/uL (ref 4.22–5.81)
RDW: 14.2 % (ref 11.5–15.5)
WBC: 30.2 10*3/uL — ABNORMAL HIGH (ref 4.0–10.5)
nRBC: 0.1 % (ref 0.0–0.2)

## 2018-12-30 LAB — C-REACTIVE PROTEIN: CRP: 4.3 mg/dL — ABNORMAL HIGH (ref <1.0)

## 2018-12-30 MED ORDER — DIGOXIN 125 MCG PO TABS: 0.1250 mg | ORAL_TABLET | Freq: Every day | ORAL | 0 refills | Status: DC

## 2018-12-30 MED ORDER — METHYLPREDNISOLONE 4 MG PO TBPK: ORAL_TABLET | ORAL | 0 refills | Status: DC

## 2018-12-30 MED ORDER — METOPROLOL SUCCINATE ER 100 MG PO TB24
100.0000 mg | ORAL_TABLET | Freq: Every day | ORAL | Status: DC
  Administered 2018-12-30: 100 mg via ORAL
  Filled 2018-12-30: qty 1

## 2018-12-30 MED ORDER — FUROSEMIDE 10 MG/ML IJ SOLN
40.0000 mg | Freq: Once | INTRAMUSCULAR | Status: AC
  Administered 2018-12-30: 40 mg via INTRAVENOUS
  Filled 2018-12-30: qty 4

## 2018-12-30 MED ORDER — METOPROLOL SUCCINATE ER 50 MG PO TB24: 50.0000 mg | ORAL_TABLET | Freq: Every day | ORAL | 0 refills | Status: DC

## 2018-12-30 MED ORDER — ENTRESTO 49-51 MG PO TABS: 1.0000 | ORAL_TABLET | Freq: Every day | ORAL | Status: DC

## 2018-12-30 MED ORDER — METHYLPREDNISOLONE SODIUM SUCC 40 MG IJ SOLR
10.0000 mg | Freq: Every day | INTRAMUSCULAR | Status: DC
  Administered 2018-12-30: 10 mg via INTRAVENOUS
  Filled 2018-12-30: qty 1

## 2018-12-30 NOTE — Progress Notes (Signed)
Occupational Therapy Treatment Patient Details Name: CALIPH QUINTO MRN: [id number] DOB: 09-12-1945 Today's Date: 12/30/2018    History of present illness 73 y.o. male with PMHx of chronic a-fib-s/p ablation-on Xarelto, hx of CAD-s/p CABG Q000111Q, chronic systolic heart failure, HTN, HLD-scented with 3-4-day hx worsening SOB-he had been feeling unwell with fatigue and malaise for approximately 10 days.  In the ED, he was found to have acute hypoxic respiratory failure requiring around 3-4 L of oxygen, he was also found to have significant transaminitis dx: acute hypoxic respiratory failure due to SARS COVID-19 viral pneumonia.   OT comments  Pt progressing towards established OT goals and continues to present with high motivated to return to PLOF. Pt getting dressed with Supervision-Mod I with rest breaks and purse lip breathing to elevate SpO2 on RA; SpO2 fluctuating between 91-87%. Pt performing functional mobility in hallway with Supervision and SpO2 dropping to 79% on RA. Pt requiring 2L O2 to maintain SpO2 >85% during mobility. Reviewed education on [facility] for ADLs and IADLs; pt verbalizing ways to implement into daily routine. Continue to recommend dc to home and will continue to follow acutely as admitted.    Follow Up Recommendations  No OT follow up;Supervision/Assistance - 24 hour    Equipment Recommendations  None recommended by OT    Recommendations for Other Services PT consult    Precautions / Restrictions Precautions Precautions: None Precaution Comments: watch SpO2 Restrictions Weight Bearing Restrictions: No       Mobility Bed Mobility Overal bed mobility: Modified Independent             General bed mobility comments: Sitting in recliner  Transfers Overall transfer level: Needs assistance Equipment used: None Transfers: Sit to/from Stand Sit to Stand: Supervision         General transfer comment: Supervision for safety    Balance Overall balance assessment:  No apparent balance deficits (not formally assessed) Sitting-balance support: No upper extremity supported;Feet supported Sitting balance-[name]: Normal     Standing balance support: No upper extremity supported;During functional activity Standing balance-[name]: Good                             ADL either performed or assessed with clinical judgement   ADL Overall ADL's : Needs assistance/impaired                 Upper Body Dressing : Modified independent;Sitting Upper Body Dressing Details (indicate cue type and reason): Donning shirt. Increased time for rest breaks and pursed lip breathing Lower Body Dressing: Supervision/safety;Sit to/from stand Lower Body Dressing Details (indicate cue type and reason): donning pants             Functional mobility during ADLs: Supervision/safety General ADL Comments: Pt donning clothes in preparation for dc. Performing functional mobility in hallway with Supervision. Reviewed EC handout and discussed ways to impliment into his daily routine.     Vision       Perception     Praxis      Cognition Arousal/Alertness: Awake/alert Behavior During Therapy: WFL for tasks assessed/performed Overall Cognitive Status: Within Functional Limits for tasks assessed                                 General Comments: Very motivated to participate in therapy        Exercises     Shoulder  Instructions       General Comments Good pleth with O2 monitor on earlobe. Upon arrival, Spo2 89-87% on RA with pt seated in recliner. SpO2 dropping to 79%, HR elevating to 120s, and RR 20-30s during functional mobility in hallway on RA. Placed pt on 2L O2 and he required significantly long standing rest break to elevate SpO2 to 88%. Pt then flucuating between 93-85% on 2L during mobility. SOB noted at end of mobility. Pt returning to RA while seated at EOB with RN in room; SpO2 92-87% on RA    Pertinent Vitals/  Pain       Pain Assessment: No/denies pain  Home Living                                          Prior Functioning/Environment              Frequency  Min 3X/week        Progress Toward Goals  OT Goals(current goals can now be found in the care plan section)  Progress towards OT goals: Progressing toward goals  Acute Rehab OT Goals Patient Stated Goal: Go home soon OT Goal Formulation: With patient Time For Goal Achievement: 01/11/19 Potential to Achieve Goals: Good ADL Goals Pt Will Perform Grooming: Independently;standing Pt Will Perform Lower Body Dressing: Independently;sit to/from stand Pt Will Transfer to Toilet: Independently;ambulating;regular height toilet Pt Will Perform Toileting - Clothing Manipulation and hygiene: Independently;sitting/lateral leans;sit to/from stand Additional ADL Goal #1: Pt will independently monitor SpO2 and utilize purse lip breathing during ADLs  Plan Discharge plan remains appropriate    Co-evaluation                 AM-PAC OT "6 Clicks" Daily Activity     Outcome Measure   Help from another person eating meals?: None Help from another person taking care of personal grooming?: None Help from another person toileting, which includes using toliet, bedpan, or urinal?: None Help from another person bathing (including washing, rinsing, drying)?: None Help from another person to put on and taking off regular upper body clothing?: None Help from another person to put on and taking off regular lower body clothing?: None 6 Click Score: 24    End of Session Equipment Utilized During Treatment: Oxygen(2L)  OT Visit Diagnosis: Unsteadiness on feet (R26.81);Other abnormalities of gait and mobility (R26.89);Muscle weakness (generalized) (M62.81)   Activity Tolerance Patient tolerated treatment well   Patient Left in chair;with call bell/phone within reach   Nurse Communication Mobility status        Time:  [id number] OT Time Calculation (min): 34 min  Charges: OT General Charges $OT Visit: 1 Visit OT Treatments $Self Care/Home Management : 8-22 mins $Therapeutic Activity: 8-22 mins  [place], OTR/L Acute Rehab Pager: [phone] Office: [place] 12/30/2018, 9:37 AM

## 2018-12-30 NOTE — Plan of Care (Signed)

## 2018-12-30 NOTE — Progress Notes (Signed)
CSW spoke to [place] with [employer] regarding oxygen for patient, reports they will deliver to home and call RN once delivered so that patient can be discharged.   Home Address : Rodney Burns  [facility] informed of oxygen tank to be taken from [employer] closet for patient transport home.   [name], [place]

## 2018-12-30 NOTE — Discharge Summary (Addendum)
Rodney Burns PHX:[id number] DOB: [birth year] DOA: 12/24/2018  PCP: Hulan Fess, MD  Admit date: 12/24/2018  Discharge date: 12/30/2018  Admitted From: Home   Disposition:  Home   Recommendations for Outpatient Follow-up:   Follow up with PCP in 1-2 weeks  PCP Please obtain BMP/CBC, 2 view CXR in 1week,  (see Discharge instructions)   PCP Please follow up on the following pending results: check CBC, CMP in 2 weeks.   Home Health: None   Equipment/Devices: 2 L oxygen when ambulates Consultations: None  Discharge Condition: Stable    CODE STATUS: Full    Diet Recommendation: Heart Healthy with 1.5lit/day fluid restriction  Diet Order            Diet - low sodium heart healthy        Diet Heart Room service appropriate? Yes; Fluid consistency: Thin  Diet effective now               Chief Complaint  Patient presents with  . Covid/SOB  . r/o covid     Brief history of present illness from the day of admission and additional interim summary     Patient is a73 y.o.malewith PMHx ofchronic atrial fibrillation-status post ablation-on Xarelto, history of CAD-s/p CABG 0165, chronic systolic heart failure, HTN, HLD-scented with 3-4-day history of worsening shortness of breath-he had been feeling unwell with fatigue and malaise for approximately 10 days. In the ED, he was found to have acute hypoxic respiratory failure requiring around 3-4 L of oxygen, he was also found to have significant transaminitis. He was transferred to [facility] for further evaluation and treatment.                                                                 Hospital Course     1. Acute Hypoxic Resp. Failure due to Acute Covid 19 Viral Pneumonitis during the ongoing 2020 Covid 19 Pandemic - he had severe disease,  he was been treated  with steroids and convalescent plasma.  Due to severe transaminitis he could not get remdesivir or Actemra.  Now symptom free on RA at rest and will be [place] home with PO steroid taper and PCP follow up.  Upon ambulation he does require some oxygen at times and will be given 2 L to be used as needed for the next few weeks.  COVID-19 Labs  Recent Labs    12/28/18 0344 12/29/18 0450 12/30/18 0040  DDIMER 1.12* 1.23*  --   CRP 6.4* 4.7* 4.3*    No results found for: SARSCOV2NAA  Hepatic Function Latest Ref Rng & Units 12/30/2018 12/29/2018 12/28/2018  Total Protein 6.5 - 8.1 g/dL 6.1(L) 6.2(L) 6.0(L)  Albumin 3.5 - 5.0 g/dL 3.0(L) 3.1(L) 2.9(L)  AST 15 - 41 U/L 69(H) 68(H) 66(H)  ALT  0 - 44 U/L 216(H) 231(H) 252(H)  Alk Phosphatase 38 - 126 U/L 45 46 48  Total Bilirubin 0.3 - 1.2 mg/dL 1.3(H) 1.6(H) 1.0      2.  Transaminitis.  Due to COVID-19 infection.  Trend is improving and he is symptom-free monitor. PCP to recheck CMP in 2 weeks.  3.  Chronic systolic heart failure with EF 45% on echocardiogram done 10/21/2017.  Appears euvolemic.  Home Rx except Toprol dose increased, digoxin added and Entresto to once a day, note he was never taking it BID. Will follow with his EP in 2 weeks.  4.  Chronic atrial fibrillation.  In RVR, have increased Toprol-XL dose, IV digoxin loading dose given again on 12/28/2018, will place him on oral digoxin from 12/29/2018, rate control improved and currently in NSR, CHADVASC2 - > 3, on Xaralto. .  5.  CAD.  Stable no acute issues on beta-blocker, statin and Xarelto for secondary prevention.  6.  GERD.  On PPI.  7.  Leukocytosis.  Likely steroid-induced.  Afebrile, stable procalcitonin, stable repeat chest x-ray. PCP to repeat CBC in 2 weeks.   8.  Hyperkalemia.  Resolved after Lasix and Kayexalate.  Discharge diagnosis     Principal Problem:   Pneumonia due to COVID-19 virus Active Problems:   Hypertension   Ischemic cardiomyopathy   Atrial  fibrillation with RVR ([place])   Coronary artery disease involving native coronary artery without angina pectoris   Hypercholesterolemia   Chronic systolic CHF (congestive heart failure) ([place])   Acute respiratory failure with hypoxia ([place])   Sepsis due to COVID-19 [facility])    Discharge instructions    Discharge Instructions    Diet - low sodium heart healthy   Complete by: As directed    Discharge instructions   Complete by: As directed    Follow with Primary MD Hulan Fess, MD and your Cardiologist in 14 days   Get CBC, CMP, 2 view Chest X ray -  checked next visit within 2 weeks by Primary MD    Activity: As tolerated with Full fall precautions use walker/cane & assistance as needed  Disposition Home   Diet: Heart Healthy with 1.5lit/day fluid restriction  Special Instructions: If you have smoked or chewed Tobacco  in the last 2 yrs please stop smoking, stop any regular Alcohol  and or any Recreational drug use.  On your next visit with your primary care physician please Get Medicines reviewed and adjusted.  Please request your Prim.MD to go over all Hospital Tests and Procedure/Radiological results at the follow up, please get all Hospital records sent to your Prim MD by signing hospital release before you go home.  If you experience worsening of your admission symptoms, develop shortness of breath, life threatening emergency, suicidal or homicidal thoughts you must seek medical attention immediately by calling 911 or calling your MD immediately  if symptoms less severe.  You Must read complete instructions/literature along with all the possible adverse reactions/side effects for all the Medicines you take and that have been prescribed to you. Take any new Medicines after you have completely understood and accpet all the possible adverse reactions/side effects.   Do not drive, operate heavy machinery, perform activities at heights, swimming or participation in water activities or  provide baby sitting services if your were admitted for syncope or siezures until you have seen by Primary MD or a Neurologist and advised to do so again.  Do not drive when taking Pain medications.  Do not  take more than prescribed Pain, Sleep and Anxiety Medications  Wear Seat belts while driving.   Please note  You were cared for by a hospitalist during your hospital stay. If you have any questions about your discharge medications or the care you received while you were in the hospital after you are discharged, you can call the unit and asked to speak with the hospitalist on call if the hospitalist that took care of you is not available. Once you are discharged, your primary care physician will handle any further medical issues. Please note that NO REFILLS for any discharge medications will be authorized once you are discharged, as it is imperative that you return to your primary care physician (or establish a relationship with a primary care physician if you do not have one) for your aftercare needs so that they can reassess your need for medications and monitor your lab values.   Increase activity slowly   Complete by: As directed    MyChart COVID-19 home monitoring program   Complete by: Dec 30, 2018    Is the patient willing to use the [place] for home monitoring?: Yes   Temperature monitoring   Complete by: Dec 30, 2018    After how many days would you like to receive a notification of this patient's flowsheet entries?: 1      Discharge Medications   Allergies as of 12/30/2018      Reactions   Penicillins Other (See Comments)   Put pt in hospital 50 yrs ago   Ace Inhibitors Cough   Darvocet [propoxyphene N-acetaminophen] Rash   Tolerates tylenol   Sulfa Antibiotics Rash      Medication List    TAKE these medications   albuterol 108 (90 Base) MCG/ACT inhaler Commonly known as: VENTOLIN HFA Inhale 1 puff into the lungs every 6 (six) hours as needed for wheezing  or shortness of breath.   CoQ-10 200 MG Caps Take 200 mg by mouth daily.   digoxin 0.125 MG tablet Commonly known as: LANOXIN Take 1 tablet (0.125 mg total) by mouth daily. Start taking on: December 31, 2018   Entresto 49-51 MG Generic drug: sacubitril-valsartan Take 1 tablet by mouth daily.   ezetimibe-simvastatin 10-20 MG tablet Commonly known as: VYTORIN TAKE 1 TABLET AT BEDTIME What changed: when to take this   Fiber (Guar Gum) Chew Chew 3 capsules by mouth daily.   latanoprost 0.005 % ophthalmic solution Commonly known as: XALATAN Place 1 drop into both eyes at bedtime.   Magnesium 250 MG Tabs Take 500 mg by mouth at bedtime.   methylPREDNISolone 4 MG Tbpk tablet Commonly known as: MEDROL DOSEPAK follow package directions   metoprolol succinate 50 MG 24 hr tablet Commonly known as: Toprol XL Take 1 tablet (50 mg total) by mouth daily. What changed:   medication strength  See the new instructions.   pramipexole 0.5 MG tablet Commonly known as: MIRAPEX Take 1.5 mg by mouth daily.   RABEprazole 20 MG tablet Commonly known as: ACIPHEX Take 1 tablet (20 mg total) by mouth daily.   STOOL SOFTENER PO Take by mouth 2 (two) times daily.   traMADol 50 MG tablet Commonly known as: ULTRAM Take 1 tablet (50 mg total) by mouth every 6 (six) hours as needed for moderate pain.   Xarelto 20 MG Tabs tablet Generic drug: rivaroxaban TAKE 1 TABLET DAILY WITH SUPPER What changed: See the new instructions.       Follow-up Information  Little, Lennette Bihari, MD. Schedule an appointment as soon as possible for a visit in 2 week(s).   Specialty: Family Medicine Contact information: [place] [address] [phone]        Martinique, [name], MD. Schedule an appointment as soon as possible for a visit in 2 week(s).   Specialty: Cardiology Contact information: [address] [place] [address] [phone]           Major  procedures and Radiology Reports - PLEASE review detailed and final reports thoroughly  -        Cxr Am  Result Date: 12/26/2018 CLINICAL DATA:  Shortness of breath EXAM: PORTABLE CHEST 1 VIEW COMPARISON:  12/25/2018 FINDINGS: Signs of median sternotomy and CABG with similar appearance to the prior exam. Slight increased interstitial markings also unchanged. No signs of dense consolidation or evidence of pleural effusion. No signs of acute bone finding. IMPRESSION: Persistent increase in interstitial prominence since prior study could represent atypical infection or asymmetric edema. No consolidation or effusion. 1. Electronically Signed   By: Zetta Bills M.D.   On: 12/26/2018 11:33   Dg Chest Port Am  Result Date: 12/25/2018 CLINICAL DATA:  Shortness of breath. EXAM: PORTABLE CHEST 1 VIEW COMPARISON:  December 24, 2018. FINDINGS: The heart size and mediastinal contours are within normal limits. No pneumothorax or pleural effusion is noted. Minimally increased diffuse interstitial densities are noted throughout both lungs concerning for atypical inflammation or infection. Status post coronary bypass graft. The visualized skeletal structures are unremarkable. IMPRESSION: Minimally increased diffuse interstitial densities are noted throughout both lungs concerning for atypical inflammation or infection. Followup radiographs are recommended until resolution. Electronically Signed   By: Marijo Conception M.D.   On: 12/25/2018 08:10   Dg Chest Portable 1 View  Result Date: 12/24/2018 CLINICAL DATA:  Cough, shortness of breath EXAM: PORTABLE CHEST 1 VIEW COMPARISON:  03/20/2017 FINDINGS: Post CABG changes. Stable mild cardiomegaly. No focal airspace consolidation. No pleural effusion or pneumothorax. IMPRESSION: No acute cardiopulmonary findings. Electronically Signed   By: Davina Poke M.D.   On: 12/24/2018 11:13    Micro Results     Recent Results (from the past 240 hour(s))  Blood Culture  (routine x 2)     Status: None   Collection Time: 12/24/18 11:35 AM   Specimen: BLOOD  Result Value Ref Range Status   Specimen Description BLOOD RIGHT ANTECUBITAL  Final   Special Requests   Final    BOTTLES DRAWN AEROBIC AND ANAEROBIC Blood Culture adequate volume   Culture   Final    NO GROWTH 5 DAYS Performed at [facility], [address] [place], [place] [address]    Report Status 12/29/2018 FINAL  Final  Blood Culture (routine x 2)     Status: None   Collection Time: 12/24/18 12:24 PM   Specimen: BLOOD  Result Value Ref Range Status   Specimen Description BLOOD LEFT ANTECUBITAL  Final   Special Requests   Final    BOTTLES DRAWN AEROBIC AND ANAEROBIC Blood Culture results may not be optimal due to an inadequate volume of blood received in culture bottles   Culture   Final    NO GROWTH 5 DAYS Performed at [facility], [place] [address], [place], [place] [address]    Report Status 12/29/2018 FINAL  Final    Today   Subjective    Rodney Burns today has no headache,no chest abdominal pain,no new weakness  tingling or numbness, feels much better wants to go home today.     Objective   Blood pressure (!) 141/90, pulse 86, temperature 98.1 F (36.7 C), temperature source Oral, resp. rate (!) 24, height '5\' 10"'  (1.778 m), weight 95.3 kg, SpO2 95 %.   Intake/Output Summary (Last 24 hours) at 12/30/2018 0930 Last data filed at 12/30/2018 0500 Gross per 24 hour  Intake 660 ml  Output -  Net 660 ml    Exam Awake Alert, Oriented x 3, No new F.N deficits, Normal affect [place].AT,PERRAL Supple Neck,No JVD, No cervical lymphadenopathy appriciated.  Symmetrical Chest wall movement, Good air movement bilaterally, CTAB RRR,No Gallops,Rubs or new Murmurs, No Parasternal Heave +ve B.Sounds, Abd Soft, Non tender, No organomegaly appriciated, No rebound -guarding or rigidity. No Cyanosis, Clubbing or edema, No new Rash or bruise   Data Review   CBC w Diff:  Lab Results   Component Value Date   WBC 30.2 (H) 12/30/2018   HGB 13.5 12/30/2018   HGB 13.8 08/27/2018   HGB 13.8 08/22/2016   HCT 44.0 12/30/2018   HCT 44.1 08/22/2016   PLT 214 12/30/2018   PLT 115 (L) 08/27/2018   PLT 128 (L) 08/22/2016   LYMPHOPCT 70 12/30/2018   LYMPHOPCT 80.3 (H) 08/22/2016   MONOPCT 3 12/30/2018   MONOPCT 1.6 08/22/2016   EOSPCT 0 12/30/2018   EOSPCT 0.5 08/22/2016   BASOPCT 0 12/30/2018   BASOPCT 0.1 08/22/2016    CMP:  Lab Results  Component Value Date   NA 136 12/30/2018   NA 140 11/06/2017   NA 141 08/22/2016   K 4.9 12/30/2018   K 4.5 08/22/2016   CL 101 12/30/2018   CL 105 01/02/2012   CO2 26 12/30/2018   CO2 28 08/22/2016   BUN 30 (H) 12/30/2018   BUN 13 11/06/2017   BUN 16.6 08/22/2016   CREATININE 0.87 12/30/2018   CREATININE 0.94 08/27/2018   CREATININE 0.9 08/22/2016   PROT 6.1 (L) 12/30/2018   PROT 6.1 10/16/2017   PROT 6.6 08/22/2016   ALBUMIN 3.0 (L) 12/30/2018   ALBUMIN 4.1 10/16/2017   ALBUMIN 4.1 08/22/2016   BILITOT 1.3 (H) 12/30/2018   BILITOT 1.0 08/27/2018   BILITOT 0.82 08/22/2016   ALKPHOS 45 12/30/2018   ALKPHOS 46 08/22/2016   AST 69 (H) 12/30/2018   AST 19 08/27/2018   AST 22 08/22/2016   ALT 216 (H) 12/30/2018   ALT 22 08/27/2018   ALT 23 08/22/2016  .   Total Time in preparing paper work, data evaluation and todays exam - 42 minutes  Lala Lund M.D on 12/30/2018 at 9:30 AM  [facility]   Office  [phone]

## 2018-12-30 NOTE — Progress Notes (Signed)
Patient declined RN contacting family for update because patient being discharged. Oxygen delivered to patient's house. Awaiting arrival of patient's wife for discharge. Discharge paperwork reviewed. PIV to be removed.

## 2018-12-30 NOTE — Progress Notes (Signed)
SATURATION QUALIFICATIONS: (This note is used to comply with regulatory documentation for home oxygen)  Patient Saturations on Room Air at Rest = 90%  Patient Saturations on Room Air while Ambulating = 84%  Patient Saturations on 2 Liters of oxygen while Ambulating = 90%  Please briefly explain why patient needs home oxygen: Patient oxygen desaturation drops below 88% while on exertion. Needs oxygen at home

## 2018-12-30 NOTE — Progress Notes (Signed)
Patient discharged at 2245 with all discharge instructions, belongings, and PIV removed.

## 2018-12-30 NOTE — Care Management Important Message (Signed)
Important Message  Patient Details  Name: Rodney Burns MRN: [id number] Date of Birth: April 10, 1945   Medicare Important Message Given:  Yes - Important Message mailed due to current National Emergency  Verbal consent obtained due to current National Emergency  Relationship to patient: Spouse/Significant Other Contact Name: Oshen Serratore Call Date: 12/30/18  Time: 1328 Phone: [phone] Outcome: Spoke with contact Important Message mailed to: Patient address on file    [place] 12/30/2018, 1:28 PM

## 2018-12-30 NOTE — Progress Notes (Addendum)
SATURATION QUALIFICATIONS: (This note is used to comply with regulatory documentation for home oxygen)  Patient Saturations on Room Air at Rest = 91-87%  Patient Saturations on Room Air while Ambulating = 84-79%  Patient Saturations on 2 Liters of oxygen while Ambulating = 93-85%  Please briefly explain why patient needs home oxygen: Pt continues to requiring 2L O2 during activity to maintain SpO2 >85%.

## 2018-12-30 NOTE — Progress Notes (Signed)
D/c paperwork explained, no further questions portable oxygen at bedside, oxygen being delivered to home, no respiratory distress 89 on RA. PIV will be removed. Will continue to monitor. Wife awaiting for delivery of O2 at home and will be transporting pt home.

## 2018-12-30 NOTE — Plan of Care (Signed)
  Problem: Education: Goal: Knowledge of risk factors and measures for prevention of condition will improve 12/30/2018 1709 by Levie Heritage, RN Outcome: Adequate for Discharge 12/30/2018 1709 by Levie Heritage, RN Outcome: Progressing   Problem: Coping: Goal: Psychosocial and spiritual needs will be supported 12/30/2018 1709 by Levie Heritage, RN Outcome: Adequate for Discharge 12/30/2018 1709 by Levie Heritage, RN Outcome: Progressing   Problem: Respiratory: Goal: Will maintain a patent airway 12/30/2018 1709 by Levie Heritage, RN Outcome: Adequate for Discharge 12/30/2018 1709 by Levie Heritage, RN Outcome: Progressing Goal: Complications related to the disease process, condition or treatment will be avoided or minimized 12/30/2018 1709 by Levie Heritage, RN Outcome: Adequate for Discharge 12/30/2018 1709 by Levie Heritage, RN Outcome: Progressing   Problem: Education: Goal: Knowledge of General Education information will improve Description: Including pain rating scale, medication(s)/side effects and non-pharmacologic comfort measures 12/30/2018 1709 by Levie Heritage, RN Outcome: Adequate for Discharge 12/30/2018 1709 by Levie Heritage, RN Outcome: Progressing   Problem: Health Behavior/Discharge Planning: Goal: Ability to manage health-related needs will improve 12/30/2018 1709 by Levie Heritage, RN Outcome: Adequate for Discharge 12/30/2018 1709 by Levie Heritage, RN Outcome: Progressing   Problem: Clinical Measurements: Goal: Ability to maintain clinical measurements within normal limits will improve 12/30/2018 1709 by Levie Heritage, RN Outcome: Adequate for Discharge 12/30/2018 1709 by Levie Heritage, RN Outcome: Progressing Goal: Will remain free from infection 12/30/2018 1709 by Levie Heritage, RN Outcome: Adequate for Discharge 12/30/2018 1709 by Levie Heritage, RN Outcome: Progressing Goal: Diagnostic test results will improve 12/30/2018 1709 by Levie Heritage, RN Outcome: Adequate  for Discharge 12/30/2018 1709 by Levie Heritage, RN Outcome: Progressing Goal: Respiratory complications will improve 12/30/2018 1709 by Levie Heritage, RN Outcome: Adequate for Discharge 12/30/2018 1709 by Levie Heritage, RN Outcome: Progressing Goal: Cardiovascular complication will be avoided 12/30/2018 1709 by Levie Heritage, RN Outcome: Adequate for Discharge 12/30/2018 1709 by Levie Heritage, RN Outcome: Progressing   Problem: Activity: Goal: Risk for activity intolerance will decrease 12/30/2018 1709 by Levie Heritage, RN Outcome: Adequate for Discharge 12/30/2018 1709 by Levie Heritage, RN Outcome: Progressing   Problem: Nutrition: Goal: Adequate nutrition will be maintained 12/30/2018 1709 by Levie Heritage, RN Outcome: Adequate for Discharge 12/30/2018 1709 by Levie Heritage, RN Outcome: Progressing   Problem: Coping: Goal: Level of anxiety will decrease 12/30/2018 1709 by Levie Heritage, RN Outcome: Adequate for Discharge 12/30/2018 1709 by Levie Heritage, RN Outcome: Progressing   Problem: Elimination: Goal: Will not experience complications related to bowel motility 12/30/2018 1709 by Levie Heritage, RN Outcome: Adequate for Discharge 12/30/2018 1709 by Levie Heritage, RN Outcome: Progressing Goal: Will not experience complications related to urinary retention 12/30/2018 1709 by Levie Heritage, RN Outcome: Adequate for Discharge 12/30/2018 1709 by Levie Heritage, RN Outcome: Progressing   Problem: Pain Managment: Goal: General experience of comfort will improve 12/30/2018 1709 by Levie Heritage, RN Outcome: Adequate for Discharge 12/30/2018 1709 by Levie Heritage, RN Outcome: Progressing   Problem: Safety: Goal: Ability to remain free from injury will improve 12/30/2018 1709 by Levie Heritage, RN Outcome: Adequate for Discharge 12/30/2018 1709 by Levie Heritage, RN Outcome: Progressing   Problem: Skin Integrity: Goal: Risk for impaired skin integrity will decrease 12/30/2018 1709  by Levie Heritage, RN Outcome: Adequate for Discharge 12/30/2018 1709 by Levie Heritage, RN Outcome: Progressing

## 2018-12-31 ENCOUNTER — Telehealth: Payer: Self-pay | Admitting: Cardiology

## 2018-12-31 NOTE — Telephone Encounter (Signed)
Spoke to patient Dr.Jordan's advice given.He will take Entresto 49/51 mg twice a day.Advised to monitor B/P and call if low.Stated B/P today 130/68.

## 2018-12-31 NOTE — Telephone Encounter (Signed)
Entresto should be twice a day. I don't understand why it was reduced to once a day. Monitor Blood pressure and let me know if it is low.  Aison Malveaux Martinique MD, [facility]

## 2018-12-31 NOTE — Telephone Encounter (Signed)
Per pt's discharge pt was instructed to take Entresto 49/51 mg 1 daily Pt calling to get clarification Will forward to Dr Martinique for review .Adonis Housekeeper

## 2018-12-31 NOTE — Telephone Encounter (Signed)
Pt c/o medication issue:  1. Name of Medication: sacubitril-valsartan (ENTRESTO) 49-51 MG  2. How are you currently taking this medication (dosage and times per day)? 1 tablet twice a day   3. Are you having a reaction (difficulty breathing--STAT)? No  4. What is your medication issue? Patient is calling because Dr. Martinique advised patient to take medication as 1 tablet twice a day, but the hospital advised him to take 1 tablet once a day in the morning when they discharged him lastnight . Please Advise.

## 2019-01-03 ENCOUNTER — Other Ambulatory Visit: Payer: Self-pay | Admitting: Hematology and Oncology

## 2019-01-03 ENCOUNTER — Other Ambulatory Visit: Payer: Self-pay | Admitting: Cardiology

## 2019-01-03 DIAGNOSIS — C9110 Chronic lymphocytic leukemia of B-cell type not having achieved remission: Secondary | ICD-10-CM

## 2019-01-04 ENCOUNTER — Other Ambulatory Visit: Payer: Self-pay | Admitting: Cardiology

## 2019-01-14 ENCOUNTER — Telehealth: Payer: Self-pay

## 2019-01-14 ENCOUNTER — Other Ambulatory Visit: Payer: Self-pay

## 2019-01-14 ENCOUNTER — Other Ambulatory Visit: Payer: Self-pay | Admitting: Hematology and Oncology

## 2019-01-14 DIAGNOSIS — C9110 Chronic lymphocytic leukemia of B-cell type not having achieved remission: Secondary | ICD-10-CM

## 2019-01-14 DIAGNOSIS — A4189 Other specified sepsis: Secondary | ICD-10-CM

## 2019-01-14 DIAGNOSIS — U071 COVID-19: Secondary | ICD-10-CM

## 2019-01-14 NOTE — Telephone Encounter (Signed)
Pt called per MD request to follow up with instructions on recent hospital discharge.   Per pt discharged instructions include to obtain labs (CBC and CMP) and 2 view CXR 2 weeks after discharge.   Pt scheduled accordingly.   No further needs.

## 2019-01-18 ENCOUNTER — Ambulatory Visit (HOSPITAL_COMMUNITY)
Admission: RE | Admit: 2019-01-18 | Discharge: 2019-01-18 | Disposition: A | Discharge status: Home / Self Care | Payer: Medicare Other | Source: Ambulatory Visit | Attending: Hematology and Oncology | Admitting: Hematology and Oncology

## 2019-01-18 ENCOUNTER — Inpatient Hospital Stay: Payer: Medicare Other | Attending: Hematology and Oncology

## 2019-01-18 ENCOUNTER — Other Ambulatory Visit: Payer: Self-pay

## 2019-01-18 DIAGNOSIS — U071 COVID-19: Secondary | ICD-10-CM | POA: Insufficient documentation

## 2019-01-18 DIAGNOSIS — C9110 Chronic lymphocytic leukemia of B-cell type not having achieved remission: Secondary | ICD-10-CM

## 2019-01-18 DIAGNOSIS — A4189 Other specified sepsis: Secondary | ICD-10-CM | POA: Insufficient documentation

## 2019-01-18 LAB — CBC WITH DIFFERENTIAL (CANCER CENTER ONLY)
Abs Immature Granulocytes: 0.04 10*3/uL (ref 0.00–0.07)
Basophils Absolute: 0 10*3/uL (ref 0.0–0.1)
Basophils Relative: 0 %
Eosinophils Absolute: 0.2 10*3/uL (ref 0.0–0.5)
Eosinophils Relative: 1 %
HCT: 42.7 % (ref 39.0–52.0)
Hemoglobin: 13.2 g/dL (ref 13.0–17.0)
Immature Granulocytes: 0 %
Lymphocytes Relative: 70 %
Lymphs Abs: 12.7 10*3/uL — ABNORMAL HIGH (ref 0.7–4.0)
MCH: 26.5 pg (ref 26.0–34.0)
MCHC: 30.9 g/dL (ref 30.0–36.0)
MCV: 85.6 fL (ref 80.0–100.0)
Monocytes Absolute: 1.3 10*3/uL — ABNORMAL HIGH (ref 0.1–1.0)
Monocytes Relative: 7 %
Neutro Abs: 4.1 10*3/uL (ref 1.7–7.7)
Neutrophils Relative %: 22 %
Platelet Count: 166 10*3/uL (ref 150–400)
RBC: 4.99 MIL/uL (ref 4.22–5.81)
RDW: 15.9 % — ABNORMAL HIGH (ref 11.5–15.5)
WBC Count: 18.4 10*3/uL — ABNORMAL HIGH (ref 4.0–10.5)
nRBC: 0 % (ref 0.0–0.2)

## 2019-01-18 LAB — CMP (CANCER CENTER ONLY)
ALT: 67 U/L — ABNORMAL HIGH (ref 0–44)
AST: 22 U/L (ref 15–41)
Albumin: 3.1 g/dL — ABNORMAL LOW (ref 3.5–5.0)
Alkaline Phosphatase: 52 U/L (ref 38–126)
Anion gap: 9 (ref 5–15)
BUN: 13 mg/dL (ref 8–23)
CO2: 23 mmol/L (ref 22–32)
Calcium: 8.5 mg/dL — ABNORMAL LOW (ref 8.9–10.3)
Chloride: 105 mmol/L (ref 98–111)
Creatinine: 0.77 mg/dL (ref 0.61–1.24)
GFR, Est AFR Am: >60 mL/min (ref >60)
GFR, Estimated: >60 mL/min (ref >60)
Glucose, Bld: 145 mg/dL — ABNORMAL HIGH (ref 70–99)
Potassium: 4 mmol/L (ref 3.5–5.1)
Sodium: 137 mmol/L (ref 135–145)
Total Bilirubin: 0.7 mg/dL (ref 0.3–1.2)
Total Protein: 6.2 g/dL — ABNORMAL LOW (ref 6.5–8.1)

## 2019-01-18 LAB — LACTATE DEHYDROGENASE: LDH: 334 U/L — ABNORMAL HIGH (ref 98–192)

## 2019-01-19 ENCOUNTER — Ambulatory Visit (INDEPENDENT_AMBULATORY_CARE_PROVIDER_SITE_OTHER): Payer: Medicare Other | Admitting: Physician Assistant

## 2019-01-19 ENCOUNTER — Telehealth: Payer: Self-pay

## 2019-01-19 ENCOUNTER — Encounter: Payer: Self-pay | Admitting: Physician Assistant

## 2019-01-19 VITALS — BP 120/70 | HR 102 | Ht 70.0 in | Wt 200.4 lb

## 2019-01-19 DIAGNOSIS — I5022 Chronic systolic (congestive) heart failure: Secondary | ICD-10-CM | POA: Diagnosis not present

## 2019-01-19 DIAGNOSIS — I4892 Unspecified atrial flutter: Secondary | ICD-10-CM

## 2019-01-19 DIAGNOSIS — I255 Ischemic cardiomyopathy: Secondary | ICD-10-CM

## 2019-01-19 DIAGNOSIS — I10 Essential (primary) hypertension: Secondary | ICD-10-CM

## 2019-01-19 DIAGNOSIS — E785 Hyperlipidemia, unspecified: Secondary | ICD-10-CM

## 2019-01-19 DIAGNOSIS — I25810 Atherosclerosis of coronary artery bypass graft(s) without angina pectoris: Secondary | ICD-10-CM

## 2019-01-19 DIAGNOSIS — R0600 Dyspnea, unspecified: Secondary | ICD-10-CM | POA: Diagnosis not present

## 2019-01-19 MED ORDER — DIGOXIN 125 MCG PO TABS: 0.1250 mg | ORAL_TABLET | Freq: Every day | ORAL | 0 refills | Status: DC

## 2019-01-19 NOTE — Telephone Encounter (Signed)
Left message for patient to call back.  Re: Lab and CXR results

## 2019-01-19 NOTE — Progress Notes (Signed)
Cardiology Office Note:    Date:  01/21/2019   ID:  Rodney Burns, DOB 1945/11/05, MRN [id number]  PCP:  Hulan Fess, MD  Cardiologist:  [name], MD  Electrophysiologist:  None   Referring MD: Hulan Fess, MD   Chief Complaint  Patient presents with  . Follow-up    seen for Dr. Martinique    History of Present Illness:    Rodney Burns is a 73 y.o. male with a hx of atrial flutter, CAD, chronic systolic heart failure, CLL, hypertension, hyperlipidemia, TAA, and history of pericarditis in January 2017.  Patient has a history of CAD s/p CABG in 1994.  He had radiofrequency ablation in 2013.  Unfortunately he had recurrent atrial flutter after the ablation.  Myoview in March 2016 showed inferior and anteroapical infarct without evidence of ischemia, EF 32%.  Echocardiogram obtained during diagnosis of pericarditis in January 2017 was unremarkable.  He was treated with ibuprofen and colchicine at the time and has not had any recurrent chest discomfort.  He was last seen by Dr. Martinique in September 2019, at which time he gained 3 pounds.  He was instructed to take Lasix 20 mg daily for 4 days.  Repeat echocardiogram obtained on 10/21/2017 showed EF 40 to 45%, hypokinesis of the anteroseptal and inferoseptal myocardium, grade 1 DD, mild MR, 40 mm aortic root dilatation, PA peak pressure 34 mmHg.   More recently, patient was admitted in early December 2020 with acute hypoxic respiratory failure.  He required 3 to 4 L of oxygen in the hospital and had a significant transaminitis.  He was tested positive for COVID-19 and was treated with steroid and convalescent plasma.  Due to severe transaminitis, he was unable to get remdesivir or Actemra.  During the hospitalization, heart rate was elevated, Toprol-XL was increased and he was given IV digoxin.  He was placed on oral digoxin prior to discharge.  He presents along with his wife to cardiology office visit.  He still gets short of breath and can walk  around 2000 steps a day at this point.  This is still very low compared to his previous 10,000 steps during the day.  He is recovering very slowly from the recent Covid.  On physical exam, his heart rate seems to be very regular.  We obtained a EKG that demonstrated he has went back into a sinus tachycardia.  I recommend he increase Toprol-XL to 50 mg a.m. and 25 mg p.m. to give him better rate control.  I refilled his digoxin however recommended he get a digoxin level today.  We likely will continue his digoxin for the next 2 months.  Once he recovered from COVID-19, we likely will discontinue the digoxin.  Otherwise he has no lower extremity edema, orthopnea or PND.   Past Medical History:  Diagnosis Date  . Atrial tachycardia ([place])    a. s/p DCCV 09/2009; b. s/p RFCA 03/2011; c. s/p DCCV 04/2011; d. s/p RFCA 09/17/11.  . Cataract   . Chronic systolic CHF (congestive heart failure) ([place])    a. 01/2015 Echo: EF 40-45%, antsept/infsept HK, triv AI, mild MR, mod dil LA, nl RV, mildly dil RA.  [name] CLL (chronic lymphoblastic leukemia)    Stage 0-1  . Coronary artery disease involving native coronary artery without angina pectoris    a. Status post CABG in 1994:By Dr. Cyndia Bent. LIMA - L Cx, seqSVG-Diag-LAD, and SVG-PDA-PL.  [name] Cough    Consistant with ACE inhibitor mediated cough  .  Dysrhythmia   . Glaucoma (increased eye pressure) 1991  . History of tobacco abuse    quit 1994  . Hypercholesterolemia    Well Controlled  . Hypertension   . Ischemic cardiomyopathy    a. 02/2011 Echo: EF 40-45%;  b. 01/2015 Echo: EF 40-45%.  . Malaria 1972   Hx of  . Pericarditis    a. 01/2015-->Treated w/ ibuprofen/colchicine.  . Sleep-disordered breathing 06/20/2011    Past Surgical History:  Procedure Laterality Date  . ATRIAL FLUTTER ABLATION N/A 04/11/2011   Procedure: ATRIAL FLUTTER ABLATION;  Surgeon: Deboraha Sprang, MD;  Location: [facility] CATH LAB;  Service: Cardiovascular;  Laterality: N/A;  . ATRIAL FLUTTER  ABLATION N/A 09/17/2011   Procedure: ATRIAL FLUTTER ABLATION;  Surgeon: Thompson Grayer, MD;  Location: [facility] CATH LAB;  Service: Cardiovascular;  Laterality: N/A;  . CARDIOVERSION  06/03/2011   Procedure: CARDIOVERSION;  Surgeon: Deboraha Sprang, MD;  Location: [place];  Service: Cardiovascular;  Laterality: N/A;  . CATARACT EXTRACTION    . CORONARY ARTERY BYPASS GRAFT  1994   By Dr. Cyndia Bent. LIMA - L Cx, seqSVG-Diag-LAD, and SVG-PDA-PL  . ELECTROPHYSIOLOGY STUDY N/A 04/11/2011   Procedure: ELECTROPHYSIOLOGY STUDY;  Surgeon: Deboraha Sprang, MD;  Location: [facility] CATH LAB;  Service: Cardiovascular;  Laterality: N/A;  . EP study and ablation  2013   by Dr Caryl Comes, repeat ablation by Dr Rayann Heman  . NM MYOVIEW LTD  03/2014   scar in the inferior and anteroapical regions without ischemia. This is similar to finding on Myoview in 2013. EF is a little lower 39%>>32%.  [name] TRIGGER FINGER RELEASE  12/2016   left hand   . US ECHOCARDIOGRAPHY  09-07-09; 02/2011   a. Est EF 40-45%; b. EF 40-45%. Gr 2 DD. Mild LA dil    Current Medications: Current Meds  Medication Sig  . albuterol (VENTOLIN HFA) 108 (90 Base) MCG/ACT inhaler Inhale 1 puff into the lungs every 6 (six) hours as needed for wheezing or shortness of breath.  . Coenzyme Q10 (COQ-10) 200 MG CAPS Take 200 mg by mouth daily.  . digoxin (LANOXIN) 0.125 MG tablet Take 1 tablet (0.125 mg total) by mouth daily.  [name] Calcium (STOOL SOFTENER PO) Take by mouth 2 (two) times daily.  [name] ENTRESTO 49-51 MG TAKE 1 TABLET TWICE A DAY  . ezetimibe-simvastatin (VYTORIN) 10-20 MG tablet Take 1 tablet by mouth at bedtime.  . Fiber, Guar Gum, CHEW Chew 3 capsules by mouth daily.   [name] latanoprost (XALATAN) 0.005 % ophthalmic solution Place 1 drop into both eyes at bedtime.   . Magnesium 250 MG TABS Take 500 mg by mouth at bedtime.  . metoprolol succinate (TOPROL-XL) 50 MG 24 hr tablet Take 1 tablet (50 mg total) by mouth daily.  . pramipexole (MIRAPEX) 0.5 MG tablet Take 1.5  mg by mouth daily.   . RABEprazole (ACIPHEX) 20 MG tablet Take 1 tablet (20 mg total) by mouth daily.  . traMADol (ULTRAM) 50 MG tablet TAKE 1 TABLET EVERY 6 HOURS AS NEEDED FOR MODERATE PAIN  . XARELTO 20 MG TABS tablet TAKE 1 TABLET DAILY WITH SUPPER (Patient taking differently: Take 20 mg by mouth daily with supper. )  . [DISCONTINUED] digoxin (LANOXIN) 0.125 MG tablet Take 1 tablet (0.125 mg total) by mouth daily.     Allergies:   Penicillins, Ace inhibitors, Darvocet [propoxyphene n-acetaminophen], and Sulfa antibiotics   Social History   Socioeconomic History  . Marital status: Married    Spouse  name: Not on file  . Number of children: 2  . Years of education: Not on file  . Highest education level: Not on file  Occupational History  . Occupation: Careers adviser: RETIRED  Tobacco Use  . Smoking status: Former Smoker    Packs/day: 2.00    Years: 30.00    Pack years: 60.00    Types: Cigarettes    Quit date: 01/22/1992    Years since quitting: 27.0  . Smokeless tobacco: Never Used  Substance and Sexual Activity  . Alcohol use: No  . Drug use: No  . Sexual activity: Yes  Other Topics Concern  . Not on file  Social History Narrative  . Not on file   Social Determinants of Health   Financial Resource Strain:   . Difficulty of Paying Living Expenses: Not on file  Food Insecurity:   . Worried About Charity fundraiser in the Last Year: Not on file  . Ran Out of Food in the Last Year: Not on file  Transportation Needs:   . Lack of Transportation (Medical): Not on file  . Lack of Transportation (Non-Medical): Not on file  Physical Activity:   . Days of Exercise per Week: Not on file  . Minutes of Exercise per Session: Not on file  Stress:   . Feeling of Stress : Not on file  Social Connections:   . Frequency of Communication with Friends and Family: Not on file  . Frequency of Social Gatherings with Friends and Family: Not on file  . Attends Religious  Services: Not on file  . Active Member of Clubs or Organizations: Not on file  . Attends Archivist Meetings: Not on file  . Marital Status: Not on file     Family History: The patient's family history includes Cancer in his brother; Down syndrome in his son; Heart failure in his father.  ROS:   Please see the history of present illness.     All other systems reviewed and are negative.  EKGs/Labs/Other Studies Reviewed:    The following studies were reviewed today:  Echo 10/21/2017 Study Conclusions  - Left ventricle: The cavity size was normal. Wall thickness was   increased in a pattern of mild LVH. Systolic function was mildly   to moderately reduced. The estimated ejection fraction was in the range of 40% to 45%. Diffuse hypokinesis. There is hypokinesis of the anteroseptal and inferoseptal myocardium. Doppler parameters are consistent with abnormal left ventricular relaxation (grade 1 diastolic dysfunction). - Ventricular septum: The contour showed diastolic flattening. - Aortic valve: Trileaflet; mildly thickened, mildly calcified   leaflets. - Aorta: Aortic root dimension: 40 mm (ED). - Ascending aorta: The ascending aorta was mildly dilated. - Mitral valve: There was mild regurgitation directed eccentrically   and posteriorly. - Pulmonary arteries: Systolic pressure was mildly increased. PA   peak pressure: 34 mm Hg (S).  Impressions:  - Compared to the prior study, there has been no significant   interval change.   EKG:  EKG is ordered today.  The ekg ordered today demonstrates normal sinus rhythm.  Poor R wave progression in the anterior leads.  Q waves in the inferior leads.  Recent Labs: 12/29/2018: B Natriuretic Peptide 208.8; Magnesium 2.4 01/18/2019: ALT 67; BUN 13; Creatinine 0.77; Hemoglobin 13.2; Platelet Count 166; Potassium 4.0; Sodium 137  Recent Lipid Panel    Component Value Date/Time   CHOL 108 10/16/2017 1435   TRIG 93 12/24/2018  1134   HDL 42 10/16/2017 1435   LDLCALC 55 10/16/2017 1435    Physical Exam:    VS:  BP 120/70   Pulse (!) 102   Ht 5\' 10"  (1.778 m)   Wt 200 lb 6.4 oz (90.9 kg)   SpO2 91%   BMI 28.75 kg/m     Wt Readings from Last 3 Encounters:  01/19/19 200 lb 6.4 oz (90.9 kg)  12/24/18 210 lb (95.3 kg)  08/27/18 215 lb 3.2 oz (97.6 kg)     GEN:  Well nourished, well developed in no acute distress HEENT: Normal NECK: No JVD; No carotid bruits LYMPHATICS: No lymphadenopathy CARDIAC: tachycardic, no murmurs, rubs, gallops RESPIRATORY:  Clear to auscultation without rales, wheezing or rhonchi  ABDOMEN: Soft, non-tender, non-distended MUSCULOSKELETAL:  No edema; No deformity  SKIN: Warm and dry NEUROLOGIC:  Alert and oriented x 3 PSYCHIATRIC:  Normal affect   ASSESSMENT:    1. Atrial flutter, unspecified type ([place])   2. Dyspnea, unspecified type   3. Coronary artery disease involving coronary bypass graft of native heart without angina pectoris   4. Chronic systolic CHF (congestive heart failure) ([place])   5. Essential hypertension   6. Hyperlipidemia LDL goal <70    PLAN:    In order of problems listed above:  1. Paroxysmal atrial flutter: Patient has converted back to sinus rhythm, however heart rate remains elevated.  Will increase Toprol-XL to 50 mg a.m. and 25 mg p.m.  Continue digoxin.  Obtain digoxin level.  2. Dyspnea: Related to recent Covid infection.  He is recovering slowly  3. CAD s/p CABG: Denies any recent or obvious anginal symptoms.   4. Chronic systolic heart failure: On Toprol[facility]XL at [facility]  5. Hypertension: Blood pressure stable  6. Hyperlipidemia: On Vytorin.   Medication Adjustments/Labs and Tests Ordered: Current medicines are reviewed at length with the patient today.  Concerns regarding medicines are outlined above.  Orders Placed This Encounter  Procedures  . Digoxin level  . EKG 12-Lead   Meds ordered this encounter  Medications  .  digoxin (LANOXIN) 0.125 MG tablet    Sig: Take 1 tablet (0.125 mg total) by mouth daily.    Dispense:  60 tablet    Refill:  0    Patient Instructions  Medication Instructions:   TAKE Metoprolol Succinate (Toprol XL) 1 tablet (50 mg) in the mornings and 1/2 tablet (25 mg) at night *If you need a refill on your cardiac medications before your next appointment, please call your pharmacy*  Lab Work: Your physician recommends that you return for lab work TODAY:  DIGOXIN LEVEL If you have labs (blood work) drawn today and your tests are completely normal, you will receive your results only by: [name] MyChart Message (if you have MyChart) OR . A paper copy in the mail If you have any lab test that is abnormal or we need to change your treatment, we will call you to review the results.  Testing/Procedures: NONE ordered at this time of appointment   Follow-Up: At [facility], you and your health needs are our priority.  As part of our continuing mission to provide you with exceptional heart care, we have created designated Provider Care Teams.  These Care Teams include your primary Cardiologist (physician) and Advanced Practice Providers (APPs -  Physician Assistants and Nurse Practitioners) who all work together to provide you with the care you need, when you need it.  Your next appointment:   2 month(s)  The format for your next appointment:   In Person  Provider:   [name], MD or Almyra Deforest, PA-C  Other Instructions  DO NOT TAKE any decongestants such as pseudoephedrine, phenylephrine, or Afrin   THESE MEDICATIONS ARE SAFE TO TAKE antihistamine such as Cetirizine (zyrtec) and loratidine are good for decongestion without side effect of vasocontriction  Expectorant such as Guaifenesin is safe  cough suppresant such as Dextromethorphan is safe  fever medication such as tylenol (acetamenophen) is safe     Hilbert Corrigan, [place]  01/21/2019 1:23 AM    [place]

## 2019-01-19 NOTE — Patient Instructions (Addendum)
Medication Instructions:   TAKE Metoprolol Succinate (Toprol XL) 1 tablet (50 mg) in the mornings and 1/2 tablet (25 mg) at night *If you need a refill on your cardiac medications before your next appointment, please call your pharmacy*  Lab Work: Your physician recommends that you return for lab work TODAY:  DIGOXIN LEVEL If you have labs (blood work) drawn today and your tests are completely normal, you will receive your results only by: [name] MyChart Message (if you have MyChart) OR . A paper copy in the mail If you have any lab test that is abnormal or we need to change your treatment, we will call you to review the results.  Testing/Procedures: NONE ordered at this time of appointment   Follow-Up: At [facility], you and your health needs are our priority.  As part of our continuing mission to provide you with exceptional heart care, we have created designated Provider Care Teams.  These Care Teams include your primary Cardiologist (physician) and Advanced Practice Providers (APPs -  Physician Assistants and Nurse Practitioners) who all work together to provide you with the care you need, when you need it.  Your next appointment:   2 month(s)  The format for your next appointment:   In Person  Provider:   [name], MD or Almyra Deforest, PA-C  Other Instructions  DO NOT TAKE any decongestants such as pseudoephedrine, phenylephrine, or Afrin   THESE MEDICATIONS ARE SAFE TO TAKE antihistamine such as Cetirizine (zyrtec) and loratidine are good for decongestion without side effect of vasocontriction  Expectorant such as Guaifenesin is safe  cough suppresant such as Dextromethorphan is safe  fever medication such as tylenol (acetamenophen) is safe

## 2019-01-20 ENCOUNTER — Telehealth: Payer: Self-pay

## 2019-01-20 DIAGNOSIS — R9389 Abnormal findings on diagnostic imaging of other specified body structures: Secondary | ICD-10-CM

## 2019-01-20 LAB — DIGOXIN LEVEL: Digoxin, Serum: 0.5 ng/mL (ref 0.5–0.9)

## 2019-01-20 NOTE — Telephone Encounter (Signed)
MD reviewed labs/CXR results.  Pt notified of results.  Per MD recommendations pulmonology consult needed.  Pt in agreement.  RN placed referral.

## 2019-01-21 ENCOUNTER — Encounter: Payer: Self-pay | Admitting: Physician Assistant

## 2019-01-26 ENCOUNTER — Other Ambulatory Visit: Payer: Self-pay | Admitting: Cardiology

## 2019-01-26 ENCOUNTER — Telehealth: Payer: Self-pay

## 2019-01-26 MED ORDER — DIGOXIN 125 MCG PO TABS: 0.1250 mg | ORAL_TABLET | Freq: Every day | ORAL | 1 refills | Status: DC

## 2019-01-26 MED ORDER — METOPROLOL SUCCINATE ER 50 MG PO TB24: ORAL_TABLET | ORAL | 3 refills | Status: DC

## 2019-01-26 NOTE — Telephone Encounter (Addendum)
Left a voice message for the patient to give our office a call for his recent lab results.  ----- Message from [place], [place] sent at 01/21/2019  8:33 AM EST ----- Digoxin level is lower border of normal, but still consider therapeutic. No sign of digoxin toxicity, will continue on current dose.

## 2019-01-27 ENCOUNTER — Other Ambulatory Visit: Payer: Self-pay

## 2019-01-27 MED ORDER — DIGOXIN 125 MCG PO TABS: 0.1250 mg | ORAL_TABLET | Freq: Every day | ORAL | 1 refills | Status: DC

## 2019-01-27 MED ORDER — EZETIMIBE-SIMVASTATIN 10-20 MG PO TABS: 1.0000 | ORAL_TABLET | Freq: Every day | ORAL | 3 refills | Status: DC

## 2019-01-27 MED ORDER — METOPROLOL SUCCINATE ER 50 MG PO TB24: ORAL_TABLET | ORAL | 3 refills | Status: DC

## 2019-01-28 ENCOUNTER — Encounter: Payer: Self-pay | Admitting: Pulmonary Disease

## 2019-01-28 ENCOUNTER — Ambulatory Visit (INDEPENDENT_AMBULATORY_CARE_PROVIDER_SITE_OTHER): Payer: Medicare Other | Admitting: Pulmonary Disease

## 2019-01-28 ENCOUNTER — Other Ambulatory Visit: Payer: Self-pay

## 2019-01-28 VITALS — BP 112/62 | HR 82 | Temp 97.7°F | Ht 70.0 in | Wt 199.2 lb

## 2019-01-28 DIAGNOSIS — R0602 Shortness of breath: Secondary | ICD-10-CM

## 2019-01-28 DIAGNOSIS — J1282 Pneumonia due to coronavirus disease 2019: Secondary | ICD-10-CM | POA: Diagnosis not present

## 2019-01-28 DIAGNOSIS — U071 COVID-19: Secondary | ICD-10-CM

## 2019-01-28 MED ORDER — ANORO ELLIPTA 62.5-25 MCG/INH IN AEPB: 1.0000 | INHALATION_SPRAY | Freq: Every day | RESPIRATORY_TRACT | 0 refills | Status: DC

## 2019-01-28 MED ORDER — PREDNISONE 20 MG PO TABS: 20.0000 mg | ORAL_TABLET | Freq: Every day | ORAL | 0 refills | Status: DC

## 2019-01-28 MED ORDER — ANORO ELLIPTA 62.5-25 MCG/INH IN AEPB: 1.0000 | INHALATION_SPRAY | Freq: Every day | RESPIRATORY_TRACT | 3 refills | Status: DC

## 2019-01-28 NOTE — Patient Instructions (Addendum)
Will schedule you for high-resolution CT and pulmonary function Start you on Anoro inhaler Was started on prednisone 20 mg a day.  Continue this until he can be seen back in clinic in 2 to 4 weeks Continue to work on an exercise regimen and try to stay active Continue supplemental oxygen

## 2019-01-28 NOTE — Progress Notes (Signed)
Rodney Burns    [id number]    1945-09-27  Primary Care Physician:Little, Lennette Bihari, MD  Referring Physician: Nicholas Lose, MD [address] [place],  [place] [address]  Chief complaint: Follow-up for post COVID-19 pneumonia  HPI: 74 y.o.malewith PMHx ofCLL, chronic atrial fibrillation-status post ablation-on Xarelto, history of CAD-s/p CABG Q000111Q, chronic systolic heart failure, HTN, HLD.    Specialized from 12/3 to 12/30/2018 with COVID-19 pneumonia.  He was treated with steroids and convalescent plasma.  He did not get remdesivir or Actemra due to severe transaminitis.  Discharged on 2 L oxygen Continues to have significant dyspnea on exertion.  Follow-up chest x-ray 12/28 with worsening bilateral infiltrates and was referred to pulmonary for further evaluation.  He has history of CLL, currently on monitoring under the care of Dr. 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  No mold, hot tub, Jacuzzi.  No feather pillows or comforters  Smoking history: 60-pack-year smoker.  Quit in 1994 Travel history: No significant recent travel history Relevant family history: No significant family history of lung disease  Outpatient Encounter Medications as of 01/28/2019  Medication Sig  . albuterol (VENTOLIN HFA) 108 (90 Base) MCG/ACT inhaler Inhale 1 puff into the lungs every 6 (six) hours as needed for wheezing or shortness of breath.  . Coenzyme Q10 (COQ-10) 200 MG CAPS Take 200 mg by mouth daily.  . digoxin (LANOXIN) 0.125 MG tablet Take 1 tablet (0.125 mg total) by mouth daily.  [name] Calcium (STOOL SOFTENER PO) Take by mouth 2 (two) times daily.  [name] ENTRESTO 49-51 MG TAKE 1 TABLET TWICE A DAY  . ezetimibe-simvastatin (VYTORIN) 10-20 MG tablet Take 1 tablet by mouth at bedtime.  . Fiber, Guar Gum, CHEW Chew 3 capsules by mouth daily.   [name] latanoprost (XALATAN) 0.005 % ophthalmic solution  Place 1 drop into both eyes at bedtime.   . Magnesium 250 MG TABS Take 500 mg by mouth at bedtime.  . metoprolol succinate (TOPROL-XL) 50 MG 24 hr tablet Take 1 tablet (50 mg) in the mornings and half tablet (25 mg) in the evenings.  . pramipexole (MIRAPEX) 0.5 MG tablet Take 1.5 mg by mouth daily.   . RABEprazole (ACIPHEX) 20 MG tablet Take 1 tablet (20 mg total) by mouth daily.  . traMADol (ULTRAM) 50 MG tablet TAKE 1 TABLET EVERY 6 HOURS AS NEEDED FOR MODERATE PAIN  . XARELTO 20 MG TABS tablet TAKE 1 TABLET DAILY WITH SUPPER (Patient taking differently: Take 20 mg by mouth daily with supper. )   No facility-administered encounter medications on file as of 01/28/2019.    Allergies as of 01/28/2019 - Review Complete 01/28/2019  Allergen Reaction Noted  . Penicillins Other (See Comments) 08/16/2010  . Ace inhibitors Cough 08/16/2010  . Darvocet [propoxyphene n-acetaminophen] Rash 08/16/2010  . Sulfa antibiotics Rash 09/02/2011    Past Medical History:  Diagnosis Date  . Atrial tachycardia ([place])    a. s/p DCCV 09/2009; b. s/p RFCA 03/2011; c. s/p DCCV 04/2011; d. s/p RFCA 09/17/11.  . Cataract   . Chronic systolic CHF (congestive heart failure) ([place])    a. 01/2015 Echo: EF 40-45%, antsept/infsept HK, triv AI, mild MR, mod dil LA, nl RV, mildly dil RA.  [name] CLL (chronic lymphoblastic leukemia)    Stage 0-1  . Coronary artery disease involving native coronary artery without angina pectoris    a. Status post CABG  in 1994:By Dr. Cyndia Bent. LIMA - L Cx, seqSVG-Diag-LAD, and SVG-PDA-PL.  [name] Cough    Consistant with ACE inhibitor mediated cough  . Dysrhythmia   . Glaucoma (increased eye pressure) 1991  . History of tobacco abuse    quit 1994  . Hypercholesterolemia    Well Controlled  . Hypertension   . Ischemic cardiomyopathy    a. 02/2011 Echo: EF 40-45%;  b. 01/2015 Echo: EF 40-45%.  . Malaria 1972   Hx of  . Pericarditis    a. 01/2015-->Treated w/ ibuprofen/colchicine.  . Sleep-disordered  breathing 06/20/2011    Past Surgical History:  Procedure Laterality Date  . ATRIAL FLUTTER ABLATION N/A 04/11/2011   Procedure: ATRIAL FLUTTER ABLATION;  Surgeon: Deboraha Sprang, MD;  Location: [facility] CATH LAB;  Service: Cardiovascular;  Laterality: N/A;  . ATRIAL FLUTTER ABLATION N/A 09/17/2011   Procedure: ATRIAL FLUTTER ABLATION;  Surgeon: Thompson Grayer, MD;  Location[facility] CATH LAB;  Service: Cardiovascular;  Laterality: N/A;  . CARDIOVERSION  06/03/2011   Procedure: CARDIOVERSION;  Surgeon: Deboraha Sprang, MD;  Location: [facility];  Service: Cardiovascular;  Laterality: N/A;  . CATARACT EXTRACTION    . CORONARY ARTERY BYPASS GRAFT  1994   By Dr. Cyndia Bent. LIMA - L Cx, seqSVG-Diag-LAD, and SVG-PDA-PL  . ELECTROPHYSIOLOGY STUDY N/A 04/11/2011   Procedure: ELECTROPHYSIOLOGY STUDY;  Surgeon: Deboraha Sprang, MD;  Location: [facility] CATH LAB;  Service: Cardiovascular;  Laterality: N/A;  . EP study and ablation  2013   by Dr Caryl Comes, repeat ablation by Dr Rayann Heman  . NM MYOVIEW LTD  03/2014   scar in the inferior and anteroapical regions without ischemia. This is similar to finding on Myoview in 2013. EF is a little lower 39%>>32%.  [name] TRIGGER FINGER RELEASE  12/2016   left hand   . US ECHOCARDIOGRAPHY  09-07-09; 02/2011   a. Est EF 40-45%; b. EF 40-45%. Gr 2 DD. Mild LA dil    Family History  Problem Relation Age of Onset  . Heart failure Father   . Cancer Brother        colon  . Down syndrome Son     Social History   Socioeconomic History  . Marital status: Married    Spouse name: Not on file  . Number of children: 2  . Years of education: Not on file  . Highest education level: Not on file  Occupational History  . Occupation: Careers adviser: RETIRED  Tobacco Use  . Smoking status: Former Smoker    Packs/day: 2.00    Years: 30.00    Pack years: 60.00    Types: Cigarettes    Quit date: 01/22/1992    Years since quitting: 27.0  . Smokeless tobacco: Never Used  Substance and Sexual  Activity  . Alcohol use: No  . Drug use: No  . Sexual activity: Yes  Other Topics Concern  . Not on file  Social History Narrative  . Not on file   Social Determinants of Health   Financial Resource Strain:   . Difficulty of Paying Living Expenses: Not on file  Food Insecurity:   . Worried About Charity fundraiser in the Last Year: Not on file  . Ran Out of Food in the Last Year: Not on file  Transportation Needs:   . Lack of Transportation (Medical): Not on file  . Lack of Transportation (Non-Medical): Not on file  Physical Activity:   . Days of Exercise per Week: Not  on file  . Minutes of Exercise per Session: Not on file  Stress:   . Feeling of Stress : Not on file  Social Connections:   . Frequency of Communication with Friends and Family: Not on file  . Frequency of Social Gatherings with Friends and Family: Not on file  . Attends Religious Services: Not on file  . Active Member of Clubs or Organizations: Not on file  . Attends Archivist Meetings: Not on file  . Marital Status: Not on file  Intimate Partner Violence:   . Fear of Current or Ex-Partner: Not on file  . Emotionally Abused: Not on file  . Physically Abused: Not on file  . Sexually Abused: Not on file    Review of systems: Review of Systems  Constitutional: Negative for fever and chills.  HENT: Negative.   Eyes: Negative for blurred vision.  Respiratory: as per HPI  Cardiovascular: Negative for chest pain and palpitations.  Gastrointestinal: Negative for vomiting, diarrhea, blood per rectum. Genitourinary: Negative for dysuria, urgency, frequency and hematuria.  Musculoskeletal: Negative for myalgias, back pain and joint pain.  Skin: Negative for itching and rash.  Neurological: Negative for dizziness, tremors, focal weakness, seizures and loss of consciousness.  Endo/Heme/Allergies: Negative for environmental allergies.  Psychiatric/Behavioral: Negative for depression, suicidal ideas and  hallucinations.  All other systems reviewed and are negative.  Physical Exam: Blood pressure 112/62, pulse 82, temperature 97.7 F (36.5 C), temperature source Temporal, height 5\' 10"  (1.778 m), weight 199 lb 3.2 oz (90.4 kg), SpO2 93 %. Gen:      No acute distress HEENT:  EOMI, sclera anicteric Neck:     No masses; no thyromegaly Lungs:    Faint basilar crackles CV:         Regular rate and rhythm; no murmurs Abd:      + bowel sounds; soft, non-tender; no palpable masses, no distension Ext:    No edema; adequate peripheral perfusion Skin:      Warm and dry; no rash Neuro: alert and oriented x 3 Psych: normal mood and affect  Data Reviewed: Imaging: Chest x-ray 12/26/2018-bilateral interstitial prominence. Chest x-ray 01/18/2019-progression of reticulonodular opacities. I have reviewed images personally  Assessment:  Post COVID-19 pneumonia He continues to have persistent symptoms.  Recent chest x-ray reviewed with mild increase in bilateral opacities.  Clincially he does not appear to have an infectious pneumonia.  This could be persistent pneumonitis from COVID-19 We will get a high-resolution CT for further evaluation Start prednisone 20 mg a day  Ex-smoker Schedule pulmonary function test Trial of Anoro inhaler.  Plan/Recommendations: - High-resolution CT, PFTs - Prednisone 20 mg a day - Anoro inhaler - Continue supplemental oxygen  This appointment required 42 minutes of patient care (this includes precharting, chart review, review of results, face-to-face care, etc.).  Marshell Garfinkel MD [facility] 01/28/2019, 4:14 PM  CC: Nicholas Lose, MD

## 2019-01-31 ENCOUNTER — Other Ambulatory Visit: Payer: Self-pay | Admitting: Cardiology

## 2019-02-01 NOTE — Telephone Encounter (Signed)
73 M  90.4 kg, SCr 0.77,  CrCl 107.6

## 2019-02-02 ENCOUNTER — Telehealth: Payer: Self-pay | Admitting: Cardiology

## 2019-02-02 NOTE — Telephone Encounter (Signed)
Patient returning call in regards to lab results. See last telephone note.

## 2019-02-02 NOTE — Telephone Encounter (Signed)
Pt advised his Digoxin level from 01/18/20.

## 2019-02-03 ENCOUNTER — Ambulatory Visit (HOSPITAL_COMMUNITY)
Admission: RE | Admit: 2019-02-03 | Discharge: 2019-02-03 | Disposition: A | Discharge status: Home / Self Care | Payer: Medicare Other | Source: Ambulatory Visit | Attending: Pulmonary Disease | Admitting: Pulmonary Disease

## 2019-02-03 ENCOUNTER — Other Ambulatory Visit: Payer: Self-pay

## 2019-02-03 DIAGNOSIS — U071 COVID-19: Secondary | ICD-10-CM | POA: Diagnosis not present

## 2019-02-03 DIAGNOSIS — R0602 Shortness of breath: Secondary | ICD-10-CM | POA: Diagnosis not present

## 2019-02-03 DIAGNOSIS — J1282 Pneumonia due to coronavirus disease 2019: Secondary | ICD-10-CM | POA: Diagnosis not present

## 2019-02-10 ENCOUNTER — Encounter: Payer: Self-pay | Admitting: Pulmonary Disease

## 2019-02-10 ENCOUNTER — Ambulatory Visit (INDEPENDENT_AMBULATORY_CARE_PROVIDER_SITE_OTHER): Payer: Medicare Other | Admitting: Pulmonary Disease

## 2019-02-10 ENCOUNTER — Other Ambulatory Visit: Payer: Self-pay

## 2019-02-10 DIAGNOSIS — Z09 Encounter for follow-up examination after completed treatment for conditions other than malignant neoplasm: Secondary | ICD-10-CM

## 2019-02-10 DIAGNOSIS — R0600 Dyspnea, unspecified: Secondary | ICD-10-CM | POA: Diagnosis not present

## 2019-02-10 DIAGNOSIS — J1282 Pneumonia due to coronavirus disease 2019: Secondary | ICD-10-CM

## 2019-02-10 DIAGNOSIS — Z7901 Long term (current) use of anticoagulants: Secondary | ICD-10-CM | POA: Diagnosis not present

## 2019-02-10 DIAGNOSIS — Z9981 Dependence on supplemental oxygen: Secondary | ICD-10-CM | POA: Diagnosis not present

## 2019-02-10 DIAGNOSIS — Z8616 Personal history of COVID-19: Secondary | ICD-10-CM

## 2019-02-10 DIAGNOSIS — Z7952 Long term (current) use of systemic steroids: Secondary | ICD-10-CM | POA: Diagnosis not present

## 2019-02-10 DIAGNOSIS — Z951 Presence of aortocoronary bypass graft: Secondary | ICD-10-CM | POA: Diagnosis not present

## 2019-02-10 DIAGNOSIS — R0602 Shortness of breath: Secondary | ICD-10-CM

## 2019-02-10 DIAGNOSIS — U071 COVID-19: Secondary | ICD-10-CM

## 2019-02-10 MED ORDER — PREDNISONE 5 MG PO TABS: ORAL_TABLET | ORAL | 1 refills | Status: DC

## 2019-02-10 MED ORDER — ANORO ELLIPTA 62.5-25 MCG/INH IN AEPB: 1.0000 | INHALATION_SPRAY | Freq: Every day | RESPIRATORY_TRACT | 3 refills | Status: DC

## 2019-02-10 NOTE — Progress Notes (Signed)
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    Specialized from 12/3 to 12/30/2018 with COVID-19 pneumonia.  He was treated with steroids and convalescent plasma.  He did not get remdesivir or Actemra due to severe transaminitis.  Discharged on 2 L oxygen Continues to have significant dyspnea on exertion.  Follow-up chest x-ray 12/28 with worsening bilateral infiltrates and was referred to pulmonary for further evaluation.  He has history of CLL, currently on monitoring under the care of Dr. 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  No mold, hot tub, Jacuzzi.  No feather pillows or comforters  Smoking history: 60-pack-year smoker.  Quit in 1994 Travel history: No significant recent travel history Relevant family history: No significant family history of lung disease  Outpatient Encounter Medications as of 02/10/2019  Medication Sig  . albuterol (VENTOLIN HFA) 108 (90 Base) MCG/ACT inhaler Inhale 1 puff into the lungs every 6 (six) hours as needed for wheezing or shortness of breath.  . Coenzyme Q10 (COQ-10) 200 MG CAPS Take 200 mg by mouth daily.  . digoxin (LANOXIN) 0.125 MG tablet Take 1 tablet (0.125 mg total) by mouth daily.  [name] Calcium (STOOL SOFTENER PO) Take by mouth 2 (two) times daily.  [name] ENTRESTO 49-51 MG TAKE 1 TABLET TWICE A DAY  . ezetimibe-simvastatin (VYTORIN) 10-20 MG tablet Take 1 tablet by mouth at bedtime.  . Fiber, Guar Gum, CHEW Chew 3 capsules by mouth daily.   [name] latanoprost (XALATAN) 0.005 % ophthalmic solution  Place 1 drop into both eyes at bedtime.   . Magnesium 250 MG TABS Take 500 mg by mouth at bedtime.  . metoprolol succinate (TOPROL-XL) 50 MG 24 hr tablet Take 1 tablet (50 mg) in the mornings and half tablet (25 mg) in the evenings.  . pramipexole (MIRAPEX) 0.5 MG tablet Take 1.5 mg by mouth daily.   . predniSONE (DELTASONE) 20 MG tablet Take 1 tablet (20 mg total) by mouth daily with breakfast.  . RABEprazole (ACIPHEX) 20 MG tablet Take 1 tablet (20 mg total) by mouth daily.  . traMADol (ULTRAM) 50 MG tablet TAKE 1 TABLET EVERY 6 HOURS AS NEEDED FOR MODERATE PAIN  . umeclidinium-vilanterol (ANORO ELLIPTA) 62.5-25 MCG/INH AEPB Inhale 1 puff into the lungs daily.  [name] 20 MG TABS tablet TAKE 1 TABLET DAILY WITH SUPPER  . [DISCONTINUED] umeclidinium-vilanterol (ANORO ELLIPTA) 62.5-25 MCG/INH AEPB Inhale 1 puff into the lungs daily.   No facility-administered encounter medications on file as of 02/10/2019.   Physical Exam: Blood pressure 112/62, pulse 82, temperature 97.7 F (36.5 C), temperature source Temporal, height 5\' 10"  (1.778 m), weight 199 lb 3.2 oz (90.4 kg), SpO2 93 %. Gen:      No acute distress HEENT:  EOMI, sclera anicteric Neck:     No masses; no thyromegaly Lungs:    Faint basilar crackles CV:         Regular rate and rhythm; no murmurs Abd:      + bowel sounds; soft, non-tender; no palpable masses, no distension Ext:    No edema; adequate peripheral  perfusion Skin:      Warm and dry; no rash Neuro: alert and oriented x 3 Psych: normal mood and affect  Data Reviewed: Imaging: Chest x-ray 12/26/2018-bilateral interstitial prominence. Chest x-ray 01/18/2019-progression of reticulonodular opacities. High-resolution CT 02/03/2019-moderate pulmonary fibrosis with basal pattern with septal thickening, mild traction bronchiectasis.  No honeycombing.  Probable UIP I have reviewed the images personally.  mMRC 02/10/2019- 3  Assessment:  Post COVID-19 pneumonia CT reviewed  with probable UIP pattern pulmonary fibrosis Symptoms slightly improved. Taper prednisone to 10 mg/day and will continue slow reduction in dose  Discussed CT results with patient and told him to expect along symptoms with very slow improvement in symptoms Await PFTs Refer to pulmonary rehab Check O2 levels on exertion and at night.  Ex-smoker Await PFT Continue Anoro  Plan/Recommendations: - PFTs - Reduce prednisone to 10 mg a day - Anoro inhaler - Continue supplemental oxygen  This appointment required 40 minutes of patient care (this includes precharting, chart review, review of results, face-to-face care, etc.).  Marshell Garfinkel MD [facility] 02/10/2019, 12:00 PM  CC: Hulan Fess, MD

## 2019-02-10 NOTE — Patient Instructions (Signed)
We will check your oxygen levels on exertion and also nocturnal oximetry on room air We will reduce prednisone to 10 mg a day and call in refills for that We will refer you to pulmonary rehab Continue the Anoro inhaler  Follow-up in 4 weeks.

## 2019-02-11 ENCOUNTER — Other Ambulatory Visit: Payer: Self-pay | Admitting: Cardiology

## 2019-02-11 NOTE — Telephone Encounter (Signed)
*  STAT* If patient is at the pharmacy, call can be transferred to refill team.   1. Which medications need to be refilled? (please list name of each medication and dose if known) Vytorin   2. Which pharmacy/location (including street and city if local pharmacy) is medication to be sent to? [facility]  3. Do they need a 30 day or 90 day supply? Patient need 90 day instead of 30. Please call to be refill

## 2019-02-12 ENCOUNTER — Other Ambulatory Visit: Payer: Self-pay | Admitting: Hematology and Oncology

## 2019-02-12 DIAGNOSIS — I2510 Atherosclerotic heart disease of native coronary artery without angina pectoris: Secondary | ICD-10-CM | POA: Diagnosis not present

## 2019-02-12 DIAGNOSIS — E785 Hyperlipidemia, unspecified: Secondary | ICD-10-CM | POA: Diagnosis not present

## 2019-02-12 DIAGNOSIS — C9110 Chronic lymphocytic leukemia of B-cell type not having achieved remission: Secondary | ICD-10-CM

## 2019-02-12 DIAGNOSIS — I5020 Unspecified systolic (congestive) heart failure: Secondary | ICD-10-CM | POA: Diagnosis not present

## 2019-02-12 DIAGNOSIS — I10 Essential (primary) hypertension: Secondary | ICD-10-CM | POA: Diagnosis not present

## 2019-02-12 MED ORDER — EZETIMIBE-SIMVASTATIN 10-20 MG PO TABS: 1.0000 | ORAL_TABLET | Freq: Every day | ORAL | 3 refills | Status: DC

## 2019-02-12 NOTE — Telephone Encounter (Signed)
Attempted to contact [facility] but no answer. Rx resent to pharmacy.

## 2019-02-12 NOTE — Telephone Encounter (Signed)
Pt's medication was already sent to pt's preferred pharmacy.

## 2019-02-16 ENCOUNTER — Encounter: Payer: Self-pay | Admitting: Pulmonary Disease

## 2019-02-16 DIAGNOSIS — R0902 Hypoxemia: Secondary | ICD-10-CM | POA: Diagnosis not present

## 2019-02-16 DIAGNOSIS — J449 Chronic obstructive pulmonary disease, unspecified: Secondary | ICD-10-CM | POA: Diagnosis not present

## 2019-02-25 ENCOUNTER — Telehealth: Payer: Self-pay | Admitting: Pulmonary Disease

## 2019-02-25 NOTE — Telephone Encounter (Signed)
Called and spoke to pt. Pt states he completed his ONO on 02/16/19 and is requesting the results. Per pt's chart, he had the ONO with [employer]. Called Melissa with [employer] and left a detailed message to fax [place] those results to Erica's attention. Will forward to [place] to look out for.

## 2019-02-26 ENCOUNTER — Other Ambulatory Visit: Payer: Self-pay

## 2019-03-01 NOTE — Telephone Encounter (Signed)
Yes. I have received the ONO results. They are in Dr. Matilde Bash box. He will return to the office on 03/15/19. He will review then.

## 2019-03-01 NOTE — Telephone Encounter (Signed)
Danae Chen have you received the results?

## 2019-03-04 ENCOUNTER — Other Ambulatory Visit: Payer: Self-pay | Admitting: Hematology and Oncology

## 2019-03-04 DIAGNOSIS — C9110 Chronic lymphocytic leukemia of B-cell type not having achieved remission: Secondary | ICD-10-CM

## 2019-03-04 NOTE — Telephone Encounter (Signed)
Please cancel refill, it is too soon to refill this script.  Last called in- 2 weeks ago.  I am not able to deny it on my end anymore.  Thanks

## 2019-03-09 DIAGNOSIS — H401131 Primary open-angle glaucoma, bilateral, mild stage: Secondary | ICD-10-CM | POA: Diagnosis not present

## 2019-03-10 NOTE — Telephone Encounter (Signed)
Left detailed message on named voicemail (dpr on file) updating patient Dr [name] is back in the office next week and should be able to review results then

## 2019-03-16 ENCOUNTER — Encounter (HOSPITAL_COMMUNITY): Payer: Self-pay | Admitting: *Deleted

## 2019-03-16 ENCOUNTER — Ambulatory Visit (INDEPENDENT_AMBULATORY_CARE_PROVIDER_SITE_OTHER): Payer: Medicare Other | Admitting: Pulmonary Disease

## 2019-03-16 ENCOUNTER — Encounter: Payer: Self-pay | Admitting: Pulmonary Disease

## 2019-03-16 ENCOUNTER — Other Ambulatory Visit: Payer: Self-pay

## 2019-03-16 VITALS — BP 118/70 | HR 80 | Temp 98.0°F | Wt 216.0 lb

## 2019-03-16 DIAGNOSIS — U071 COVID-19: Secondary | ICD-10-CM | POA: Diagnosis not present

## 2019-03-16 DIAGNOSIS — R0602 Shortness of breath: Secondary | ICD-10-CM | POA: Diagnosis not present

## 2019-03-16 DIAGNOSIS — J1282 Pneumonia due to coronavirus disease 2019: Secondary | ICD-10-CM

## 2019-03-16 DIAGNOSIS — G4730 Sleep apnea, unspecified: Secondary | ICD-10-CM

## 2019-03-16 NOTE — Telephone Encounter (Signed)
Patient was seen in the office today and given ONO results. I have sent an order to his DME company for 2L of nocturnal oxygen in addition to his PRN dose daily.

## 2019-03-16 NOTE — Progress Notes (Signed)
Received referral from Dr. [name] for this pt to participate in pulmonary rehab with the the diagnosis of Dyspnea with Pneumonia due to Covid 19.  Pt hospitalized at [facility] from 12/3-12/9. Clinical review of pt follow up appt on 03/16/19 Pulmonary office note.  Pt with Covid Risk Score - 7. Pt appropriate for scheduling for Pulmonary rehab.  Will forward to support staff insurance verification and to pulmonary rehab staff  for scheduling. Cherre Huger, BSN Cardiac and Training and development officer

## 2019-03-16 NOTE — Patient Instructions (Signed)
Reduce prednisone to 5 mg a day for 4 weeks and then stop Continue supplemental oxygen during exertion.  Based on your overnight oximetry we will start oxygen 2 L at night as well We will schedule you for an in lab sleep study for reevaluation of sleep apnea Continue incentive spirometer, flutter valve and exercise regimen at home We will make referral to pulmonary rehab  Follow-up in 3 months.

## 2019-03-16 NOTE — Telephone Encounter (Signed)
Erica, please advise if Dr. Mannam reviewed this. Thanks.  

## 2019-03-16 NOTE — Progress Notes (Signed)
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    Specialized from 12/3 to 12/30/2018 with COVID-19 pneumonia.  He was treated with steroids and convalescent plasma.  He did not get remdesivir or Actemra due to severe transaminitis.  Discharged on 2 L oxygen Continues to have significant dyspnea on exertion.  Follow-up chest x-ray 12/28 with worsening bilateral infiltrates and was referred to pulmonary for further evaluation.  He has history of CLL, currently on monitoring under the care of Dr. 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  No mold, hot tub, Jacuzzi.  No feather pillows or comforters  Smoking history: 60-pack-year smoker.  Quit in 1994 Travel history: No significant recent travel history Relevant family history: No significant family history of lung disease  Interim history: Continues to have dyspnea and fatigue with mild improvement Sleeping better at night.  No new complaints today  Outpatient Encounter Medications as of 03/16/2019  Medication Sig  . albuterol (VENTOLIN HFA) 108 (90 Base) MCG/ACT inhaler Inhale 1 puff into the lungs every 6 (six) hours as needed for wheezing or shortness of breath.  . digoxin (LANOXIN) 0.125 MG tablet Take 1 tablet (0.125 mg total) by mouth daily.  [name] Calcium (STOOL SOFTENER PO) Take by mouth 2 (two) times daily.  [name] ENTRESTO 49-51 MG TAKE 1 TABLET TWICE A DAY  . ezetimibe-simvastatin (VYTORIN) 10-20 MG tablet Take 1 tablet by mouth at bedtime.  . Fiber, Guar Gum, CHEW Chew 3 capsules by  mouth daily.   [name] latanoprost (XALATAN) 0.005 % ophthalmic solution Place 1 drop into both eyes at bedtime.   . Magnesium 250 MG TABS Take 500 mg by mouth at bedtime.  . metoprolol succinate (TOPROL-XL) 50 MG 24 hr tablet Take 1 tablet (50 mg) in the mornings and half tablet (25 mg) in the evenings.  . pramipexole (MIRAPEX) 0.5 MG tablet Take 1.5 mg by mouth daily.   . predniSONE (DELTASONE) 5 MG tablet Take 10mg  daily  . RABEprazole (ACIPHEX) 20 MG tablet Take 1 tablet (20 mg total) by mouth daily.  . traMADol (ULTRAM) 50 MG tablet TAKE 1 TABLET EVERY 6 HOURS AS NEEDED FOR MODERATE PAIN  . umeclidinium-vilanterol (ANORO ELLIPTA) 62.5-25 MCG/INH AEPB Inhale 1 puff into the lungs daily.  [name] 20 MG TABS tablet TAKE 1 TABLET DAILY WITH SUPPER  . [DISCONTINUED] Coenzyme Q10 (COQ-10) 200 MG CAPS Take 200 mg by mouth daily.   No facility-administered encounter medications on file as of 03/16/2019.   Physical Exam: Blood pressure 112/62, pulse 82, temperature 97.7 F (36.5 C), temperature source Temporal, height 5\' 10"  (1.778 m), weight 199 lb 3.2 oz (90.4 kg), SpO2 93 %. Gen:      No acute distress HEENT:  EOMI, sclera anicteric Neck:     No masses; no thyromegaly Lungs:    Faint basilar crackles CV:         Regular rate and rhythm; no murmurs Abd:      + bowel sounds; soft, non-tender; no palpable masses, no distension Ext:    No edema; adequate peripheral perfusion Skin:  Warm and dry; no rash Neuro: alert and oriented x 3 Psych: normal mood and affect  Data Reviewed: Imaging: Chest x-ray 12/26/2018-bilateral interstitial prominence. Chest x-ray 01/18/2019-progression of reticulonodular opacities. High-resolution CT 02/03/2019-moderate pulmonary fibrosis with basal pattern with septal thickening, mild traction bronchiectasis.  No honeycombing.  Probable UIP I have reviewed the images personally.  mMRC  02/10/2019- 3 03/16/2019-3  Sleep: PSG 08/03/2011 Very mild OSA with AHI 9,  desats to 86%, significant leg movements suspicious for PLM  Overnight oximetry 02/16/2019 Time less than 88% - 15 minutes, 32 seconds Nadir O2 sat of 83%. Oxygen desaturation index 27.5  Assessment:  Post COVID-19 pneumonia CT reviewed with probable UIP pattern pulmonary fibrosis Continues to have slow improvement On prednisone taper.  At 10 mg/day now.  Will reduce dose to 5 mg/day for 1 more month and then stop  Discussed  with patient and wife and told him to expect along symptoms with very slow improvement in symptoms Await PFTs Has been referred to pulmonary rehab Continue supplemental oxygen on exertion and at night  Nocturnal hypoxia He previously had sleep study in 2013 showing PLM for which he is on Mirapex He will need repeat sleep study as he has periodic desats on overnight oximetry suggestive of sleep apnea  Ex-smoker Await PFT Continue Anoro  Plan/Recommendations: - PFTs - Reduce prednisone to 5 mg a day - Anoro inhaler - Continue supplemental oxygen - Sleep study  This appointment required 40 minutes of patient care (this includes precharting, chart review, review of results, face-to-face care, etc.).  Marshell Garfinkel MD [facility] 03/16/2019, 12:11 PM  CC: Hulan Fess, MD

## 2019-03-17 ENCOUNTER — Telehealth: Payer: Self-pay | Admitting: Pulmonary Disease

## 2019-03-17 NOTE — Telephone Encounter (Signed)
[facility]. Directions to take 10 mg total daily, dispense of 180.  Based on his office visit with Dr. [name] on 03/17/19 the patient was directed to take 5 mg daily.   LVMTCB x 1 for patient to call back to confirm he received the prescription that was [facility] to [facility] on [facility]/10/19.  Rep [facility] transferred me to [name] (within [facility]) and the rep confirmed they did send the prescription to the patient and he should have 56 days left of the medication and there is a refill remaining.  There is nothing further needed at this time.

## 2019-03-17 NOTE — Telephone Encounter (Signed)
Patient is returning phone call.  Patient phone number is [phone].

## 2019-03-17 NOTE — Telephone Encounter (Signed)
The patient called back and stated he never received the prescription issued 02/10/19. I advised he contact [facility] to make them aware of this and ask for a tracking number. I told the patient the prescription was sent with 1 refill and a dispense of 180 for the 5 mg tablet so there would have been enough to cover his needs based on what Dr. [name] noted on the visit note for yesterday.  The patient stated he will call [employer] to find out what happened as he is running low on the prednisone.

## 2019-03-17 NOTE — Telephone Encounter (Signed)
Patient is returning phone call.  Patient phone number is [phone].

## 2019-03-17 NOTE — Telephone Encounter (Signed)
ATC patient.  Left message to call back. 

## 2019-03-17 NOTE — Telephone Encounter (Signed)
Called and spoke with pt. Pt stated he was able to find the remainder of the pred he had at home so has enough of it now. Nothing further needed.

## 2019-03-18 ENCOUNTER — Telehealth (HOSPITAL_COMMUNITY): Payer: Self-pay

## 2019-03-18 NOTE — Progress Notes (Signed)
Cardiology Office Note:    Date:  03/22/2019   ID:  Rodney Burns, DOB 12/22/1945, MRN [id number]  PCP:  Hulan Fess, MD  Cardiologist:  Monserratt Knezevic Martinique, MD  Electrophysiologist:  None   Referring MD: Hulan Fess, MD   Chief Complaint  Patient presents with  . Shortness of Breath    History of Present Illness:    Rodney Burns is a 74 y.o. male with a hx of atrial flutter, CAD, chronic systolic heart failure, CLL, hypertension, hyperlipidemia, TAA, and history of pericarditis in January 2017.  Patient has a history of CAD s/p CABG in 1994.  He had radiofrequency ablation in 2013.  Unfortunately he had recurrent atrial flutter after the ablation.  Myoview in March 2016 showed inferior and anteroapical infarct without evidence of ischemia, EF 32%.  Echocardiogram obtained during diagnosis of pericarditis in January 2017 was unremarkable.  He was treated with ibuprofen and colchicine at the time and has not had any recurrent chest discomfort.  He was last seen by Dr. Martinique in September 2019, at which time he gained 3 pounds.  He was instructed to take Lasix 20 mg daily for 4 days.  Repeat echocardiogram obtained on 10/21/2017 showed EF 40 to 45%, hypokinesis of the anteroseptal and inferoseptal myocardium, grade 1 DD, mild MR, 40 mm aortic root dilatation, PA peak pressure 34 mmHg.   More recently, patient was admitted in early December 2020 with acute hypoxic respiratory failure.  He required 3 to 4 L of oxygen in the hospital and had a significant transaminitis.  He was tested positive for COVID-19 and was treated with steroid and convalescent plasma.  Due to severe transaminitis, he was unable to get remdesivir or Actemra.  During the hospitalization, heart rate was elevated, Toprol-XL was increased and he was given IV digoxin.  He was placed on oral digoxin prior to discharge. Follow up Dig level was 0.5.   He is seen with his wife today. He is still having difficulty breathing and remains on  oxygen 2 l [place]. CT in January showed pulmonary fibrosis. He notes HR controlled at home. No chest pain or edema. Trying to walk 2 miles/day. Is scheduled for sleep test and PFTs next week with pulmonary follow up. Planning to start Pulmonary Rehab. Feels he has more congestion than he had initially. Falls asleep easily.    Past Medical History:  Diagnosis Date  . Atrial tachycardia ([place])    a. s/p DCCV 09/2009; b. s/p RFCA 03/2011; c. s/p DCCV 04/2011; d. s/p RFCA 09/17/11.  . Cataract   . Chronic systolic CHF (congestive heart failure) ([place])    a. 01/2015 Echo: EF 40-45%, antsept/infsept HK, triv AI, mild MR, mod dil LA, nl RV, mildly dil RA.  Rodney Burns CLL (chronic lymphoblastic leukemia)    Stage 0-1  . Coronary artery disease involving native coronary artery without angina pectoris    a. Status post CABG in 1994:By Dr. Cyndia Bent. LIMA - L Cx, seqSVG-Diag-LAD, and SVG-PDA-PL.  Rodney Burns Cough    Consistant with ACE inhibitor mediated cough  . Dysrhythmia   . Glaucoma (increased eye pressure) 1991  . History of tobacco abuse    quit 1994  . Hypercholesterolemia    Well Controlled  . Hypertension   . Ischemic cardiomyopathy    a. 02/2011 Echo: EF 40-45%;  b. 01/2015 Echo: EF 40-45%.  . Malaria 1972   Hx of  . Pericarditis    a. 01/2015-->Treated w/ ibuprofen/colchicine.  . Sleep-disordered breathing 06/20/2011  Past Surgical History:  Procedure Laterality Date  . ATRIAL FLUTTER ABLATION N/A 04/11/2011   Procedure: ATRIAL FLUTTER ABLATION;  Surgeon: Deboraha Sprang, MD;  Location: [facility] CATH LAB;  Service: Cardiovascular;  Laterality: N/A;  . ATRIAL FLUTTER ABLATION N/A 09/17/2011   Procedure: ATRIAL FLUTTER ABLATION;  Surgeon: Thompson Grayer, MD;  Location: [facility] CATH LAB;  Service: Cardiovascular;  Laterality: N/A;  . CARDIOVERSION  06/03/2011   Procedure: CARDIOVERSION;  Surgeon: Deboraha Sprang, MD;  Location: [facility];  Service: Cardiovascular;  Laterality: N/A;  . CATARACT EXTRACTION    . CORONARY ARTERY BYPASS  GRAFT  1994   By Dr. Cyndia Bent. LIMA - L Cx, seqSVG-Diag-LAD, and SVG-PDA-PL  . ELECTROPHYSIOLOGY STUDY N/A 04/11/2011   Procedure: ELECTROPHYSIOLOGY STUDY;  Surgeon: Deboraha Sprang, MD;  Location: [facility] CATH LAB;  Service: Cardiovascular;  Laterality: N/A;  . EP study and ablation  2013   by Dr Caryl Comes, repeat ablation by Dr Rayann Heman  . NM MYOVIEW LTD  03/2014   scar in the inferior and anteroapical regions without ischemia. This is similar to finding on Myoview in 2013. EF is a little lower 39%>>32%.  Rodney Burns TRIGGER FINGER RELEASE  12/2016   left hand   . US ECHOCARDIOGRAPHY  09-07-09; 02/2011   a. Est EF 40-45%; b. EF 40-45%. Gr 2 DD. Mild LA dil    Current Medications: No outpatient medications have been marked as taking for the 03/22/19 encounter (Office Visit) with Martinique, Jovie Swanner M, MD.     Allergies:   Penicillins, Ace inhibitors, Darvocet [propoxyphene n-acetaminophen], and Sulfa antibiotics   Social History   Socioeconomic History  . Marital status: Married    Spouse name: Not on file  . Number of children: 2  . Years of education: Not on file  . Highest education level: Not on file  Occupational History  . Occupation: Careers adviser: RETIRED  Tobacco Use  . Smoking status: Former Smoker    Packs/day: 2.00    Years: 30.00    Pack years: 60.00    Types: Cigarettes    Quit date: 01/22/1992    Years since quitting: 27.1  . Smokeless tobacco: Never Used  Substance and Sexual Activity  . Alcohol use: No  . Drug use: No  . Sexual activity: Yes  Other Topics Concern  . Not on file  Social History Narrative  . Not on file   Social Determinants of Health   Financial Resource Strain:   . Difficulty of Paying Living Expenses: Not on file  Food Insecurity:   . Worried About Charity fundraiser in the Last Year: Not on file  . Ran Out of Food in the Last Year: Not on file  Transportation Needs:   . Lack of Transportation (Medical): Not on file  . Lack of Transportation  (Non-Medical): Not on file  Physical Activity:   . Days of Exercise per Week: Not on file  . Minutes of Exercise per Session: Not on file  Stress:   . Feeling of Stress : Not on file  Social Connections:   . Frequency of Communication with Friends and Family: Not on file  . Frequency of Social Gatherings with Friends and Family: Not on file  . Attends Religious Services: Not on file  . Active Member of Clubs or Organizations: Not on file  . Attends Archivist Meetings: Not on file  . Marital Status: Not on file     Family History: The patient's  family history includes Cancer in his brother; Down syndrome in his son; Heart failure in his father.  ROS:   Please see the history of present illness.     All other systems reviewed and are negative.  EKGs/Labs/Other Studies Reviewed:    The following studies were reviewed today:  Echo 10/21/2017 Study Conclusions  - Left ventricle: The cavity size was normal. Wall thickness was   increased in a pattern of mild LVH. Systolic function was mildly   to moderately reduced. The estimated ejection fraction was in the range of 40% to 45%. Diffuse hypokinesis. There is hypokinesis of the anteroseptal and inferoseptal myocardium. Doppler parameters are consistent with abnormal left ventricular relaxation (grade 1 diastolic dysfunction). - Ventricular septum: The contour showed diastolic flattening. - Aortic valve: Trileaflet; mildly thickened, mildly calcified   leaflets. - Aorta: Aortic root dimension: 40 mm (ED). - Ascending aorta: The ascending aorta was mildly dilated. - Mitral valve: There was mild regurgitation directed eccentrically   and posteriorly. - Pulmonary arteries: Systolic pressure was mildly increased. PA   peak pressure: 34 mm Hg (S).  Impressions:  - Compared to the prior study, there has been no significant   interval change.   EKG:  EKG is not ordered today.    Recent Labs: 12/29/2018: B Natriuretic  Peptide 208.8; Magnesium 2.4 01/18/2019: ALT 67; BUN 13; Creatinine 0.77; Hemoglobin 13.2; Platelet Count 166; Potassium 4.0; Sodium 137  Recent Lipid Panel    Component Value Date/Time   CHOL 108 10/16/2017 1435   TRIG 93 12/24/2018 1134   HDL 42 10/16/2017 1435   LDLCALC 55 10/16/2017 1435   Dated 05/22/18: cholesterol 115, triglycerides 137, HDL 45, LDL 59   Physical Exam:    VS:  BP 115/60   Pulse 60   Temp 98.1 F (36.7 C)   Resp (!) 97   Ht 5\' 10"  (1.778 m)   Wt 214 lb 6.4 oz (97.3 kg)   BMI 30.76 kg/m     Wt Readings from Last 3 Encounters:  03/22/19 214 lb 6.4 oz (97.3 kg)  03/16/19 216 lb (98 kg)  02/10/19 205 lb 12.8 oz (93.4 kg)     GEN:  Well nourished, well developed in no acute distress HEENT: Normal NECK: No JVD; No carotid bruits LYMPHATICS: No lymphadenopathy CARDIAC: tachycardic, no murmurs, rubs, gallops RESPIRATORY:  Faint crackles.  ABDOMEN: Soft, non-tender, non-distended MUSCULOSKELETAL:  No edema; No deformity  SKIN: Warm and dry NEUROLOGIC:  Alert and oriented x 3 PSYCHIATRIC:  Normal affect   ASSESSMENT:    1. Chronic systolic CHF (congestive heart failure) ([place])   2. Atrial flutter, unspecified type ([place])   3. Essential hypertension   4. Hyperlipidemia LDL goal <70   5. Coronary artery disease involving coronary bypass graft of native heart without angina pectoris    PLAN:    In order of problems listed above:  1. Paroxysmal atrial flutter: Patient has converted back to sinus rhythm., HR well controlled on metoprolol and digoxin. Will continue current rate control and Xarelto  2. PNA/pulmonary fibrosis: Related to recent Covid infection. Remains on oxygen. Pulmonary follow up   3. CAD s/p CABG: asymptomatic.   4. Chronic systolic heart failure: On Toprol-XL and  Entresto. Does not appear to be volume overloaded. With recent events I would like to update Echo.   5. Hypertension: Blood pressure stable  6. Hyperlipidemia: On  Vytorin.   Follow up 4 months.   Medication Adjustments/Labs and Tests Ordered: Current medicines  are reviewed at length with the patient today.  Concerns regarding medicines are outlined above.  Orders Placed This Encounter  Procedures  . ECHOCARDIOGRAM COMPLETE   Meds ordered this encounter  Medications  . sacubitril-valsartan (ENTRESTO) 49-51 MG    Sig: Take 1 tablet by mouth 2 (two) times daily.    Dispense:  180 tablet    Refill:  3  . digoxin (LANOXIN) 0.125 MG tablet    Sig: Take 1 tablet (0.125 mg total) by mouth daily.    Dispense:  90 tablet    Refill:  3  . metoprolol succinate (TOPROL-XL) 50 MG 24 hr tablet    Sig: Take 1 tablet (50 mg) in the mornings and half tablet (25 mg) in the evenings.    Dispense:  135 tablet    Refill:  3    There are no Patient Instructions on file for this visit.   Signed, Janeice Stegall Martinique, MD  03/22/2019 11:38 AM    [facility]

## 2019-03-18 NOTE — Telephone Encounter (Signed)
Pt insurance is active and benefits verified through Medicare a/b Co-pay 0, DED $203/$203 met, out of pocket 0/0 met, co-insurance 20%. no pre-authorization required. Passport, 03/18/2019'@2' :44pm, REF# [id number]

## 2019-03-19 ENCOUNTER — Other Ambulatory Visit: Payer: Self-pay | Admitting: Cardiology

## 2019-03-19 ENCOUNTER — Telehealth (HOSPITAL_COMMUNITY): Payer: Self-pay

## 2019-03-22 ENCOUNTER — Other Ambulatory Visit: Payer: Self-pay

## 2019-03-22 ENCOUNTER — Encounter: Payer: Self-pay | Admitting: Cardiology

## 2019-03-22 ENCOUNTER — Ambulatory Visit (INDEPENDENT_AMBULATORY_CARE_PROVIDER_SITE_OTHER): Payer: Medicare Other | Admitting: Cardiology

## 2019-03-22 ENCOUNTER — Telehealth: Payer: Self-pay | Admitting: Pulmonary Disease

## 2019-03-22 VITALS — BP 115/60 | HR 60 | Temp 98.1°F | Resp 97 | Ht 70.0 in | Wt 214.4 lb

## 2019-03-22 DIAGNOSIS — E785 Hyperlipidemia, unspecified: Secondary | ICD-10-CM

## 2019-03-22 DIAGNOSIS — J9601 Acute respiratory failure with hypoxia: Secondary | ICD-10-CM

## 2019-03-22 DIAGNOSIS — I4892 Unspecified atrial flutter: Secondary | ICD-10-CM

## 2019-03-22 DIAGNOSIS — I25810 Atherosclerosis of coronary artery bypass graft(s) without angina pectoris: Secondary | ICD-10-CM

## 2019-03-22 DIAGNOSIS — I10 Essential (primary) hypertension: Secondary | ICD-10-CM | POA: Diagnosis not present

## 2019-03-22 DIAGNOSIS — I5022 Chronic systolic (congestive) heart failure: Secondary | ICD-10-CM

## 2019-03-22 DIAGNOSIS — D696 Thrombocytopenia, unspecified: Secondary | ICD-10-CM | POA: Insufficient documentation

## 2019-03-22 DIAGNOSIS — C9110 Chronic lymphocytic leukemia of B-cell type not having achieved remission: Secondary | ICD-10-CM

## 2019-03-22 MED ORDER — METOPROLOL SUCCINATE ER 50 MG PO TB24: ORAL_TABLET | ORAL | 3 refills | Status: DC

## 2019-03-22 MED ORDER — ENTRESTO 49-51 MG PO TABS: 1.0000 | ORAL_TABLET | Freq: Two times a day (BID) | ORAL | 3 refills | Status: DC

## 2019-03-22 MED ORDER — DIGOXIN 125 MCG PO TABS: 0.1250 mg | ORAL_TABLET | Freq: Every day | ORAL | 3 refills | Status: DC

## 2019-03-22 NOTE — Telephone Encounter (Signed)
Dr. [name] ordered 02 at night based of ONO.  See Sonia Baller from [employer] response below  Alfredo Martinez, Demetra Shiner, Melissa  [name]  DOB: 01/11/1946/[id number]   Rodena Piety, I pulled orders for nocturnal O2 for this patient per your message. The patient is already on continuous O2 with [place] and has a concentrator and homefill. We contacted the patient to be sure that he still hd the O2, which he does. He did say, however, that he did not have any PAP equipment to bleed the O2 into.  Is this being worked for the patient? I did not see any orders in the system for this or any sleep studies.   Please let me know if I need to put a Triage message in on this.   Thank you  Sonia Baller

## 2019-03-22 NOTE — Telephone Encounter (Signed)
Dr. [name] please advise on this.

## 2019-03-25 NOTE — Telephone Encounter (Signed)
Community message forwarded to [employer] to advise of Dr. Matilde Bash response regarding order placed. Advised to contact office if there are any additional questions.

## 2019-03-25 NOTE — Telephone Encounter (Signed)
He will need to continue the oxygen at night due to nocturnal desats Not sure if he needs a CPAP at present.    We had ordered sleep study to evaluate OSA at last visit on 2/23. Please ensure that this has been scheduled Based on those results we will decide on Pap therapy

## 2019-03-26 ENCOUNTER — Other Ambulatory Visit (HOSPITAL_COMMUNITY)
Admission: RE | Admit: 2019-03-26 | Discharge: 2019-03-26 | Disposition: A | Discharge status: Home / Self Care | Payer: Medicare Other | Source: Ambulatory Visit | Attending: Pulmonary Disease | Admitting: Pulmonary Disease

## 2019-03-26 DIAGNOSIS — Z01812 Encounter for preprocedural laboratory examination: Secondary | ICD-10-CM | POA: Insufficient documentation

## 2019-03-26 DIAGNOSIS — I5020 Unspecified systolic (congestive) heart failure: Secondary | ICD-10-CM | POA: Diagnosis not present

## 2019-03-26 DIAGNOSIS — I2510 Atherosclerotic heart disease of native coronary artery without angina pectoris: Secondary | ICD-10-CM | POA: Diagnosis not present

## 2019-03-26 DIAGNOSIS — I10 Essential (primary) hypertension: Secondary | ICD-10-CM | POA: Diagnosis not present

## 2019-03-26 DIAGNOSIS — Z20822 Contact with and (suspected) exposure to covid-19: Secondary | ICD-10-CM | POA: Diagnosis not present

## 2019-03-26 DIAGNOSIS — E785 Hyperlipidemia, unspecified: Secondary | ICD-10-CM | POA: Diagnosis not present

## 2019-03-26 LAB — SARS CORONAVIRUS 2 (TAT 6-24 HRS): SARS Coronavirus 2: NEGATIVE

## 2019-03-29 ENCOUNTER — Other Ambulatory Visit: Payer: Self-pay

## 2019-03-29 ENCOUNTER — Ambulatory Visit (INDEPENDENT_AMBULATORY_CARE_PROVIDER_SITE_OTHER): Payer: Medicare Other | Admitting: Pulmonary Disease

## 2019-03-29 DIAGNOSIS — R0602 Shortness of breath: Secondary | ICD-10-CM | POA: Diagnosis not present

## 2019-03-29 DIAGNOSIS — U071 COVID-19: Secondary | ICD-10-CM

## 2019-03-29 DIAGNOSIS — J1282 Pneumonia due to coronavirus disease 2019: Secondary | ICD-10-CM

## 2019-03-29 LAB — PULMONARY FUNCTION TEST
DL/VA % pred: 69 %
DL/VA: 2.76 ml/min/mmHg/L
DLCO cor % pred: 54 %
DLCO cor: 14.21 ml/min/mmHg
DLCO unc % pred: 54 %
DLCO unc: 14.25 ml/min/mmHg
FEF 25-75 Post: 2.25 L/s
FEF 25-75 Pre: 2.15 L/s
FEF2575-%Change-Post: 4 %
FEF2575-%Pred-Post: 95 %
FEF2575-%Pred-Pre: 90 %
FEV1-%Change-Post: 2 %
FEV1-%Pred-Post: 80 %
FEV1-%Pred-Pre: 77 %
FEV1-Post: 2.59 L
FEV1-Pre: 2.52 L
FEV1FVC-%Change-Post: 2 %
FEV1FVC-%Pred-Pre: 104 %
FEV6-%Change-Post: 0 %
FEV6-%Pred-Post: 79 %
FEV6-%Pred-Pre: 79 %
FEV6-Post: 3.32 L
FEV6-Pre: 3.31 L
FEV6FVC-%Pred-Post: 106 %
FEV6FVC-%Pred-Pre: 106 %
FVC-%Change-Post: 0 %
FVC-%Pred-Post: 74 %
FVC-%Pred-Pre: 74 %
FVC-Post: 3.32 L
FVC-Pre: 3.31 L
Post FEV1/FVC ratio: 78 %
Post FEV6/FVC ratio: 100 %
Pre FEV1/FVC ratio: 76 %
Pre FEV6/FVC Ratio: 100 %
RV % pred: 85 %
RV: 2.21 L
TLC % pred: 79 %
TLC: 5.78 L

## 2019-03-29 NOTE — Progress Notes (Signed)
Full PFT performed today. °

## 2019-03-30 ENCOUNTER — Ambulatory Visit (HOSPITAL_BASED_OUTPATIENT_CLINIC_OR_DEPARTMENT_OTHER): Payer: Medicare Other | Attending: Pulmonary Disease | Admitting: Pulmonary Disease

## 2019-03-30 ENCOUNTER — Other Ambulatory Visit: Payer: Self-pay

## 2019-03-30 DIAGNOSIS — Z79899 Other long term (current) drug therapy: Secondary | ICD-10-CM | POA: Diagnosis not present

## 2019-03-30 DIAGNOSIS — G4730 Sleep apnea, unspecified: Secondary | ICD-10-CM

## 2019-03-30 DIAGNOSIS — I4891 Unspecified atrial fibrillation: Secondary | ICD-10-CM | POA: Diagnosis not present

## 2019-03-30 DIAGNOSIS — Z7901 Long term (current) use of anticoagulants: Secondary | ICD-10-CM | POA: Insufficient documentation

## 2019-03-30 DIAGNOSIS — G4733 Obstructive sleep apnea (adult) (pediatric): Secondary | ICD-10-CM | POA: Insufficient documentation

## 2019-03-30 DIAGNOSIS — R0902 Hypoxemia: Secondary | ICD-10-CM | POA: Insufficient documentation

## 2019-03-30 DIAGNOSIS — I10 Essential (primary) hypertension: Secondary | ICD-10-CM | POA: Diagnosis not present

## 2019-03-31 ENCOUNTER — Telehealth (HOSPITAL_COMMUNITY): Payer: Self-pay | Admitting: *Deleted

## 2019-04-01 ENCOUNTER — Encounter (HOSPITAL_COMMUNITY)
Admission: RE | Admit: 2019-04-01 | Discharge: 2019-04-01 | Disposition: A | Discharge status: Home / Self Care | Payer: Medicare Other | Source: Ambulatory Visit | Attending: Pulmonary Disease | Admitting: Pulmonary Disease

## 2019-04-01 ENCOUNTER — Other Ambulatory Visit: Payer: Self-pay

## 2019-04-01 VITALS — BP 124/64 | HR 75 | Ht 70.0 in | Wt 218.7 lb

## 2019-04-01 DIAGNOSIS — I5020 Unspecified systolic (congestive) heart failure: Secondary | ICD-10-CM | POA: Diagnosis not present

## 2019-04-01 DIAGNOSIS — I10 Essential (primary) hypertension: Secondary | ICD-10-CM | POA: Diagnosis not present

## 2019-04-01 DIAGNOSIS — I4892 Unspecified atrial flutter: Secondary | ICD-10-CM | POA: Diagnosis not present

## 2019-04-01 DIAGNOSIS — Z1159 Encounter for screening for other viral diseases: Secondary | ICD-10-CM | POA: Diagnosis not present

## 2019-04-01 DIAGNOSIS — C9110 Chronic lymphocytic leukemia of B-cell type not having achieved remission: Secondary | ICD-10-CM | POA: Diagnosis not present

## 2019-04-01 DIAGNOSIS — Z79899 Other long term (current) drug therapy: Secondary | ICD-10-CM | POA: Diagnosis not present

## 2019-04-01 DIAGNOSIS — Z125 Encounter for screening for malignant neoplasm of prostate: Secondary | ICD-10-CM | POA: Diagnosis not present

## 2019-04-01 DIAGNOSIS — I2510 Atherosclerotic heart disease of native coronary artery without angina pectoris: Secondary | ICD-10-CM | POA: Diagnosis not present

## 2019-04-01 DIAGNOSIS — D696 Thrombocytopenia, unspecified: Secondary | ICD-10-CM | POA: Diagnosis not present

## 2019-04-01 DIAGNOSIS — E785 Hyperlipidemia, unspecified: Secondary | ICD-10-CM | POA: Diagnosis not present

## 2019-04-01 DIAGNOSIS — J8410 Pulmonary fibrosis, unspecified: Secondary | ICD-10-CM | POA: Diagnosis not present

## 2019-04-01 DIAGNOSIS — R8290 Unspecified abnormal findings in urine: Secondary | ICD-10-CM | POA: Diagnosis not present

## 2019-04-01 DIAGNOSIS — R0602 Shortness of breath: Secondary | ICD-10-CM | POA: Insufficient documentation

## 2019-04-01 DIAGNOSIS — R7301 Impaired fasting glucose: Secondary | ICD-10-CM | POA: Diagnosis not present

## 2019-04-01 DIAGNOSIS — Z0000 Encounter for general adult medical examination without abnormal findings: Secondary | ICD-10-CM | POA: Diagnosis not present

## 2019-04-01 NOTE — Progress Notes (Signed)
Pulmonary Individual Treatment Plan  Patient Details   Name: Rodney Burns MRN: [id number] Date of Birth: 16-Jul-1945 Referring Provider:     Pulmonary Rehab Walk Test from 04/01/2019 in [place]  Referring Provider  Dr. [name]      Initial Encounter Date:    Pulmonary Rehab Walk Test from 04/01/2019 in [place]  Date  04/01/19      Visit Diagnosis: Shortness of breath  Patient's Home Medications on Admission:   Current Outpatient Medications:  .  albuterol (VENTOLIN HFA) 108 (90 Base) MCG/ACT inhaler, Inhale 1 puff into the lungs every 6 (six) hours as needed for wheezing or shortness of breath., Disp: , Rfl:  .  digoxin (LANOXIN) 0.125 MG tablet, Take 1 tablet (0.125 mg total) by mouth daily., Disp: 90 tablet, Rfl: 3 .  Docusate Calcium (STOOL SOFTENER PO), Take by mouth 2 (two) times daily., Disp: , Rfl:  .  ezetimibe-simvastatin (VYTORIN) 10-20 MG tablet, TAKE 1 TABLET AT BEDTIME (KEEP UPCOMING APPOINTMENT FOR REFILLS), Disp: 90 tablet, Rfl: 3 .  Fiber, Guar Gum, CHEW, Chew 3 capsules by mouth daily. , Disp: , Rfl:  .  latanoprost (XALATAN) 0.005 % ophthalmic solution, Place 1 drop into both eyes at bedtime. , Disp: , Rfl:  .  Magnesium 250 MG TABS, Take 500 mg by mouth at bedtime., Disp: , Rfl:  .  metoprolol succinate (TOPROL-XL) 50 MG 24 hr tablet, Take 1 tablet (50 mg) in the mornings and half tablet (25 mg) in the evenings., Disp: 135 tablet, Rfl: 3 .  pramipexole (MIRAPEX) 0.5 MG tablet, Take 1.5 mg by mouth daily. , Disp: , Rfl:  .  predniSONE (DELTASONE) 5 MG tablet, Take 10mg  daily, Disp: 180 tablet, Rfl: 1 .  RABEprazole (ACIPHEX) 20 MG tablet, Take 1 tablet (20 mg total) by mouth daily., Disp: 90 tablet, Rfl: 3 .  sacubitril-valsartan (ENTRESTO) 49-51 MG, Take 1 tablet by mouth 2 (two) times daily., Disp: 180 tablet, Rfl: 3 .  traMADol (ULTRAM) 50 MG tablet, TAKE 1 TABLET EVERY 6 HOURS AS NEEDED FOR  MODERATE PAIN, Disp: 120 tablet, Rfl: 0 .  umeclidinium-vilanterol (ANORO ELLIPTA) 62.5-25 MCG/INH AEPB, Inhale 1 puff into the lungs daily., Disp: 180 each, Rfl: 3 .  XARELTO 20 MG TABS tablet, TAKE 1 TABLET DAILY WITH SUPPER, Disp: 90 tablet, Rfl: 3  Past Medical History: Past Medical History:  Diagnosis Date  . Atrial tachycardia ([place])    a. s/p DCCV 09/2009; b. s/p RFCA 03/2011; c. s/p DCCV 04/2011; d. s/p RFCA 09/17/11.  . Cataract   . Chronic systolic CHF (congestive heart failure) ([place])    a. 01/2015 Echo: EF 40-45%, antsept/infsept HK, triv AI, mild MR, mod dil LA, nl RV, mildly dil RA.  [name] CLL (chronic lymphoblastic leukemia)    Stage 0-1  . Coronary artery disease involving native coronary artery without angina pectoris    a. Status post CABG in 1994:By Dr. Cyndia Bent. LIMA - L Cx, seqSVG-Diag-LAD, and SVG-PDA-PL.  [name] Cough    Consistant with ACE inhibitor mediated cough  . Dysrhythmia   . Glaucoma (increased eye pressure) 1991  . History of tobacco abuse    quit 1994  . Hypercholesterolemia    Well Controlled  . Hypertension   . Ischemic cardiomyopathy    a. 02/2011 Echo: EF 40-45%;  b. 01/2015 Echo: EF 40-45%.  . Malaria 1972   Hx of  . Pericarditis    a. 01/2015-->Treated w/  ibuprofen/colchicine.  . Sleep-disordered breathing 06/20/2011    Tobacco Use: Social History   Tobacco Use  Smoking Status Former Smoker  . Packs/day: 2.00  . Years: 30.00  . Pack years: 60.00  . Types: Cigarettes  . Quit date: 01/22/1992  . Years since quitting: 27.2  Smokeless Tobacco Never Used    Labs: Recent Chemical engineer    Labs for ITP Cardiac and Pulmonary Rehab Latest Ref Rng & Units 02/21/2017 10/16/2017 12/24/2018   Cholestrol 100 - 199 mg/dL 142 108 -   LDLCALC 0 - 99 mg/dL - 55 -   HDL >39 mg/dL - 42 -   Trlycerides <150 mg/dL - 56 93   Hemoglobin A1c 4.8 - 5.6 % - - 6.7(H)      Capillary Blood Glucose: Lab Results  Component Value Date   GLUCAP 114 (H) 12/30/2018    GLUCAP 154 (H) 12/30/2018   GLUCAP 129 (H) 12/30/2018   GLUCAP 73 12/30/2018   GLUCAP 170 (H) 12/29/2018     Pulmonary Assessment Scores: Pulmonary Assessment Scores    Row Name 04/01/19 1508 04/01/19 1549       ADL UCSD   ADL Phase  Entry  Entry    SOB Score total  32  --      CAT Score   CAT Score  26  --      mMRC Score   mMRC Score  --  2      UCSD: Self-administered rating of dyspnea associated with activities of daily living (ADLs) 6-point scale (0 = "not at all" to 5 = "maximal or unable to do because of breathlessness")  Scoring Scores range from 0 to 120.  Minimally important difference is 5 units  CAT: CAT can identify the health impairment of COPD patients and is better correlated with disease progression.  CAT has a scoring range of zero to 40. The CAT score is classified into four groups of low (less than 10), medium (10 - 20), high (21-30) and very high (31-40) based on the impact level of disease on health status. A CAT score over 10 suggests significant symptoms.  A worsening CAT score could be explained by an exacerbation, poor medication adherence, poor inhaler technique, or progression of COPD or comorbid conditions.  CAT MCID is 2 points  mMRC: mMRC (Modified Medical Research Council) Dyspnea Scale is used to assess the degree of baseline functional disability in patients of respiratory disease due to dyspnea. No minimal important difference is established. A decrease in score of 1 point or greater is considered a positive change.   Pulmonary Function Assessment: Pulmonary Function Assessment - 04/01/19 1452      Breath   Bilateral Breath Sounds  Clear    Shortness of Breath  Yes;Limiting activity       Exercise Target Goals: Exercise Program Goal: Individual exercise prescription set using results from initial 6 min walk test and THRR while considering  patient's activity barriers and safety.   Exercise Prescription Goal: Initial exercise  prescription builds to 30-45 minutes a day of aerobic activity, 2-3 days per week.  Home exercise guidelines will be given to patient during program as part of exercise prescription that the participant will acknowledge.  Activity Barriers & Risk Stratification: Activity Barriers & Cardiac Risk Stratification - 04/01/19 1448      Activity Barriers & Cardiac Risk Stratification   Activity Barriers  Muscular Weakness;Shortness of Breath;Deconditioning       6 Minute Walk: 6 Minute Walk  Row Name 04/01/19 1549         6 Minute Walk   Phase  Initial     Distance  1332 feet     Walk Time  6 minutes     # of Rest Breaks  0     MPH  2.52     METS  2.79     RPE  13     Perceived Dyspnea   2     VO2 Peak  9.76     Symptoms  No     Resting HR  74 bpm     Resting BP  124/64     Resting Oxygen Saturation   97 %     Exercise Oxygen Saturation  during 6 min walk  92 %     Max Ex. HR  114 bpm     Max Ex. BP  144/66     2 Minute Post BP  130/68       Interval HR   1 Minute HR  112     2 Minute HR  114     3 Minute HR  113     4 Minute HR  111     5 Minute HR  111     6 Minute HR  112     2 Minute Post HR  76     Interval Heart Rate?  Yes       Interval Oxygen   Interval Oxygen?  Yes     Baseline Oxygen Saturation %  97 %     1 Minute Oxygen Saturation %  95 %     1 Minute Liters of Oxygen  4 L     2 Minute Oxygen Saturation %  93 %     2 Minute Liters of Oxygen  4 L     3 Minute Oxygen Saturation %  93 %     3 Minute Liters of Oxygen  4 L     4 Minute Oxygen Saturation %  93 %     4 Minute Liters of Oxygen  4 L     5 Minute Oxygen Saturation %  92 %     5 Minute Liters of Oxygen  4 L     6 Minute Oxygen Saturation %  92 %     6 Minute Liters of Oxygen  4 L     2 Minute Post Oxygen Saturation %  97 %     2 Minute Post Liters of Oxygen  2 L        Oxygen Initial Assessment: Oxygen Initial Assessment - 04/01/19 1548      Home Oxygen   Home Oxygen Device  Home  Concentrator;E-Tanks    Sleep Oxygen Prescription  Continuous    Liters per minute  2    Home Exercise Oxygen Prescription  Continuous    Liters per minute  4    Home at Rest Exercise Oxygen Prescription  None    Compliance with Home Oxygen Use  Yes      Initial 6 min Walk   Oxygen Used  Continuous    Liters per minute  4      Program Oxygen Prescription   Program Oxygen Prescription  Continuous    Liters per minute  4      Intervention   Short Term Goals  To learn and exhibit compliance with exercise, home and travel O2 prescription;To learn and understand importance of monitoring  SPO2 with pulse oximeter and demonstrate accurate use of the pulse oximeter.;To learn and understand importance of maintaining oxygen saturations>88%;To learn and demonstrate proper pursed lip breathing techniques or other breathing techniques.;To learn and demonstrate proper use of respiratory medications    Long  Term Goals  Exhibits compliance with exercise, home and travel O2 prescription;Verbalizes importance of monitoring SPO2 with pulse oximeter and return demonstration;Maintenance of O2 saturations>88%;Exhibits proper breathing techniques, such as pursed lip breathing or other method taught during program session;Compliance with respiratory medication;Demonstrates proper use of MDI's       Oxygen Re-Evaluation:   Oxygen Discharge (Final Oxygen Re-Evaluation):   Initial Exercise Prescription: Initial Exercise Prescription - 04/01/19 1500      Date of Initial Exercise RX and Referring Provider   Date  04/01/19    Referring Provider  Dr. [name]      Oxygen   Oxygen  Continuous    Liters  4      Treadmill   MPH  1.6    Grade  0    Minutes  15      NuStep   Level  2    SPM  80    Minutes  15      Prescription Details   Frequency (times per week)  2    Duration  Progress to 30 minutes of continuous aerobic without signs/symptoms of physical distress      Intensity   THRR 40-80% of  Max Heartrate  58-117    Ratings of Perceived Exertion  11-13    Perceived Dyspnea  0-4      Progression   Progression  Continue progressive overload as per policy without signs/symptoms or physical distress.      Resistance Training   Training Prescription  Yes    Weight  blue bands    Reps  10-15       Perform Capillary Blood Glucose checks as needed.  Exercise Prescription Changes:   Exercise Comments:   Exercise Goals and Review: Exercise Goals    Row Name 04/01/19 1556             Exercise Goals   Increase Physical Activity  Yes       Intervention  Provide advice, education, support and counseling about physical activity/exercise needs.;Develop an individualized exercise prescription for aerobic and resistive training based on initial evaluation findings, risk stratification, comorbidities and participant's personal goals.       Expected Outcomes  Short Term: Attend rehab on a regular basis to increase amount of physical activity.;Long Term: Add in home exercise to make exercise part of routine and to increase amount of physical activity.;Long Term: Exercising regularly at least 3-5 days a week.       Increase Strength and Stamina  Yes       Intervention  Provide advice, education, support and counseling about physical activity/exercise needs.;Develop an individualized exercise prescription for aerobic and resistive training based on initial evaluation findings, risk stratification, comorbidities and participant's personal goals.       Expected Outcomes  Short Term: Increase workloads from initial exercise prescription for resistance, speed, and METs.;Short Term: Perform resistance training exercises routinely during rehab and add in resistance training at home;Long Term: Improve cardiorespiratory fitness, muscular endurance and strength as measured by increased METs and functional capacity (6MWT)       Able to understand and use rate of perceived exertion (RPE) scale  Yes        Intervention  Provide education and  explanation on how to use RPE scale       Expected Outcomes  Short Term: Able to use RPE daily in rehab to express subjective intensity level;Long Term:  Able to use RPE to guide intensity level when exercising independently       Able to understand and use Dyspnea scale  Yes       Intervention  Provide education and explanation on how to use Dyspnea scale       Expected Outcomes  Short Term: Able to use Dyspnea scale daily in rehab to express subjective sense of shortness of breath during exertion;Long Term: Able to use Dyspnea scale to guide intensity level when exercising independently       Knowledge and understanding of Target Heart Rate Range (THRR)  Yes       Intervention  Provide education and explanation of THRR including how the numbers were predicted and where they are located for reference       Expected Outcomes  Short Term: Able to state/look up THRR;Short Term: Able to use daily as guideline for intensity in rehab;Long Term: Able to use THRR to govern intensity when exercising independently       Understanding of Exercise Prescription  Yes       Intervention  Provide education, explanation, and written materials on patient's individual exercise prescription       Expected Outcomes  Short Term: Able to explain program exercise prescription          Exercise Goals Re-Evaluation :   Discharge Exercise Prescription (Final Exercise Prescription Changes):   Nutrition:  Target Goals: Understanding of nutrition guidelines, daily intake of sodium 1500mg , cholesterol 200mg , calories 30% from fat and 7% or less from saturated fats, daily to have 5 or more servings of fruits and vegetables.  Biometrics: Pre Biometrics - 04/01/19 1449      Pre Biometrics   Height  5\' 10"  (1.778 m)    Weight  99.2 kg    BMI (Calculated)  31.38    Grip Strength  40 kg        Nutrition Therapy Plan and Nutrition Goals:   Nutrition  Assessments:   Nutrition Goals Re-Evaluation:   Nutrition Goals Discharge (Final Nutrition Goals Re-Evaluation):   Psychosocial: Target Goals: Acknowledge presence or absence of significant depression and/or stress, maximize coping skills, provide positive support system. Participant is able to verbalize types and ability to use techniques and skills needed for reducing stress and depression.  Initial Review & Psychosocial Screening: Initial Psych Review & Screening - 04/01/19 1510      Initial Review   Current issues with  None Identified      Family Dynamics   Good Support System?  Yes      Barriers   Psychosocial barriers to participate in program  There are no identifiable barriers or psychosocial needs.      Screening Interventions   Interventions  Encouraged to exercise       Quality of Life Scores:  Scores of 19 and below usually indicate a poorer quality of life in these areas.  A difference of  2-3 points is a clinically meaningful difference.  A difference of 2-3 points in the total score of the Quality of Life Index has been associated with significant improvement in overall quality of life, self-image, physical symptoms, and general health in studies assessing change in quality of life.  PHQ-9: Recent Review Flowsheet Data    Depression screen [facility] 2/9 04/01/2019  Decreased Interest 0   Down, Depressed, Hopeless 0   PHQ - 2 Score 0   Altered sleeping 0   Tired, decreased energy 0   Change in appetite 0   Feeling bad or failure about yourself  0   Trouble concentrating 0   Moving slowly or fidgety/restless 0   Suicidal thoughts 0   Difficult doing work/chores Not difficult at all     Interpretation of Total Score  Total Score Depression Severity:  1-4 = Minimal depression, 5-9 = Mild depression, 10-14 = Moderate depression, 15-19 = Moderately severe depression, 20-27 = Severe depression   Psychosocial Evaluation and Intervention: Psychosocial Evaluation  - 04/01/19 1510      Psychosocial Evaluation & Interventions   Interventions  Encouraged to exercise with the program and follow exercise prescription    Continue Psychosocial Services   No Follow up required       Psychosocial Re-Evaluation:   Psychosocial Discharge (Final Psychosocial Re-Evaluation):   Education: Education Goals: Education classes will be provided on a weekly basis, covering required topics. Participant will state understanding/return demonstration of topics presented.  Learning Barriers/Preferences: Learning Barriers/Preferences - 04/01/19 1511      Learning Barriers/Preferences   Learning Barriers  None    Learning Preferences  Audio;Computer/Internet;Group Instruction;Individual Instruction;Pictoral;Skilled Demonstration;Verbal Instruction;Video;Written Material       Education Topics: Risk Factor Reduction:  -Group instruction that is supported by a PowerPoint presentation. Instructor discusses the definition of a risk factor, different risk factors for pulmonary disease, and how the heart and lungs work together.     Nutrition for Pulmonary Patient:  -Group instruction provided by PowerPoint slides, verbal discussion, and written materials to support subject matter. The instructor gives an explanation and review of healthy diet recommendations, which includes a discussion on weight management, recommendations for fruit and vegetable consumption, as well as protein, fluid, caffeine, fiber, sodium, sugar, and alcohol. Tips for eating when patients are short of breath are discussed.   Pursed Lip Breathing:  -Group instruction that is supported by demonstration and informational handouts. Instructor discusses the benefits of pursed lip and diaphragmatic breathing and detailed demonstration on how to preform both.     Oxygen Safety:  -Group instruction provided by PowerPoint, verbal discussion, and written material to support subject matter. There is an  overview of "What is Oxygen" and "Why do we need it".  Instructor also reviews how to create a safe environment for oxygen use, the importance of using oxygen as prescribed, and the risks of noncompliance. There is a brief discussion on traveling with oxygen and resources the patient may utilize.   Oxygen Equipment:  -Group instruction provided by [facility] Staff utilizing handouts, written materials, and equipment demonstrations.   Signs and Symptoms:  -Group instruction provided by written material and verbal discussion to support subject matter. Warning signs and symptoms of infection, stroke, and heart attack are reviewed and when to call the physician/911 reinforced. Tips for preventing the spread of infection discussed.   Advanced Directives:  -Group instruction provided by verbal instruction and written material to support subject matter. Instructor reviews Advanced Directive laws and proper instruction for filling out document.   Pulmonary Video:  -Group video education that reviews the importance of medication and oxygen compliance, exercise, good nutrition, pulmonary hygiene, and pursed lip and diaphragmatic breathing for the pulmonary patient.   Exercise for the Pulmonary Patient:  -Group instruction that is supported by a PowerPoint presentation. Instructor discusses benefits of exercise, core components of exercise,  frequency, duration, and intensity of an exercise routine, importance of utilizing pulse oximetry during exercise, safety while exercising, and options of places to exercise outside of rehab.     Pulmonary Medications:  -Verbally interactive group education provided by instructor with focus on inhaled medications and proper administration.   Anatomy and Physiology of the Respiratory System and Intimacy:  -Group instruction provided by PowerPoint, verbal discussion, and written material to support subject matter. Instructor reviews respiratory cycle and  anatomical components of the respiratory system and their functions. Instructor also reviews differences in obstructive and restrictive respiratory diseases with examples of each. Intimacy, Sex, and Sexuality differences are reviewed with a discussion on how relationships can change when diagnosed with pulmonary disease. Common sexual concerns are reviewed.   MD DAY -A group question and answer session with a medical doctor that allows participants to ask questions that relate to their pulmonary disease state.   OTHER EDUCATION -Group or individual verbal, written, or video instructions that support the educational goals of the pulmonary rehab program.   Holiday Eating Survival Tips:  -Group instruction provided by PowerPoint slides, verbal discussion, and written materials to support subject matter. The instructor gives patients tips, tricks, and techniques to help them not only survive but enjoy the holidays despite the onslaught of food that accompanies the holidays.   Knowledge Questionnaire Score: Knowledge Questionnaire Score - 04/01/19 1510      Knowledge Questionnaire Score   Pre Score  14/18       Core Components/Risk Factors/Patient Goals at Admission: Personal Goals and Risk Factors at Admission - 04/01/19 1511      Core Components/Risk Factors/Patient Goals on Admission   Improve shortness of breath with ADL's  Yes    Intervention  Provide education, individualized exercise plan and daily activity instruction to help decrease symptoms of SOB with activities of daily living.    Expected Outcomes  Short Term: Improve cardiorespiratory fitness to achieve a reduction of symptoms when performing ADLs;Long Term: Be able to perform more ADLs without symptoms or delay the onset of symptoms       Core Components/Risk Factors/Patient Goals Review:  Goals and Risk Factor Review    Row Name 04/01/19 1512             Core Components/Risk Factors/Patient Goals Review    Personal Goals Review  Develop more efficient breathing techniques such as purse lipped breathing and diaphragmatic breathing and practicing self-pacing with activity.;Increase knowledge of respiratory medications and ability to use respiratory devices properly.;Improve shortness of breath with ADL's          Core Components/Risk Factors/Patient Goals at Discharge (Final Review):  Goals and Risk Factor Review - 04/01/19 1512      Core Components/Risk Factors/Patient Goals Review   Personal Goals Review  Develop more efficient breathing techniques such as purse lipped breathing and diaphragmatic breathing and practicing self-pacing with activity.;Increase knowledge of respiratory medications and ability to use respiratory devices properly.;Improve shortness of breath with ADL's       ITP Comments:   Comments:

## 2019-04-01 NOTE — Progress Notes (Signed)
Rodney Burns 74 y.o. male Pulmonary Rehab Orientation Note Patient arrived today in Cardiac and Pulmonary Rehab for orientation to Pulmonary Rehab. [facility] oxygen and tolerated it well. He does carry portable oxygen. Per pt, he uses oxygen while sleeping and with exertion. Color good, skin warm and dry. Patient is oriented to time and place. Patient's medical history, psychosocial health, and medications reviewed. Psychosocial assessment reveals pt lives with their spouse. Pt is currently retired after 20 years in the [employer].  Pt reports his stress level is low. Areas of stress/anxiety include Health. He had COVID-19 in November and since he has had to use supplemental oxygen.   Pt does not exhibit signs of depression.  PHQ2/9 score 0/0. Pt shows good  coping skills with positive outlook .  Will continue to monitor and evaluate progress toward psychosocial goal(s) of continued well-being without barriers or psychosocial concerns while participating in pulmonary rehab. Physical assessment reveals heart rate is normal, breath sounds clear to auscultation, no wheezes, rales, or rhonchi. Grip strength equal, strong. Patient reports hedoes take medications as prescribed. Patient states he follows a Regular diet. The patient reports no specific efforts to gain or lose weight.. Patient's weight will be monitored closely. Demonstration and practice of PLB using pulse oximeter. Patient able to return demonstration satisfactorily. Safety and hand hygiene in the exercise area reviewed with patient. Patient voices understanding of the information reviewed. Department expectations discussed with patient and achievable goals were set. The patient shows enthusiasm about attending the program and we look forward to working with this nice gentleman. The patient completed a 6 min walk test today and to begin exercise on Tuesday, 04/06/2019 in the 1045 class.

## 2019-04-02 DIAGNOSIS — G4730 Sleep apnea, unspecified: Secondary | ICD-10-CM

## 2019-04-02 NOTE — Procedures (Signed)
    Patient Name: Rodney Burns, Rodney Burns Date: 03/30/2019 Gender: Male D.O.B: [birth year] Age (years): 7 Referring Provider: Marshell Garfinkel Height (inches): 45 Interpreting Physician: Chesley Mires MD, ABSM Weight (lbs): 211 RPSGT: Carolin Coy BMI: 30 MRN: [id number] Neck Size: 17.00  CLINICAL INFORMATION Sleep Study Type: NPSG  Indication for sleep study: Fatigue, Hypertension, Snoring  Epworth Sleepiness Score: 6  SLEEP STUDY TECHNIQUE As per the AASM Manual for the Scoring of Sleep and Associated Events v2.3 (April 2016) with a hypopnea requiring 4% desaturations.  The channels recorded and monitored were frontal, central and occipital EEG, electrooculogram (EOG), submentalis EMG (chin), nasal and oral airflow, thoracic and abdominal wall motion, anterior tibialis EMG, snore microphone, electrocardiogram, and pulse oximetry.  MEDICATIONS Medications self-administered by patient taken the night of the study : entresto, VYTORIN, XALATAN, MAGNESIUM, toprol-xl, MIRAPEX, stool softner, xarelto, VITAMIN D  SLEEP ARCHITECTURE The study was initiated at 10:02:16 PM and ended at 4:13:00 AM.  Sleep onset time was 2.4 minutes and the sleep efficiency was 92.2%%. The total sleep time was 342 minutes.  Stage REM latency was 27.5 minutes.  The patient spent 24.6%% of the night in stage N1 sleep, 65.8%% in stage N2 sleep, 0.0%% in stage N3 and 9.7% in REM.  Alpha intrusion was absent.  Supine sleep was 0.00%.  RESPIRATORY PARAMETERS The overall apnea/hypopnea index (AHI) was 22.8 per hour. There were 34 total apneas, including 29 obstructive, 5 central and 0 mixed apneas. There were 96 hypopneas and 71 RERAs.  The AHI during Stage REM sleep was 12.7 per hour.  AHI while supine was N/A per hour.  The mean oxygen saturation was 91.6%. The minimum SpO2 during sleep was 85.0%.  moderate snoring was noted during this study.  CARDIAC DATA The 2 lead EKG demonstrated sinus rhythm.  The mean heart rate was 62.2 beats per minute. Other EKG findings include: PVCs, Atrial Fibrillation.  LEG MOVEMENT DATA The total PLMS were 0 with a resulting PLMS index of 0.0. Associated arousal with leg movement index was 3.3 .  IMPRESSIONS - Moderate obstructive sleep apnea with an AHI of 22.8 and SpO2 low of 85%. - Due to persistent hypoxemia, the patient had 1 liter supplemental oxygen applied during this study. - ECG showed atrial fibrillation.  He has know history of this.  DIAGNOSIS - Obstructive Sleep Apnea (327.23 [G47.33 ICD-10]) - Nocturnal Hypoxemia (327.26 [G47.36 ICD-10])  RECOMMENDATIONS - Therapeutic CPAP titration to determine optimal pressure required to alleviate sleep disordered breathing.  [Electronically signed] 04/02/2019 04:56 PM  Chesley Mires MD, [place], American Board of Sleep Medicine   NPI: [id number]

## 2019-04-06 ENCOUNTER — Encounter (HOSPITAL_COMMUNITY)
Admission: RE | Admit: 2019-04-06 | Discharge: 2019-04-06 | Disposition: A | Discharge status: Home / Self Care | Payer: Medicare Other | Source: Ambulatory Visit | Attending: Pulmonary Disease | Admitting: Pulmonary Disease

## 2019-04-06 ENCOUNTER — Other Ambulatory Visit: Payer: Self-pay

## 2019-04-06 DIAGNOSIS — R0602 Shortness of breath: Secondary | ICD-10-CM | POA: Diagnosis not present

## 2019-04-06 NOTE — Progress Notes (Signed)
Daily Session Note  Patient Details  Name: Rodney Burns MRN: [id number] Date of Birth: June 29, 1945 Referring Provider:     Pulmonary Rehab Walk Test from 04/01/2019 in [place]  Referring Provider  Dr. [name]      Encounter Date: 04/06/2019  Check In: Session Check In - 04/06/19 1053      Check-In   Supervising physician immediately available to respond to emergencies  See telemetry face sheet for immediately available MD    Physician(s)  Dr. Dessa Phi    Location  [facility]    Staff Present  Rosebud Poles, RN, BSN;Lisa Ysidro Evert, RN;Enmanuel Zufall Kris Mouton, MS, Exercise Physiologist    Virtual Visit  No    Medication changes reported      No    Fall or balance concerns reported     No    Tobacco Cessation  No Change    Warm-up and Cool-down  Performed as group-led instruction    Resistance Training Performed  Yes    VAD Patient?  No    PAD/SET Patient?  No      Pain Assessment   Currently in Pain?  No/denies    Multiple Pain Sites  No       Capillary Blood Glucose: No results found for this or any previous visit (from the past 24 hour(s)).    Social History   Tobacco Use  Smoking Status Former Smoker  . Packs/day: 2.00  . Years: 30.00  . Pack years: 60.00  . Types: Cigarettes  . Quit date: 01/22/1992  . Years since quitting: 27.2  Smokeless Tobacco Never Used    Goals Met:  Independence with exercise equipment Exercise tolerated well Strength training completed today  Goals Unmet:  Not Applicable  Comments: Service time is from 1035 to 1136    Dr. Fransico Him is Medical Director for Cardiac Rehab at [facility].

## 2019-04-06 NOTE — Progress Notes (Signed)
LMTCB x 1 

## 2019-04-07 NOTE — Progress Notes (Signed)
LMTCB x2  

## 2019-04-08 ENCOUNTER — Encounter (HOSPITAL_COMMUNITY)
Admission: RE | Admit: 2019-04-08 | Discharge: 2019-04-08 | Disposition: A | Discharge status: Home / Self Care | Payer: Medicare Other | Source: Ambulatory Visit | Attending: Pulmonary Disease | Admitting: Pulmonary Disease

## 2019-04-08 ENCOUNTER — Other Ambulatory Visit: Payer: Self-pay

## 2019-04-08 DIAGNOSIS — R0602 Shortness of breath: Secondary | ICD-10-CM

## 2019-04-08 NOTE — Progress Notes (Signed)
Rodney Burns 74 y.o. male Nutrition Note  Visit Diagnosis: Shortness of breath   Past Medical History:  Diagnosis Date  . Atrial tachycardia ([place])    a. s/p DCCV 09/2009; b. s/p RFCA 03/2011; c. s/p DCCV 04/2011; d. s/p RFCA 09/17/11.  . Cataract   . Chronic systolic CHF (congestive heart failure) ([place])    a. 01/2015 Echo: EF 40-45%, antsept/infsept HK, triv AI, mild MR, mod dil LA, nl RV, mildly dil RA.  [name] CLL (chronic lymphoblastic leukemia)    Stage 0-1  . Coronary artery disease involving native coronary artery without angina pectoris    a. Status post CABG in 1994:By Dr. Cyndia Bent. [place] - L Cx, seqSVG-Diag-LAD, and SVG-PDA-PL.  [name] Cough    Consistant with ACE inhibitor mediated cough  . Dysrhythmia   . Glaucoma (increased eye pressure) 1991  . History of tobacco abuse    quit 1994  . Hypercholesterolemia    Well Controlled  . Hypertension   . Ischemic cardiomyopathy    a. 02/2011 Echo: EF 40-45%;  b. 01/2015 Echo: EF 40-45%.  . Malaria 1972   Hx of  . Pericarditis    a. 01/2015-->Treated w/ ibuprofen/colchicine.  . Sleep-disordered breathing 06/20/2011     Medications reviewed.   Current Outpatient Medications:  .  albuterol (VENTOLIN HFA) 108 (90 Base) MCG/ACT inhaler, Inhale 1 puff into the lungs every 6 (six) hours as needed for wheezing or shortness of breath., Disp: , Rfl:  .  digoxin (LANOXIN) 0.125 MG tablet, Take 1 tablet (0.125 mg total) by mouth daily., Disp: 90 tablet, Rfl: 3 .  Docusate Calcium (STOOL SOFTENER PO), Take by mouth 2 (two) times daily., Disp: , Rfl:  .  ezetimibe-simvastatin (VYTORIN) 10-20 MG tablet, TAKE 1 TABLET AT BEDTIME (KEEP UPCOMING APPOINTMENT FOR REFILLS), Disp: 90 tablet, Rfl: 3 .  Fiber, Guar Gum, CHEW, Chew 3 capsules by mouth daily. , Disp: , Rfl:  .  latanoprost (XALATAN) 0.005 % ophthalmic solution, Place 1 drop into both eyes at bedtime. , Disp: , Rfl:  .  Magnesium 250 MG TABS, Take 500 mg by mouth at bedtime., Disp: , Rfl:  .   metoprolol succinate (TOPROL-XL) 50 MG 24 hr tablet, Take 1 tablet (50 mg) in the mornings and half tablet (25 mg) in the evenings., Disp: 135 tablet, Rfl: 3 .  pramipexole (MIRAPEX) 0.5 MG tablet, Take 1.5 mg by mouth daily. , Disp: , Rfl:  .  predniSONE (DELTASONE) 5 MG tablet, Take 10mg  daily, Disp: 180 tablet, Rfl: 1 .  RABEprazole (ACIPHEX) 20 MG tablet, Take 1 tablet (20 mg total) by mouth daily., Disp: 90 tablet, Rfl: 3 .  sacubitril-valsartan (ENTRESTO) 49-51 MG, Take 1 tablet by mouth 2 (two) times daily., Disp: 180 tablet, Rfl: 3 .  traMADol (ULTRAM) 50 MG tablet, TAKE 1 TABLET EVERY 6 HOURS AS NEEDED FOR MODERATE PAIN, Disp: 120 tablet, Rfl: 0 .  umeclidinium-vilanterol (ANORO ELLIPTA) 62.5-25 MCG/INH AEPB, Inhale 1 puff into the lungs daily., Disp: 180 each, Rfl: 3 .  XARELTO 20 MG TABS tablet, TAKE 1 TABLET DAILY WITH SUPPER, Disp: 90 tablet, Rfl: 3   Ht Readings from Last 1 Encounters:  04/01/19 5\' 10"  (1.778 m)     Wt Readings from Last 3 Encounters:  04/01/19 218 lb 11.1 oz (99.2 kg)  03/30/19 211 lb (95.7 kg)  03/22/19 214 lb 6.4 oz (97.3 kg)     There is no height or weight on file to calculate BMI.   Social  History   Tobacco Use  Smoking Status Former Smoker  . Packs/day: 2.00  . Years: 30.00  . Pack years: 60.00  . Types: Cigarettes  . Quit date: 01/22/1992  . Years since quitting: 27.2  Smokeless Tobacco Never Used     Lab Results  Component Value Date   CHOL 108 10/16/2017   Lab Results  Component Value Date   HDL 42 10/16/2017   Lab Results  Component Value Date   LDLCALC 55 10/16/2017   Lab Results  Component Value Date   TRIG 93 12/24/2018     Lab Results  Component Value Date   HGBA1C 6.7 (H) 12/24/2018     CBG (last 3)  No results for input(s): GLUCAP in the last 72 hours.   Nutrition Note  Spoke with pt. Nutrition Plan and Nutrition Survey goals reviewed with pt. Pt reports wife preparing a low carb diet for him. They both do  the cooking but his wife is reading labels.   Pt has Type 2 Diabetes. Last A1c indicates blood glucose well-controlled.  Not currently prescribed any diabetes meds.  He is motivated to lower his A1C by making diet changes.   Reviewed carbohydrate counting and choosing whole grains.  Pt expressed understanding of the information reviewed.    Nutrition Diagnosis  ? Excessive carbohydrate intake related to consumption of convenience foods and sugary beverages as evidenced by A1C 6.7%   Nutrition Intervention ? Pt's individual nutrition plan reviewed with pt. ? Recommended <75 g carbs per meal and <30 g per snack  ? Continue client-centered nutrition education by RD, as part of interdisciplinary care.  Goal(s) ? Pt to build a healthy plate including vegetables, fruits, whole grains, and low-fat dairy products in a heart healthy meal plan. ? Pt to limit carbohydrates to <75 g per meal and <30 g per snack ? Pt to increase fiber intake to 28-40 g/day  Plan:   Will provide client-centered nutrition education as part of interdisciplinary care  Monitor and evaluate progress toward nutrition goal with team.   Michaele Offer, MS, RDN, LDN

## 2019-04-08 NOTE — Progress Notes (Signed)
Daily Session Note  Patient Details  Name: Rodney Burns MRN: [id number] Date of Birth: [birth year] Referring Provider:     Pulmonary Rehab Walk Test from 04/01/2019 in [place]  Referring Provider  Dr. [name]      Encounter Date: 04/08/2019  Check In: Session Check In - 04/08/19 1126      Check-In   Supervising physician immediately available to respond to emergencies  [facility] immediately available    Physician(s)  Dr. Darrick Meigs    Location  [facility]    Staff Present  Rosebud Poles, RN, Bjorn Loser, MS, Exercise Physiologist;Kathrynn Backstrom Ysidro Evert, RN    Virtual Visit  No    Medication changes reported      No    Fall or balance concerns reported     No    Tobacco Cessation  No Change    Warm-up and Cool-down  Performed as group-led instruction    Resistance Training Performed  Yes    VAD Patient?  No    PAD/SET Patient?  No      Pain Assessment   Currently in Pain?  No/denies    Multiple Pain Sites  No       Capillary Blood Glucose: No results found for this or any previous visit (from the past 24 hour(s)).    Social History   Tobacco Use  Smoking Status Former Smoker  . Packs/day: 2.00  . Years: 30.00  . Pack years: 60.00  . Types: Cigarettes  . Quit date: 01/22/1992  . Years since quitting: 27.2  Smokeless Tobacco Never Used    Goals Met:  Exercise tolerated well No report of cardiac concerns or symptoms Strength training completed today  Goals Unmet:  Not Applicable  Comments: Service time is from 1035 to 1145    Dr. Fransico Him is Medical Director for Cardiac Rehab at [facility].

## 2019-04-09 ENCOUNTER — Other Ambulatory Visit: Payer: Self-pay

## 2019-04-09 ENCOUNTER — Telehealth: Payer: Self-pay | Admitting: Pulmonary Disease

## 2019-04-09 DIAGNOSIS — G4733 Obstructive sleep apnea (adult) (pediatric): Secondary | ICD-10-CM

## 2019-04-09 NOTE — Progress Notes (Signed)
Tried calling the patient for the 3rd time and left a voicemail to return call. I will go ahead and place the order for the CPAP titration study.

## 2019-04-09 NOTE — Telephone Encounter (Signed)
Rodney Garfinkel, MD  Rodney Burns, CMA  Sleep study shows moderate sleep apnea with low O2 levels He will need an CPAP titration study to determine optimal pressure and oxygen needed to treat. Please order   Rodney Burns has already ordered cpap titration study.   Spoke with pt, aware of results/recs.  cpap titration study already ordered.  Pt also requesting PFT results from 03/29/19.  Dr. [name] please advise.  Thanks!

## 2019-04-09 NOTE — Progress Notes (Signed)
See 3/19 phone note

## 2019-04-13 ENCOUNTER — Other Ambulatory Visit: Payer: Self-pay

## 2019-04-13 ENCOUNTER — Encounter (HOSPITAL_COMMUNITY)
Admission: RE | Admit: 2019-04-13 | Discharge: 2019-04-13 | Disposition: A | Discharge status: Home / Self Care | Payer: Medicare Other | Source: Ambulatory Visit | Attending: Pulmonary Disease | Admitting: Pulmonary Disease

## 2019-04-13 DIAGNOSIS — R0602 Shortness of breath: Secondary | ICD-10-CM | POA: Diagnosis not present

## 2019-04-13 NOTE — Telephone Encounter (Signed)
Please let patient know that PFTs show moderate reduction in lung capacity that is consistent with the scarring in the lung from COVID Continue current therapy

## 2019-04-13 NOTE — Telephone Encounter (Signed)
Spoke with pt. He is aware of results. Nothing further was needed. 

## 2019-04-13 NOTE — Progress Notes (Signed)
Pulmonary Individual Treatment Plan  Patient Details  Name: Rodney Burns MRN: [id number] Date of Birth: 09-17-1945 Referring Provider:     Pulmonary Rehab Walk Test from 04/01/2019 in [facility]  Referring Provider  Dr. [name]      Initial Encounter Date:    Pulmonary Rehab Walk Test from 04/01/2019 in [facility]  Date  04/01/19      Visit Diagnosis: Shortness of breath  Patient's Home Medications on Admission:   Current Outpatient Medications:  .  albuterol (VENTOLIN HFA) 108 (90 Base) MCG/ACT inhaler, Inhale 1 puff into the lungs every 6 (six) hours as needed for wheezing or shortness of breath., Disp: , Rfl:  .  digoxin (LANOXIN) 0.125 MG tablet, Take 1 tablet (0.125 mg total) by mouth daily., Disp: 90 tablet, Rfl: 3 .  Docusate Calcium (STOOL SOFTENER PO), Take by mouth 2 (two) times daily., Disp: , Rfl:  .  ezetimibe-simvastatin (VYTORIN) 10-20 MG tablet, TAKE 1 TABLET AT BEDTIME (KEEP UPCOMING APPOINTMENT FOR REFILLS), Disp: 90 tablet, Rfl: 3 .  Fiber, Guar Gum, CHEW, Chew 3 capsules by mouth daily. , Disp: , Rfl:  .  latanoprost (XALATAN) 0.005 % ophthalmic solution, Place 1 drop into both eyes at bedtime. , Disp: , Rfl:  .  Magnesium 250 MG TABS, Take 500 mg by mouth at bedtime., Disp: , Rfl:  .  metoprolol succinate (TOPROL-XL) 50 MG 24 hr tablet, Take 1 tablet (50 mg) in the mornings and half tablet (25 mg) in the evenings., Disp: 135 tablet, Rfl: 3 .  pramipexole (MIRAPEX) 0.5 MG tablet, Take 1.5 mg by mouth daily. , Disp: , Rfl:  .  predniSONE (DELTASONE) 5 MG tablet, Take 10mg  daily, Disp: 180 tablet, Rfl: 1 .  RABEprazole (ACIPHEX) 20 MG tablet, Take 1 tablet (20 mg total) by mouth daily., Disp: 90 tablet, Rfl: 3 .  sacubitril-valsartan (ENTRESTO) 49-51 MG, Take 1 tablet by mouth 2 (two) times daily., Disp: 180 tablet, Rfl: 3 .  traMADol (ULTRAM) 50 MG tablet, TAKE 1 TABLET EVERY 6 HOURS AS NEEDED FOR MODERATE  PAIN, Disp: 120 tablet, Rfl: 0 .  umeclidinium-vilanterol (ANORO ELLIPTA) 62.5-25 MCG/INH AEPB, Inhale 1 puff into the lungs daily., Disp: 180 each, Rfl: 3 .  XARELTO 20 MG TABS tablet, TAKE 1 TABLET DAILY WITH SUPPER, Disp: 90 tablet, Rfl: 3  Past Medical History: Past Medical History:  Diagnosis Date  . Atrial tachycardia ([place])    a. s/p DCCV 09/2009; b. s/p RFCA 03/2011; c. s/p DCCV 04/2011; d. s/p RFCA 09/17/11.  . Cataract   . Chronic systolic CHF (congestive heart failure) ([place])    a. 01/2015 Echo: EF 40-45%, antsept/infsept HK, triv AI, mild MR, mod dil LA, nl RV, mildly dil RA.  [name] CLL (chronic lymphoblastic leukemia)    Stage 0-1  . Coronary artery disease involving native coronary artery without angina pectoris    a. Status post CABG in 1994:By Dr. Cyndia Bent. LIMA - L Cx, seqSVG-Diag-LAD, and SVG-PDA-PL.  [name] Cough    Consistant with ACE inhibitor mediated cough  . Dysrhythmia   . Glaucoma (increased eye pressure) 1991  . History of tobacco abuse    quit 1994  . Hypercholesterolemia    Well Controlled  . Hypertension   . Ischemic cardiomyopathy    a. 02/2011 Echo: EF 40-45%;  b. 01/2015 Echo: EF 40-45%.  . Malaria 1972   Hx of  . Pericarditis    a. 01/2015-->Treated w/ ibuprofen/colchicine.  [name]  Sleep-disordered breathing 06/20/2011    Tobacco Use: Social History   Tobacco Use  Smoking Status Former Smoker  . Packs/day: 2.00  . Years: 30.00  . Pack years: 60.00  . Types: Cigarettes  . Quit date: 01/22/1992  . Years since quitting: 27.2  Smokeless Tobacco Never Used    Labs: Recent Chemical engineer    Labs for ITP Cardiac and Pulmonary Rehab Latest Ref Rng & Units 02/21/2017 10/16/2017 12/24/2018   Cholestrol 100 - 199 mg/dL 142 108 -   LDLCALC 0 - 99 mg/dL - 55 -   HDL >39 mg/dL - 42 -   Trlycerides <150 mg/dL - 56 93   Hemoglobin A1c 4.8 - 5.6 % - - 6.7(H)      Capillary Blood Glucose: Lab Results  Component Value Date   GLUCAP 114 (H) 12/30/2018   GLUCAP 154  (H) 12/30/2018   GLUCAP 129 (H) 12/30/2018   GLUCAP 73 12/30/2018   GLUCAP 170 (H) 12/29/2018     Pulmonary Assessment Scores: Pulmonary Assessment Scores    Row Name 04/01/19 1508 04/01/19 1549       ADL UCSD   ADL Phase  Entry  Entry    SOB Score total  32  --      CAT Score   CAT Score  26  --      mMRC Score   mMRC Score  --  2      UCSD: Self-administered rating of dyspnea associated with activities of daily living (ADLs) 6-point scale (0 = "not at all" to 5 = "maximal or unable to do because of breathlessness")  Scoring Scores range from 0 to 120.  Minimally important difference is 5 units  CAT: CAT can identify the health impairment of COPD patients and is better correlated with disease progression.  CAT has a scoring range of zero to 40. The CAT score is classified into four groups of low (less than 10), medium (10 - 20), high (21-30) and very high (31-40) based on the impact level of disease on health status. A CAT score over 10 suggests significant symptoms.  A worsening CAT score could be explained by an exacerbation, poor medication adherence, poor inhaler technique, or progression of COPD or comorbid conditions.  CAT MCID is 2 points  mMRC: mMRC (Modified Medical Research Council) Dyspnea Scale is used to assess the degree of baseline functional disability in patients of respiratory disease due to dyspnea. No minimal important difference is established. A decrease in score of 1 point or greater is considered a positive change.   Pulmonary Function Assessment: Pulmonary Function Assessment - 04/01/19 1452      Breath   Bilateral Breath Sounds  Clear    Shortness of Breath  Yes;Limiting activity       Exercise Target Goals: Exercise Program Goal: Individual exercise prescription set using results from initial 6 min walk test and THRR while considering  patient's activity barriers and safety.   Exercise Prescription Goal: Initial exercise prescription  builds to 30-45 minutes a day of aerobic activity, 2-3 days per week.  Home exercise guidelines will be given to patient during program as part of exercise prescription that the participant will acknowledge.  Activity Barriers & Risk Stratification: Activity Barriers & Cardiac Risk Stratification - 04/01/19 1448      Activity Barriers & Cardiac Risk Stratification   Activity Barriers  Muscular Weakness;Shortness of Breath;Deconditioning       6 Minute Walk: 6 Minute Walk  Row Name 04/01/19 1549         6 Minute Walk   Phase  Initial     Distance  1332 feet     Walk Time  6 minutes     # of Rest Breaks  0     MPH  2.52     METS  2.79     RPE  13     Perceived Dyspnea   2     VO2 Peak  9.76     Symptoms  No     Resting HR  74 bpm     Resting BP  124/64     Resting Oxygen Saturation   97 %     Exercise Oxygen Saturation  during 6 min walk  92 %     Max Ex. HR  114 bpm     Max Ex. BP  144/66     2 Minute Post BP  130/68       Interval HR   1 Minute HR  112     2 Minute HR  114     3 Minute HR  113     4 Minute HR  111     5 Minute HR  111     6 Minute HR  112     2 Minute Post HR  76     Interval Heart Rate?  Yes       Interval Oxygen   Interval Oxygen?  Yes     Baseline Oxygen Saturation %  97 %     1 Minute Oxygen Saturation %  95 %     1 Minute Liters of Oxygen  4 L     2 Minute Oxygen Saturation %  93 %     2 Minute Liters of Oxygen  4 L     3 Minute Oxygen Saturation %  93 %     3 Minute Liters of Oxygen  4 L     4 Minute Oxygen Saturation %  93 %     4 Minute Liters of Oxygen  4 L     5 Minute Oxygen Saturation %  92 %     5 Minute Liters of Oxygen  4 L     6 Minute Oxygen Saturation %  92 %     6 Minute Liters of Oxygen  4 L     2 Minute Post Oxygen Saturation %  97 %     2 Minute Post Liters of Oxygen  2 L        Oxygen Initial Assessment: Oxygen Initial Assessment - 04/01/19 1548      Home Oxygen   Home Oxygen Device  Home  Concentrator;E-Tanks    Sleep Oxygen Prescription  Continuous    Liters per minute  2    Home Exercise Oxygen Prescription  Continuous    Liters per minute  4    Home at Rest Exercise Oxygen Prescription  None    Compliance with Home Oxygen Use  Yes      Initial 6 min Walk   Oxygen Used  Continuous    Liters per minute  4      Program Oxygen Prescription   Program Oxygen Prescription  Continuous    Liters per minute  4      Intervention   Short Term Goals  To learn and exhibit compliance with exercise, home and travel O2 prescription;To learn and understand importance of monitoring  SPO2 with pulse oximeter and demonstrate accurate use of the pulse oximeter.;To learn and understand importance of maintaining oxygen saturations>88%;To learn and demonstrate proper pursed lip breathing techniques or other breathing techniques.;To learn and demonstrate proper use of respiratory medications    Long  Term Goals  Exhibits compliance with exercise, home and travel O2 prescription;Verbalizes importance of monitoring SPO2 with pulse oximeter and return demonstration;Maintenance of O2 saturations>88%;Exhibits proper breathing techniques, such as pursed lip breathing or other method taught during program session;Compliance with respiratory medication;Demonstrates proper use of MDI's       Oxygen Re-Evaluation: Oxygen Re-Evaluation    Row Name 04/13/19 0711             Program Oxygen Prescription   Program Oxygen Prescription  Continuous         Home Oxygen   Home Oxygen Device  Home Concentrator;E-Tanks       Sleep Oxygen Prescription  Continuous       Liters per minute  2       Home Exercise Oxygen Prescription  Continuous       Liters per minute  4       Home at Rest Exercise Oxygen Prescription  None         Goals/Expected Outcomes   Short Term Goals  To learn and exhibit compliance with exercise, home and travel O2 prescription;To learn and understand importance of monitoring SPO2  with pulse oximeter and demonstrate accurate use of the pulse oximeter.;To learn and understand importance of maintaining oxygen saturations>88%;To learn and demonstrate proper pursed lip breathing techniques or other breathing techniques.;To learn and demonstrate proper use of respiratory medications       Long  Term Goals  Exhibits compliance with exercise, home and travel O2 prescription;Verbalizes importance of monitoring SPO2 with pulse oximeter and return demonstration;Maintenance of O2 saturations>88%;Exhibits proper breathing techniques, such as pursed lip breathing or other method taught during program session;Compliance with respiratory medication;Demonstrates proper use of MDI's       Goals/Expected Outcomes  compliance          Oxygen Discharge (Final Oxygen Re-Evaluation): Oxygen Re-Evaluation - 04/13/19 0711      Program Oxygen Prescription   Program Oxygen Prescription  Continuous      Home Oxygen   Home Oxygen Device  Home Concentrator;E-Tanks    Sleep Oxygen Prescription  Continuous    Liters per minute  2    Home Exercise Oxygen Prescription  Continuous    Liters per minute  4    Home at Rest Exercise Oxygen Prescription  None      Goals/Expected Outcomes   Short Term Goals  To learn and exhibit compliance with exercise, home and travel O2 prescription;To learn and understand importance of monitoring SPO2 with pulse oximeter and demonstrate accurate use of the pulse oximeter.;To learn and understand importance of maintaining oxygen saturations>88%;To learn and demonstrate proper pursed lip breathing techniques or other breathing techniques.;To learn and demonstrate proper use of respiratory medications    Long  Term Goals  Exhibits compliance with exercise, home and travel O2 prescription;Verbalizes importance of monitoring SPO2 with pulse oximeter and return demonstration;Maintenance of O2 saturations>88%;Exhibits proper breathing techniques, such as pursed lip breathing  or other method taught during program session;Compliance with respiratory medication;Demonstrates proper use of MDI's    Goals/Expected Outcomes  compliance       Initial Exercise Prescription: Initial Exercise Prescription - 04/01/19 1500      Date of Initial Exercise RX and Referring Provider  Date  04/01/19    Referring Provider  Dr. [name]      Oxygen   Oxygen  Continuous    Liters  4      Treadmill   MPH  1.6    Grade  0    Minutes  15      NuStep   Level  2    SPM  80    Minutes  15      Prescription Details   Frequency (times per week)  2    Duration  Progress to 30 minutes of continuous aerobic without signs/symptoms of physical distress      Intensity   THRR 40-80% of Max Heartrate  58-117    Ratings of Perceived Exertion  11-13    Perceived Dyspnea  0-4      Progression   Progression  Continue progressive overload as per policy without signs/symptoms or physical distress.      Resistance Training   Training Prescription  Yes    Weight  blue bands    Reps  10-15       Perform Capillary Blood Glucose checks as needed.  Exercise Prescription Changes:   Exercise Comments:   Exercise Goals and Review: Exercise Goals    Row Name 04/01/19 1556 04/13/19 0712           Exercise Goals   Increase Physical Activity  Yes  Yes      Intervention  Provide advice, education, support and counseling about physical activity/exercise needs.;Develop an individualized exercise prescription for aerobic and resistive training based on initial evaluation findings, risk stratification, comorbidities and participant's personal goals.  Provide advice, education, support and counseling about physical activity/exercise needs.;Develop an individualized exercise prescription for aerobic and resistive training based on initial evaluation findings, risk stratification, comorbidities and participant's personal goals.      Expected Outcomes  Short Term: Attend rehab on a regular  basis to increase amount of physical activity.;Long Term: Add in home exercise to make exercise part of routine and to increase amount of physical activity.;Long Term: Exercising regularly at least 3-5 days a week.  Short Term: Attend rehab on a regular basis to increase amount of physical activity.;Long Term: Add in home exercise to make exercise part of routine and to increase amount of physical activity.;Long Term: Exercising regularly at least 3-5 days a week.      Increase Strength and Stamina  Yes  Yes      Intervention  Provide advice, education, support and counseling about physical activity/exercise needs.;Develop an individualized exercise prescription for aerobic and resistive training based on initial evaluation findings, risk stratification, comorbidities and participant's personal goals.  Provide advice, education, support and counseling about physical activity/exercise needs.;Develop an individualized exercise prescription for aerobic and resistive training based on initial evaluation findings, risk stratification, comorbidities and participant's personal goals.      Expected Outcomes  Short Term: Increase workloads from initial exercise prescription for resistance, speed, and METs.;Short Term: Perform resistance training exercises routinely during rehab and add in resistance training at home;Long Term: Improve cardiorespiratory fitness, muscular endurance and strength as measured by increased METs and functional capacity (6MWT)  Short Term: Increase workloads from initial exercise prescription for resistance, speed, and METs.;Short Term: Perform resistance training exercises routinely during rehab and add in resistance training at home;Long Term: Improve cardiorespiratory fitness, muscular endurance and strength as measured by increased METs and functional capacity (6MWT)      Able to understand and use rate of perceived  exertion (RPE) scale  Yes  Yes      Intervention  Provide education and  explanation on how to use RPE scale  Provide education and explanation on how to use RPE scale      Expected Outcomes  Short Term: Able to use RPE daily in rehab to express subjective intensity level;Long Term:  Able to use RPE to guide intensity level when exercising independently  Short Term: Able to use RPE daily in rehab to express subjective intensity level;Long Term:  Able to use RPE to guide intensity level when exercising independently      Able to understand and use Dyspnea scale  Yes  Yes      Intervention  Provide education and explanation on how to use Dyspnea scale  Provide education and explanation on how to use Dyspnea scale      Expected Outcomes  Short Term: Able to use Dyspnea scale daily in rehab to express subjective sense of shortness of breath during exertion;Long Term: Able to use Dyspnea scale to guide intensity level when exercising independently  Short Term: Able to use Dyspnea scale daily in rehab to express subjective sense of shortness of breath during exertion;Long Term: Able to use Dyspnea scale to guide intensity level when exercising independently      Knowledge and understanding of Target Heart Rate Range (THRR)  Yes  Yes      Intervention  Provide education and explanation of THRR including how the numbers were predicted and where they are located for reference  Provide education and explanation of THRR including how the numbers were predicted and where they are located for reference      Expected Outcomes  Short Term: Able to state/look up THRR;Short Term: Able to use daily as guideline for intensity in rehab;Long Term: Able to use THRR to govern intensity when exercising independently  Short Term: Able to state/look up THRR;Short Term: Able to use daily as guideline for intensity in rehab;Long Term: Able to use THRR to govern intensity when exercising independently      Understanding of Exercise Prescription  Yes  Yes      Intervention  Provide education, explanation,  and written materials on patient's individual exercise prescription  Provide education, explanation, and written materials on patient's individual exercise prescription      Expected Outcomes  Short Term: Able to explain program exercise prescription  Short Term: Able to explain program exercise prescription         Exercise Goals Re-Evaluation : Exercise Goals Re-Evaluation    Row Name 04/13/19 0712             Exercise Goal Re-Evaluation   Exercise Goals Review  Increase Physical Activity;Increase Strength and Stamina;Able to understand and use rate of perceived exertion (RPE) scale;Able to understand and use Dyspnea scale;Knowledge and understanding of Target Heart Rate Range (THRR);Understanding of Exercise Prescription       Comments  Pt has attended 2 exercise sessions. Pt is motivated and I expect him to progress well. He currently exercises at 2.2 METs on the stepper. Will continue to monitor and progress as able.       Expected Outcomes  Through exercise at rehab and at home, the patient will decrease shortness of breath with daily activities and feel confident in carrying out an exercise regime at home.          Discharge Exercise Prescription (Final Exercise Prescription Changes):   Nutrition:  Target Goals: Understanding of nutrition guidelines, daily intake  of sodium 1500mg , cholesterol 200mg , calories 30% from fat and 7% or less from saturated fats, daily to have 5 or more servings of fruits and vegetables.  Biometrics: Pre Biometrics - 04/01/19 1449      Pre Biometrics   Height  5\' 10"  (1.778 m)    Weight  99.2 kg    BMI (Calculated)  31.38    Grip Strength  40 kg        Nutrition Therapy Plan and Nutrition Goals: Nutrition Therapy & Goals - 04/12/19 1023      Nutrition Therapy   Diet  Carb modified      Personal Nutrition Goals   Nutrition Goal  Pt to build a healthy plate including vegetables, fruits, whole grains, and low-fat dairy products in a heart  healthy meal plan.    Personal Goal #2  Pt to limit carbohydrates to <75 g per meal and <30 g per snack    Personal Goal #3  Pt to increase fiber intake to 28-40 g/day      Intervention Plan   Intervention  Prescribe, educate and counsel regarding individualized specific dietary modifications aiming towards targeted core components such as weight, hypertension, lipid management, diabetes, heart failure and other comorbidities.;Nutrition handout(s) given to patient.    Expected Outcomes  Short Term Goal: A plan has been developed with personal nutrition goals set during dietitian appointment.;Long Term Goal: Adherence to prescribed nutrition plan.       Nutrition Assessments:   Nutrition Goals Re-Evaluation: Nutrition Goals Re-Evaluation    [place] Name 04/12/19 1023             Goals   Current Weight  218 lb (98.9 kg)          Nutrition Goals Discharge (Final Nutrition Goals Re-Evaluation): Nutrition Goals Re-Evaluation - 04/12/19 1023      Goals   Current Weight  218 lb (98.9 kg)       Psychosocial: Target Goals: Acknowledge presence or absence of significant depression and/or stress, maximize coping skills, provide positive support system. Participant is able to verbalize types and ability to use techniques and skills needed for reducing stress and depression.  Initial Review & Psychosocial Screening: Initial Psych Review & Screening - 04/01/19 1510      Initial Review   Current issues with  None Identified      Family Dynamics   Good Support System?  Yes      Barriers   Psychosocial barriers to participate in program  There are no identifiable barriers or psychosocial needs.      Screening Interventions   Interventions  Encouraged to exercise       Quality of Life Scores:  Scores of 19 and below usually indicate a poorer quality of life in these areas.  A difference of  2-3 points is a clinically meaningful difference.  A difference of 2-3 points in the total  score of the Quality of Life Index has been associated with significant improvement in overall quality of life, self-image, physical symptoms, and general health in studies assessing change in quality of life.  PHQ-9: Recent Review Flowsheet Data    Depression screen [facility] 2/9 04/01/2019   Decreased Interest 0   Down, Depressed, Hopeless 0   PHQ - 2 Score 0   Altered sleeping 0   Tired, decreased energy 0   Change in appetite 0   Feeling bad or failure about yourself  0   Trouble concentrating 0   Moving slowly or  fidgety/restless 0   Suicidal thoughts 0   Difficult doing work/chores Not difficult at all     Interpretation of Total Score  Total Score Depression Severity:  1-4 = Minimal depression, 5-9 = Mild depression, 10-14 = Moderate depression, 15-19 = Moderately severe depression, 20-27 = Severe depression   Psychosocial Evaluation and Intervention: Psychosocial Evaluation - 04/01/19 1510      Psychosocial Evaluation & Interventions   Interventions  Encouraged to exercise with the program and follow exercise prescription    Continue Psychosocial Services   No Follow up required       Psychosocial Re-Evaluation: Psychosocial Re-Evaluation    [place] Name 04/12/19 1313             Psychosocial Re-Evaluation   Current issues with  None Identified       Comments  No psychosocial concerns identifed at this time.       Expected Outcomes  That patient will continue to have no barriers or psychosocial concerns while participating in pulmonary rehab.          Psychosocial Discharge (Final Psychosocial Re-Evaluation): Psychosocial Re-Evaluation - 04/12/19 1313      Psychosocial Re-Evaluation   Current issues with  None Identified    Comments  No psychosocial concerns identifed at this time.    Expected Outcomes  That patient will continue to have no barriers or psychosocial concerns while participating in pulmonary rehab.       Education: Education Goals: Education  classes will be provided on a weekly basis, covering required topics. Participant will state understanding/return demonstration of topics presented.  Learning Barriers/Preferences: Learning Barriers/Preferences - 04/01/19 1511      Learning Barriers/Preferences   Learning Barriers  None    Learning Preferences  Audio;Computer/Internet;Group Instruction;Individual Instruction;Pictoral;Skilled Demonstration;Verbal Instruction;Video;Written Material       Education Topics: Risk Factor Reduction:  -Group instruction that is supported by a PowerPoint presentation. Instructor discusses the definition of a risk factor, different risk factors for pulmonary disease, and how the heart and lungs work together.     Nutrition for Pulmonary Patient:  -Group instruction provided by PowerPoint slides, verbal discussion, and written materials to support subject matter. The instructor gives an explanation and review of healthy diet recommendations, which includes a discussion on weight management, recommendations for fruit and vegetable consumption, as well as protein, fluid, caffeine, fiber, sodium, sugar, and alcohol. Tips for eating when patients are short of breath are discussed.   Pursed Lip Breathing:  -Group instruction that is supported by demonstration and informational handouts. Instructor discusses the benefits of pursed lip and diaphragmatic breathing and detailed demonstration on how to preform both.     Oxygen Safety:  -Group instruction provided by PowerPoint, verbal discussion, and written material to support subject matter. There is an overview of "What is Oxygen" and "Why do we need it".  Instructor also reviews how to create a safe environment for oxygen use, the importance of using oxygen as prescribed, and the risks of noncompliance. There is a brief discussion on traveling with oxygen and resources the patient may utilize.   Oxygen Equipment:  -Group instruction provided by [facility] Staff utilizing handouts, written materials, and equipment demonstrations.   Signs and Symptoms:  -Group instruction provided by written material and verbal discussion to support subject matter. Warning signs and symptoms of infection, stroke, and heart attack are reviewed and when to call the physician/911 reinforced. Tips for preventing the spread of infection discussed.   Advanced Directives:  -  Group instruction provided by verbal instruction and written material to support subject matter. Instructor reviews Advanced Directive laws and proper instruction for filling out document.   Pulmonary Video:  -Group video education that reviews the importance of medication and oxygen compliance, exercise, good nutrition, pulmonary hygiene, and pursed lip and diaphragmatic breathing for the pulmonary patient.   Exercise for the Pulmonary Patient:  -Group instruction that is supported by a PowerPoint presentation. Instructor discusses benefits of exercise, core components of exercise, frequency, duration, and intensity of an exercise routine, importance of utilizing pulse oximetry during exercise, safety while exercising, and options of places to exercise outside of rehab.     Pulmonary Medications:  -Verbally interactive group education provided by instructor with focus on inhaled medications and proper administration.   Anatomy and Physiology of the Respiratory System and Intimacy:  -Group instruction provided by PowerPoint, verbal discussion, and written material to support subject matter. Instructor reviews respiratory cycle and anatomical components of the respiratory system and their functions. Instructor also reviews differences in obstructive and restrictive respiratory diseases with examples of each. Intimacy, Sex, and Sexuality differences are reviewed with a discussion on how relationships can change when diagnosed with pulmonary disease. Common sexual concerns are reviewed.   MD  DAY -A group question and answer session with a medical doctor that allows participants to ask questions that relate to their pulmonary disease state.   OTHER EDUCATION -Group or individual verbal, written, or video instructions that support the educational goals of the pulmonary rehab program.   Holiday Eating Survival Tips:  -Group instruction provided by PowerPoint slides, verbal discussion, and written materials to support subject matter. The instructor gives patients tips, tricks, and techniques to help them not only survive but enjoy the holidays despite the onslaught of food that accompanies the holidays.   Knowledge Questionnaire Score: Knowledge Questionnaire Score - 04/01/19 1510      Knowledge Questionnaire Score   Pre Score  14/18       Core Components/Risk Factors/Patient Goals at Admission: Personal Goals and Risk Factors at Admission - 04/01/19 1511      Core Components/Risk Factors/Patient Goals on Admission   Improve shortness of breath with ADL's  Yes    Intervention  Provide education, individualized exercise plan and daily activity instruction to help decrease symptoms of SOB with activities of daily living.    Expected Outcomes  Short Term: Improve cardiorespiratory fitness to achieve a reduction of symptoms when performing ADLs;Long Term: Be able to perform more ADLs without symptoms or delay the onset of symptoms       Core Components/Risk Factors/Patient Goals Review:  Goals and Risk Factor Review    Row Name 04/01/19 1512 04/12/19 1314           Core Components/Risk Factors/Patient Goals Review   Personal Goals Review  Develop more efficient breathing techniques such as purse lipped breathing and diaphragmatic breathing and practicing self-pacing with activity.;Increase knowledge of respiratory medications and ability to use respiratory devices properly.;Improve shortness of breath with ADL's  Develop more efficient breathing techniques such as purse  lipped breathing and diaphragmatic breathing and practicing self-pacing with activity.;Increase knowledge of respiratory medications and ability to use respiratory devices properly.;Improve shortness of breath with ADL's      Review  --  Tramine just started program, has attended 2 exercise sessions, is currently walking on the treadmill @ 1.6 mph and 1% incline, and level 3 on the nustep.  He is very positive and seems to enjoy  participating.      Expected Outcomes  --  See admission goals         Core Components/Risk Factors/Patient Goals at Discharge (Final Review):  Goals and Risk Factor Review - 04/12/19 1314      Core Components/Risk Factors/Patient Goals Review   Personal Goals Review  Develop more efficient breathing techniques such as purse lipped breathing and diaphragmatic breathing and practicing self-pacing with activity.;Increase knowledge of respiratory medications and ability to use respiratory devices properly.;Improve shortness of breath with ADL's    Review  Jovian just started program, has attended 2 exercise sessions, is currently walking on the treadmill @ 1.6 mph and 1% incline, and level 3 on the nustep.  He is very positive and seems to enjoy participating.    Expected Outcomes  See admission goals       ITP Comments:   Comments: ITP REVIEW Pt is making expected progress toward pulmonary rehab goals after completing 2 sessions. Recommend continued exercise, life style modification, education, and utilization of breathing techniques to increase stamina and strength and decrease shortness of breath with exertion.

## 2019-04-13 NOTE — Progress Notes (Signed)
Daily Session Note  Patient Details  Name: Rodney Burns MRN: [id number] Date of Birth: [birth year] Referring Provider:     Pulmonary Rehab Walk Test from 04/01/2019 in [place]  Referring Provider  Dr. [name]      Encounter Date: 04/13/2019  Check In: Session Check In - 04/13/19 1213      Check-In   Supervising physician immediately available to respond to emergencies  [facility] immediately available    Physician(s)  Dr. Darrick Meigs    Location  [facility]    Staff Present  Rodney Langton, RN;Dalton Kris Mouton, MS, Exercise Physiologist;Jyden Kromer Leonia Reeves, RN, BSN    Virtual Visit  No    Medication changes reported      No    Fall or balance concerns reported     No    Tobacco Cessation  No Change    Warm-up and Cool-down  Performed as group-led instruction    Resistance Training Performed  Yes    VAD Patient?  No    PAD/SET Patient?  No      Pain Assessment   Currently in Pain?  No/denies    Multiple Pain Sites  No       Capillary Blood Glucose: No results found for this or any previous visit (from the past 24 hour(s)).  Exercise Prescription Changes - 04/13/19 1200      Response to Exercise   Blood Pressure (Admit)  104/64    Blood Pressure (Exercise)  130/54    Blood Pressure (Exit)  114/64    Heart Rate (Admit)  74 bpm    Heart Rate (Exercise)  109 bpm    Heart Rate (Exit)  74 bpm    Oxygen Saturation (Admit)  97 %    Oxygen Saturation (Exercise)  97 %    Oxygen Saturation (Exit)  98 %    Rating of Perceived Exertion (Exercise)  13    Perceived Dyspnea (Exercise)  2    Duration  Continue with 30 min of aerobic exercise without signs/symptoms of physical distress.    Intensity  --   40-80% HRR     Progression   Progression  Continue to progress workloads to maintain intensity without signs/symptoms of physical distress.      Resistance Training   Training Prescription  Yes    Weight  blue bands    Reps  10-15    Time  10 Minutes      Interval Training   Interval Training  No      Oxygen   Oxygen  Continuous    Liters  4      Treadmill   MPH  1.6    Grade  1    Minutes  15      NuStep   Level  3    SPM  80    Minutes  15    METs  2.3       Social History   Tobacco Use  Smoking Status Former Smoker  . Packs/day: 2.00  . Years: 30.00  . Pack years: 60.00  . Types: Cigarettes  . Quit date: 01/22/1992  . Years since quitting: 27.2  Smokeless Tobacco Never Used    Goals Met:  Proper associated with RPD/PD & O2 Sat Exercise tolerated well Strength training completed today  Goals Unmet:  Not Applicable  Comments: Service time is from 1035  to 1135    Dr. Fransico Him is Medical Director for Cardiac  Rehab at [facility].

## 2019-04-14 ENCOUNTER — Ambulatory Visit (HOSPITAL_COMMUNITY): Payer: Medicare Other | Attending: Cardiology

## 2019-04-14 DIAGNOSIS — I4892 Unspecified atrial flutter: Secondary | ICD-10-CM | POA: Diagnosis not present

## 2019-04-14 DIAGNOSIS — I25810 Atherosclerosis of coronary artery bypass graft(s) without angina pectoris: Secondary | ICD-10-CM | POA: Insufficient documentation

## 2019-04-14 DIAGNOSIS — I5022 Chronic systolic (congestive) heart failure: Secondary | ICD-10-CM | POA: Diagnosis not present

## 2019-04-14 DIAGNOSIS — I10 Essential (primary) hypertension: Secondary | ICD-10-CM | POA: Insufficient documentation

## 2019-04-14 DIAGNOSIS — E785 Hyperlipidemia, unspecified: Secondary | ICD-10-CM | POA: Diagnosis not present

## 2019-04-14 MED ORDER — PERFLUTREN LIPID MICROSPHERE
1.0000 mL | INTRAVENOUS | Status: AC | PRN
  Administered 2019-04-14: 3 mL via INTRAVENOUS

## 2019-04-15 ENCOUNTER — Other Ambulatory Visit: Payer: Self-pay

## 2019-04-15 ENCOUNTER — Encounter (HOSPITAL_COMMUNITY)
Admission: RE | Admit: 2019-04-15 | Discharge: 2019-04-15 | Disposition: A | Discharge status: Home / Self Care | Payer: Medicare Other | Source: Ambulatory Visit | Attending: Pulmonary Disease | Admitting: Pulmonary Disease

## 2019-04-15 DIAGNOSIS — R0602 Shortness of breath: Secondary | ICD-10-CM | POA: Diagnosis not present

## 2019-04-15 DIAGNOSIS — I10 Essential (primary) hypertension: Secondary | ICD-10-CM

## 2019-04-15 DIAGNOSIS — I5022 Chronic systolic (congestive) heart failure: Secondary | ICD-10-CM

## 2019-04-15 MED ORDER — SPIRONOLACTONE 25 MG PO TABS: ORAL_TABLET | ORAL | 3 refills | Status: DC

## 2019-04-15 NOTE — Progress Notes (Signed)
aldactone

## 2019-04-15 NOTE — Progress Notes (Signed)
Daily Session Note  Patient Details  Name: Rodney Burns MRN: [id number] Date of Birth: Jun 26, 1945 Referring Provider:     Pulmonary Rehab Walk Test from 04/01/2019 in [place]  Referring Provider  Dr. [name]      Encounter Date: 04/15/2019  Check In: Session Check In - 04/15/19 1151      Check-In   Supervising physician immediately available to respond to emergencies  [facility] immediately available    Physician(s)  Dr. Marylyn Ishihara    Location  [facility]    Staff Present  Rosebud Poles, RN, Bjorn Loser, MS, Exercise Physiologist    Virtual Visit  No    Medication changes reported      No    Fall or balance concerns reported     No    Tobacco Cessation  No Change    Warm-up and Cool-down  Performed as group-led instruction    Resistance Training Performed  Yes    VAD Patient?  No    PAD/SET Patient?  No      Pain Assessment   Currently in Pain?  No/denies    Multiple Pain Sites  No       Capillary Blood Glucose: No results found for this or any previous visit (from the past 24 hour(s)).    Social History   Tobacco Use  Smoking Status Former Smoker  . Packs/day: 2.00  . Years: 30.00  . Pack years: 60.00  . Types: Cigarettes  . Quit date: 01/22/1992  . Years since quitting: 27.2  Smokeless Tobacco Never Used    Goals Met:  Proper associated with RPD/PD & O2 Sat Exercise tolerated well Strength training completed today  Goals Unmet:  Not Applicable  Comments: Service time is from 1040 to 2    Dr. Fransico Him is Medical Director for Cardiac Rehab at [facility].

## 2019-04-20 ENCOUNTER — Encounter (HOSPITAL_COMMUNITY)
Admission: RE | Admit: 2019-04-20 | Discharge: 2019-04-20 | Disposition: A | Discharge status: Home / Self Care | Payer: Medicare Other | Source: Ambulatory Visit | Attending: Pulmonary Disease | Admitting: Pulmonary Disease

## 2019-04-20 ENCOUNTER — Other Ambulatory Visit: Payer: Self-pay

## 2019-04-20 DIAGNOSIS — R0602 Shortness of breath: Secondary | ICD-10-CM

## 2019-04-20 NOTE — Progress Notes (Signed)
Daily Session Note  Patient Details  Name: Rodney Burns MRN: [id number] Date of Birth: 05-Apr-1945 Referring Provider:     Pulmonary Rehab Walk Test from 04/01/2019 in [place]  Referring Provider  Dr. [name]      Encounter Date: 04/20/2019  Check In: Session Check In - 04/20/19 1140      Check-In   Supervising physician immediately available to respond to emergencies  [facility] immediately available    Physician(s)  Dr. Sharlet Salina    Location  [facility]    Staff Present  Rosebud Poles, RN, Bjorn Loser, MS, Exercise Physiologist;Alonah Lineback Ysidro Evert, RN    Virtual Visit  No    Medication changes reported      No    Fall or balance concerns reported     No    Tobacco Cessation  No Change    Warm-up and Cool-down  Performed as group-led instruction    Resistance Training Performed  Yes    VAD Patient?  No    PAD/SET Patient?  No      Pain Assessment   Currently in Pain?  No/denies    Multiple Pain Sites  No       Capillary Blood Glucose: No results found for this or any previous visit (from the past 24 hour(s)).    Social History   Tobacco Use  Smoking Status Former Smoker  . Packs/day: 2.00  . Years: 30.00  . Pack years: 60.00  . Types: Cigarettes  . Quit date: 01/22/1992  . Years since quitting: 27.2  Smokeless Tobacco Never Used    Goals Met:  Exercise tolerated well No report of cardiac concerns or symptoms Strength training completed today  Goals Unmet:  Not Applicable  Comments: Service time is from 1035 to 1125    Dr. Fransico Him is Medical Director for Cardiac Rehab at [facility].

## 2019-04-22 ENCOUNTER — Encounter (HOSPITAL_COMMUNITY)
Admission: RE | Admit: 2019-04-22 | Discharge: 2019-04-22 | Disposition: A | Discharge status: Home / Self Care | Payer: Medicare Other | Source: Ambulatory Visit | Attending: Pulmonary Disease | Admitting: Pulmonary Disease

## 2019-04-22 ENCOUNTER — Other Ambulatory Visit: Payer: Self-pay

## 2019-04-22 VITALS — Wt 217.6 lb

## 2019-04-22 DIAGNOSIS — R0602 Shortness of breath: Secondary | ICD-10-CM | POA: Diagnosis not present

## 2019-04-22 NOTE — Progress Notes (Signed)
Daily Session Note  Patient Details  Name: Rodney Burns MRN: [id number] Date of Birth: 15-Mar-1945 Referring Provider:     Pulmonary Rehab Walk Test from 04/01/2019 in [place]  Referring Provider  Dr. [name]      Encounter Date: 04/22/2019  Check In: Session Check In - 04/22/19 1121      Check-In   Supervising physician immediately available to respond to emergencies  [facility] immediately available    Physician(s)  Dr. Loleta Books    Location  [facility]    Staff Present  Rosebud Poles, RN, Bjorn Loser, MS, Exercise Physiologist;[name], RN    Virtual Visit  No    Medication changes reported      No    Fall or balance concerns reported     No    Tobacco Cessation  No Change    Warm-up and Cool-down  Performed as group-led instruction    Resistance Training Performed  Yes    VAD Patient?  No    PAD/SET Patient?  No      Pain Assessment   Currently in Pain?  No/denies    Multiple Pain Sites  No       Capillary Blood Glucose: No results found for this or any previous visit (from the past 24 hour(s)).    Social History   Tobacco Use  Smoking Status Former Smoker  . Packs/day: 2.00  . Years: 30.00  . Pack years: 60.00  . Types: Cigarettes  . Quit date: 01/22/1992  . Years since quitting: 27.2  Smokeless Tobacco Never Used    Goals Met:  Exercise tolerated well No report of cardiac concerns or symptoms Strength training completed today  Goals Unmet:  Not Applicable  Comments: Service time is from 1040 to 1135    Dr. Fransico Him is Medical Director for Cardiac Rehab at [facility].

## 2019-04-27 ENCOUNTER — Other Ambulatory Visit (HOSPITAL_COMMUNITY): Payer: Medicare Other

## 2019-04-27 ENCOUNTER — Encounter (HOSPITAL_COMMUNITY): Payer: Medicare Other

## 2019-04-29 ENCOUNTER — Encounter (HOSPITAL_COMMUNITY): Payer: Medicare Other

## 2019-04-29 ENCOUNTER — Encounter (HOSPITAL_BASED_OUTPATIENT_CLINIC_OR_DEPARTMENT_OTHER): Payer: Medicare Other | Admitting: Pulmonary Disease

## 2019-05-03 ENCOUNTER — Other Ambulatory Visit (HOSPITAL_COMMUNITY)
Admission: RE | Admit: 2019-05-03 | Discharge: 2019-05-03 | Disposition: A | Discharge status: Home / Self Care | Payer: Medicare Other | Source: Ambulatory Visit | Attending: Pulmonary Disease | Admitting: Pulmonary Disease

## 2019-05-03 ENCOUNTER — Telehealth: Payer: Self-pay | Admitting: Cardiology

## 2019-05-03 DIAGNOSIS — Z20822 Contact with and (suspected) exposure to covid-19: Secondary | ICD-10-CM | POA: Insufficient documentation

## 2019-05-03 DIAGNOSIS — Z01812 Encounter for preprocedural laboratory examination: Secondary | ICD-10-CM | POA: Insufficient documentation

## 2019-05-03 LAB — SARS CORONAVIRUS 2 (TAT 6-24 HRS): SARS Coronavirus 2: NEGATIVE

## 2019-05-03 MED ORDER — SPIRONOLACTONE 25 MG PO TABS: ORAL_TABLET | ORAL | 3 refills | Status: DC

## 2019-05-03 NOTE — Telephone Encounter (Signed)
Rx sent to preferred pharmacy.

## 2019-05-03 NOTE — Telephone Encounter (Signed)
Patient is calling about a new prescription he was to be given for Aldactone 25 mg.  He states he still has not gotten it. He would like this to go to [place], [place]

## 2019-05-04 ENCOUNTER — Other Ambulatory Visit: Payer: Self-pay

## 2019-05-04 ENCOUNTER — Encounter (HOSPITAL_COMMUNITY)
Admission: RE | Admit: 2019-05-04 | Discharge: 2019-05-04 | Disposition: A | Discharge status: Home / Self Care | Payer: Medicare Other | Source: Ambulatory Visit | Attending: Pulmonary Disease | Admitting: Pulmonary Disease

## 2019-05-04 DIAGNOSIS — R0602 Shortness of breath: Secondary | ICD-10-CM | POA: Diagnosis not present

## 2019-05-04 NOTE — Progress Notes (Signed)
Daily Session Note  Patient Details  Name: Rodney Burns MRN: [id number] Date of Birth: [birth year] Referring Provider:     Pulmonary Rehab Walk Test from 04/01/2019 in [place]  Referring Provider  Dr. [name]      Encounter Date: 05/04/2019  Check In: Session Check In - 05/04/19 1047      Check-In   Supervising physician immediately available to respond to emergencies  [facility] immediately available    Physician(s)  Dr. Doristine Bosworth    Location  [facility]    Staff Present  Rosebud Poles, RN, Bjorn Loser, MS, Exercise Physiologist;Amahri Dengel Ysidro Evert, RN    Virtual Visit  No    Medication changes reported      No    Fall or balance concerns reported     No    Tobacco Cessation  No Change    Warm-up and Cool-down  Performed as group-led instruction    Resistance Training Performed  Yes    VAD Patient?  No    PAD/SET Patient?  No      Pain Assessment   Currently in Pain?  No/denies    Multiple Pain Sites  No       Capillary Blood Glucose: Results for orders placed or performed during the hospital encounter of 05/03/19 (from the past 24 hour(s))  SARS CORONAVIRUS 2 (TAT 6-24 HRS) Nasopharyngeal Nasopharyngeal Swab     Status: None   Collection Time: 05/03/19  2:28 PM   Specimen: Nasopharyngeal Swab  Result Value Ref Range   SARS Coronavirus 2 NEGATIVE NEGATIVE      Social History   Tobacco Use  Smoking Status Former Smoker  . Packs/day: 2.00  . Years: 30.00  . Pack years: 60.00  . Types: Cigarettes  . Quit date: 01/22/1992  . Years since quitting: 27.2  Smokeless Tobacco Never Used    Goals Met:  Exercise tolerated well No report of cardiac concerns or symptoms Strength training completed today  Goals Unmet:  Not Applicable  Comments: Service time is from 1030 to 1130    Dr. Fransico Him is Medical Director for Cardiac Rehab at [facility].

## 2019-05-04 NOTE — Progress Notes (Signed)
Nutrition Note Pt with recent A1C of 6.7%. He had been on Prednisone which just ended 2 weeks ago. He reports A1C historically being in the pre-diabetes range.  During his assessment, he had reported following a low carb diet. He has acclimated to this way of eating and feels comfortable continuing. Reviewed importance of modifying carbs even in prediabetes range. However, pt's diet recall reveals much lower carb than recommended so no further changes needed.   Today we discussed carbohydrate counting, and eating a consistent amount of carbohydrates across the day. Reviewed the benefits of carbohydrate counting and eating a consistent amount of carbohydrates across the day. Showed pt how to calculate carbohydrate servings, and distributed handouts for patient to practice. Recommended pt eat 60-75 g  of carbohydrates at meals.   Pt verbalized understanding of material discussed today. Distributed RD contact information.     Michaele Offer, MS, RDN, LDN

## 2019-05-05 ENCOUNTER — Ambulatory Visit (HOSPITAL_BASED_OUTPATIENT_CLINIC_OR_DEPARTMENT_OTHER): Payer: Medicare Other | Attending: Pulmonary Disease | Admitting: Pulmonary Disease

## 2019-05-05 ENCOUNTER — Other Ambulatory Visit: Payer: Self-pay

## 2019-05-05 VITALS — Temp 96.2°F | Ht 70.0 in | Wt 212.0 lb

## 2019-05-05 DIAGNOSIS — G4733 Obstructive sleep apnea (adult) (pediatric): Secondary | ICD-10-CM | POA: Diagnosis not present

## 2019-05-05 DIAGNOSIS — Z79899 Other long term (current) drug therapy: Secondary | ICD-10-CM | POA: Diagnosis not present

## 2019-05-05 DIAGNOSIS — Z7901 Long term (current) use of anticoagulants: Secondary | ICD-10-CM | POA: Insufficient documentation

## 2019-05-06 ENCOUNTER — Other Ambulatory Visit (HOSPITAL_BASED_OUTPATIENT_CLINIC_OR_DEPARTMENT_OTHER): Payer: Self-pay

## 2019-05-06 ENCOUNTER — Encounter (HOSPITAL_COMMUNITY)
Admission: RE | Admit: 2019-05-06 | Discharge: 2019-05-06 | Disposition: A | Discharge status: Home / Self Care | Payer: Medicare Other | Source: Ambulatory Visit | Attending: Pulmonary Disease | Admitting: Pulmonary Disease

## 2019-05-06 DIAGNOSIS — R0602 Shortness of breath: Secondary | ICD-10-CM

## 2019-05-06 DIAGNOSIS — G4733 Obstructive sleep apnea (adult) (pediatric): Secondary | ICD-10-CM

## 2019-05-06 NOTE — Progress Notes (Signed)
Daily Session Note  Patient Details  Name: Rodney Burns MRN: [id number] Date of Birth: Mar 03, 1945 Referring Provider:     Pulmonary Rehab Walk Test from 04/01/2019 in [facility]  Referring Provider  Dr. [name]      Encounter Date: 05/06/2019  Check In: Session Check In - 05/06/19 1137      Check-In   Supervising physician immediately available to respond to emergencies  [facility] immediately available    Physician(s)  Dr. Erlinda Hong    Location  [facility]    Staff Present  Rosebud Poles, RN, Bjorn Loser, MS, Exercise Physiologist;Lisa Ysidro Evert, RN    Virtual Visit  No    Medication changes reported      No    Fall or balance concerns reported     No    Tobacco Cessation  No Change    Warm-up and Cool-down  Performed as group-led instruction    Resistance Training Performed  Yes    VAD Patient?  No    PAD/SET Patient?  No      Pain Assessment   Currently in Pain?  No/denies    Multiple Pain Sites  No       Capillary Blood Glucose: No results found for this or any previous visit (from the past 24 hour(s)).    Social History   Tobacco Use  Smoking Status Former Smoker  . Packs/day: 2.00  . Years: 30.00  . Pack years: 60.00  . Types: Cigarettes  . Quit date: 01/22/1992  . Years since quitting: 27.3  Smokeless Tobacco Never Used    Goals Met:  Proper associated with RPD/PD & O2 Sat Exercise tolerated well Strength training completed today  Goals Unmet:  Not Applicable  Comments: Service time is from 1037 to 1135    Dr. Fransico Him is Medical Director for Cardiac Rehab at [facility].

## 2019-05-11 ENCOUNTER — Other Ambulatory Visit: Payer: Self-pay

## 2019-05-11 ENCOUNTER — Encounter (HOSPITAL_COMMUNITY)
Admission: RE | Admit: 2019-05-11 | Discharge: 2019-05-11 | Disposition: A | Discharge status: Home / Self Care | Payer: Medicare Other | Source: Ambulatory Visit | Attending: Pulmonary Disease | Admitting: Pulmonary Disease

## 2019-05-11 VITALS — Temp 97.2°F | Wt 216.3 lb

## 2019-05-11 DIAGNOSIS — R0602 Shortness of breath: Secondary | ICD-10-CM

## 2019-05-11 NOTE — Progress Notes (Signed)
I have reviewed a Home Exercise Prescription with Rodney Burns . [name] is  currently exercising at home.  The patient was advised to walk 2 days a week for 30-45 minutes.  [name] and I discussed how to progress their exercise prescription.  The patient stated that their goals were to get back into his previous physical shape.  The patient stated that they understand the exercise prescription.  We reviewed exercise guidelines, target heart rate during exercise, RPE Scale, weather conditions, NTG use, endpoints for exercise, warmup and cool down.  Patient is encouraged to come to me with any questions. I will continue to follow up with the patient to assist them with progression and safety.

## 2019-05-11 NOTE — Progress Notes (Signed)
Daily Session Note  Patient Details  Name: Rodney Burns MRN: [id number] Date of Birth: [birth year] Referring Provider:     Pulmonary Rehab Walk Test from 04/01/2019 in [place]  Referring Provider  Dr. [name]      Encounter Date: 05/11/2019  Check In: Session Check In - 05/11/19 1053      Check-In   Supervising physician immediately available to respond to emergencies  [facility] immediately available    Physician(s)  Dr. Tawanna Solo    Location  [facility]    Staff Present  Rosebud Poles, RN, Bjorn Loser, MS, Exercise Physiologist;Lisa Ysidro Evert, RN    Virtual Visit  No    Medication changes reported      No    Fall or balance concerns reported     No    Tobacco Cessation  No Change    Warm-up and Cool-down  Performed as group-led instruction    Resistance Training Performed  Yes    VAD Patient?  No    PAD/SET Patient?  No      Pain Assessment   Currently in Pain?  No/denies    Multiple Pain Sites  No       Capillary Blood Glucose: No results found for this or any previous visit (from the past 24 hour(s)).  Exercise Prescription Changes - 05/11/19 1100      Response to Exercise   Blood Pressure (Admit)  124/70    Blood Pressure (Exercise)  126/80    Blood Pressure (Exit)  120/66    Heart Rate (Admit)  63 bpm    Heart Rate (Exercise)  110 bpm    Heart Rate (Exit)  73 bpm    Oxygen Saturation (Admit)  100 %    Oxygen Saturation (Exercise)  98 %    Oxygen Saturation (Exit)  99 %    Rating of Perceived Exertion (Exercise)  14    Perceived Dyspnea (Exercise)  2    Duration  Continue with 30 min of aerobic exercise without signs/symptoms of physical distress.    Intensity  THRR unchanged      Progression   Progression  Continue to progress workloads to maintain intensity without signs/symptoms of physical distress.      Resistance Training   Training Prescription  Yes    Weight  blue bands    Reps  10-15     Time  10 Minutes      Interval Training   Interval Training  No      Oxygen   Oxygen  Continuous    Liters  4      Treadmill   MPH  1.8    Grade  1    Minutes  15      NuStep   Level  4    SPM  80    Minutes  15    METs  2.3      Home Exercise Plan   Plans to continue exercise at  Home (comment)    Frequency  Add 2 additional days to program exercise sessions.    Initial Home Exercises Provided  05/11/19       Social History   Tobacco Use  Smoking Status Former Smoker  . Packs/day: 2.00  . Years: 30.00  . Pack years: 60.00  . Types: Cigarettes  . Quit date: 01/22/1992  . Years since quitting: 27.3  Smokeless Tobacco Never Used    Goals Met:  Proper associated  with RPD/PD & O2 Sat Exercise tolerated well Strength training completed today  Goals Unmet:  Not Applicable  Comments: Service time is from 1037 to 1135    Dr. Fransico Him is Medical Director for Cardiac Rehab at [facility].

## 2019-05-12 DIAGNOSIS — G4733 Obstructive sleep apnea (adult) (pediatric): Secondary | ICD-10-CM | POA: Diagnosis not present

## 2019-05-12 NOTE — Procedures (Signed)
    Patient Name: Rodney Burns, Rodney Burns Date: 05/05/2019 Gender: Male D.O.B: 04/17/1945 Age (years): 71 Referring Provider: Marshell Garfinkel Height (inches): 70 Interpreting Physician: Chesley Mires MD, ABSM Weight (lbs): 212 RPSGT: Zadie Rhine BMI: 30 MRN: [id number] Neck Size: 17.00  CLINICAL INFORMATION The patient is referred for a CPAP titration to treat sleep apnea.  Date of NPSG 03/30/19: AHI 22.8, SpO2 low 85%.  SLEEP STUDY TECHNIQUE As per the AASM Manual for the Scoring of Sleep and Associated Events v2.3 (April 2016) with a hypopnea requiring 4% desaturations.  The channels recorded and monitored were frontal, central and occipital EEG, electrooculogram (EOG), submentalis EMG (chin), nasal and oral airflow, thoracic and abdominal wall motion, anterior tibialis EMG, snore microphone, electrocardiogram, and pulse oximetry. Continuous positive airway pressure (CPAP) was initiated at the beginning of the study and titrated to treat sleep-disordered breathing.  MEDICATIONS Medications self-administered by patient taken the night of the study : entresto, VYTORIN, XALATAN, MAGNESIUM, toprol-xl, MIRAPEX, stool softner, xarelto, VITAMIN D  TECHNICIAN COMMENTS Comments added by technician: NO RESTROOM VISTED Comments added by scorer: N/A  RESPIRATORY PARAMETERS Optimal PAP Pressure (cm): 14 AHI at Optimal Pressure (/hr): 0.0 Overall Minimal O2 (%): 78.0 Supine % at Optimal Pressure (%): 5 Minimal O2 at Optimal Pressure (%): 78.0   SLEEP ARCHITECTURE The study was initiated at 10:19:02 PM and ended at 4:14:17 AM.  Sleep onset time was 7.0 minutes and the sleep efficiency was 93.6%%. The total sleep time was 332.5 minutes.  The patient spent 3.6%% of the night in stage N1 sleep, 79.1%% in stage N2 sleep, 0.9%% in stage N3 and 16.4% in REM.Stage REM latency was 30.0 minutes  Wake after sleep onset was 15.8. Alpha intrusion was absent. Supine sleep was 1.93%.  CARDIAC DATA The  2 lead EKG demonstrated sinus rhythm. The mean heart rate was 55.3 beats per minute. Other EKG findings include: None.  LEG MOVEMENT DATA The total Periodic Limb Movements of Sleep (PLMS) were 0. The PLMS index was 0.0. A PLMS index of <15 is considered normal in adults.  IMPRESSIONS - He did well with CPAP 14 cm H2O. - He did not require the use of supplemental oxygen during this study.  DIAGNOSIS - Obstructive Sleep Apnea (327.23 [G47.33 ICD-10])  RECOMMENDATIONS - Trial of CPAP therapy on 14 cm H2O with a Medium size Resmed Full Face Mask AirFit F20 mask and heated humidification.  [Electronically signed] 05/12/2019 08:35 AM  Chesley Mires MD, [place], American Board of Sleep Medicine   NPI: [id number]

## 2019-05-12 NOTE — Progress Notes (Signed)
Patient Name: Rodney Burns, Rodney Burns Date: 05/05/2019 Gender: Male D.O.B: 04/28/1945 Age (years): 52 Referring Provider: Marshell Garfinkel Height (inches): 70 Interpreting Physician: Chesley Mires MD, ABSM Weight (lbs): 212 RPSGT: Zadie Rhine BMI: 30 MRN: [id number] Neck Size: 17.00  CLINICAL INFORMATION The patient is referred for a CPAP titration to treat sleep apnea.  Date of NPSG 03/30/19: AHI 22.8, SpO2 low 85%.  SLEEP STUDY TECHNIQUE As per the AASM Manual for the Scoring of Sleep and Associated Events v2.3 (April 2016) with a hypopnea requiring 4% desaturations.  The channels recorded and monitored were frontal, central and occipital EEG, electrooculogram (EOG), submentalis EMG (chin), nasal and oral airflow, thoracic and abdominal wall motion, anterior tibialis EMG, snore microphone, electrocardiogram, and pulse oximetry. Continuous positive airway pressure (CPAP) was initiated at the beginning of the study and titrated to treat sleep-disordered breathing.  MEDICATIONS Medications self-administered by patient taken the night of the study : entresto, VYTORIN, XALATAN, MAGNESIUM, toprol-xl, MIRAPEX, stool softner, xarelto, VITAMIN D  TECHNICIAN COMMENTS Comments added by technician: NO RESTROOM VISTED Comments added by scorer: N/A  RESPIRATORY PARAMETERS Optimal PAP Pressure (cm): 14 AHI at Optimal Pressure (/hr): 0.0 Overall Minimal O2 (%): 78.0 Supine % at Optimal Pressure (%): 5 Minimal O2 at Optimal Pressure (%): 78.0   SLEEP ARCHITECTURE The study was initiated at 10:19:02 PM and ended at 4:14:17 AM.  Sleep onset time was 7.0 minutes and the sleep efficiency was 93.6%%. The total sleep time was 332.5 minutes.  The patient spent 3.6%% of the night in stage N1 sleep, 79.1%% in stage N2 sleep, 0.9%% in stage N3 and 16.4% in REM.Stage REM latency was 30.0 minutes  Wake after sleep onset was 15.8. Alpha intrusion was absent. Supine sleep was 1.93%.  CARDIAC DATA The 2 lead  EKG demonstrated sinus rhythm. The mean heart rate was 55.3 beats per minute. Other EKG findings include: None.  LEG MOVEMENT DATA The total Periodic Limb Movements of Sleep (PLMS) were 0. The PLMS index was 0.0. A PLMS index of <15 is considered normal in adults.  IMPRESSIONS - He did well with CPAP 14 cm H2O. - He did not require the use of supplemental oxygen during this study.  DIAGNOSIS - Obstructive Sleep Apnea (327.23 [G47.33 ICD-10])  RECOMMENDATIONS - Trial of CPAP therapy on 14 cm H2O with a Medium size Resmed Full Face Mask AirFit F20 mask and heated humidification.  [Electronically signed] 05/12/2019 08:35 AM  Chesley Mires MD, [place], [employer]   NPI: [id number]

## 2019-05-12 NOTE — Progress Notes (Signed)
Pulmonary Individual Treatment Plan  Patient Details  Name: Rodney Burns MRN: [id number] Date of Birth: 07-29-1945 Referring Provider:     Pulmonary Rehab Walk Test from 04/01/2019 in [facility]  Referring Provider  Dr. [name]      Initial Encounter Date:    Pulmonary Rehab Walk Test from 04/01/2019 in [facility]  Date  04/01/19      Visit Diagnosis: Shortness of breath  Patient's Home Medications on Admission:   Current Outpatient Medications:  .  albuterol (VENTOLIN HFA) 108 (90 Base) MCG/ACT inhaler, Inhale 1 puff into the lungs every 6 (six) hours as needed for wheezing or shortness of breath., Disp: , Rfl:  .  digoxin (LANOXIN) 0.125 MG tablet, Take 1 tablet (0.125 mg total) by mouth daily., Disp: 90 tablet, Rfl: 3 .  Docusate Calcium (STOOL SOFTENER PO), Take by mouth 2 (two) times daily., Disp: , Rfl:  .  ezetimibe-simvastatin (VYTORIN) 10-20 MG tablet, TAKE 1 TABLET AT BEDTIME (KEEP UPCOMING APPOINTMENT FOR REFILLS), Disp: 90 tablet, Rfl: 3 .  Fiber, Guar Gum, CHEW, Chew 3 capsules by mouth daily. , Disp: , Rfl:  .  latanoprost (XALATAN) 0.005 % ophthalmic solution, Place 1 drop into both eyes at bedtime. , Disp: , Rfl:  .  Magnesium 250 MG TABS, Take 500 mg by mouth at bedtime., Disp: , Rfl:  .  metoprolol succinate (TOPROL-XL) 50 MG 24 hr tablet, Take 1 tablet (50 mg) in the mornings and half tablet (25 mg) in the evenings., Disp: 135 tablet, Rfl: 3 .  pramipexole (MIRAPEX) 0.5 MG tablet, Take 1.5 mg by mouth daily. , Disp: , Rfl:  .  predniSONE (DELTASONE) 5 MG tablet, Take 31m daily, Disp: 180 tablet, Rfl: 1 .  RABEprazole (ACIPHEX) 20 MG tablet, Take 1 tablet (20 mg total) by mouth daily., Disp: 90 tablet, Rfl: 3 .  sacubitril-valsartan (ENTRESTO) 49-51 MG, Take 1 tablet by mouth 2 (two) times daily., Disp: 180 tablet, Rfl: 3 .  spironolactone (ALDACTONE) 25 MG tablet, Take 1/2 tablet ( 12.5 mg ) daily, Disp:  45 tablet, Rfl: 3 .  traMADol (ULTRAM) 50 MG tablet, TAKE 1 TABLET EVERY 6 HOURS AS NEEDED FOR MODERATE PAIN, Disp: 120 tablet, Rfl: 0 .  umeclidinium-vilanterol (ANORO ELLIPTA) 62.5-25 MCG/INH AEPB, Inhale 1 puff into the lungs daily., Disp: 180 each, Rfl: 3 .  XARELTO 20 MG TABS tablet, TAKE 1 TABLET DAILY WITH SUPPER, Disp: 90 tablet, Rfl: 3  Past Medical History: Past Medical History:  Diagnosis Date  . Atrial tachycardia [place]    a. s/p DCCV 09/2009; b. s/p RFCA 03/2011; c. s/p DCCV 04/2011; d. s/p RFCA 09/17/11.  . Cataract   . Chronic systolic CHF (congestive heart failure) ([facility]    a. 01/2015 Echo: EF 40-45%, antsept/infsept HK, triv AI, mild MR, mod dil LA, nl RV, mildly dil RA.  .Marland KitchenCLL (chronic lymphoblastic leukemia)    Stage 0-1  . Coronary artery disease involving native coronary artery without angina pectoris    a. Status post CABG in 1994:By Dr. BCyndia Bent LIMA - L Cx, seqSVG-Diag-LAD, and SVG-PDA-PL.  .Marland KitchenCough    Consistant with ACE inhibitor mediated cough  . Dysrhythmia   . Glaucoma (increased eye pressure) 1991  . History of tobacco abuse    quit 1994  . Hypercholesterolemia    Well Controlled  . Hypertension   . Ischemic cardiomyopathy    a. 02/2011 Echo: EF 40-45%;  b. 01/2015 Echo:  EF 40-45%.  . Malaria 1972   Hx of  . Pericarditis    a. 01/2015-->Treated w/ ibuprofen/colchicine.  . Sleep-disordered breathing 06/20/2011    Tobacco Use: Social History   Tobacco Use  Smoking Status Former Smoker  . Packs/day: 2.00  . Years: 30.00  . Pack years: 60.00  . Types: Cigarettes  . Quit date: 01/22/1992  . Years since quitting: 27.3  Smokeless Tobacco Never Used    Labs: Recent Chemical engineer    Labs for ITP Cardiac and Pulmonary Rehab Latest Ref Rng & Units 02/21/2017 10/16/2017 12/24/2018   Cholestrol 100 - 199 mg/dL 142 108 -   LDLCALC 0 - 99 mg/dL - 55 -   HDL >39 mg/dL - 42 -   Trlycerides <150 mg/dL - 56 93   Hemoglobin A1c 4.8 - 5.6 % - - 6.7(H)       Capillary Blood Glucose: Lab Results  Component Value Date   GLUCAP 114 (H) 12/30/2018   GLUCAP 154 (H) 12/30/2018   GLUCAP 129 (H) 12/30/2018   GLUCAP 73 12/30/2018   GLUCAP 170 (H) 12/29/2018     Pulmonary Assessment Scores: Pulmonary Assessment Scores    Row Name 04/01/19 1508 04/01/19 1549       ADL UCSD   ADL Phase  Entry  Entry    SOB Score total  32  --      CAT Score   CAT Score  26  --      mMRC Score   mMRC Score  --  2      UCSD: Self-administered rating of dyspnea associated with activities of daily living (ADLs) 6-point scale (0 = "not at all" to 5 = "maximal or unable to do because of breathlessness")  Scoring Scores range from 0 to 120.  Minimally important difference is 5 units  CAT: CAT can identify the health impairment of COPD patients and is better correlated with disease progression.  CAT has a scoring range of zero to 40. The CAT score is classified into four groups of low (less than 10), medium (10 - 20), high (21-30) and very high (31-40) based on the impact level of disease on health status. A CAT score over 10 suggests significant symptoms.  A worsening CAT score could be explained by an exacerbation, poor medication adherence, poor inhaler technique, or progression of COPD or comorbid conditions.  CAT MCID is 2 points  mMRC: mMRC (Modified Medical Research Council) Dyspnea Scale is used to assess the degree of baseline functional disability in patients of respiratory disease due to dyspnea. No minimal important difference is established. A decrease in score of 1 point or greater is considered a positive change.   Pulmonary Function Assessment: Pulmonary Function Assessment - 04/01/19 1452      Breath   Bilateral Breath Sounds  Clear    Shortness of Breath  Yes;Limiting activity       Exercise Target Goals: Exercise Program Goal: Individual exercise prescription set using results from initial 6 min walk test and THRR while  considering  patient's activity barriers and safety.   Exercise Prescription Goal: Initial exercise prescription builds to 30-45 minutes a day of aerobic activity, 2-3 days per week.  Home exercise guidelines will be given to patient during program as part of exercise prescription that the participant will acknowledge.  Activity Barriers & Risk Stratification: Activity Barriers & Cardiac Risk Stratification - 04/01/19 1448      Activity Barriers & Cardiac Risk Stratification  Activity Barriers  Muscular Weakness;Shortness of Breath;Deconditioning       6 Minute Walk: 6 Minute Walk    Row Name 04/01/19 1549         6 Minute Walk   Phase  Initial     Distance  1332 feet     Walk Time  6 minutes     # of Rest Breaks  0     MPH  2.52     METS  2.79     RPE  13     Perceived Dyspnea   2     VO2 Peak  9.76     Symptoms  No     Resting HR  74 bpm     Resting BP  124/64     Resting Oxygen Saturation   97 %     Exercise Oxygen Saturation  during 6 min walk  92 %     Max Ex. HR  114 bpm     Max Ex. BP  144/66     2 Minute Post BP  130/68       Interval HR   1 Minute HR  112     2 Minute HR  114     3 Minute HR  113     4 Minute HR  111     5 Minute HR  111     6 Minute HR  112     2 Minute Post HR  76     Interval Heart Rate?  Yes       Interval Oxygen   Interval Oxygen?  Yes     Baseline Oxygen Saturation %  97 %     1 Minute Oxygen Saturation %  95 %     1 Minute Liters of Oxygen  4 L     2 Minute Oxygen Saturation %  93 %     2 Minute Liters of Oxygen  4 L     3 Minute Oxygen Saturation %  93 %     3 Minute Liters of Oxygen  4 L     4 Minute Oxygen Saturation %  93 %     4 Minute Liters of Oxygen  4 L     5 Minute Oxygen Saturation %  92 %     5 Minute Liters of Oxygen  4 L     6 Minute Oxygen Saturation %  92 %     6 Minute Liters of Oxygen  4 L     2 Minute Post Oxygen Saturation %  97 %     2 Minute Post Liters of Oxygen  2 L        Oxygen Initial  Assessment: Oxygen Initial Assessment - 04/01/19 1548      Home Oxygen   Home Oxygen Device  Home Concentrator;E-Tanks    Sleep Oxygen Prescription  Continuous    Liters per minute  2    Home Exercise Oxygen Prescription  Continuous    Liters per minute  4    Home at Rest Exercise Oxygen Prescription  None    Compliance with Home Oxygen Use  Yes      Initial 6 min Walk   Oxygen Used  Continuous    Liters per minute  4      Program Oxygen Prescription   Program Oxygen Prescription  Continuous    Liters per minute  4      Intervention  Short Term Goals  To learn and exhibit compliance with exercise, home and travel O2 prescription;To learn and understand importance of monitoring SPO2 with pulse oximeter and demonstrate accurate use of the pulse oximeter.;To learn and understand importance of maintaining oxygen saturations>88%;To learn and demonstrate proper pursed lip breathing techniques or other breathing techniques.;To learn and demonstrate proper use of respiratory medications    Long  Term Goals  Exhibits compliance with exercise, home and travel O2 prescription;Verbalizes importance of monitoring SPO2 with pulse oximeter and return demonstration;Maintenance of O2 saturations>88%;Exhibits proper breathing techniques, such as pursed lip breathing or other method taught during program session;Compliance with respiratory medication;Demonstrates proper use of MDI's       Oxygen Re-Evaluation: Oxygen Re-Evaluation    Row Name 04/13/19 0711 05/11/19 0747           Program Oxygen Prescription   Program Oxygen Prescription  Continuous  Continuous      Liters per minute  --  4        Home Oxygen   Home Oxygen Device  Home Concentrator;E-Tanks  Home Concentrator;E-Tanks      Sleep Oxygen Prescription  Continuous  Continuous      Liters per minute  2  2      Home Exercise Oxygen Prescription  Continuous  Continuous      Liters per minute  4  4      Home at Rest Exercise Oxygen  Prescription  None  None      Compliance with Home Oxygen Use  --  Yes        Goals/Expected Outcomes   Short Term Goals  To learn and exhibit compliance with exercise, home and travel O2 prescription;To learn and understand importance of monitoring SPO2 with pulse oximeter and demonstrate accurate use of the pulse oximeter.;To learn and understand importance of maintaining oxygen saturations>88%;To learn and demonstrate proper pursed lip breathing techniques or other breathing techniques.;To learn and demonstrate proper use of respiratory medications  To learn and exhibit compliance with exercise, home and travel O2 prescription;To learn and understand importance of monitoring SPO2 with pulse oximeter and demonstrate accurate use of the pulse oximeter.;To learn and understand importance of maintaining oxygen saturations>88%;To learn and demonstrate proper pursed lip breathing techniques or other breathing techniques.;To learn and demonstrate proper use of respiratory medications      Long  Term Goals  Exhibits compliance with exercise, home and travel O2 prescription;Verbalizes importance of monitoring SPO2 with pulse oximeter and return demonstration;Maintenance of O2 saturations>88%;Exhibits proper breathing techniques, such as pursed lip breathing or other method taught during program session;Compliance with respiratory medication;Demonstrates proper use of MDI's  Exhibits compliance with exercise, home and travel O2 prescription;Verbalizes importance of monitoring SPO2 with pulse oximeter and return demonstration;Maintenance of O2 saturations>88%;Exhibits proper breathing techniques, such as pursed lip breathing or other method taught during program session;Compliance with respiratory medication;Demonstrates proper use of MDI's      Goals/Expected Outcomes  compliance  compliance         Oxygen Discharge (Final Oxygen Re-Evaluation): Oxygen Re-Evaluation - 05/11/19 0747      Program Oxygen  Prescription   Program Oxygen Prescription  Continuous    Liters per minute  4      Home Oxygen   Home Oxygen Device  Home Concentrator;E-Tanks    Sleep Oxygen Prescription  Continuous    Liters per minute  2    Home Exercise Oxygen Prescription  Continuous    Liters per minute  4  Home at Rest Exercise Oxygen Prescription  None    Compliance with Home Oxygen Use  Yes      Goals/Expected Outcomes   Short Term Goals  To learn and exhibit compliance with exercise, home and travel O2 prescription;To learn and understand importance of monitoring SPO2 with pulse oximeter and demonstrate accurate use of the pulse oximeter.;To learn and understand importance of maintaining oxygen saturations>88%;To learn and demonstrate proper pursed lip breathing techniques or other breathing techniques.;To learn and demonstrate proper use of respiratory medications    Long  Term Goals  Exhibits compliance with exercise, home and travel O2 prescription;Verbalizes importance of monitoring SPO2 with pulse oximeter and return demonstration;Maintenance of O2 saturations>88%;Exhibits proper breathing techniques, such as pursed lip breathing or other method taught during program session;Compliance with respiratory medication;Demonstrates proper use of MDI's    Goals/Expected Outcomes  compliance       Initial Exercise Prescription: Initial Exercise Prescription - 04/01/19 1500      Date of Initial Exercise RX and Referring Provider   Date  04/01/19    Referring Provider  Dr. [name]      Oxygen   Oxygen  Continuous    Liters  4      Treadmill   MPH  1.6    Grade  0    Minutes  15      NuStep   Level  2    SPM  80    Minutes  15      Prescription Details   Frequency (times per week)  2    Duration  Progress to 30 minutes of continuous aerobic without signs/symptoms of physical distress      Intensity   THRR 40-80% of Max Heartrate  58-117    Ratings of Perceived Exertion  11-13    Perceived  Dyspnea  0-4      Progression   Progression  Continue progressive overload as per policy without signs/symptoms or physical distress.      Resistance Training   Training Prescription  Yes    Weight  blue bands    Reps  10-15       Perform Capillary Blood Glucose checks as needed.  Exercise Prescription Changes: Exercise Prescription Changes    Row Name 04/13/19 1200 04/22/19 1137 05/11/19 1100         Response to Exercise   Blood Pressure (Admit)  104/64  110/64  124/70     Blood Pressure (Exercise)  130/54  --  126/80     Blood Pressure (Exit)  114/64  112/60  120/66     Heart Rate (Admit)  74 bpm  69 bpm  63 bpm     Heart Rate (Exercise)  109 bpm  83 bpm  110 bpm     Heart Rate (Exit)  74 bpm  75 bpm  73 bpm     Oxygen Saturation (Admit)  97 %  100 %  100 %     Oxygen Saturation (Exercise)  97 %  98 %  98 %     Oxygen Saturation (Exit)  98 %  99 %  99 %     Rating of Perceived Exertion (Exercise)  '13  12  14     ' Perceived Dyspnea (Exercise)  '2  2  2     ' Duration  Continue with 30 min of aerobic exercise without signs/symptoms of physical distress.  Continue with 30 min of aerobic exercise without signs/symptoms of physical distress.  Continue with  30 min of aerobic exercise without signs/symptoms of physical distress.     Intensity  -- 40-80% HRR  THRR unchanged  THRR unchanged       Progression   Progression  Continue to progress workloads to maintain intensity without signs/symptoms of physical distress.  Continue to progress workloads to maintain intensity without signs/symptoms of physical distress.  Continue to progress workloads to maintain intensity without signs/symptoms of physical distress.       Resistance Training   Training Prescription  Yes  Yes  Yes     Weight  blue bands  blue bands  blue bands     Reps  10-15  10-15  10-15     Time  10 Minutes  10 Minutes  10 Minutes       Interval Training   Interval Training  No  No  No       Oxygen   Oxygen   Continuous  --  Continuous     Liters  4  --  4       Treadmill   MPH  1.6  1.8  1.8     Grade  '1  1  1     ' Minutes  '15  15  15       ' NuStep   Level  '3  4  4     ' SPM  80  80  80     Minutes  '15  15  15     ' METs  2.3  2.5  2.3       Home Exercise Plan   Plans to continue exercise at  --  --  Home (comment)     Frequency  --  --  Add 2 additional days to program exercise sessions.     Initial Home Exercises Provided  --  --  05/11/19        Exercise Comments: Exercise Comments    Row Name 05/11/19 1148           Exercise Comments  home exercise complete          Exercise Goals and Review: Exercise Goals    Row Name 04/01/19 1556 04/13/19 0712 05/11/19 0747         Exercise Goals   Increase Physical Activity  Yes  Yes  Yes     Intervention  Provide advice, education, support and counseling about physical activity/exercise needs.;Develop an individualized exercise prescription for aerobic and resistive training based on initial evaluation findings, risk stratification, comorbidities and participant's personal goals.  Provide advice, education, support and counseling about physical activity/exercise needs.;Develop an individualized exercise prescription for aerobic and resistive training based on initial evaluation findings, risk stratification, comorbidities and participant's personal goals.  Provide advice, education, support and counseling about physical activity/exercise needs.;Develop an individualized exercise prescription for aerobic and resistive training based on initial evaluation findings, risk stratification, comorbidities and participant's personal goals.     Expected Outcomes  Short Term: Attend rehab on a regular basis to increase amount of physical activity.;Long Term: Add in home exercise to make exercise part of routine and to increase amount of physical activity.;Long Term: Exercising regularly at least 3-5 days a week.  Short Term: Attend rehab on a regular  basis to increase amount of physical activity.;Long Term: Add in home exercise to make exercise part of routine and to increase amount of physical activity.;Long Term: Exercising regularly at least 3-5 days a week.  Short Term: Attend rehab on a regular  basis to increase amount of physical activity.;Long Term: Add in home exercise to make exercise part of routine and to increase amount of physical activity.;Long Term: Exercising regularly at least 3-5 days a week.     Increase Strength and Stamina  Yes  Yes  Yes     Intervention  Provide advice, education, support and counseling about physical activity/exercise needs.;Develop an individualized exercise prescription for aerobic and resistive training based on initial evaluation findings, risk stratification, comorbidities and participant's personal goals.  Provide advice, education, support and counseling about physical activity/exercise needs.;Develop an individualized exercise prescription for aerobic and resistive training based on initial evaluation findings, risk stratification, comorbidities and participant's personal goals.  Provide advice, education, support and counseling about physical activity/exercise needs.;Develop an individualized exercise prescription for aerobic and resistive training based on initial evaluation findings, risk stratification, comorbidities and participant's personal goals.     Expected Outcomes  Short Term: Increase workloads from initial exercise prescription for resistance, speed, and METs.;Short Term: Perform resistance training exercises routinely during rehab and add in resistance training at home;Long Term: Improve cardiorespiratory fitness, muscular endurance and strength as measured by increased METs and functional capacity (6MWT)  Short Term: Increase workloads from initial exercise prescription for resistance, speed, and METs.;Short Term: Perform resistance training exercises routinely during rehab and add in resistance  training at home;Long Term: Improve cardiorespiratory fitness, muscular endurance and strength as measured by increased METs and functional capacity (6MWT)  Short Term: Increase workloads from initial exercise prescription for resistance, speed, and METs.;Short Term: Perform resistance training exercises routinely during rehab and add in resistance training at home;Long Term: Improve cardiorespiratory fitness, muscular endurance and strength as measured by increased METs and functional capacity (6MWT)     Able to understand and use rate of perceived exertion (RPE) scale  Yes  Yes  Yes     Intervention  Provide education and explanation on how to use RPE scale  Provide education and explanation on how to use RPE scale  Provide education and explanation on how to use RPE scale     Expected Outcomes  Short Term: Able to use RPE daily in rehab to express subjective intensity level;Long Term:  Able to use RPE to guide intensity level when exercising independently  Short Term: Able to use RPE daily in rehab to express subjective intensity level;Long Term:  Able to use RPE to guide intensity level when exercising independently  Short Term: Able to use RPE daily in rehab to express subjective intensity level;Long Term:  Able to use RPE to guide intensity level when exercising independently     Able to understand and use Dyspnea scale  Yes  Yes  Yes     Intervention  Provide education and explanation on how to use Dyspnea scale  Provide education and explanation on how to use Dyspnea scale  Provide education and explanation on how to use Dyspnea scale     Expected Outcomes  Short Term: Able to use Dyspnea scale daily in rehab to express subjective sense of shortness of breath during exertion;Long Term: Able to use Dyspnea scale to guide intensity level when exercising independently  Short Term: Able to use Dyspnea scale daily in rehab to express subjective sense of shortness of breath during exertion;Long Term: Able to  use Dyspnea scale to guide intensity level when exercising independently  Short Term: Able to use Dyspnea scale daily in rehab to express subjective sense of shortness of breath during exertion;Long Term: Able to use Dyspnea scale  to guide intensity level when exercising independently     Knowledge and understanding of Target Heart Rate Range (THRR)  Yes  Yes  Yes     Intervention  Provide education and explanation of THRR including how the numbers were predicted and where they are located for reference  Provide education and explanation of THRR including how the numbers were predicted and where they are located for reference  Provide education and explanation of THRR including how the numbers were predicted and where they are located for reference     Expected Outcomes  Short Term: Able to state/look up THRR;Short Term: Able to use daily as guideline for intensity in rehab;Long Term: Able to use THRR to govern intensity when exercising independently  Short Term: Able to state/look up THRR;Short Term: Able to use daily as guideline for intensity in rehab;Long Term: Able to use THRR to govern intensity when exercising independently  Short Term: Able to state/look up THRR;Short Term: Able to use daily as guideline for intensity in rehab;Long Term: Able to use THRR to govern intensity when exercising independently     Understanding of Exercise Prescription  Yes  Yes  Yes     Intervention  Provide education, explanation, and written materials on patient's individual exercise prescription  Provide education, explanation, and written materials on patient's individual exercise prescription  Provide education, explanation, and written materials on patient's individual exercise prescription     Expected Outcomes  Short Term: Able to explain program exercise prescription  Short Term: Able to explain program exercise prescription  Short Term: Able to explain program exercise prescription        Exercise Goals  Re-Evaluation : Exercise Goals Re-Evaluation    Row Name 04/13/19 0093 04/13/19 1146 05/11/19 0747         Exercise Goal Re-Evaluation   Exercise Goals Review  Increase Physical Activity;Increase Strength and Stamina;Able to understand and use rate of perceived exertion (RPE) scale;Able to understand and use Dyspnea scale;Knowledge and understanding of Target Heart Rate Range (THRR);Understanding of Exercise Prescription  --  Increase Physical Activity;Increase Strength and Stamina;Able to understand and use rate of perceived exertion (RPE) scale;Able to understand and use Dyspnea scale;Knowledge and understanding of Target Heart Rate Range (THRR);Understanding of Exercise Prescription     Comments  Pt has attended 2 exercise sessions. Pt is motivated and I expect him to progress well. He currently exercises at 2.2 METs on the stepper. Will continue to monitor and progress as able.  --  Pt has attended 8 exercise sessions. He continues to stay motivated and is progressing well. He is currently exercising at 2.9 METs on the stepper. Will continue to monitor and progress as able.     Expected Outcomes  Through exercise at rehab and at home, the patient will decrease shortness of breath with daily activities and feel confident in carrying out an exercise regime at home.  Through exercise at rehab and at home, the patient will decrease shortness of breath with daily activities and feel confident in carrying out an exercise regime at home.  Through exercise at rehab and at home, the patient will decrease shortness of breath with daily activities and feel confident in carrying out an exercise regime at home.        Discharge Exercise Prescription (Final Exercise Prescription Changes): Exercise Prescription Changes - 05/11/19 1100      Response to Exercise   Blood Pressure (Admit)  124/70    Blood Pressure (Exercise)  126/80  Blood Pressure (Exit)  120/66    Heart Rate (Admit)  63 bpm    Heart Rate  (Exercise)  110 bpm    Heart Rate (Exit)  73 bpm    Oxygen Saturation (Admit)  100 %    Oxygen Saturation (Exercise)  98 %    Oxygen Saturation (Exit)  99 %    Rating of Perceived Exertion (Exercise)  14    Perceived Dyspnea (Exercise)  2    Duration  Continue with 30 min of aerobic exercise without signs/symptoms of physical distress.    Intensity  THRR unchanged      Progression   Progression  Continue to progress workloads to maintain intensity without signs/symptoms of physical distress.      Resistance Training   Training Prescription  Yes    Weight  blue bands    Reps  10-15    Time  10 Minutes      Interval Training   Interval Training  No      Oxygen   Oxygen  Continuous    Liters  4      Treadmill   MPH  1.8    Grade  1    Minutes  15      NuStep   Level  4    SPM  80    Minutes  15    METs  2.3      Home Exercise Plan   Plans to continue exercise at  Home (comment)    Frequency  Add 2 additional days to program exercise sessions.    Initial Home Exercises Provided  05/11/19       Nutrition:  Target Goals: Understanding of nutrition guidelines, daily intake of sodium <1567m, cholesterol <2039m calories 30% from fat and 7% or less from saturated fats, daily to have 5 or more servings of fruits and vegetables.  Biometrics: Pre Biometrics - 04/01/19 1449      Pre Biometrics   Height  '5\' 10"'  (1.778 m)    Weight  218 lb 11.1 oz (99.2 kg)    BMI (Calculated)  31.38    Grip Strength  40 kg        Nutrition Therapy Plan and Nutrition Goals: Nutrition Therapy & Goals - 04/12/19 1023      Nutrition Therapy   Diet  Carb modified      Personal Nutrition Goals   Nutrition Goal  Pt to build a healthy plate including vegetables, fruits, whole grains, and low-fat dairy products in a heart healthy meal plan.    Personal Goal #2  Pt to limit carbohydrates to <75 g per meal and <30 g per snack    Personal Goal #3  Pt to increase fiber intake to 28-40 g/day       Intervention Plan   Intervention  Prescribe, educate and counsel regarding individualized specific dietary modifications aiming towards targeted core components such as weight, hypertension, lipid management, diabetes, heart failure and other comorbidities.;Nutrition handout(s) given to patient.    Expected Outcomes  Short Term Goal: A plan has been developed with personal nutrition goals set during dietitian appointment.;Long Term Goal: Adherence to prescribed nutrition plan.       Nutrition Assessments: Nutrition Assessments - 04/22/19 1122      Rate Your Plate Scores   Pre Score  32       Nutrition Goals Re-Evaluation: Nutrition Goals Re-Evaluation    Row Name 04/12/19 1023 05/11/19 0945  Goals   Current Weight  218 lb (98.9 kg)  212 lb (96.2 kg)      Nutrition Goal  --  Pt to build a healthy plate including vegetables, fruits, whole grains, and low-fat dairy products in a heart healthy meal plan.        Personal Goal #2 Re-Evaluation   Personal Goal #2  --  Pt to limit carbohydrates to <75 g per meal and <30 g per snack        Personal Goal #3 Re-Evaluation   Personal Goal #3  --  Pt to increase fiber intake to 28-40 g/day         Nutrition Goals Discharge (Final Nutrition Goals Re-Evaluation): Nutrition Goals Re-Evaluation - 05/11/19 0945      Goals   Current Weight  212 lb (96.2 kg)    Nutrition Goal  Pt to build a healthy plate including vegetables, fruits, whole grains, and low-fat dairy products in a heart healthy meal plan.      Personal Goal #2 Re-Evaluation   Personal Goal #2  Pt to limit carbohydrates to <75 g per meal and <30 g per snack      Personal Goal #3 Re-Evaluation   Personal Goal #3  Pt to increase fiber intake to 28-40 g/day       Psychosocial: Target Goals: Acknowledge presence or absence of significant depression and/or stress, maximize coping skills, provide positive support system. Participant is able to verbalize types and  ability to use techniques and skills needed for reducing stress and depression.  Initial Review & Psychosocial Screening: Initial Psych Review & Screening - 04/01/19 1510      Initial Review   Current issues with  None Identified      Family Dynamics   Good Support System?  Yes      Barriers   Psychosocial barriers to participate in program  There are no identifiable barriers or psychosocial needs.      Screening Interventions   Interventions  Encouraged to exercise       Quality of Life Scores:  Scores of 19 and below usually indicate a poorer quality of life in these areas.  A difference of  2-3 points is a clinically meaningful difference.  A difference of 2-3 points in the total score of the Quality of Life Index has been associated with significant improvement in overall quality of life, self-image, physical symptoms, and general health in studies assessing change in quality of life.  PHQ-9: Recent Review Flowsheet Data    Depression screen [facility] 2/9 04/01/2019   Decreased Interest 0   Down, Depressed, Hopeless 0   PHQ - 2 Score 0   Altered sleeping 0   Tired, decreased energy 0   Change in appetite 0   Feeling bad or failure about yourself  0   Trouble concentrating 0   Moving slowly or fidgety/restless 0   Suicidal thoughts 0   Difficult doing work/chores Not difficult at all     Interpretation of Total Score  Total Score Depression Severity:  1-4 = Minimal depression, 5-9 = Mild depression, 10-14 = Moderate depression, 15-19 = Moderately severe depression, 20-27 = Severe depression   Psychosocial Evaluation and Intervention: Psychosocial Evaluation - 04/01/19 1510      Psychosocial Evaluation & Interventions   Interventions  Encouraged to exercise with the program and follow exercise prescription    Continue Psychosocial Services   No Follow up required       Psychosocial Re-Evaluation: Psychosocial  Re-Evaluation    Row Name 04/12/19 1313 05/10/19 1313            Psychosocial Re-Evaluation   Current issues with  None Identified  None Identified      Comments  No psychosocial concerns identifed at this time.  No psychosocial concerns identified at this time.      Expected Outcomes  That patient will continue to have no barriers or psychosocial concerns while participating in pulmonary rehab.  For Matai to continue to have no barriers or psychsocial concerns while participating in pulmonary rehab.      Interventions  --  Encouraged to attend Pulmonary Rehabilitation for the exercise      Continue Psychosocial Services   --  No Follow up required         Psychosocial Discharge (Final Psychosocial Re-Evaluation): Psychosocial Re-Evaluation - 05/10/19 1313      Psychosocial Re-Evaluation   Current issues with  None Identified    Comments  No psychosocial concerns identified at this time.    Expected Outcomes  For Monica to continue to have no barriers or psychsocial concerns while participating in pulmonary rehab.    Interventions  Encouraged to attend Pulmonary Rehabilitation for the exercise    Continue Psychosocial Services   No Follow up required       Education: Education Goals: Education classes will be provided on a weekly basis, covering required topics. Participant will state understanding/return demonstration of topics presented.  Learning Barriers/Preferences: Learning Barriers/Preferences - 04/01/19 1511      Learning Barriers/Preferences   Learning Barriers  None    Learning Preferences  Audio;Computer/Internet;Group Instruction;Individual Instruction;Pictoral;Skilled Demonstration;Verbal Instruction;Video;Written Material       Education Topics: Risk Factor Reduction:  -Group instruction that is supported by a PowerPoint presentation. Instructor discusses the definition of a risk factor, different risk factors for pulmonary disease, and how the heart and lungs work together.     Nutrition for Pulmonary Patient:  -Group  instruction provided by PowerPoint slides, verbal discussion, and written materials to support subject matter. The instructor gives an explanation and review of healthy diet recommendations, which includes a discussion on weight management, recommendations for fruit and vegetable consumption, as well as protein, fluid, caffeine, fiber, sodium, sugar, and alcohol. Tips for eating when patients are short of breath are discussed.   Pursed Lip Breathing:  -Group instruction that is supported by demonstration and informational handouts. Instructor discusses the benefits of pursed lip and diaphragmatic breathing and detailed demonstration on how to preform both.     Oxygen Safety:  -Group instruction provided by PowerPoint, verbal discussion, and written material to support subject matter. There is an overview of "What is Oxygen" and "Why do we need it".  Instructor also reviews how to create a safe environment for oxygen use, the importance of using oxygen as prescribed, and the risks of noncompliance. There is a brief discussion on traveling with oxygen and resources the patient may utilize.   Oxygen Equipment:  -Group instruction provided by [facility] utilizing handouts, written materials, and equipment demonstrations.   Signs and Symptoms:  -Group instruction provided by written material and verbal discussion to support subject matter. Warning signs and symptoms of infection, stroke, and heart attack are reviewed and when to call the physician/911 reinforced. Tips for preventing the spread of infection discussed.   Advanced Directives:  -Group instruction provided by verbal instruction and written material to support subject matter. Instructor reviews Advanced Directive laws and proper instruction for filling  out document.   Pulmonary Video:  -Group video education that reviews the importance of medication and oxygen compliance, exercise, good nutrition, pulmonary hygiene, and pursed  lip and diaphragmatic breathing for the pulmonary patient.   Exercise for the Pulmonary Patient:  -Group instruction that is supported by a PowerPoint presentation. Instructor discusses benefits of exercise, core components of exercise, frequency, duration, and intensity of an exercise routine, importance of utilizing pulse oximetry during exercise, safety while exercising, and options of places to exercise outside of rehab.     Pulmonary Medications:  -Verbally interactive group education provided by instructor with focus on inhaled medications and proper administration.   Anatomy and Physiology of the Respiratory System and Intimacy:  -Group instruction provided by PowerPoint, verbal discussion, and written material to support subject matter. Instructor reviews respiratory cycle and anatomical components of the respiratory system and their functions. Instructor also reviews differences in obstructive and restrictive respiratory diseases with examples of each. Intimacy, Sex, and Sexuality differences are reviewed with a discussion on how relationships can change when diagnosed with pulmonary disease. Common sexual concerns are reviewed.   MD DAY -A group question and answer session with a medical doctor that allows participants to ask questions that relate to their pulmonary disease state.   OTHER EDUCATION -Group or individual verbal, written, or video instructions that support the educational goals of the pulmonary rehab program.   PULMONARY REHAB OTHER RESPIRATORY from 05/06/2019 in [place]  Date  05/06/19  Educator  -- Juanita Craver Level H/O]  Instruction Review Code  -- [na]      Holiday Eating Survival Tips:  -Group instruction provided by PowerPoint slides, verbal discussion, and written materials to support subject matter. The instructor gives patients tips, tricks, and techniques to help them not only survive but enjoy the holidays despite the  onslaught of food that accompanies the holidays.   Knowledge Questionnaire Score: Knowledge Questionnaire Score - 04/01/19 1510      Knowledge Questionnaire Score   Pre Score  14/18       Core Components/Risk Factors/Patient Goals at Admission: Personal Goals and Risk Factors at Admission - 04/01/19 1511      Core Components/Risk Factors/Patient Goals on Admission   Improve shortness of breath with ADL's  Yes    Intervention  Provide education, individualized exercise plan and daily activity instruction to help decrease symptoms of SOB with activities of daily living.    Expected Outcomes  Short Term: Improve cardiorespiratory fitness to achieve a reduction of symptoms when performing ADLs;Long Term: Be able to perform more ADLs without symptoms or delay the onset of symptoms       Core Components/Risk Factors/Patient Goals Review:  Goals and Risk Factor Review    Row Name 04/01/19 1512 04/12/19 1314 05/10/19 1315         Core Components/Risk Factors/Patient Goals Review   Personal Goals Review  Develop more efficient breathing techniques such as purse lipped breathing and diaphragmatic breathing and practicing self-pacing with activity.;Increase knowledge of respiratory medications and ability to use respiratory devices properly.;Improve shortness of breath with ADL's  Develop more efficient breathing techniques such as purse lipped breathing and diaphragmatic breathing and practicing self-pacing with activity.;Increase knowledge of respiratory medications and ability to use respiratory devices properly.;Improve shortness of breath with ADL's  Develop more efficient breathing techniques such as purse lipped breathing and diaphragmatic breathing and practicing self-pacing with activity.;Increase knowledge of respiratory medications and ability to use respiratory devices properly.;Improve shortness of  breath with ADL's     Review  --  Aidan just started program, has attended 2 exercise  sessions, is currently walking on the treadmill @ 1.6 mph and 1% incline, and level 3 on the nustep.  He is very positive and seems to enjoy participating.  Kamron is a joy to have in the program, he works hard and has made good progress, he feels he has become stronger and is performing yard chores with more ease, he is walking on the treadmill @ 1.25mh/1% incline, and level 4 on the nustep.     Expected Outcomes  --  See admission goals  See admission goals.        Core Components/Risk Factors/Patient Goals at Discharge (Final Review):  Goals and Risk Factor Review - 05/10/19 1315      Core Components/Risk Factors/Patient Goals Review   Personal Goals Review  Develop more efficient breathing techniques such as purse lipped breathing and diaphragmatic breathing and practicing self-pacing with activity.;Increase knowledge of respiratory medications and ability to use respiratory devices properly.;Improve shortness of breath with ADL's    Review  RSahiris a joy to have in the program, he works hard and has made good progress, he feels he has become stronger and is performing yard chores with more ease, he is walking on the treadmill @ 1.843m/1% incline, and level 4 on the nustep.    Expected Outcomes  See admission goals.       ITP Comments:   Comments: ITP REVIEW Pt is making expected progress toward pulmonary rehab goals after completing 9 sessions. Recommend continued exercise, life style modification, education, and utilization of breathing techniques to increase stamina and strength and decrease shortness of breath with exertion.

## 2019-05-13 ENCOUNTER — Other Ambulatory Visit: Payer: Self-pay

## 2019-05-13 ENCOUNTER — Telehealth: Payer: Self-pay | Admitting: Pulmonary Disease

## 2019-05-13 ENCOUNTER — Encounter (HOSPITAL_COMMUNITY)
Admission: RE | Admit: 2019-05-13 | Discharge: 2019-05-13 | Disposition: A | Discharge status: Home / Self Care | Payer: Medicare Other | Source: Ambulatory Visit | Attending: Pulmonary Disease | Admitting: Pulmonary Disease

## 2019-05-13 DIAGNOSIS — G4733 Obstructive sleep apnea (adult) (pediatric): Secondary | ICD-10-CM

## 2019-05-13 DIAGNOSIS — R0602 Shortness of breath: Secondary | ICD-10-CM

## 2019-05-13 NOTE — Progress Notes (Signed)
Daily Session Note  Patient Details  Name: Rodney Burns MRN: [id number] Date of Birth: 06/30/1945 Referring Provider:     Pulmonary Rehab Walk Test from 04/01/2019 in [place]  Referring Provider  Dr. [name]      Encounter Date: 05/13/2019  Check In: Session Check In - 05/13/19 1054      Check-In   Supervising physician immediately available to respond to emergencies  [facility] immediately available    Physician(s)  Dr. Dessa Phi    Location  [facility]    Staff Present  Rosebud Poles, RN, Bjorn Loser, MS, Exercise Physiologist;Gari Trovato Ysidro Evert, RN    Virtual Visit  No    Medication changes reported      No    Fall or balance concerns reported     No    Tobacco Cessation  No Change    Warm-up and Cool-down  Performed as group-led instruction    Resistance Training Performed  Yes    VAD Patient?  No    PAD/SET Patient?  No      Pain Assessment   Currently in Pain?  No/denies    Multiple Pain Sites  No       Capillary Blood Glucose: No results found for this or any previous visit (from the past 24 hour(s)).    Social History   Tobacco Use  Smoking Status Former Smoker  . Packs/day: 2.00  . Years: 30.00  . Pack years: 60.00  . Types: Cigarettes  . Quit date: 01/22/1992  . Years since quitting: 27.3  Smokeless Tobacco Never Used    Goals Met:  Exercise tolerated well No report of cardiac concerns or symptoms Strength training completed today  Goals Unmet:  Not Applicable  Comments: Service time is from 1030 to 1130    Dr. Fransico Him is Medical Director for Cardiac Rehab at [facility].

## 2019-05-13 NOTE — Telephone Encounter (Signed)
Dr. [name] please advise on CPAP titration results.

## 2019-05-14 NOTE — Telephone Encounter (Signed)
Reviewed titration study He will need CPAP 14 cm H2O. Please order.

## 2019-05-14 NOTE — Telephone Encounter (Signed)
Called and spoke with pt letting him know the results of the titration study and pt verbalized understanding. Order placed for pt's cpap start. Nothing further needed.

## 2019-05-18 ENCOUNTER — Other Ambulatory Visit: Payer: Self-pay

## 2019-05-18 ENCOUNTER — Encounter (HOSPITAL_COMMUNITY)
Admission: RE | Admit: 2019-05-18 | Discharge: 2019-05-18 | Disposition: A | Discharge status: Home / Self Care | Payer: Medicare Other | Source: Ambulatory Visit | Attending: Pulmonary Disease | Admitting: Pulmonary Disease

## 2019-05-18 DIAGNOSIS — R0602 Shortness of breath: Secondary | ICD-10-CM

## 2019-05-18 NOTE — Progress Notes (Signed)
Daily Session Note  Patient Details  Name: JAMYSON JIRAK MRN: [id number] Date of Birth: 1945-12-22 Referring Provider:     Pulmonary Rehab Walk Test from 04/01/2019 in [place]  Referring Provider  Dr. [name]      Encounter Date: 05/18/2019  Check In: Session Check In - 05/18/19 1154      Check-In   Supervising physician immediately available to respond to emergencies  [facility] immediately available    Physician(s)  Dr. Maylene Roes    Location  [facility]    Staff Present  Rosebud Poles, RN, Bjorn Loser, MS, Exercise Physiologist;Shawnda Mauney Ysidro Evert, RN    Virtual Visit  No    Medication changes reported      No    Fall or balance concerns reported     No    Tobacco Cessation  No Change    Warm-up and Cool-down  Performed on first and last piece of equipment    Resistance Training Performed  Yes    VAD Patient?  No    PAD/SET Patient?  No      Pain Assessment   Currently in Pain?  No/denies    Multiple Pain Sites  No       Capillary Blood Glucose: No results found for this or any previous visit (from the past 24 hour(s)).    Social History   Tobacco Use  Smoking Status Former Smoker  . Packs/day: 2.00  . Years: 30.00  . Pack years: 60.00  . Types: Cigarettes  . Quit date: 01/22/1992  . Years since quitting: 27.3  Smokeless Tobacco Never Used    Goals Met:  Exercise tolerated well No report of cardiac concerns or symptoms Strength training completed today  Goals Unmet:  Not Applicable  Comments: Service time is from 1030 to 1125     Dr. Fransico Him is Medical Director for Cardiac Rehab at [facility].

## 2019-05-19 DIAGNOSIS — N400 Enlarged prostate without lower urinary tract symptoms: Secondary | ICD-10-CM | POA: Diagnosis not present

## 2019-05-19 DIAGNOSIS — R3121 Asymptomatic microscopic hematuria: Secondary | ICD-10-CM | POA: Diagnosis not present

## 2019-05-19 DIAGNOSIS — Z87442 Personal history of urinary calculi: Secondary | ICD-10-CM | POA: Diagnosis not present

## 2019-05-20 ENCOUNTER — Encounter (HOSPITAL_COMMUNITY)
Admission: RE | Admit: 2019-05-20 | Discharge: 2019-05-20 | Disposition: A | Discharge status: Home / Self Care | Payer: Medicare Other | Source: Ambulatory Visit | Attending: Pulmonary Disease | Admitting: Pulmonary Disease

## 2019-05-20 ENCOUNTER — Other Ambulatory Visit: Payer: Self-pay

## 2019-05-20 DIAGNOSIS — R0602 Shortness of breath: Secondary | ICD-10-CM

## 2019-05-20 NOTE — Progress Notes (Signed)
Daily Session Note  Patient Details  Name: Rodney Burns MRN: [id number] Date of Birth: [birth year] Referring Provider:     Pulmonary Rehab Walk Test from 04/01/2019 in [place]  Referring Provider  Dr. [name]      Encounter Date: 05/20/2019  Check In: Session Check In - 05/20/19 1200      Check-In   Supervising physician immediately available to respond to emergencies  [facility] immediately available    Physician(s)  Dr. Sherral Hammers    Location  [facility]    Staff Present  Rosebud Poles, RN, Bjorn Loser, MS, Exercise Physiologist;Odalys Win Ysidro Evert, RN    Virtual Visit  No    Medication changes reported      No    Fall or balance concerns reported     No    Tobacco Cessation  No Change    Warm-up and Cool-down  Performed on first and last piece of equipment    Resistance Training Performed  Yes    VAD Patient?  No    PAD/SET Patient?  No      Pain Assessment   Currently in Pain?  No/denies    Multiple Pain Sites  No       Capillary Blood Glucose: No results found for this or any previous visit (from the past 24 hour(s)).    Social History   Tobacco Use  Smoking Status Former Smoker  . Packs/day: 2.00  . Years: 30.00  . Pack years: 60.00  . Types: Cigarettes  . Quit date: 01/22/1992  . Years since quitting: 27.3  Smokeless Tobacco Never Used    Goals Met:  Exercise tolerated well No report of cardiac concerns or symptoms Strength training completed today  Goals Unmet:  Not Applicable  Comments: Service time is from 1038 to 1133    Dr. Fransico Him is Medical Director for Cardiac Rehab at [facility].

## 2019-05-21 ENCOUNTER — Telehealth: Payer: Self-pay | Admitting: Pulmonary Disease

## 2019-05-21 NOTE — Telephone Encounter (Signed)
Called and spoke with pt. Stated to pt that we gave him the results of the titration study and that we sent rx for cpap start. Pt stated that he hasn't heard anything about beginning on cpap and I walked pt through the process that has to be done before he will receive the cpap. Stated to him that he will receive call from DME about getting machine and equipment that goes with cpap and he verbalized understanding. Nothing further needed.

## 2019-05-25 ENCOUNTER — Encounter (HOSPITAL_COMMUNITY)
Admission: RE | Admit: 2019-05-25 | Discharge: 2019-05-25 | Disposition: A | Discharge status: Home / Self Care | Payer: Medicare Other | Source: Ambulatory Visit | Attending: Pulmonary Disease | Admitting: Pulmonary Disease

## 2019-05-25 ENCOUNTER — Other Ambulatory Visit: Payer: Self-pay

## 2019-05-25 VITALS — Wt 211.0 lb

## 2019-05-25 DIAGNOSIS — R0602 Shortness of breath: Secondary | ICD-10-CM | POA: Insufficient documentation

## 2019-05-25 NOTE — Progress Notes (Signed)
Daily Session Note  Patient Details  Name: Rodney Burns MRN: [id number] Date of Birth: 12-23-1945 Referring Provider:     Pulmonary Rehab Walk Test from 04/01/2019 in [place]  Referring Provider  Dr. [name]      Encounter Date: 05/25/2019  Check In: Session Check In - 05/25/19 1104      Check-In   Supervising physician immediately available to respond to emergencies  [facility] immediately available    Physician(s)  Dr. Philis Pique    Location  [facility]    Staff Present  Rosebud Poles, RN, Bjorn Loser, MS, Exercise Physiologist;Sherilynn Dieu Ysidro Evert, RN    Virtual Visit  No    Medication changes reported      No    Fall or balance concerns reported     No    Tobacco Cessation  No Change    Warm-up and Cool-down  Performed as group-led instruction    Resistance Training Performed  Yes    VAD Patient?  No    PAD/SET Patient?  No      Pain Assessment   Currently in Pain?  No/denies    Multiple Pain Sites  No       Capillary Blood Glucose: No results found for this or any previous visit (from the past 24 hour(s)).  Exercise Prescription Changes - 05/25/19 1200      Response to Exercise   Blood Pressure (Admit)  124/76    Blood Pressure (Exercise)  118/60    Blood Pressure (Exit)  96/58    Heart Rate (Admit)  60 bpm    Heart Rate (Exercise)  80 bpm    Heart Rate (Exit)  58 bpm    Oxygen Saturation (Admit)  100 %    Oxygen Saturation (Exercise)  97 %    Oxygen Saturation (Exit)  98 %    Rating of Perceived Exertion (Exercise)  12    Perceived Dyspnea (Exercise)  2    Duration  Continue with 30 min of aerobic exercise without signs/symptoms of physical distress.    Intensity  THRR unchanged      Progression   Progression  Continue to progress workloads to maintain intensity without signs/symptoms of physical distress.      Resistance Training   Training Prescription  No    Weight  blue bands    Reps  10-15    Time  10 Minutes      Interval Training   Interval Training  No      Oxygen   Oxygen  Continuous    Liters  4      Treadmill   MPH  1.8    Grade  1    Minutes  15      NuStep   Level  5    SPM  80    Minutes  15    METs  2.7       Social History   Tobacco Use  Smoking Status Former Smoker  . Packs/day: 2.00  . Years: 30.00  . Pack years: 60.00  . Types: Cigarettes  . Quit date: 01/22/1992  . Years since quitting: 27.3  Smokeless Tobacco Never Used    Goals Met:  Exercise tolerated well No report of cardiac concerns or symptoms Strength training completed today  Goals Unmet:  Not Applicable  Comments: Service time is from 1030 to 1125    Dr. Fransico Him is Medical Director for Cardiac Rehab at  [facility].

## 2019-05-27 ENCOUNTER — Other Ambulatory Visit: Payer: Self-pay

## 2019-05-27 ENCOUNTER — Encounter (HOSPITAL_COMMUNITY)
Admission: RE | Admit: 2019-05-27 | Discharge: 2019-05-27 | Disposition: A | Discharge status: Home / Self Care | Payer: Medicare Other | Source: Ambulatory Visit | Attending: Pulmonary Disease | Admitting: Pulmonary Disease

## 2019-05-27 DIAGNOSIS — R0602 Shortness of breath: Secondary | ICD-10-CM

## 2019-05-27 NOTE — Progress Notes (Signed)
Daily Session Note  Patient Details  Name: Rodney Burns MRN: [id number] Date of Birth: [birth year] Referring Provider:     Pulmonary Rehab Walk Test from 04/01/2019 in [place]  Referring Provider  Dr. [name]      Encounter Date: 05/27/2019  Check In: Session Check In - 05/27/19 1038      Check-In   Supervising physician immediately available to respond to emergencies  [facility] immediately available    Physician(s)  Dr. Posey Pronto    Location  [facility]    Staff Present  Rosebud Poles, RN, Bjorn Loser, MS, Exercise Physiologist;Garry Nicolini Ysidro Evert, RN    Virtual Visit  No    Medication changes reported      No    Fall or balance concerns reported     No    Tobacco Cessation  No Change    Warm-up and Cool-down  Performed as group-led instruction    Resistance Training Performed  Yes    VAD Patient?  No    PAD/SET Patient?  No      Pain Assessment   Currently in Pain?  No/denies    Multiple Pain Sites  No       Capillary Blood Glucose: No results found for this or any previous visit (from the past 24 hour(s)).    Social History   Tobacco Use  Smoking Status Former Smoker  . Packs/day: 2.00  . Years: 30.00  . Pack years: 60.00  . Types: Cigarettes  . Quit date: 01/22/1992  . Years since quitting: 27.3  Smokeless Tobacco Never Used    Goals Met:  Exercise tolerated well No report of cardiac concerns or symptoms Strength training completed today  Goals Unmet:  Not Applicable  Comments: Service time is from 1030 to 1120    Dr. Fransico Him is Medical Director for Cardiac Rehab at [facility].

## 2019-05-31 DIAGNOSIS — R3129 Other microscopic hematuria: Secondary | ICD-10-CM | POA: Diagnosis not present

## 2019-05-31 DIAGNOSIS — R3121 Asymptomatic microscopic hematuria: Secondary | ICD-10-CM | POA: Diagnosis not present

## 2019-06-01 ENCOUNTER — Encounter (HOSPITAL_COMMUNITY)
Admission: RE | Admit: 2019-06-01 | Discharge: 2019-06-01 | Disposition: A | Discharge status: Home / Self Care | Payer: Medicare Other | Source: Ambulatory Visit | Attending: Pulmonary Disease | Admitting: Pulmonary Disease

## 2019-06-01 ENCOUNTER — Other Ambulatory Visit: Payer: Self-pay

## 2019-06-01 DIAGNOSIS — R0602 Shortness of breath: Secondary | ICD-10-CM | POA: Diagnosis not present

## 2019-06-01 NOTE — Progress Notes (Signed)
Daily Session Note  Patient Details  Name: Rodney Burns MRN: [id number] Date of Birth: June 27, 1945 Referring Provider:     Pulmonary Rehab Walk Test from 04/01/2019 in [place]  Referring Provider  Dr. [name]      Encounter Date: 06/01/2019  Check In: Session Check In - 06/01/19 [place]      Check-In   Supervising physician immediately available to respond to emergencies  [facility] immediately available    Physician(s)  Dr. Posey Pronto    Location  [facility]    Staff Present  Rosebud Poles, RN, Bjorn Loser, MS, Exercise Physiologist;Ceirra Belli Ysidro Evert, RN    Virtual Visit  No    Medication changes reported      No    Fall or balance concerns reported     No    Tobacco Cessation  No Change    Warm-up and Cool-down  Performed on first and last piece of equipment    Resistance Training Performed  Yes    VAD Patient?  No    PAD/SET Patient?  No      Pain Assessment   Currently in Pain?  No/denies    Multiple Pain Sites  No       Capillary Blood Glucose: No results found for this or any previous visit (from the past 24 hour(s)).    Social History   Tobacco Use  Smoking Status Former Smoker  . Packs/day: 2.00  . Years: 30.00  . Pack years: 60.00  . Types: Cigarettes  . Quit date: 01/22/1992  . Years since quitting: 27.3  Smokeless Tobacco Never Used    Goals Met:  Exercise tolerated well No report of cardiac concerns or symptoms Strength training completed today  Goals Unmet:  Not Applicable  Comments: Service time is from 1033 to 1137    Dr. Fransico Him is Medical Director for Cardiac Rehab at [facility].

## 2019-06-03 ENCOUNTER — Other Ambulatory Visit: Payer: Self-pay

## 2019-06-03 ENCOUNTER — Encounter (HOSPITAL_COMMUNITY)
Admission: RE | Admit: 2019-06-03 | Discharge: 2019-06-03 | Disposition: A | Discharge status: Home / Self Care | Payer: Medicare Other | Source: Ambulatory Visit | Attending: Pulmonary Disease | Admitting: Pulmonary Disease

## 2019-06-03 DIAGNOSIS — R0602 Shortness of breath: Secondary | ICD-10-CM

## 2019-06-03 NOTE — Progress Notes (Signed)
Daily Session Note  Patient Details  Name: Rodney Burns MRN: [id number] Date of Birth: 11/16/1945 Referring Provider:     Pulmonary Rehab Walk Test from 04/01/2019 in [place]  Referring Provider  Dr. [name]      Encounter Date: 06/03/2019  Check In: Session Check In - 06/03/19 1048      Check-In   Supervising physician immediately available to respond to emergencies  [facility] immediately available    Physician(s)  Dr. Cyndia Skeeters    Location  [facility]    Staff Present  Rosebud Poles, RN, Roque Cash, RN    Virtual Visit  No    Medication changes reported      No    Fall or balance concerns reported     No    Tobacco Cessation  No Change    Warm-up and Cool-down  Performed as group-led instruction    Resistance Training Performed  Yes    VAD Patient?  No    PAD/SET Patient?  No      Pain Assessment   Currently in Pain?  No/denies    Multiple Pain Sites  No       Capillary Blood Glucose: No results found for this or any previous visit (from the past 24 hour(s)).    Social History   Tobacco Use  Smoking Status Former Smoker  . Packs/day: 2.00  . Years: 30.00  . Pack years: 60.00  . Types: Cigarettes  . Quit date: 01/22/1992  . Years since quitting: 27.3  Smokeless Tobacco Never Used    Goals Met:  Exercise tolerated well No report of cardiac concerns or symptoms Strength training completed today  Goals Unmet:  Not Applicable  Comments: Service time is from 1025 to 1120    Dr. Fransico Him is Medical Director for Cardiac Rehab at [facility].

## 2019-06-14 NOTE — Progress Notes (Signed)
Discharge Progress Report  Patient Details  Name: Rodney Burns MRN: [id number] Date of Birth: 01/07/1946 Referring Provider:     Pulmonary Rehab Walk Test from 04/01/2019 in [place]  Referring Provider  Dr. [name]       Number of Visits: 16  Reason for Discharge:  Patient reached a stable level of exercise. Patient independent in their exercise. Patient has met program and personal goals.  Smoking History:  Social History   Tobacco Use  Smoking Status Former Smoker  . Packs/day: 2.00  . Years: 30.00  . Pack years: 60.00  . Types: Cigarettes  . Quit date: 01/22/1992  . Years since quitting: 27.4  Smokeless Tobacco Never Used    Diagnosis:  Shortness of breath  ADL [facility]: Pulmonary Assessment Scores    Row Name 04/01/19 1508 04/01/19 1549 06/01/19 1153     ADL [facility]   ADL Phase  Entry  Entry  Exit   SOB Score total  32  -  -     CAT Score   CAT Score  26  -  -     mMRC Score   mMRC Score  -  2  2      Initial Exercise Prescription: Initial Exercise Prescription - 04/01/19 1500      Date of Initial Exercise RX and Referring Provider   Date  04/01/19    Referring Provider  Dr. [name]      Oxygen   Oxygen  Continuous    Liters  4      Treadmill   MPH  1.6    Grade  0    Minutes  15      NuStep   Level  2    SPM  80    Minutes  15      Prescription Details   Frequency (times per week)  2    Duration  Progress to 30 minutes of continuous aerobic without signs/symptoms of physical distress      Intensity   THRR 40-80% of Max Heartrate  58-117    Ratings of Perceived Exertion  11-13    Perceived Dyspnea  0-4      Progression   Progression  Continue progressive overload as per policy without signs/symptoms or physical distress.      Resistance Training   Training Prescription  Yes    Weight  blue bands    Reps  10-15       Discharge Exercise Prescription (Final Exercise Prescription Changes): Exercise  Prescription Changes - 05/25/19 1200      Response to Exercise   Blood Pressure (Admit)  124/76    Blood Pressure (Exercise)  118/60    Blood Pressure (Exit)  96/58    Heart Rate (Admit)  60 bpm    Heart Rate (Exercise)  80 bpm    Heart Rate (Exit)  58 bpm    Oxygen Saturation (Admit)  100 %    Oxygen Saturation (Exercise)  97 %    Oxygen Saturation (Exit)  98 %    Rating of Perceived Exertion (Exercise)  12    Perceived Dyspnea (Exercise)  2    Duration  Continue with 30 min of aerobic exercise without signs/symptoms of physical distress.    Intensity  THRR unchanged      Progression   Progression  Continue to progress workloads to maintain intensity without signs/symptoms of physical distress.      Horticulturist, commercial  Prescription  No    Weight  blue bands    Reps  10-15    Time  10 Minutes      Interval Training   Interval Training  No      Oxygen   Oxygen  Continuous    Liters  4      Treadmill   MPH  1.8    Grade  1    Minutes  15      NuStep   Level  5    SPM  80    Minutes  15    METs  2.7       Functional Capacity: 6 Minute Walk    Row Name 04/01/19 1549 06/01/19 1153       6 Minute Walk   Phase  Initial  Discharge    Distance  1332 feet  1347 feet    Distance % Change  -  1.13 %    Distance Feet Change  -  15 ft    Walk Time  6 minutes  6 minutes    # of Rest Breaks  0  0    MPH  2.52  2.55    METS  2.79  2.54    RPE  13  13    Perceived Dyspnea   2  2    VO2 Peak  9.76  8.89    Symptoms  No  No    Resting HR  74 bpm  68 bpm    Resting BP  124/64  126/74    Resting Oxygen Saturation   97 %  100 %    Exercise Oxygen Saturation  during 6 min walk  92 %  92 %    Max Ex. HR  114 bpm  84 bpm    Max Ex. BP  144/66  140/62    2 Minute Post BP  130/68  114/68      Interval HR   1 Minute HR  112  70    2 Minute HR  114  74    3 Minute HR  113  76    4 Minute HR  111  84    5 Minute HR  111  79    6 Minute HR  112  82    2  Minute Post HR  76  70    Interval Heart Rate?  Yes  Yes      Interval Oxygen   Interval Oxygen?  Yes  Yes    Baseline Oxygen Saturation %  97 %  100 %    1 Minute Oxygen Saturation %  95 %  100 %    1 Minute Liters of Oxygen  4 L  4 L    2 Minute Oxygen Saturation %  93 %  97 %    2 Minute Liters of Oxygen  4 L  4 L    3 Minute Oxygen Saturation %  93 %  95 %    3 Minute Liters of Oxygen  4 L  4 L    4 Minute Oxygen Saturation %  93 %  96 %    4 Minute Liters of Oxygen  4 L  4 L    5 Minute Oxygen Saturation %  92 %  92 %    5 Minute Liters of Oxygen  4 L  4 L    6 Minute Oxygen Saturation %  92 %  94 %  6 Minute Liters of Oxygen  4 L  4 L    2 Minute Post Oxygen Saturation %  97 %  100 %    2 Minute Post Liters of Oxygen  2 L  4 L       Psychological, QOL, Others - Outcomes: PHQ 2/9: Depression screen PHQ 2/9 04/01/2019  Decreased Interest 0  Down, Depressed, Hopeless 0  PHQ - 2 Score 0  Altered sleeping 0  Tired, decreased energy 0  Change in appetite 0  Feeling bad or failure about yourself  0  Trouble concentrating 0  Moving slowly or fidgety/restless 0  Suicidal thoughts 0  Difficult doing work/chores Not difficult at all    Quality of Life:   Personal Goals: Goals established at orientation with interventions provided to work toward goal. Personal Goals and Risk Factors at Admission - 04/01/19 1511      Core Components/Risk Factors/Patient Goals on Admission   Improve shortness of breath with ADL's  Yes    Intervention  Provide education, individualized exercise plan and daily activity instruction to help decrease symptoms of SOB with activities of daily living.    Expected Outcomes  Short Term: Improve cardiorespiratory fitness to achieve a reduction of symptoms when performing ADLs;Long Term: Be able to perform more ADLs without symptoms or delay the onset of symptoms        Personal Goals Discharge: Goals and Risk Factor Review    Row Name 04/01/19  1512 04/12/19 1314 05/10/19 1315         Core Components/Risk Factors/Patient Goals Review   Personal Goals Review  Develop more efficient breathing techniques such as purse lipped breathing and diaphragmatic breathing and practicing self-pacing with activity.;Increase knowledge of respiratory medications and ability to use respiratory devices properly.;Improve shortness of breath with ADL's  Develop more efficient breathing techniques such as purse lipped breathing and diaphragmatic breathing and practicing self-pacing with activity.;Increase knowledge of respiratory medications and ability to use respiratory devices properly.;Improve shortness of breath with ADL's  Develop more efficient breathing techniques such as purse lipped breathing and diaphragmatic breathing and practicing self-pacing with activity.;Increase knowledge of respiratory medications and ability to use respiratory devices properly.;Improve shortness of breath with ADL's     Review  -  Rodney Burns just started program, has attended 2 exercise sessions, is currently walking on the treadmill @ 1.6 mph and 1% incline, and level 3 on the nustep.  He is very positive and seems to enjoy participating.  Rodney Burns is a joy to have in the program, he works hard and has made good progress, he feels he has become stronger and is performing yard chores with more ease, he is walking on the treadmill @ 1.14mh/1% incline, and level 4 on the nustep.     Expected Outcomes  -  See admission goals  See admission goals.        Exercise Goals and Review: Exercise Goals    Row Name 04/01/19 1556 04/13/19 0712 05/11/19 0747         Exercise Goals   Increase Physical Activity  Yes  Yes  Yes     Intervention  Provide advice, education, support and counseling about physical activity/exercise needs.;Develop an individualized exercise prescription for aerobic and resistive training based on initial evaluation findings, risk stratification, comorbidities and  participant's personal goals.  Provide advice, education, support and counseling about physical activity/exercise needs.;Develop an individualized exercise prescription for aerobic and resistive training based on initial evaluation findings, risk stratification, comorbidities  and participant's personal goals.  Provide advice, education, support and counseling about physical activity/exercise needs.;Develop an individualized exercise prescription for aerobic and resistive training based on initial evaluation findings, risk stratification, comorbidities and participant's personal goals.     Expected Outcomes  Short Term: Attend rehab on a regular basis to increase amount of physical activity.;Long Term: Add in home exercise to make exercise part of routine and to increase amount of physical activity.;Long Term: Exercising regularly at least 3-5 days a week.  Short Term: Attend rehab on a regular basis to increase amount of physical activity.;Long Term: Add in home exercise to make exercise part of routine and to increase amount of physical activity.;Long Term: Exercising regularly at least 3-5 days a week.  Short Term: Attend rehab on a regular basis to increase amount of physical activity.;Long Term: Add in home exercise to make exercise part of routine and to increase amount of physical activity.;Long Term: Exercising regularly at least 3-5 days a week.     Increase Strength and Stamina  Yes  Yes  Yes     Intervention  Provide advice, education, support and counseling about physical activity/exercise needs.;Develop an individualized exercise prescription for aerobic and resistive training based on initial evaluation findings, risk stratification, comorbidities and participant's personal goals.  Provide advice, education, support and counseling about physical activity/exercise needs.;Develop an individualized exercise prescription for aerobic and resistive training based on initial evaluation findings, risk  stratification, comorbidities and participant's personal goals.  Provide advice, education, support and counseling about physical activity/exercise needs.;Develop an individualized exercise prescription for aerobic and resistive training based on initial evaluation findings, risk stratification, comorbidities and participant's personal goals.     Expected Outcomes  Short Term: Increase workloads from initial exercise prescription for resistance, speed, and METs.;Short Term: Perform resistance training exercises routinely during rehab and add in resistance training at home;Long Term: Improve cardiorespiratory fitness, muscular endurance and strength as measured by increased METs and functional capacity (6MWT)  Short Term: Increase workloads from initial exercise prescription for resistance, speed, and METs.;Short Term: Perform resistance training exercises routinely during rehab and add in resistance training at home;Long Term: Improve cardiorespiratory fitness, muscular endurance and strength as measured by increased METs and functional capacity (6MWT)  Short Term: Increase workloads from initial exercise prescription for resistance, speed, and METs.;Short Term: Perform resistance training exercises routinely during rehab and add in resistance training at home;Long Term: Improve cardiorespiratory fitness, muscular endurance and strength as measured by increased METs and functional capacity (6MWT)     Able to understand and use rate of perceived exertion (RPE) scale  Yes  Yes  Yes     Intervention  Provide education and explanation on how to use RPE scale  Provide education and explanation on how to use RPE scale  Provide education and explanation on how to use RPE scale     Expected Outcomes  Short Term: Able to use RPE daily in rehab to express subjective intensity level;Long Term:  Able to use RPE to guide intensity level when exercising independently  Short Term: Able to use RPE daily in rehab to express  subjective intensity level;Long Term:  Able to use RPE to guide intensity level when exercising independently  Short Term: Able to use RPE daily in rehab to express subjective intensity level;Long Term:  Able to use RPE to guide intensity level when exercising independently     Able to understand and use Dyspnea scale  Yes  Yes  Yes     Intervention  Provide education and explanation on how to use Dyspnea scale  Provide education and explanation on how to use Dyspnea scale  Provide education and explanation on how to use Dyspnea scale     Expected Outcomes  Short Term: Able to use Dyspnea scale daily in rehab to express subjective sense of shortness of breath during exertion;Long Term: Able to use Dyspnea scale to guide intensity level when exercising independently  Short Term: Able to use Dyspnea scale daily in rehab to express subjective sense of shortness of breath during exertion;Long Term: Able to use Dyspnea scale to guide intensity level when exercising independently  Short Term: Able to use Dyspnea scale daily in rehab to express subjective sense of shortness of breath during exertion;Long Term: Able to use Dyspnea scale to guide intensity level when exercising independently     Knowledge and understanding of Target Heart Rate Range (THRR)  Yes  Yes  Yes     Intervention  Provide education and explanation of THRR including how the numbers were predicted and where they are located for reference  Provide education and explanation of THRR including how the numbers were predicted and where they are located for reference  Provide education and explanation of THRR including how the numbers were predicted and where they are located for reference     Expected Outcomes  Short Term: Able to state/look up THRR;Short Term: Able to use daily as guideline for intensity in rehab;Long Term: Able to use THRR to govern intensity when exercising independently  Short Term: Able to state/look up THRR;Short Term: Able to  use daily as guideline for intensity in rehab;Long Term: Able to use THRR to govern intensity when exercising independently  Short Term: Able to state/look up THRR;Short Term: Able to use daily as guideline for intensity in rehab;Long Term: Able to use THRR to govern intensity when exercising independently     Understanding of Exercise Prescription  Yes  Yes  Yes     Intervention  Provide education, explanation, and written materials on patient's individual exercise prescription  Provide education, explanation, and written materials on patient's individual exercise prescription  Provide education, explanation, and written materials on patient's individual exercise prescription     Expected Outcomes  Short Term: Able to explain program exercise prescription  Short Term: Able to explain program exercise prescription  Short Term: Able to explain program exercise prescription        Exercise Goals Re-Evaluation: Exercise Goals Re-Evaluation    Row Name 04/13/19 0998 04/13/19 1146 05/11/19 0747         Exercise Goal Re-Evaluation   Exercise Goals Review  Increase Physical Activity;Increase Strength and Stamina;Able to understand and use rate of perceived exertion (RPE) scale;Able to understand and use Dyspnea scale;Knowledge and understanding of Target Heart Rate Range (THRR);Understanding of Exercise Prescription  -  Increase Physical Activity;Increase Strength and Stamina;Able to understand and use rate of perceived exertion (RPE) scale;Able to understand and use Dyspnea scale;Knowledge and understanding of Target Heart Rate Range (THRR);Understanding of Exercise Prescription     Comments  Pt has attended 2 exercise sessions. Pt is motivated and I expect him to progress well. He currently exercises at 2.2 METs on the stepper. Will continue to monitor and progress as able.  -  Pt has attended 8 exercise sessions. He continues to stay motivated and is progressing well. He is currently exercising at 2.9  METs on the stepper. Will continue to monitor and progress as able.     Expected  Outcomes  Through exercise at rehab and at home, the patient will decrease shortness of breath with daily activities and feel confident in carrying out an exercise regime at home.  Through exercise at rehab and at home, the patient will decrease shortness of breath with daily activities and feel confident in carrying out an exercise regime at home.  Through exercise at rehab and at home, the patient will decrease shortness of breath with daily activities and feel confident in carrying out an exercise regime at home.        Nutrition & Weight - Outcomes: Pre Biometrics - 04/01/19 1449      Pre Biometrics   Height  '5\' 10"'  (1.778 m)    Weight  99.2 kg    BMI (Calculated)  31.38    Grip Strength  40 kg        Nutrition: Nutrition Therapy & Goals - 04/12/19 1023      Nutrition Therapy   Diet  Carb modified      Personal Nutrition Goals   Nutrition Goal  Pt to build a healthy plate including vegetables, fruits, whole grains, and low-fat dairy products in a heart healthy meal plan.    Personal Goal #2  Pt to limit carbohydrates to <75 g per meal and <30 g per snack    Personal Goal #3  Pt to increase fiber intake to 28-40 g/day      Intervention Plan   Intervention  Prescribe, educate and counsel regarding individualized specific dietary modifications aiming towards targeted core components such as weight, hypertension, lipid management, diabetes, heart failure and other comorbidities.;Nutrition handout(s) given to patient.    Expected Outcomes  Short Term Goal: A plan has been developed with personal nutrition goals set during dietitian appointment.;Long Term Goal: Adherence to prescribed nutrition plan.       Nutrition Discharge: Nutrition Assessments - 04/22/19 1122      Rate Your Plate Scores   Pre Score  32       Education Questionnaire Score: Knowledge Questionnaire Score - 04/01/19 1510       Knowledge Questionnaire Score   Pre Score  14/18       Goals reviewed with patient; copy given to patient.

## 2019-06-25 DIAGNOSIS — E785 Hyperlipidemia, unspecified: Secondary | ICD-10-CM | POA: Diagnosis not present

## 2019-06-25 DIAGNOSIS — I2510 Atherosclerotic heart disease of native coronary artery without angina pectoris: Secondary | ICD-10-CM | POA: Diagnosis not present

## 2019-06-25 DIAGNOSIS — I5020 Unspecified systolic (congestive) heart failure: Secondary | ICD-10-CM | POA: Diagnosis not present

## 2019-06-25 DIAGNOSIS — I10 Essential (primary) hypertension: Secondary | ICD-10-CM | POA: Diagnosis not present

## 2019-07-05 ENCOUNTER — Telehealth: Payer: Self-pay | Admitting: Pulmonary Disease

## 2019-07-05 DIAGNOSIS — U071 COVID-19: Secondary | ICD-10-CM

## 2019-07-05 NOTE — Telephone Encounter (Signed)
Spoke with the pt  He states now that he is using CPAP wants to know if he can have DME pick up o2 equipment  He used to use 2LPM with sleep before he began CPAP  He used 4 lpm with exertion only when he did cardiac rehab and this was done with 3 wks ago  Please advise, thanks!

## 2019-07-06 NOTE — Telephone Encounter (Signed)
Ok to have DME pick up oxygen

## 2019-07-08 ENCOUNTER — Telehealth: Payer: Self-pay | Admitting: Pulmonary Disease

## 2019-07-08 NOTE — Telephone Encounter (Signed)
Called and spoke with pt letting him know that Dr. [name] said it as okay to have O2 discontinued and that an order was placed for this to be handled. Pt verbalized understanding. Nothing further needed.

## 2019-07-16 NOTE — Progress Notes (Signed)
Cardiology Office Note:    Date:  07/20/2019   ID:  Rodney Burns, DOB 09-Jun-1945, MRN [id number]  PCP:  Hulan Fess, MD  Cardiologist:  Olyvia Gopal Martinique, MD  Electrophysiologist:  None   Referring MD: Hulan Fess, MD   Chief Complaint  Patient presents with  . Atrial Fibrillation  . Congestive Heart Failure    History of Present Illness:    Rodney Burns is a 74 y.o. male with a hx of atrial flutter, CAD, chronic systolic heart failure, CLL, hypertension, hyperlipidemia, TAA, and history of pericarditis in January 2017.  Patient has a history of CAD s/p CABG in 1994.  He had radiofrequency ablation in 2013.  Unfortunately he had recurrent atrial flutter after the ablation.  Myoview in March 2016 showed inferior and anteroapical infarct without evidence of ischemia, EF 32%.  Echocardiogram obtained during diagnosis of pericarditis in January 2017 was unremarkable.  He was treated with ibuprofen and colchicine at the time and has not had any recurrent chest discomfort.  He was last seen by Dr. Martinique in September 2019, at which time he gained 3 pounds.  He was instructed to take Lasix 20 mg daily for 4 days.  Repeat echocardiogram obtained on 10/21/2017 showed EF 40 to 45%, hypokinesis of the anteroseptal and inferoseptal myocardium, grade 1 DD, mild MR, 40 mm aortic root dilatation, PA peak pressure 34 mmHg.   More recently, patient was admitted in early December 2020 with acute hypoxic respiratory failure.  He required 3 to 4 L of oxygen in the hospital and had a significant transaminitis.  He was tested positive for COVID-19 and was treated with steroid and convalescent plasma.  Due to severe transaminitis, he was unable to get remdesivir or Actemra.  During the hospitalization, heart rate was elevated, Toprol-XL was increased and he was given IV digoxin.  He was placed on oral digoxin prior to discharge. Follow up Dig level was 0.5.   When last seen he was still having difficulty breathing and  remained on oxygen 2 l [place]. CT in January showed pulmonary fibrosis. Since then he has been weaned off oxygen. He completed pulmonary Rehab. Had a sleep study and is on CPAP. He reports his breathing is back about 75%. He has a little edema if on his feet a lot. No chest pain or palpitations. Is a little more active.    Past Medical History:  Diagnosis Date  . Atrial tachycardia ([place])    a. s/p DCCV 09/2009; b. s/p RFCA 03/2011; c. s/p DCCV 04/2011; d. s/p RFCA 09/17/11.  . Cataract   . Chronic systolic CHF (congestive heart failure) ([place])    a. 01/2015 Echo: EF 40-45%, antsept/infsept HK, triv AI, mild MR, mod dil LA, nl RV, mildly dil RA.  [name] CLL (chronic lymphoblastic leukemia)    Stage 0-1  . Coronary artery disease involving native coronary artery without angina pectoris    a. Status post CABG in 1994:By Dr. Cyndia Bent. LIMA - L Cx, seqSVG-Diag-LAD, and SVG-PDA-PL.  [name] Cough    Consistant with ACE inhibitor mediated cough  . Dysrhythmia   . Glaucoma (increased eye pressure) 1991  . History of tobacco abuse    quit 1994  . Hypercholesterolemia    Well Controlled  . Hypertension   . Ischemic cardiomyopathy    a. 02/2011 Echo: EF 40-45%;  b. 01/2015 Echo: EF 40-45%.  . Malaria 1972   Hx of  . Pericarditis    a. 01/2015-->Treated w/ ibuprofen/colchicine.  [name]  Sleep-disordered breathing 06/20/2011    Past Surgical History:  Procedure Laterality Date  . ATRIAL FLUTTER ABLATION N/A 04/11/2011   Procedure: ATRIAL FLUTTER ABLATION;  Surgeon: Deboraha Sprang, MD;  Location: [facility] CATH LAB;  Service: Cardiovascular;  Laterality: N/A;  . ATRIAL FLUTTER ABLATION N/A 09/17/2011   Procedure: ATRIAL FLUTTER ABLATION;  Surgeon: Thompson Grayer, MD;  Location[facility] C[facility] LAB;  Service: Cardiovascular;  Laterality: N/A;  . CARDIOVERSION  06/03/2011   Procedure: CARDIOVERSION;  Surgeon: Deboraha Sprang, MD;  Location: [place];  Service: Cardiovascular;  Laterality: N/A;  . CATARACT EXTRACTION    . CORONARY ARTERY BYPASS  GRAFT  1994   By Dr. Cyndia Bent. LIMA - L Cx, seqSVG-Diag-LAD, and SVG-PDA-PL  . ELECTROPHYSIOLOGY STUDY N/A 04/11/2011   Procedure: ELECTROPHYSIOLOGY STUDY;  Surgeon: Deboraha Sprang, MD;  Location[facility] CATH LAB;  Service: Cardiovascular;  Laterality: N/A;  . EP study and ablation  2013   by Dr Caryl Comes, repeat ablation by Dr Rayann Heman  . NM MYOVIEW LTD  03/2014   scar in the inferior and anteroapical regions without ischemia. This is similar to finding on Myoview in 2013. EF is a little lower 39%>>32%.  [name] TRIGGER FINGER RELEASE  12/2016   left hand   . US ECHOCARDIOGRAPHY  09-07-09; 02/2011   a. Est EF 40-45%; b. EF 40-45%. Gr 2 DD. Mild LA dil    Current Medications: Current Meds  Medication Sig  . albuterol (VENTOLIN HFA) 108 (90 Base) MCG/ACT inhaler Inhale 1 puff into the lungs every 6 (six) hours as needed for wheezing or shortness of breath.  . digoxin (LANOXIN) 0.125 MG tablet Take 1 tablet (0.125 mg total) by mouth daily.  [name] Calcium (STOOL SOFTENER PO) Take by mouth 2 (two) times daily.  [name] ezetimibe-simvastatin (VYTORIN) 10-20 MG tablet TAKE 1 TABLET AT BEDTIME (KEEP UPCOMING APPOINTMENT FOR REFILLS)  . Fiber, Guar Gum, CHEW Chew 3 capsules by mouth daily.   [name] latanoprost (XALATAN) 0.005 % ophthalmic solution Place 1 drop into both eyes at bedtime.   . Magnesium 250 MG TABS Take 500 mg by mouth at bedtime.  . metoprolol succinate (TOPROL-XL) 50 MG 24 hr tablet Take 1 tablet (50 mg) in the mornings and half tablet (25 mg) in the evenings.  . pramipexole (MIRAPEX) 1.5 MG tablet Take 1.5 mg by mouth daily.  . predniSONE (DELTASONE) 5 MG tablet Take 10mg  daily  . RABEprazole (ACIPHEX) 20 MG tablet Take 1 tablet (20 mg total) by mouth daily.  . sacubitril-valsartan (ENTRESTO) 49-51 MG Take 1 tablet by mouth 2 (two) times daily.  [name] spironolactone (ALDACTONE) 25 MG tablet Take 1 tablet (25 mg total) by mouth daily. Take 1/2 tablet ( 12.5 mg ) daily  . traMADol (ULTRAM) 50 MG tablet TAKE 1  TABLET EVERY 6 HOURS AS NEEDED FOR MODERATE PAIN  . umeclidinium-vilanterol (ANORO ELLIPTA) 62.5-25 MCG/INH AEPB Inhale 1 puff into the lungs daily.  [name] 20 MG TABS tablet TAKE 1 TABLET DAILY WITH SUPPER  . [DISCONTINUED] pramipexole (MIRAPEX) 0.5 MG tablet Take 1.5 mg by mouth daily.   . [DISCONTINUED] spironolactone (ALDACTONE) 25 MG tablet Take 1/2 tablet ( 12.5 mg ) daily     Allergies:   Penicillins, Ace inhibitors, Darvocet [propoxyphene n-acetaminophen], and Sulfa antibiotics   Social History   Socioeconomic History  . Marital status: Married    Spouse name: Not on file  . Number of children: 2  . Years of education: Not on file  .  Highest education level: Not on file  Occupational History  . Occupation: Careers adviser: RETIRED  Tobacco Use  . Smoking status: Former Smoker    Packs/day: 2.00    Years: 30.00    Pack years: 60.00    Types: Cigarettes    Quit date: 01/22/1992    Years since quitting: 27.5  . Smokeless tobacco: Never Used  Vaping Use  . Vaping Use: Never used  Substance and Sexual Activity  . Alcohol use: No  . Drug use: No  . Sexual activity: Yes  Other Topics Concern  . Not on file  Social History Narrative  . Not on file   Social Determinants of Health   Financial Resource Strain:   . Difficulty of Paying Living Expenses:   Food Insecurity:   . Worried About Charity fundraiser in the Last Year:   . Arboriculturist in the Last Year:   Transportation Needs:   . Film/video editor (Medical):   [name] Lack of Transportation (Non-Medical):   Physical Activity:   . Days of Exercise per Week:   . Minutes of Exercise per Session:   Stress:   . Feeling of Stress :   Social Connections:   . Frequency of Communication with Friends and Family:   . Frequency of Social Gatherings with Friends and Family:   . Attends Religious Services:   . Active Member of Clubs or Organizations:   . Attends Archivist Meetings:   [name]  Marital Status:      Family History: The patient's family history includes Cancer in his brother; Down syndrome in his son; Heart failure in his father.  ROS:   Please see the history of present illness.     All other systems reviewed and are negative.  EKGs/Labs/Other Studies Reviewed:    The following studies were reviewed today:  Echo 10/21/2017 Study Conclusions  - Left ventricle: The cavity size was normal. Wall thickness was   increased in a pattern of mild LVH. Systolic function was mildly   to moderately reduced. The estimated ejection fraction was in the range of 40% to 45%. Diffuse hypokinesis. There is hypokinesis of the anteroseptal and inferoseptal myocardium. Doppler parameters are consistent with abnormal left ventricular relaxation (grade 1 diastolic dysfunction). - Ventricular septum: The contour showed diastolic flattening. - Aortic valve: Trileaflet; mildly thickened, mildly calcified   leaflets. - Aorta: Aortic root dimension: 40 mm (ED). - Ascending aorta: The ascending aorta was mildly dilated. - Mitral valve: There was mild regurgitation directed eccentrically   and posteriorly. - Pulmonary arteries: Systolic pressure was mildly increased. PA   peak pressure: 34 mm Hg (S).  Impressions:  - Compared to the prior study, there has been no significant   interval change.  Echo 04/14/19: Study Conclusions   - Left ventricle: The cavity size was normal. Wall thickness was  increased in a pattern of mild LVH. Systolic function was mildly  to moderately reduced. The estimated ejection fraction was in the  range of 40% to 45%. Diffuse hypokinesis. There is hypokinesis of  the anteroseptal and inferoseptal myocardium. Doppler parameters  are consistent with abnormal left ventricular relaxation (grade 1  diastolic dysfunction).  - Ventricular septum: The contour showed diastolic flattening.  - Aortic valve: Trileaflet; mildly thickened, mildly  calcified  leaflets.  - Aorta: Aortic root dimension: 40 mm (ED).  - Ascending aorta: The ascending aorta was mildly dilated.  - Mitral valve: There was  mild regurgitation directed eccentrically  and posteriorly.  - Pulmonary arteries: Systolic pressure was mildly increased. PA  peak pressure: 34 mm Hg (S).   Impressions:   - Compared to the prior study, there has been no significant  interval change.    EKG:  EKG is not ordered today.    Recent Labs: 12/29/2018: B Natriuretic Peptide 208.8; Magnesium 2.4 01/18/2019: ALT 67; BUN 13; Creatinine 0.77; Hemoglobin 13.2; Platelet Count 166; Potassium 4.0; Sodium 137  Recent Lipid Panel    Component Value Date/Time   CHOL 108 10/16/2017 1435   TRIG 93 12/24/2018 1134   HDL 42 10/16/2017 1435   LDLCALC 55 10/16/2017 1435   Dated 05/22/18: cholesterol 115, triglycerides 137, HDL 45, LDL 59 Dated 04/01/19: cholesterol 119, triglycerides 74, HDL 40, LDL 63. A1c 7.6%. Dig level 0.5. CBC and CMET normal.    Physical Exam:    VS:  BP 106/62   Pulse 66   Ht 5\' 10"  (1.778 m)   Wt 206 lb 9.6 oz (93.7 kg)   SpO2 95%   BMI 29.64 kg/m     Wt Readings from Last 3 Encounters:  07/20/19 206 lb 9.6 oz (93.7 kg)  05/25/19 210 lb 15.7 oz (95.7 kg)  05/11/19 216 lb 4.3 oz (98.1 kg)     GEN:  Well nourished, well developed in no acute distress HEENT: Normal NECK: No JVD; No carotid bruits LYMPHATICS: No lymphadenopathy CARDIAC: tachycardic, no murmurs, rubs, gallops RESPIRATORY:  clear ABDOMEN: Soft, non-tender, non-distended MUSCULOSKELETAL:  No edema; No deformity  SKIN: Warm and dry NEUROLOGIC:  Alert and oriented x 3 PSYCHIATRIC:  Normal affect   ASSESSMENT:    1. Chronic systolic CHF (congestive heart failure) ([place])   2. Atrial flutter, unspecified type ([place])   3. Coronary artery disease involving coronary bypass graft of native heart without angina pectoris   4. Essential hypertension   5. Hyperlipidemia LDL goal <70     PLAN:    In order of problems listed above:  1. Paroxysmal atrial flutter: he is maintaining NSR.  HR well controlled on metoprolol and digoxin. Will continue current rate control and Xarelto. Dig level OK.  2. PNA/pulmonary fibrosis: Related to Covid infection. Off oxygen. Pulmonary follow up   3. CAD s/p CABG: asymptomatic.   4. Chronic systolic heart failure: On Toprol-XL and  Entresto. Will increase aldactone to 25 mg daily. Appears euvolemic and weight is down.   5. Hypertension: Blood pressure stable  6. Hyperlipidemia: On Vytorin.   Follow up 6 months.   Medication Adjustments/Labs and Tests Ordered: Current medicines are reviewed at length with the patient today.  Concerns regarding medicines are outlined above.  No orders of the defined types were placed in this encounter.  Meds ordered this encounter  Medications  . spironolactone (ALDACTONE) 25 MG tablet    Sig: Take 1 tablet (25 mg total) by mouth daily. Take 1/2 tablet ( 12.5 mg ) daily    Dispense:  90 tablet    Refill:  3    There are no Patient Instructions on file for this visit.   Signed, Shelia Kingsberry Martinique, MD  07/20/2019 3:28 PM    [facility]

## 2019-07-20 ENCOUNTER — Other Ambulatory Visit: Payer: Self-pay

## 2019-07-20 ENCOUNTER — Encounter: Payer: Self-pay | Admitting: Cardiology

## 2019-07-20 ENCOUNTER — Ambulatory Visit (INDEPENDENT_AMBULATORY_CARE_PROVIDER_SITE_OTHER): Payer: Medicare Other | Admitting: Cardiology

## 2019-07-20 VITALS — BP 106/62 | HR 66 | Ht 70.0 in | Wt 206.6 lb

## 2019-07-20 DIAGNOSIS — I4892 Unspecified atrial flutter: Secondary | ICD-10-CM | POA: Diagnosis not present

## 2019-07-20 DIAGNOSIS — I10 Essential (primary) hypertension: Secondary | ICD-10-CM

## 2019-07-20 DIAGNOSIS — I25810 Atherosclerosis of coronary artery bypass graft(s) without angina pectoris: Secondary | ICD-10-CM

## 2019-07-20 DIAGNOSIS — E785 Hyperlipidemia, unspecified: Secondary | ICD-10-CM

## 2019-07-20 DIAGNOSIS — I5022 Chronic systolic (congestive) heart failure: Secondary | ICD-10-CM | POA: Diagnosis not present

## 2019-07-20 MED ORDER — SPIRONOLACTONE 25 MG PO TABS: 25.0000 mg | ORAL_TABLET | Freq: Every day | ORAL | 3 refills | Status: DC

## 2019-08-05 ENCOUNTER — Other Ambulatory Visit: Payer: Self-pay

## 2019-08-05 ENCOUNTER — Telehealth: Payer: Self-pay | Admitting: Pulmonary Disease

## 2019-08-05 ENCOUNTER — Encounter: Payer: Self-pay | Admitting: Pulmonary Disease

## 2019-08-05 ENCOUNTER — Ambulatory Visit (INDEPENDENT_AMBULATORY_CARE_PROVIDER_SITE_OTHER): Payer: Medicare Other | Admitting: Pulmonary Disease

## 2019-08-05 VITALS — BP 118/62 | HR 53 | Temp 98.1°F | Ht 70.0 in | Wt 207.2 lb

## 2019-08-05 DIAGNOSIS — I25810 Atherosclerosis of coronary artery bypass graft(s) without angina pectoris: Secondary | ICD-10-CM

## 2019-08-05 DIAGNOSIS — R0602 Shortness of breath: Secondary | ICD-10-CM | POA: Diagnosis not present

## 2019-08-05 DIAGNOSIS — G4733 Obstructive sleep apnea (adult) (pediatric): Secondary | ICD-10-CM

## 2019-08-05 NOTE — Patient Instructions (Addendum)
We will call DME company to ensure that his CPAP setting is 14 cmH2O We will also check with them to get a filter until the CPAP can be replaced  Order high-res CT and PFTs in 3 months Follow-up in 3 months.

## 2019-08-05 NOTE — Telephone Encounter (Signed)
Spoke with the pt  He is asking about when CT needs to be done  He thinks Dr [name] told him due now but the AVS states 3 months  Please advise thanks

## 2019-08-05 NOTE — Progress Notes (Addendum)
Rodney Burns    [id number]    06/05/1945  Primary Care Physician:Little, Lennette Bihari, MD  Referring Physician: Hulan Fess, MD [place],  [place] [id number]  Chief complaint: Follow-up for post COVID-19 pulmonary fibrosis, OSA  HPI: 74 y.o.malewith PMHx ofCLL, chronic atrial fibrillation-status post ablation-on Xarelto, history of CAD-s/p CABG 6468, chronic systolic heart failure, HTN, HLD.    Specialized from 12/3 to 12/30/2018 with COVID-19 pneumonia.  He was treated with steroids and convalescent plasma.  He did not get remdesivir or Actemra due to severe transaminitis.  Discharged on 2 L oxygen Continues to have significant dyspnea on exertion.  Follow-up chest x-ray 12/28 with worsening bilateral infiltrates and was referred to pulmonary for further evaluation.  He has history of CLL, currently on monitoring under the care of Dr. 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  No mold, hot tub, Jacuzzi.  No feather pillows or comforters  Smoking history: 60-pack-year smoker.  Quit in 1994 Travel history: No significant recent travel history Relevant family history: No significant family history of lung disease  Interim history: Started on CPAP after sleep study, titrated to 14 cm of water  He is having issues getting his CPAP replaced due to recall.  Feels that he is not getting adequate pressure on the machine  Outpatient Encounter Medications as of 08/05/2019  Medication Sig  . albuterol (VENTOLIN HFA) 108 (90 Base) MCG/ACT inhaler Inhale 1 puff into the lungs every 6 (six) hours as needed for wheezing or shortness of breath.  . digoxin (LANOXIN) 0.125 MG tablet Take 1 tablet (0.125 mg total) by mouth daily.  [name] Calcium (STOOL SOFTENER PO) Take by mouth 2 (two) times daily.  [name] ezetimibe-simvastatin (VYTORIN) 10-20 MG tablet TAKE 1 TABLET AT BEDTIME (KEEP UPCOMING  APPOINTMENT FOR REFILLS)  . Fiber, Guar Gum, CHEW Chew 3 capsules by mouth daily.   [name] latanoprost (XALATAN) 0.005 % ophthalmic solution Place 1 drop into both eyes at bedtime.   . Magnesium 250 MG TABS Take 500 mg by mouth at bedtime.  . metoprolol succinate (TOPROL-XL) 50 MG 24 hr tablet Take 1 tablet (50 mg) in the mornings and half tablet (25 mg) in the evenings.  . pramipexole (MIRAPEX) 1.5 MG tablet Take 1.5 mg by mouth daily.  . RABEprazole (ACIPHEX) 20 MG tablet Take 1 tablet (20 mg total) by mouth daily.  . sacubitril-valsartan (ENTRESTO) 49-51 MG Take 1 tablet by mouth 2 (two) times daily.  [name] spironolactone (ALDACTONE) 25 MG tablet Take 1 tablet (25 mg total) by mouth daily. Take 1/2 tablet ( 12.5 mg ) daily  . umeclidinium-vilanterol (ANORO ELLIPTA) 62.5-25 MCG/INH AEPB Inhale 1 puff into the lungs daily.  [name] 20 MG TABS tablet TAKE 1 TABLET DAILY WITH SUPPER  . [DISCONTINUED] predniSONE (DELTASONE) 5 MG tablet Take 10mg  daily  . [DISCONTINUED] traMADol (ULTRAM) 50 MG tablet TAKE 1 TABLET EVERY 6 HOURS AS NEEDED FOR MODERATE PAIN   No facility-administered encounter medications on file as of 08/05/2019.   Physical Exam: Blood pressure 112/62, pulse 82, temperature 97.7 F (36.5 C), temperature source Temporal, height 5\' 10"  (1.778 m), weight 199 lb 3.2 oz (90.4 kg), SpO2 93 %. Gen:      No acute distress HEENT:  EOMI, sclera anicteric Neck:     No mass es; no thyromegaly Lungs:    Faint basilar crackles CV:  Regular rate and rhythm; no murmurs Abd:      + bowel sounds; soft, non-tender; no palpable masses, no distension Ext:    No edema; adequate peripheral perfusion Skin:      Warm and dry; no rash Neuro: alert and oriented x 3 Psych: normal mood and affect  Data Reviewed: Imaging: Chest x-ray 12/26/2018-bilateral interstitial prominence. Chest x-ray 01/18/2019-progression of reticulonodular opacities. High-resolution CT 02/03/2019-moderate pulmonary fibrosis  with basal pattern with septal thickening, mild traction bronchiectasis.  No honeycombing.  Probable UIP I have reviewed the images personally.  PFTs: 03/29/2019 FVC 3.32 [34%], FEV1 2.59 [80%],/78, TLC 5.78 [79%], DLCO 14.25 [54%] Minimal restriction with moderate diffusion defect.  mMRC  02/10/2019- 3 03/16/2019-3  Sleep: PSG 08/03/2011 Very mild OSA with AHI 9, desats to 86%, significant leg movements suspicious for PLM  Overnight oximetry 02/16/2019 Time less than 88% - 15 minutes, 32 seconds Nadir O2 sat of 83%. Oxygen desaturation index 27.5  CPAP titration 05/05/2019 Trial CPAP therapy on 14 cmH2O. 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 We will need his device changed due to recall of [employer]ips machine however there are no supplies for 2 months.  Will check with DME company about getting obtaining a filter  Ex-smoker Continue Anoro  Plan/Recommendations: - Anoro inhaler - Continue CPAP, 14 cmH2O  Marshell Garfinkel MD [facility] 08/05/2019, 9:48 AM  CC: Hulan Fess, MD  Addendum CPAP download 10/17/2019 100% usage with average of 6 hours per night Auto CPAP, mean pressure 7.7, residual AHI 15.9  Erico Stan MD [facility] Please see [email or web address] for pager details.  11/17/2019, 4:31 PM

## 2019-08-06 NOTE — Telephone Encounter (Signed)
Called and spoke with pt letting him know the info stated by Dr. Mannam and he verbalized understanding. Nothing further needed. 

## 2019-08-06 NOTE — Telephone Encounter (Signed)
Yes. HRCT at next available and PFTs 3 months from now.

## 2019-08-20 ENCOUNTER — Other Ambulatory Visit: Payer: Medicare Other

## 2019-08-24 ENCOUNTER — Other Ambulatory Visit: Payer: Self-pay | Admitting: *Deleted

## 2019-08-24 ENCOUNTER — Telehealth: Payer: Self-pay | Admitting: *Deleted

## 2019-08-24 DIAGNOSIS — C9110 Chronic lymphocytic leukemia of B-cell type not having achieved remission: Secondary | ICD-10-CM

## 2019-08-24 DIAGNOSIS — R739 Hyperglycemia, unspecified: Secondary | ICD-10-CM

## 2019-08-24 NOTE — Telephone Encounter (Signed)
Pt's wife called & states that pt & she have appts coming up with Dr [name] but she wants to know if DR [name] needs to do an A1C test since pt has been off prednisone for a while but was on it for 4 & 1/2 mo & A1C was up.  Informed that CMET is ordered but would ask if he needs A1C.  Pt is not diabetic.  Message to Dr [name].

## 2019-08-26 NOTE — Progress Notes (Signed)
Patient Care Team: Hulan Fess, MD as PCP - General (Family Medicine) Martinique, [name], MD as PCP - Cardiology (Cardiology)  DIAGNOSIS:    ICD-10-CM   1. CLL (chronic lymphocytic leukemia) (HCC)  C91.10     CHIEF COMPLIANT: Follow-up of CLL  INTERVAL HISTORY: Rodney Burns is a 74 y.o. with above-mentioned history of CLL who is currently on surveillance. He was admitted for a COVID-19 infection in December 2020. He presents to the clinic today for annual follow-up to review labs.   He was diagnosed with COVID-19 in December 2020 and he still has some trouble with breathing issues.  He thinks he is about 75% better.  Because of extensive steroid usage he had steroid-induced elevation of blood sugars.  He has been off steroids for the past 3 months and today hemoglobin A1c has been drawn.  ALLERGIES:  is allergic to penicillins, ace inhibitors, darvocet [propoxyphene n-acetaminophen], and sulfa antibiotics.  MEDICATIONS:  Current Outpatient Medications  Medication Sig Dispense Refill  . albuterol (VENTOLIN HFA) 108 (90 Base) MCG/ACT inhaler Inhale 1 puff into the lungs every 6 (six) hours as needed for wheezing or shortness of breath.    . digoxin (LANOXIN) 0.125 MG tablet Take 1 tablet (0.125 mg total) by mouth daily. 90 tablet 3  . Docusate Calcium (STOOL SOFTENER PO) Take by mouth 2 (two) times daily.    [name] ezetimibe-simvastatin (VYTORIN) 10-20 MG tablet TAKE 1 TABLET AT BEDTIME (KEEP UPCOMING APPOINTMENT FOR REFILLS) 90 tablet 3  . Fiber, Guar Gum, CHEW Chew 3 capsules by mouth daily.     [name] latanoprost (XALATAN) 0.005 % ophthalmic solution Place 1 drop into both eyes at bedtime.     . Magnesium 250 MG TABS Take 500 mg by mouth at bedtime.    . metoprolol succinate (TOPROL-XL) 50 MG 24 hr tablet Take 1 tablet (50 mg) in the mornings and half tablet (25 mg) in the evenings. 135 tablet 3  . pramipexole (MIRAPEX) 1.5 MG tablet Take 1.5 mg by mouth daily.    . RABEprazole (ACIPHEX) 20 MG  tablet Take 1 tablet (20 mg total) by mouth daily. 90 tablet 3  . sacubitril-valsartan (ENTRESTO) 49-51 MG Take 1 tablet by mouth 2 (two) times daily. 180 tablet 3  . spironolactone (ALDACTONE) 25 MG tablet Take 1 tablet (25 mg total) by mouth daily. Take 1/2 tablet ( 12.5 mg ) daily 90 tablet 3  . umeclidinium-vilanterol (ANORO ELLIPTA) 62.5-25 MCG/INH AEPB Inhale 1 puff into the lungs daily. 180 each 3  . XARELTO 20 MG TABS tablet TAKE 1 TABLET DAILY WITH SUPPER 90 tablet 3   No current facility-administered medications for this visit.    PHYSICAL EXAMINATION: ECOG PERFORMANCE STATUS: 1 - Symptomatic but completely ambulatory  There were no vitals filed for this visit. There were no vitals filed for this visit.  LABORATORY DATA:  I have reviewed the data as listed CMP Latest Ref Rng & Units 01/18/2019 12/30/2018 12/29/2018  Glucose 70 - 99 mg/dL 145(H) 81 92  BUN 8 - 23 mg/dL 13 30(H) 33(H)  Creatinine 0.61 - 1.24 mg/dL 0.77 0.87 0.87  Sodium 135 - 145 mmol/L 137 136 141  Potassium 3.5 - 5.1 mmol/L 4.0 4.9 4.9  Chloride 98 - 111 mmol/L 105 101 103  CO2 22 - 32 mmol/L 23 26 27   Calcium 8.9 - 10.3 mg/dL 8.5(L) 8.5(L) 8.8(L)  Total Protein 6.5 - 8.1 g/dL 6.2(L) 6.1(L) 6.2(L)  Total Bilirubin 0.3 - 1.2 mg/dL 0.7  1.3(H) 1.6(H)  Alkaline Phos 38 - 126 U/L 52 45 46  AST 15 - 41 U/L 22 69(H) 68(H)  ALT 0 - 44 U/L 67(H) 216(H) 231(H)    Lab Results  Component Value Date   WBC 11.0 (H) 08/27/2019   HGB 12.3 (L) 08/27/2019   HCT 41.6 08/27/2019   MCV 82.2 08/27/2019   PLT 106 (L) 08/27/2019   NEUTROABS 2.9 08/27/2019    ASSESSMENT & PLAN:  CLL (chronic lymphocytic leukemia) (HCC) CLL stage I, lymphocytosis and lymphadenopathy diagnosed 07/31/2009, initial CT scan revealed lymphadenopathy and mildly enlarged spleen.  Lab review:  Patient had COVID-19 pneumonia   Treatment plan: Observation. Interestingly his white count and lymphocyte count have decreased since the COVID-19  infection.  I discussed with him the indications for treatment would be rapid doubling time on development of severe anemia thrombocytopenia, rapidly enlarging lymphadenopathy, B symptoms like fevers chills night sweats and weight loss.  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  Return to clinic in1 yearfor follow-up with blood tests.    No orders of the defined types were placed in this encounter.  The patient has a good understanding of the overall plan. he agrees with it. he will call with any problems that may develop before the next visit here.  Total time spent: 20 mins including face to face time and time spent for planning, charting and coordination of care  Nicholas Lose, MD 08/27/2019  I, Cloyde Reams Dorshimer, am acting as scribe for Dr. Nicholas Lose.  I have reviewed the above documentation for accuracy and completeness, and I agree with the above.

## 2019-08-27 ENCOUNTER — Telehealth: Payer: Self-pay | Admitting: Hematology and Oncology

## 2019-08-27 ENCOUNTER — Other Ambulatory Visit: Payer: Self-pay

## 2019-08-27 ENCOUNTER — Inpatient Hospital Stay: Payer: Medicare Other

## 2019-08-27 ENCOUNTER — Inpatient Hospital Stay: Payer: Medicare Other | Attending: Hematology and Oncology | Admitting: Hematology and Oncology

## 2019-08-27 DIAGNOSIS — C9111 Chronic lymphocytic leukemia of B-cell type in remission: Secondary | ICD-10-CM | POA: Diagnosis not present

## 2019-08-27 DIAGNOSIS — Z8701 Personal history of pneumonia (recurrent): Secondary | ICD-10-CM | POA: Diagnosis not present

## 2019-08-27 DIAGNOSIS — C9110 Chronic lymphocytic leukemia of B-cell type not having achieved remission: Secondary | ICD-10-CM

## 2019-08-27 DIAGNOSIS — Z7901 Long term (current) use of anticoagulants: Secondary | ICD-10-CM | POA: Diagnosis not present

## 2019-08-27 DIAGNOSIS — I25810 Atherosclerosis of coronary artery bypass graft(s) without angina pectoris: Secondary | ICD-10-CM | POA: Diagnosis not present

## 2019-08-27 DIAGNOSIS — Z8616 Personal history of COVID-19: Secondary | ICD-10-CM | POA: Diagnosis not present

## 2019-08-27 DIAGNOSIS — D696 Thrombocytopenia, unspecified: Secondary | ICD-10-CM | POA: Insufficient documentation

## 2019-08-27 DIAGNOSIS — Z79899 Other long term (current) drug therapy: Secondary | ICD-10-CM | POA: Insufficient documentation

## 2019-08-27 DIAGNOSIS — R739 Hyperglycemia, unspecified: Secondary | ICD-10-CM

## 2019-08-27 LAB — CMP (CANCER CENTER ONLY)
ALT: 13 U/L (ref 0–44)
AST: 15 U/L (ref 15–41)
Albumin: 4.1 g/dL (ref 3.5–5.0)
Alkaline Phosphatase: 35 U/L — ABNORMAL LOW (ref 38–126)
Anion gap: 7 (ref 5–15)
BUN: 15 mg/dL (ref 8–23)
CO2: 25 mmol/L (ref 22–32)
Calcium: 9.6 mg/dL (ref 8.9–10.3)
Chloride: 108 mmol/L (ref 98–111)
Creatinine: 0.9 mg/dL (ref 0.61–1.24)
GFR, Est AFR Am: >60 mL/min (ref >60)
GFR, Estimated: >60 mL/min (ref >60)
Glucose, Bld: 104 mg/dL — ABNORMAL HIGH (ref 70–99)
Potassium: 4.2 mmol/L (ref 3.5–5.1)
Sodium: 140 mmol/L (ref 135–145)
Total Bilirubin: 0.9 mg/dL (ref 0.3–1.2)
Total Protein: 6.6 g/dL (ref 6.5–8.1)

## 2019-08-27 LAB — CBC WITH DIFFERENTIAL (CANCER CENTER ONLY)
Abs Immature Granulocytes: 0.02 10*3/uL (ref 0.00–0.07)
Basophils Absolute: 0 10*3/uL (ref 0.0–0.1)
Basophils Relative: 0 %
Eosinophils Absolute: 0.1 10*3/uL (ref 0.0–0.5)
Eosinophils Relative: 1 %
HCT: 41.6 % (ref 39.0–52.0)
Hemoglobin: 12.3 g/dL — ABNORMAL LOW (ref 13.0–17.0)
Immature Granulocytes: 0 %
Lymphocytes Relative: 63 %
Lymphs Abs: 7 10*3/uL — ABNORMAL HIGH (ref 0.7–4.0)
MCH: 24.3 pg — ABNORMAL LOW (ref 26.0–34.0)
MCHC: 29.6 g/dL — ABNORMAL LOW (ref 30.0–36.0)
MCV: 82.2 fL (ref 80.0–100.0)
Monocytes Absolute: 1 10*3/uL (ref 0.1–1.0)
Monocytes Relative: 10 %
Neutro Abs: 2.9 10*3/uL (ref 1.7–7.7)
Neutrophils Relative %: 26 %
Platelet Count: 106 10*3/uL — ABNORMAL LOW (ref 150–400)
RBC: 5.06 MIL/uL (ref 4.22–5.81)
RDW: 17.1 % — ABNORMAL HIGH (ref 11.5–15.5)
WBC Count: 11 10*3/uL — ABNORMAL HIGH (ref 4.0–10.5)
nRBC: 0 % (ref 0.0–0.2)

## 2019-08-27 LAB — HEMOGLOBIN A1C
Hgb A1c MFr Bld: 6.5 % — ABNORMAL HIGH (ref 4.8–5.6)
Mean Plasma Glucose: 139.85 mg/dL

## 2019-08-27 NOTE — Telephone Encounter (Signed)
Scheduled appts per 8/6 los. Gave pt a print out of AVS.  

## 2019-08-27 NOTE — Assessment & Plan Note (Signed)
CLL stage I, lymphocytosis and lymphadenopathy diagnosed 07/31/2009, initial CT scan revealed lymphadenopathy and mildly enlarged spleen.  Lab review:  Patient had COVID-19 pneumonia   Treatment plan: Observation. I discussed with him the indications for treatment would be rapid doubling time on development of severe anemia thrombocytopenia, rapidly enlarging lymphadenopathy, B symptoms like fevers chills night sweats and weight loss.  Chronic thrombocytopenia: Probably low-grade ITP.His platelet count today is 139which is stable.  Return to clinic in1 yearfor follow-up with blood tests.

## 2019-09-07 DIAGNOSIS — Z961 Presence of intraocular lens: Secondary | ICD-10-CM | POA: Diagnosis not present

## 2019-09-07 DIAGNOSIS — H26493 Other secondary cataract, bilateral: Secondary | ICD-10-CM | POA: Diagnosis not present

## 2019-09-07 DIAGNOSIS — H401131 Primary open-angle glaucoma, bilateral, mild stage: Secondary | ICD-10-CM | POA: Diagnosis not present

## 2019-09-16 ENCOUNTER — Encounter: Payer: Self-pay | Admitting: Hematology and Oncology

## 2019-10-19 ENCOUNTER — Encounter: Payer: Self-pay | Admitting: Pulmonary Disease

## 2019-10-19 ENCOUNTER — Ambulatory Visit (INDEPENDENT_AMBULATORY_CARE_PROVIDER_SITE_OTHER): Payer: Medicare Other | Admitting: Pulmonary Disease

## 2019-10-19 ENCOUNTER — Other Ambulatory Visit: Payer: Self-pay

## 2019-10-19 ENCOUNTER — Ambulatory Visit: Payer: Medicare Other | Admitting: Pulmonary Disease

## 2019-10-19 VITALS — BP 124/72 | HR 52 | Temp 96.4°F | Ht 72.0 in | Wt 200.6 lb

## 2019-10-19 DIAGNOSIS — U071 COVID-19: Secondary | ICD-10-CM

## 2019-10-19 DIAGNOSIS — I5022 Chronic systolic (congestive) heart failure: Secondary | ICD-10-CM

## 2019-10-19 DIAGNOSIS — Z0000 Encounter for general adult medical examination without abnormal findings: Secondary | ICD-10-CM

## 2019-10-19 DIAGNOSIS — R0602 Shortness of breath: Secondary | ICD-10-CM | POA: Diagnosis not present

## 2019-10-19 DIAGNOSIS — G4733 Obstructive sleep apnea (adult) (pediatric): Secondary | ICD-10-CM | POA: Diagnosis not present

## 2019-10-19 DIAGNOSIS — R0609 Other forms of dyspnea: Secondary | ICD-10-CM | POA: Insufficient documentation

## 2019-10-19 DIAGNOSIS — I25810 Atherosclerosis of coronary artery bypass graft(s) without angina pectoris: Secondary | ICD-10-CM

## 2019-10-19 DIAGNOSIS — C9110 Chronic lymphocytic leukemia of B-cell type not having achieved remission: Secondary | ICD-10-CM

## 2019-10-19 DIAGNOSIS — J1282 Pneumonia due to coronavirus disease 2019: Secondary | ICD-10-CM

## 2019-10-19 LAB — PULMONARY FUNCTION TEST
DL/VA % pred: 62 %
DL/VA: 2.47 ml/min/mmHg/L
DLCO cor % pred: 55 %
DLCO cor: 14.26 ml/min/mmHg
DLCO unc % pred: 55 %
DLCO unc: 14.26 ml/min/mmHg
FEF 25-75 Post: 2.54 L/sec
FEF 25-75 Pre: 2.69 L/sec
FEF2575-%Change-Post: -5 %
FEF2575-%Pred-Post: 109 %
FEF2575-%Pred-Pre: 115 %
FEV1-%Change-Post: 0 %
FEV1-%Pred-Post: 96 %
FEV1-%Pred-Pre: 96 %
FEV1-Post: 3.08 L
FEV1-Pre: 3.07 L
FEV1FVC-%Change-Post: 0 %
FEV1FVC-%Pred-Pre: 106 %
FEV6-%Change-Post: 0 %
FEV6-%Pred-Post: 96 %
FEV6-%Pred-Pre: 96 %
FEV6-Post: 4 L
FEV6-Pre: 3.97 L
FEV6FVC-%Change-Post: 0 %
FEV6FVC-%Pred-Post: 106 %
FEV6FVC-%Pred-Pre: 106 %
FVC-%Change-Post: 0 %
FVC-%Pred-Post: 91 %
FVC-%Pred-Pre: 90 %
FVC-Post: 4.01 L
FVC-Pre: 3.98 L
Post FEV1/FVC ratio: 77 %
Post FEV6/FVC ratio: 100 %
Pre FEV1/FVC ratio: 77 %
Pre FEV6/FVC Ratio: 100 %
RV % pred: 84 %
RV: 2.21 L
TLC % pred: 84 %
TLC: 6.12 L

## 2019-10-19 MED ORDER — ANORO ELLIPTA 62.5-25 MCG/INH IN AEPB: 1.0000 | INHALATION_SPRAY | Freq: Every day | RESPIRATORY_TRACT | 3 refills | Status: DC

## 2019-10-19 NOTE — Assessment & Plan Note (Signed)
Plan: Continue Anoro Ellipta Walk today in office Spirometry with DLCO in 4 months Could consider trial off of Anoro Ellipta at next office visit if patient's lung functioning is stable as well as symptoms

## 2019-10-19 NOTE — Progress Notes (Signed)
@Patient  ID: Rodney Burns, male    DOB: 17-Sep-1945, 74 y.o.   MRN: [id number]  Chief Complaint  Patient presents with  . Follow-up    SOB    Referring provider: Hulan Fess, MD  HPI:  74 year old male former smoker followed in our office for post COVID-19 pneumonia  Specialized from 12/3 to 12/30/2018 with COVID-19 pneumonia.  He was treated with steroids and convalescent plasma.  He did not get remdesivir or Actemra due to severe transaminitis.  Discharged on 2 L oxygen Continues to have significant dyspnea on exertion.  Follow-up chest x-ray 12/28 with worsening bilateral infiltrates and was referred to pulmonary for further evaluation.  He has history of CLL, currently on monitoring under the care of Dr. 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  No mold, hot tub, Jacuzzi.  No feather pillows or comforters  Smoking history: 60-pack-year smoker.  Quit in 1994 Travel history: No significant recent travel history Relevant family history: No significant family history of lung disease  PMH: CLL, chronic atrial fibrillation-status post ablation-on Xarelto, history of CAD-s/p CABG 7425, chronic systolic heart failure, HTN, HLD.   Smoker/ Smoking History: Former smoker.  Quit 1994.  60-pack-year smoking history Maintenance: Anoro Ellipta Pt of: Dr. [name]  10/19/2019  - Visit   74 year old male former smoker initially consulted with our practice after COVID-19 infection requiring hospitalization and December/2020.  His last office visit was with Dr. [name] in July/2021.  Plan of care from that office visit was for him to remain on Anoro Ellipta, continue CPAP, obtain pulmonary function testing.  Patient has been tapered off of prednisone since March/2021 and off supplemental oxygen since then.  He was encouraged to continue exercise regimen.  Previous CT imaging shows probable UIP pattern.  Patient presenting  to office today after completing pulmonary function testing.  Those results are listed below:  10/19/2019-pulmonary function test-FVC 3.98 (90% predicted), postbronchodilator ratio 77, postbronchodilator FEV1 3.08 (96% predicted), TLC 6.12 (84% addicted), DLCO 14.26 (55% addicted)  Patient continues to report clinical improvement slowly since COVID-19 diagnosis in December/2020.  He feels that he is about 75 to 80% back to his baseline prior to Covid.  Patient plans on obtaining the seasonal flu vaccine.  He is also due for the Pneumovax 23.  He also plans to receive the shingles vaccine.  He remains adherent to Anoro Ellipta.  He is not have to use his rescue inhaler.  Patient reports adherence to using his CPAP.  He is currently using a full facemask.  He is wondering if he can be trialed on a nasal mask.  We will discuss this today.  Questionaires / Pulmonary Flowsheets:   ACT:  No flowsheet data found.  MMRC: mMRC Dyspnea Scale mMRC Score  10/19/2019 2  08/05/2019 1  03/16/2019 3    [place]:  No flowsheet data found.  Tests:   FENO:  No results found for: NITRICOXIDE  PFT: PFT Results Latest Ref Rng & Units 10/19/2019 03/29/2019  FVC-Pre L 3.98 3.31  FVC-Predicted Pre % 90 74  FVC-Post L 4.01 3.32  FVC-Predicted Post % 91 74  Pre FEV1/FVC % % 77 76  Post FEV1/FCV % % 77 78  FEV1-Pre L 3.07 2.52  FEV1-Predicted Pre % 96 77  FEV1-Post L 3.08 2.59  DLCO uncorrected ml/min/mmHg 14.26 14.25  DLCO UNC% % 55 54  DLCO corrected ml/min/mmHg 14.26 14.21  DLCO COR %Predicted % 55 54  DLVA Predicted % 62 69  TLC L 6.12 5.78  TLC % Predicted % 84 79  RV % Predicted % 84 85    WALK:  SIX MIN WALK 06/01/2019 04/01/2019 02/10/2019  Supplimental Oxygen during Test? (L/min) - - No  2 Minute Oxygen Saturation % 97 93 -  2 Minute HR 74 114 -  4 Minute Oxygen Saturation % 96 93 -  4 Minute HR 84 111 -  6 Minute Oxygen Saturation % 94 92 -  6 Minute HR 82 112 -  Tech Comments: - - Pt.  walked at a steady pace with minimal sob and cough. ER    Imaging: No results found.  Lab Results:  CBC    Component Value Date/Time   WBC 11.0 (H) 08/27/2019 0846   WBC 30.2 (H) 12/30/2018 0040   RBC 5.06 08/27/2019 0846   HGB 12.3 (L) 08/27/2019 0846   HGB 13.8 08/22/2016 0932   HCT 41.6 08/27/2019 0846   HCT 44.1 08/22/2016 0932   PLT 106 (L) 08/27/2019 0846   PLT 128 (L) 08/22/2016 0932   MCV 82.2 08/27/2019 0846   MCV 86.0 08/22/2016 0932   MCH 24.3 (L) 08/27/2019 0846   MCHC 29.6 (L) 08/27/2019 0846   RDW 17.1 (H) 08/27/2019 0846   RDW 14.3 08/22/2016 0932   LYMPHSABS 7.0 (H) 08/27/2019 0846   LYMPHSABS 16.6 (H) 08/22/2016 0932   MONOABS 1.0 08/27/2019 0846   MONOABS 0.3 08/22/2016 0932   EOSABS 0.1 08/27/2019 0846   EOSABS 0.1 08/22/2016 0932   BASOSABS 0.0 08/27/2019 0846   BASOSABS 0.0 08/22/2016 0932    BMET    Component Value Date/Time   NA 140 08/27/2019 0846   NA 140 11/06/2017 0842   NA 141 08/22/2016 0932   K 4.2 08/27/2019 0846   K 4.5 08/22/2016 0932   CL 108 08/27/2019 0846   CL 105 01/02/2012 0938   CO2 25 08/27/2019 0846   CO2 28 08/22/2016 0932   GLUCOSE 104 (H) 08/27/2019 0846   GLUCOSE 98 08/22/2016 0932   GLUCOSE 97 01/02/2012 0938   BUN 15 08/27/2019 0846   BUN 13 11/06/2017 0842   BUN 16.6 08/22/2016 0932   CREATININE 0.90 08/27/2019 0846   CREATININE 0.9 08/22/2016 0932   CALCIUM 9.6 08/27/2019 0846   CALCIUM 9.5 08/22/2016 0932   GFRNONAA >60 08/27/2019 0846   GFRAA >60 08/27/2019 0846    BNP    Component Value Date/Time   BNP 208.8 (H) 12/29/2018 0450    ProBNP No results found for: PROBNP  Specialty Problems      Pulmonary Problems   Sleep-disordered breathing   Acute respiratory failure with hypoxia (HCC)   Pneumonia due to COVID-19 virus   OSA (obstructive sleep apnea)   Shortness of breath      Allergies  Allergen Reactions  . Penicillins Other (See Comments)    Put pt in hospital 50 yrs ago  . Ace  Inhibitors Cough  . Darvocet [Propoxyphene N-Acetaminophen] Rash    Tolerates tylenol  . Sulfa Antibiotics Rash    Immunization History  Administered Date(s) Administered  . Influenza, High Dose Seasonal PF 10/27/2013, 11/03/2014, 11/23/2015, 10/26/2016, 09/28/2018  . Influenza,inj,Quad PF,6+ Mos 10/22/2012  . Influenza-Unspecified 11/21/2013, 11/04/2016  . PFIZER SARS-COV-2 Vaccination 04/02/2019, 04/22/2019    Past Medical History:  Diagnosis Date  . Atrial tachycardia ([place])    a. s/p DCCV 09/2009; b. s/p RFCA 03/2011; c. s/p DCCV 04/2011; d. s/p RFCA 09/17/11.  [name]  Cataract   . Chronic systolic CHF (congestive heart failure) ([place])    a. 01/2015 Echo: EF 40-45%, antsept/infsept HK, triv AI, mild MR, mod dil LA, nl RV, mildly dil RA.  [name] CLL (chronic lymphoblastic leukemia)    Stage 0-1  . Coronary artery disease involving native coronary artery without angina pectoris    a. Status post CABG in 1994:By Dr. Cyndia Bent. LIMA - L Cx, seqSVG-Diag-LAD, and SVG-PDA-PL.  [name] Cough    Consistant with ACE inhibitor mediated cough  . Dysrhythmia   . Glaucoma (increased eye pressure) 1991  . History of tobacco abuse    quit 1994  . Hypercholesterolemia    Well Controlled  . Hypertension   . Ischemic cardiomyopathy    a. 02/2011 Echo: EF 40-45%;  b. 01/2015 Echo: EF 40-45%.  . Malaria 1972   Hx of  . Pericarditis    a. 01/2015-->Treated w/ ibuprofen/colchicine.  . Sleep-disordered breathing 06/20/2011    Tobacco History: Social History   Tobacco Use  Smoking Status Former Smoker  . Packs/day: 2.00  . Years: 30.00  . Pack years: 60.00  . Types: Cigarettes  . Quit date: 01/22/1992  . Years since quitting: 27.7  Smokeless Tobacco Never Used   Counseling given: Yes   Continue to not smoke  Outpatient Encounter Medications as of 10/19/2019  Medication Sig  . albuterol (VENTOLIN HFA) 108 (90 Base) MCG/ACT inhaler Inhale 1 puff into the lungs every 6 (six) hours as needed for wheezing or  shortness of breath.  . digoxin (LANOXIN) 0.125 MG tablet Take 1 tablet (0.125 mg total) by mouth daily.  [name] Calcium (STOOL SOFTENER PO) Take by mouth 2 (two) times daily.  [name] ezetimibe-simvastatin (VYTORIN) 10-20 MG tablet TAKE 1 TABLET AT BEDTIME (KEEP UPCOMING APPOINTMENT FOR REFILLS)  . Fiber, Guar Gum, CHEW Chew 3 capsules by mouth daily.   [name] latanoprost (XALATAN) 0.005 % ophthalmic solution Place 1 drop into both eyes at bedtime.   . Magnesium 250 MG TABS Take 500 mg by mouth at bedtime.  . metoprolol succinate (TOPROL-XL) 50 MG 24 hr tablet Take 1 tablet (50 mg) in the mornings and half tablet (25 mg) in the evenings.  . pramipexole (MIRAPEX) 1.5 MG tablet Take 1.5 mg by mouth daily.  . RABEprazole (ACIPHEX) 20 MG tablet Take 1 tablet (20 mg total) by mouth daily.  . sacubitril-valsartan (ENTRESTO) 49-51 MG Take 1 tablet by mouth 2 (two) times daily.  [name] spironolactone (ALDACTONE) 25 MG tablet Take 1 tablet (25 mg total) by mouth daily. Take 1/2 tablet ( 12.5 mg ) daily  . umeclidinium-vilanterol (ANORO ELLIPTA) 62.5-25 MCG/INH AEPB Inhale 1 puff into the lungs daily.  [name] 20 MG TABS tablet TAKE 1 TABLET DAILY WITH SUPPER  . [DISCONTINUED] umeclidinium-vilanterol (ANORO ELLIPTA) 62.5-25 MCG/INH AEPB Inhale 1 puff into the lungs daily.   No facility-administered encounter medications on file as of 10/19/2019.     Review of Systems  Review of Systems  Constitutional: Positive for fatigue. Negative for activity change, chills, fever and unexpected weight change.  HENT: Negative for postnasal drip, rhinorrhea, sinus pressure, sinus pain and sore throat.   Eyes: Negative.   Respiratory: Positive for shortness of breath. Negative for cough and wheezing.   Cardiovascular: Negative for chest pain and palpitations.  Gastrointestinal: Negative for constipation, diarrhea, nausea and vomiting.  Endocrine: Negative.   Genitourinary: Negative.   Musculoskeletal: Negative.   Skin:  Negative.   Neurological: Negative for dizziness and headaches.  Psychiatric/Behavioral:  Negative.  Negative for dysphoric mood. The patient is not nervous/anxious.   All other systems reviewed and are negative.    Physical Exam  BP 124/72 (BP Location: Left Arm, Cuff Size: Normal)   Pulse (!) 52   Temp (!) 96.4 F (35.8 C) (Oral)   Ht 6' (1.829 m)   Wt 200 lb 9.6 oz (91 kg)   SpO2 97%   BMI 27.21 kg/m   Wt Readings from Last 5 Encounters:  10/19/19 200 lb 9.6 oz (91 kg)  08/27/19 204 lb (92.5 kg)  08/05/19 207 lb 3.2 oz (94 kg)  07/20/19 206 lb 9.6 oz (93.7 kg)  05/25/19 210 lb 15.7 oz (95.7 kg)    BMI Readings from Last 5 Encounters:  10/19/19 27.21 kg/m  08/27/19 29.27 kg/m  08/05/19 29.73 kg/m  07/20/19 29.64 kg/m  05/25/19 30.27 kg/m     Physical Exam Vitals and nursing note reviewed.  Constitutional:      General: He is not in acute distress.    Appearance: Normal appearance. He is normal weight.  HENT:     Head: Normocephalic and atraumatic.     Right Ear: Hearing, tympanic membrane, ear canal and external ear normal. There is no impacted cerumen.     Left Ear: Hearing, tympanic membrane, ear canal and external ear normal. There is no impacted cerumen.     Nose: Nose normal. No mucosal edema or rhinorrhea.     Right Turbinates: Not enlarged.     Left Turbinates: Not enlarged.     Mouth/Throat:     Mouth: Mucous membranes are dry.     Pharynx: Oropharynx is clear. No oropharyngeal exudate.  Eyes:     Pupils: Pupils are equal, round, and reactive to light.  Cardiovascular:     Rate and Rhythm: Normal rate and regular rhythm.     Pulses: Normal pulses.     Heart sounds: Normal heart sounds. No murmur heard.   Pulmonary:     Effort: Pulmonary effort is normal.     Breath sounds: Rales (Bibasilar crackles) present. No decreased breath sounds or wheezing.  Musculoskeletal:     Cervical back: Normal range of motion.     Right lower leg: No edema.      Left lower leg: No edema.  Lymphadenopathy:     Cervical: No cervical adenopathy.  Skin:    General: Skin is warm and dry.     Capillary Refill: Capillary refill takes less than 2 seconds.     Findings: No erythema or rash.  Neurological:     General: No focal deficit present.     Mental Status: He is alert and oriented to person, place, and time.     Motor: No weakness.     Coordination: Coordination normal.     Gait: Gait is intact. Gait normal.  Psychiatric:        Mood and Affect: Mood normal.        Behavior: Behavior normal. Behavior is cooperative.        Thought Content: Thought content normal.        Judgment: Judgment normal.       Assessment & Plan:   OSA (obstructive sleep apnea) Plan: Continue CPAP Ordered DME company for mask of choice  Chronic systolic CHF (congestive heart failure) (HCC) Plan: Continue CPAP Continue Entresto  Pneumonia due to COVID-19 virus Reviewed pulmonary function testing with patient today  Plan: Walk today in office Follow-up in 4 months Repeat pulmonary function  testing in 4 months  Shortness of breath Plan: Continue Anoro Ellipta Walk today in office Spirometry with DLCO in 4 months Could consider trial off of Anoro Ellipta at next office visit if patient's lung functioning is stable as well as symptoms  Healthcare maintenance Obtain seasonal flu vaccine in fall/2021 Obtain Pneumovax 23 Would recommend discussing with primary care receiving the shingles vaccine   CLL (chronic lymphocytic leukemia) ([place]) Continue follow-up with oncology    Return in about 4 months (around 02/18/2020), or if symptoms worsen or fail to improve, for Follow up with Dr. [name], Follow up for PFT - 43min, Spiro with DLCO.   Lauraine Rinne, NP 10/19/2019   This appointment required 42 minutes of patient care (this includes precharting, chart review, review of results, face-to-face care, etc.).

## 2019-10-19 NOTE — Patient Instructions (Addendum)
You were seen today by Lauraine Rinne, NP  for:   1. Pneumonia due to COVID-19 virus  - umeclidinium-vilanterol (ANORO ELLIPTA) 62.5-25 MCG/INH AEPB; Inhale 1 puff into the lungs daily.  Dispense: 180 each; Refill: 3 - Pulmonary function test; Future  Breathing test today look stable  Walk today in office  We will repeat a 30-minute breathing test in January/2022  2. OSA (obstructive sleep apnea)  We will send an order to the DME company for mask of choice  We recommend that you continue using your CPAP daily >>>Keep up the hard work using your device >>> Goal should be wearing this for the entire night that you are sleeping, at least 4 to 6 hours  Remember:  . Do not drive or operate heavy machinery if tired or drowsy.  . Please notify the supply company and office if you are unable to use your device regularly due to missing supplies or machine being broken.  . Work on maintaining a healthy weight and following your recommended nutrition plan  . Maintain proper daily exercise and movement  . Maintaining proper use of your device can also help improve management of other chronic illnesses such as: Blood pressure, blood sugars, and weight management.   BiPAP/ CPAP Cleaning:  >>>Clean weekly, with Dawn soap, and bottle brush.  Set up to air dry. >>> Wipe mask out daily with wet wipe or towelette    3. Shortness of breath  - umeclidinium-vilanterol (ANORO ELLIPTA) 62.5-25 MCG/INH AEPB; Inhale 1 puff into the lungs daily.  Dispense: 180 each; Refill: 3  Anoro Ellipta  >>> Take 1 puff daily in the morning right when you wake up >>>Rinse your mouth out after use >>>This is a daily maintenance inhaler, NOT a rescue inhaler >>>Contact our office if you are having difficulties affording or obtaining this medication >>>It is important for you to be able to take this daily and not miss any doses     4. Chronic systolic CHF (congestive heart failure) (HCC)  Continue  Entresto  Continue follow-up with cardiology and primary care  Monitor weights   5. Healthcare maintenance  Obtain seasonal flu vaccine as soon as possible  After obtaining the seasonal flu vaccine okay to wait 2 to 4 weeks to obtain Pneumovax 23 which is the pneumonia vaccine  Discussed with primary care obtaining the shingles vaccine-Shingrix  We recommend today:  Orders Placed This Encounter  Procedures  . Pulmonary function test    Arlyce Harman with dlco    Standing Status:   Future    Standing Expiration Date:   10/18/2020    Scheduling Instructions:     Jan/2022    Order Specific Question:   Where should this test be performed?    Answer:   [facility]   Orders Placed This Encounter  Procedures  . Pulmonary function test   Meds ordered this encounter  Medications  . umeclidinium-vilanterol (ANORO ELLIPTA) 62.5-25 MCG/INH AEPB    Sig: Inhale 1 puff into the lungs daily.    Dispense:  180 each    Refill:  3    Follow Up:    Return in about 4 months (around 02/18/2020), or if symptoms worsen or fail to improve, for Follow up with Dr. [name], Follow up for PFT - 35min, Spiro with DLCO.   Notification of test results are managed in the following manner: If there are  any recommendations or changes to the  plan of care discussed in office today,  we will contact you and let you know what they are. If you do not hear from [place], then your results are normal and you can view them through your  MyChart account , or a letter will be sent to you. Thank you again for trusting [place] with your care  - Thank you, [facility]    It is flu season:   >>> Best ways to protect herself from the flu: Receive the yearly flu vaccine, practice good hand hygiene washing with soap and also using hand sanitizer when available, eat a nutritious meals, get adequate rest, hydrate appropriately       Please contact the office if your symptoms worsen or you have concerns that you are not  improving.   Thank you for choosing [facility] for your healthcare, and for allowing [place] to partner with you on your healthcare journey. I am thankful to be able to provide care to you today.   Wyn Quaker FNP-C

## 2019-10-19 NOTE — Progress Notes (Signed)
PFT done today. 

## 2019-10-19 NOTE — Assessment & Plan Note (Signed)
Continue follow-up with oncology 

## 2019-10-19 NOTE — Assessment & Plan Note (Signed)
Plan: Continue CPAP Ordered DME company for mask of choice

## 2019-10-19 NOTE — Assessment & Plan Note (Signed)
Obtain seasonal flu vaccine in fall/2021 Obtain Pneumovax 23 Would recommend discussing with primary care receiving the shingles vaccine

## 2019-10-19 NOTE — Assessment & Plan Note (Signed)
Plan: Continue CPAP Continue Rodney Burns

## 2019-10-19 NOTE — Assessment & Plan Note (Signed)
Reviewed pulmonary function testing with patient today  Plan: Walk today in office Follow-up in 4 months Repeat pulmonary function testing in 4 months

## 2019-10-26 ENCOUNTER — Ambulatory Visit
Admission: RE | Admit: 2019-10-26 | Discharge: 2019-10-26 | Disposition: A | Discharge status: Home / Self Care | Payer: Medicare Other | Source: Ambulatory Visit | Attending: Pulmonary Disease | Admitting: Pulmonary Disease

## 2019-10-26 ENCOUNTER — Other Ambulatory Visit: Payer: Self-pay

## 2019-10-26 DIAGNOSIS — R0602 Shortness of breath: Secondary | ICD-10-CM

## 2019-10-26 DIAGNOSIS — J84112 Idiopathic pulmonary fibrosis: Secondary | ICD-10-CM | POA: Diagnosis not present

## 2019-10-26 DIAGNOSIS — J432 Centrilobular emphysema: Secondary | ICD-10-CM | POA: Diagnosis not present

## 2019-10-26 DIAGNOSIS — J90 Pleural effusion, not elsewhere classified: Secondary | ICD-10-CM | POA: Diagnosis not present

## 2019-10-26 DIAGNOSIS — I700 Atherosclerosis of aorta: Secondary | ICD-10-CM | POA: Diagnosis not present

## 2019-10-28 ENCOUNTER — Encounter: Payer: Self-pay | Admitting: Cardiology

## 2019-10-28 ENCOUNTER — Ambulatory Visit (INDEPENDENT_AMBULATORY_CARE_PROVIDER_SITE_OTHER): Payer: Medicare Other | Admitting: Cardiology

## 2019-10-28 ENCOUNTER — Other Ambulatory Visit: Payer: Self-pay

## 2019-10-28 VITALS — BP 132/70 | HR 73 | Ht 70.5 in | Wt 201.4 lb

## 2019-10-28 DIAGNOSIS — I25810 Atherosclerosis of coronary artery bypass graft(s) without angina pectoris: Secondary | ICD-10-CM

## 2019-10-28 DIAGNOSIS — J989 Respiratory disorder, unspecified: Secondary | ICD-10-CM | POA: Insufficient documentation

## 2019-10-28 DIAGNOSIS — U071 COVID-19: Secondary | ICD-10-CM | POA: Diagnosis not present

## 2019-10-28 DIAGNOSIS — I5022 Chronic systolic (congestive) heart failure: Secondary | ICD-10-CM | POA: Diagnosis not present

## 2019-10-28 DIAGNOSIS — I4892 Unspecified atrial flutter: Secondary | ICD-10-CM

## 2019-10-28 DIAGNOSIS — C9110 Chronic lymphocytic leukemia of B-cell type not having achieved remission: Secondary | ICD-10-CM

## 2019-10-28 DIAGNOSIS — Z951 Presence of aortocoronary bypass graft: Secondary | ICD-10-CM | POA: Diagnosis not present

## 2019-10-28 NOTE — Assessment & Plan Note (Signed)
Status post CABG in 1994:By Dr. Cyndia Bent. LIMA - L Cx, seqSVG-Diag-LAD, and SVG-PDA-PL Myoview low risk March 2016

## 2019-10-28 NOTE — Assessment & Plan Note (Signed)
Hospitalized Dec 2020

## 2019-10-28 NOTE — Patient Instructions (Signed)
Medication Instructions:  DECREASE- Spironolactone 12.5 mg mouth daily DECREASE- Metoprolol Succinate 50 mg by mouth daily HOLD- Metoprolol if heart rate is 50 or below  *If you need a refill on your cardiac medications before your next appointment, please call your pharmacy*   Lab Work: None Ordered   Testing/Procedures: None Ordered   Follow-Up: At [facility], you and your health needs are our priority.  As part of our continuing mission to provide you with exceptional heart care, we have created designated Provider Care Teams.  These Care Teams include your primary Cardiologist (physician) and Advanced Practice Providers (APPs -  Physician Assistants and Nurse Practitioners) who all work together to provide you with the care you need, when you need it.  We recommend signing up for the patient portal called "MyChart".  Sign up information is provided on this After Visit Summary.  MyChart is used to connect with patients for Virtual Visits (Telemedicine).  Patients are able to view lab/test results, encounter notes, upcoming appointments, etc.  Non-urgent messages can be sent to your provider as well.   To learn more about what you can do with MyChart, go to [email or web address].    Your next appointment:   2-3 month(s)  The format for your next appointment:   In Person  Provider:   You may see [name], MD or one of the following Advanced Practice Providers on your designated Care Team:    Almyra Deforest, PA-C  Fabian Sharp, PA-C or   Roby Lofts, [place]

## 2019-10-28 NOTE — Assessment & Plan Note (Signed)
S/P RFA 2011, and 2013 with recurrence of A flutter- plan is rate control and anticogulation He is in NSR today with 1st AVB

## 2019-10-28 NOTE — Progress Notes (Signed)
Cardiology Office Note:    Date:  10/28/2019   ID:  Rodney Burns, DOB 1945-08-10, MRN [id number]  PCP:  Hulan Fess, MD  Cardiologist:  [name], MD  Electrophysiologist:  None   Referring MD: Hulan Fess, MD   No chief complaint on file.   History of Present Illness:    Rodney Burns is a pleasant 74 y.o. male with a hx of CAD, status post CABG x5 in 1994.  He had a low risk Myoview in March 2016.  He has had a cardiomyopathy with an EF of 40 to 45% in the past.  Other medical issues include PAF status post ablation in 2011 and 2013, hypertension, CLL, and dyslipidemia.    The patient was admitted in December 2020 with CO VID pneumonia.  His ejection fraction by echocardiogram in March 2021 was down to 35 to 40%.  He has had a slow recovery.  He is being followed by pulmonary.  For time he was on oxygen at home but now is only on CPAP.  He had a follow-up CT scan 10/26/2019 that showed pulmonary fibrosis.  He is in the office today for routine follow-up.  He is staying active.  He says he gets 2 to 4 miles of walking in a day.  He notes in the mornings about an hour or so after he takes his medicine he gets quite dizzy if he stands up.  He brought in a list of blood pressures from home.  It seems in the morning and early afternoon his blood pressure drops below 017 systolic.  In the evening his heart rate drops into the low 50s and mid 40s.    Past Medical History:  Diagnosis Date  . Atrial tachycardia ([place])    a. s/p DCCV 09/2009; b. s/p RFCA 03/2011; c. s/p DCCV 04/2011; d. s/p RFCA 09/17/11.  . Cataract   . Chronic systolic CHF (congestive heart failure) ([place])    a. 01/2015 Echo: EF 40-45%, antsept/infsept HK, triv AI, mild MR, mod dil LA, nl RV, mildly dil RA.  [name] CLL (chronic lymphoblastic leukemia)    Stage 0-1  . Coronary artery disease involving native coronary artery without angina pectoris    a. Status post CABG in 1994:By Dr. Cyndia Bent. LIMA - L Cx, seqSVG-Diag-LAD, and  SVG-PDA-PL.  [name] Cough    Consistant with ACE inhibitor mediated cough  . Dysrhythmia   . Glaucoma (increased eye pressure) 1991  . History of tobacco abuse    quit 1994  . Hypercholesterolemia    Well Controlled  . Hypertension   . Ischemic cardiomyopathy    a. 02/2011 Echo: EF 40-45%;  b. 01/2015 Echo: EF 40-45%.  . Malaria 1972   Hx of  . Pericarditis    a. 01/2015-->Treated w/ ibuprofen/colchicine.  . Sleep-disordered breathing 06/20/2011    Past Surgical History:  Procedure Laterality Date  . ATRIAL FLUTTER ABLATION N/A 04/11/2011   Procedure: ATRIAL FLUTTER ABLATION;  Surgeon: Deboraha Sprang, MD;  Location: [facility] CATH LAB;  Service: Cardiovascular;  Laterality: N/A;  . ATRIAL FLUTTER ABLATION N/A 09/17/2011   Procedure: ATRIAL FLUTTER ABLATION;  Surgeon: Thompson Grayer, MD;  Location: [facility] CATH LAB;  Service: Cardiovascular;  Laterality: N/A;  . CARDIOVERSION  06/03/2011   Procedure: CARDIOVERSION;  Surgeon: Deboraha Sprang, MD;  Location: [place];  Service: Cardiovascular;  Laterality: N/A;  . CATARACT EXTRACTION    . CORONARY ARTERY BYPASS GRAFT  1994   By Dr. Cyndia Bent. LIMA - L  Cx, seqSVG-Diag-LAD, and SVG-PDA-PL  . ELECTROPHYSIOLOGY STUDY N/A 04/11/2011   Procedure: ELECTROPHYSIOLOGY STUDY;  Surgeon: Deboraha Sprang, MD;  Location: [facility] CATH LAB;  Service: Cardiovascular;  Laterality: N/A;  . EP study and ablation  2013   by Dr Caryl Comes, repeat ablation by Dr Rayann Heman  . NM MYOVIEW LTD  03/2014   scar in the inferior and anteroapical regions without ischemia. This is similar to finding on Myoview in 2013. EF is a little lower 39%>>32%.  [name] TRIGGER FINGER RELEASE  12/2016   left hand   . US ECHOCARDIOGRAPHY  09-07-09; 02/2011   a. Est EF 40-45%; b. EF 40-45%. Gr 2 DD. Mild LA dil    Current Medications: Current Meds  Medication Sig  . albuterol (VENTOLIN HFA) 108 (90 Base) MCG/ACT inhaler Inhale 1 puff into the lungs every 6 (six) hours as needed for wheezing or shortness of breath.  . digoxin  (LANOXIN) 0.125 MG tablet Take 1 tablet (0.125 mg total) by mouth daily.  [name] Calcium (STOOL SOFTENER PO) Take by mouth 2 (two) times daily.  [name] ezetimibe-simvastatin (VYTORIN) 10-20 MG tablet TAKE 1 TABLET AT BEDTIME (KEEP UPCOMING APPOINTMENT FOR REFILLS)  . Fiber, Guar Gum, CHEW Chew 3 capsules by mouth daily.   [name] latanoprost (XALATAN) 0.005 % ophthalmic solution Place 1 drop into both eyes at bedtime.   . Magnesium 250 MG TABS Take 500 mg by mouth at bedtime.  . metoprolol succinate (TOPROL-XL) 50 MG 24 hr tablet Take 50 mg by mouth daily. Take with or immediately following a meal.  . pramipexole (MIRAPEX) 1.5 MG tablet Take 1.5 mg by mouth daily.  . RABEprazole (ACIPHEX) 20 MG tablet Take 1 tablet (20 mg total) by mouth daily.  . sacubitril-valsartan (ENTRESTO) 49-51 MG Take 1 tablet by mouth 2 (two) times daily.  [name] spironolactone (ALDACTONE) 25 MG tablet Take 12.5 mg by mouth daily.  [name] umeclidinium-vilanterol (ANORO ELLIPTA) 62.5-25 MCG/INH AEPB Inhale 1 puff into the lungs daily.  [name] 20 MG TABS tablet TAKE 1 TABLET DAILY WITH SUPPER  . [DISCONTINUED] metoprolol succinate (TOPROL-XL) 50 MG 24 hr tablet Take 1 tablet (50 mg) in the mornings and half tablet (25 mg) in the evenings.  . [DISCONTINUED] spironolactone (ALDACTONE) 25 MG tablet Take 1 tablet (25 mg total) by mouth daily. Take 1/2 tablet ( 12.5 mg ) daily (Patient taking differently: Take 25 mg by mouth daily. Take 1 tablet daily)     Allergies:   Penicillins, Ace inhibitors, Darvocet [propoxyphene n-acetaminophen], and Sulfa antibiotics   Social History   Socioeconomic History  . Marital status: Married    Spouse name: Not on file  . Number of children: 2  . Years of education: Not on file  . Highest education level: Not on file  Occupational History  . Occupation: Careers adviser: RETIRED  Tobacco Use  . Smoking status: Former Smoker    Packs/day: 2.00    Years: 30.00    Pack years: 60.00     Types: Cigarettes    Quit date: 01/22/1992    Years since quitting: 27.7  . Smokeless tobacco: Never Used  Vaping Use  . Vaping Use: Never used  Substance and Sexual Activity  . Alcohol use: No  . Drug use: No  . Sexual activity: Yes  Other Topics Concern  . Not on file  Social History Narrative  . Not on file   Social Determinants of Health   Financial Resource Strain:   .  Difficulty of Paying Living Expenses: Not on file  Food Insecurity:   . Worried About Charity fundraiser in the Last Year: Not on file  . Ran Out of Food in the Last Year: Not on file  Transportation Needs:   . Lack of Transportation (Medical): Not on file  . Lack of Transportation (Non-Medical): Not on file  Physical Activity:   . Days of Exercise per Week: Not on file  . Minutes of Exercise per Session: Not on file  Stress:   . Feeling of Stress : Not on file  Social Connections:   . Frequency of Communication with Friends and Family: Not on file  . Frequency of Social Gatherings with Friends and Family: Not on file  . Attends Religious Services: Not on file  . Active Member of Clubs or Organizations: Not on file  . Attends Archivist Meetings: Not on file  . Marital Status: Not on file     Family History: The patient's family history includes Cancer in his brother; Down syndrome in his son; Heart failure in his father.  ROS:   Please see the history of present illness.     All other systems reviewed and are negative.  EKGs/Labs/Other Studies Reviewed:    The following studies were reviewed today: Echo 04/14/2019- RESSIONS    1. Left ventricular ejection fraction, by estimation, is 35 to 40%. The  left ventricle has moderately decreased function. The left ventricle  demonstrates global hypokinesis. Left ventricular diastolic parameters are  indeterminate.  2. Right ventricular systolic function is mildly reduced. The right  ventricular size is normal. There is normal pulmonary  artery systolic  pressure. The estimated right ventricular systolic pressure is 16.1 mmHg.  3. The mitral valve is normal in structure. Trivial mitral valve  regurgitation. No evidence of mitral stenosis.  4. The aortic valve is tricuspid. Aortic valve regurgitation is trivial.  Mild aortic valve sclerosis is present, with no evidence of aortic valve  stenosis.  5. Aortic dilatation noted. There is mild dilatation of the aortic root  measuring 37 mm.  6. The inferior vena cava is normal in size with greater than 50%  respiratory variability, suggesting right atrial pressure of 3 mmHg.  7. The patient was in atrial flutter.   EKG:  EKG is ordered today.  The ekg ordered today demonstrates NSR, HR 72, 1st AVB, inferior Qs, poor anterior RW  Recent Labs: 12/29/2018: B Natriuretic Peptide 208.8; Magnesium 2.4 08/27/2019: ALT 13; BUN 15; Creatinine 0.90; Hemoglobin 12.3; Platelet Count 106; Potassium 4.2; Sodium 140  Recent Lipid Panel    Component Value Date/Time   CHOL 108 10/16/2017 1435   TRIG 93 12/24/2018 1134   HDL 42 10/16/2017 1435   LDLCALC 55 10/16/2017 1435    Physical Exam:    VS:  BP 132/70   Pulse 73   Ht 5' 10.5" (1.791 m)   Wt 201 lb 6.4 oz (91.4 kg)   SpO2 93%   BMI 28.49 kg/m     Wt Readings from Last 3 Encounters:  10/28/19 201 lb 6.4 oz (91.4 kg)  10/19/19 200 lb 9.6 oz (91 kg)  08/27/19 204 lb (92.5 kg)     GEN: Well nourished, well developed in no acute distress HEENT: Normal NECK: No JVD; No carotid bruits CARDIAC: RRR, no murmurs, rubs, gallops RESPIRATORY:  decreased breath sounds without rales, wheezing or rhonchi  ABDOMEN: Soft, non-tender, non-distended MUSCULOSKELETAL:  No edema; No deformity  SKIN: Warm and  dry NEUROLOGIC:  Alert and oriented x 3 PSYCHIATRIC:  Normal affect   ASSESSMENT:    COVID Hospitalized Dec 2020  Chronic respiratory disease Emphysema and ILD by CT scan post COVID  Hx of CABG x 5 '94 Status post CABG in  1994:By Dr. Cyndia Bent. LIMA - L Cx, seqSVG-Diag-LAD, and SVG-PDA-PL Myoview low risk March 2016  Atrial flutter-atypical S/P RFA 2011, and 2013 with recurrence of A flutter- plan is rate control and anticogulation He is in NSR today with 1st AVB  CLL (chronic lymphocytic leukemia) ([facility]) Doing well, WBC down to 51W  Chronic systolic CHF (congestive heart failure) ([facility]) EF down to 35-40% post COVID March 2021  PLAN:    He is having symptomatic orthostatic hypotension and bradycardia- decrease Aldactone to 12.5 mg daily, decrease Toprol to 50 mg daily.  Recent labs look good.  F/U with Dr Martinique in a few months.    Medication Adjustments/Labs and Tests Ordered: Current medicines are reviewed at length with the patient today.  Concerns regarding medicines are outlined above.  Orders Placed This Encounter  Procedures  . EKG 12-Lead   No orders of the defined types were placed in this encounter.   Patient Instructions  Medication Instructions:  DECREASE- Spironolactone 12.5 mg mouth daily DECREASE- Metoprolol Succinate 50 mg by mouth daily HOLD- Metoprolol if heart rate is 50 or below  *If you need a refill on your cardiac medications before your next appointment, please call your pharmacy*   Lab Work: None Ordered   Testing/Procedures: None Ordered   Follow-Up: At [facility], you and your health needs are our priority.  As part of our continuing mission to provide you with exceptional heart care, we have created designated Provider Care Teams.  These Care Teams include your primary Cardiologist (physician) and Advanced Practice Providers (APPs -  Physician Assistants and Nurse Practitioners) who all work together to provide you with the care you need, when you need it.  We recommend signing up for the patient portal called "MyChart".  Sign up information is provided on this After Visit Summary.  MyChart is used to connect with patients for Virtual Visits (Telemedicine).   Patients are able to view lab/test results, encounter notes, upcoming appointments, etc.  Non-urgent messages can be sent to your provider as well.   To learn more about what you can do with MyChart, go to [email or web address].    Your next appointment:   2-3 month(s)  The format for your next appointment:   In Person  Provider:   You may see [name], MD or one of the following Advanced Practice Providers on your designated Care Team:    Almyra Deforest, PA-C  Fabian Sharp, PA-C or   Roby Lofts, PA-C        Signed, Cote Mayabb, [place]  10/28/2019 4:30 PM    [place]

## 2019-10-28 NOTE — Assessment & Plan Note (Signed)
Emphysema and ILD by CT scan post COVID

## 2019-10-28 NOTE — Assessment & Plan Note (Signed)
Doing well, WBC down to 11k

## 2019-10-28 NOTE — Assessment & Plan Note (Signed)
EF down to 35-40% post COVID March 2021

## 2019-11-08 ENCOUNTER — Ambulatory Visit: Payer: Medicare Other | Admitting: Cardiology

## 2019-11-24 DIAGNOSIS — R7301 Impaired fasting glucose: Secondary | ICD-10-CM | POA: Diagnosis not present

## 2019-12-27 DIAGNOSIS — H1131 Conjunctival hemorrhage, right eye: Secondary | ICD-10-CM | POA: Diagnosis not present

## 2019-12-31 ENCOUNTER — Telehealth: Payer: Self-pay | Admitting: Cardiology

## 2019-12-31 DIAGNOSIS — E785 Hyperlipidemia, unspecified: Secondary | ICD-10-CM | POA: Diagnosis not present

## 2019-12-31 DIAGNOSIS — I10 Essential (primary) hypertension: Secondary | ICD-10-CM | POA: Diagnosis not present

## 2019-12-31 DIAGNOSIS — I5020 Unspecified systolic (congestive) heart failure: Secondary | ICD-10-CM | POA: Diagnosis not present

## 2019-12-31 DIAGNOSIS — I2510 Atherosclerotic heart disease of native coronary artery without angina pectoris: Secondary | ICD-10-CM | POA: Diagnosis not present

## 2019-12-31 DIAGNOSIS — E1169 Type 2 diabetes mellitus with other specified complication: Secondary | ICD-10-CM | POA: Diagnosis not present

## 2019-12-31 NOTE — Telephone Encounter (Signed)
Spoke to patient Dr.Jordan's advice given. 

## 2019-12-31 NOTE — Telephone Encounter (Signed)
Sounds like he had a vitreous or possibly retinal hemorrhage. I would hold Xarelto for now. Once bleeding stabilized from an eye standpoint would recommend resuming Xarelto.   Jazyiah Yiu Martinique MD, [facility]

## 2019-12-31 NOTE — Telephone Encounter (Signed)
Returned call to pt he states that he went to the eye doctor on Tuesday(Dr Dannial [facility] [phone]) and states that it is "a little worse" pt states that he suggested to hold/stop blood thinner. Pt states eye "feels heavy" he has not stopped blood thinner. Please advise.

## 2019-12-31 NOTE — Telephone Encounter (Signed)
New message:     Patient calling stating on Tuesday his eye vessle busted in the right eye and his doctor told him it was full of blood all the way to back of the eye. Patient doctor seems to think it might from the medication.

## 2020-01-28 ENCOUNTER — Ambulatory Visit (INDEPENDENT_AMBULATORY_CARE_PROVIDER_SITE_OTHER): Payer: Medicare Other | Admitting: Physician Assistant

## 2020-01-28 ENCOUNTER — Encounter: Payer: Self-pay | Admitting: Physician Assistant

## 2020-01-28 ENCOUNTER — Other Ambulatory Visit: Payer: Self-pay

## 2020-01-28 VITALS — BP 122/74 | HR 78 | Ht 70.0 in | Wt 199.6 lb

## 2020-01-28 DIAGNOSIS — I4892 Unspecified atrial flutter: Secondary | ICD-10-CM

## 2020-01-28 DIAGNOSIS — E785 Hyperlipidemia, unspecified: Secondary | ICD-10-CM

## 2020-01-28 DIAGNOSIS — I25810 Atherosclerosis of coronary artery bypass graft(s) without angina pectoris: Secondary | ICD-10-CM | POA: Diagnosis not present

## 2020-01-28 DIAGNOSIS — I5022 Chronic systolic (congestive) heart failure: Secondary | ICD-10-CM | POA: Diagnosis not present

## 2020-01-28 DIAGNOSIS — I10 Essential (primary) hypertension: Secondary | ICD-10-CM

## 2020-01-28 NOTE — Progress Notes (Signed)
Cardiology Office Note:    Date:  01/30/2020   ID:  BARETT WHIDBEE, DOB 08-16-1945, MRN [id number]  PCP:  Hulan Fess, MD  [facility] Cardiologist:  [name], MD  [place] Electrophysiologist:  None   Referring MD: Hulan Fess, MD   Chief Complaint  Patient presents with  . Follow-up    Seen for Dr. Martinique    History of Present Illness:    Rodney Burns is a 75 y.o. male with a hx of atrial flutter, HTN, HLD, CAD s/p CABG 1994, pericarditis in January 0539, chronic systolic heart failure, OSA on CPAP, CLL and thoracic aortic aneurysm.  He had radiofrequency ablation in 2013 however had recurrent atrial flutter after the ablation.  Myoview in March 2016 showed inferior and anteroapical infarct without ischemia, EF 32%.  Echocardiogram obtained in January 2070 due to pericarditis showed EF 40 to 45%, hypokinesis of the anteroseptal and inferoseptal myocardium, trivial AI, mild MR.  Repeat echocardiogram in October 2090 showed EF 40 to 45%, hypokinesis of the anteroseptal and inferoseptal myocardium, grade 1 DD, mild MR, 40 mm aortic root dilatation.  He was tested positive for COVID-19 in December 2020 and has had a significant acute hypoxic respiratory failure.  He also had a significant transaminitis and was unable to get remdesivir or Actemra.  Toprol-XL was increased and he was given IV digoxin.  CTA January 2021 showed pulmonary fibrosis.  He has been able to wean off of oxygen.  He also completed pulmonary rehab.  Repeat echocardiogram obtained in March 2021 showed EF 35 to 40%, global hypokinesis, normal pulmonary artery systolic pressure, trivial AI, mild dilatation of the aortic root measuring at 98mm.  Spironolactone was added to his medical regimen at the time.  Note, he was in atrial flutter during the echocardiogram even though EKG prior to that in December 2020 showed a sinus rhythm. Since then, patient has been seen by Kerin Ransom in October 2021, EKG at the time shows  that he is back in sinus rhythm.  He was also having symptomatic orthostatic hypotension at the time and bradycardia, spironolactone was decreased to 12.5 mg daily, Toprol-XL decreased to 50 mg daily.  Patient presents today for follow-up.  He denies any recent chest pain or shortness of breath.  Blood pressure has came up with since he backed down the medication.  However going down to 50 mg daily of Toprol-XL, he actually lower the Toprol-XL to 25 mg daily.  He is maintaining sinus rhythm based on physical exam.  Heart rate quite regular without any heart murmur.  Since digoxin was supposed to be used only as a temporary solution due to elevated heart rate after COVID-19 infection and he is heart rate is now in the 50s, I recommended discontinuation of digoxin at this point.  His last lipid panel obtained in March 2021 was very well controlled, he is seen Dr. Rex Kras for annual physical in February and will defer to PCP to get annual blood work.  Overall, he is doing quite well from cardiac perspective and can follow-up with Dr. Martinique in 6 months.  He did get a Kardio mobile device and has been recording his strips at home.  He is aware to send [place] strips if his resting heart rate is greater than 100 bpm so we can make sure he does not go back into atrial flutter.   Past Medical History:  Diagnosis Date  . Atrial tachycardia ([place])    a. s/p DCCV  09/2009; b. s/p RFCA 03/2011; c. s/p DCCV 04/2011; d. s/p RFCA 09/17/11.  . Cataract   . Chronic systolic CHF (congestive heart failure) ([place])    a. 01/2015 Echo: EF 40-45%, antsept/infsept HK, triv AI, mild MR, mod dil LA, nl RV, mildly dil RA.  [name] CLL (chronic lymphoblastic leukemia)    Stage 0-1  . Coronary artery disease involving native coronary artery without angina pectoris    a. Status post CABG in 1994:By Dr. Cyndia Bent. LIMA - L Cx, seqSVG-Diag-LAD, and SVG-PDA-PL.  [name] Cough    Consistant with ACE inhibitor mediated cough  . Dysrhythmia   . Glaucoma  (increased eye pressure) 1991  . History of tobacco abuse    quit 1994  . Hypercholesterolemia    Well Controlled  . Hypertension   . Ischemic cardiomyopathy    a. 02/2011 Echo: EF 40-45%;  b. 01/2015 Echo: EF 40-45%.  . Malaria 1972   Hx of  . Pericarditis    a. 01/2015-->Treated w/ ibuprofen/colchicine.  . Sleep-disordered breathing 06/20/2011    Past Surgical History:  Procedure Laterality Date  . ATRIAL FLUTTER ABLATION N/A 04/11/2011   Procedure: ATRIAL FLUTTER ABLATION;  Surgeon: Deboraha Sprang, MD;  Location: [facility] CATH LAB;  Service: Cardiovascular;  Laterality: N/A;  . ATRIAL FLUTTER ABLATION N/A 09/17/2011   Procedure: ATRIAL FLUTTER ABLATION;  Surgeon: Thompson Grayer, MD;  Location: [facility] CATH LAB;  Service: Cardiovascular;  Laterality: N/A;  . CARDIOVERSION  06/03/2011   Procedure: CARDIOVERSION;  Surgeon: Deboraha Sprang, MD;  Location: [facility] [place];  Service: Cardiovascular;  Laterality: N/A;  . CATARACT EXTRACTION    . CORONARY ARTERY BYPASS GRAFT  1994   By Dr. Cyndia Bent. LIMA - L Cx, seqSVG-Diag-LAD, and SVG-PDA-PL  . ELECTROPHYSIOLOGY STUDY N/A 04/11/2011   Procedure: ELECTROPHYSIOLOGY STUDY;  Surgeon: Deboraha Sprang, MD;  Location: [facility] CATH LAB;  Service: Cardiovascular;  Laterality: N/A;  . EP study and ablation  2013   by Dr Caryl Comes, repeat ablation by Dr Rayann Heman  . NM MYOVIEW LTD  03/2014   scar in the inferior and anteroapical regions without ischemia. This is similar to finding on Myoview in 2013. EF is a little lower 39%>>32%.  [name] TRIGGER FINGER RELEASE  12/2016   left hand   . US ECHOCARDIOGRAPHY  09-07-09; 02/2011   a. Est EF 40-45%; b. EF 40-45%. Gr 2 DD. Mild LA dil    Current Medications: Current Meds  Medication Sig  . albuterol (VENTOLIN HFA) 108 (90 Base) MCG/ACT inhaler Inhale 1 puff into the lungs every 6 (six) hours as needed for wheezing or shortness of breath.  [name] Calcium (STOOL SOFTENER PO) Take by mouth 2 (two) times daily.  [name] ezetimibe-simvastatin (VYTORIN)  10-20 MG tablet TAKE 1 TABLET AT BEDTIME (KEEP UPCOMING APPOINTMENT FOR REFILLS)  . Fiber, Guar Gum, CHEW Chew 3 capsules by mouth daily.   [name] latanoprost (XALATAN) 0.005 % ophthalmic solution Place 1 drop into both eyes at bedtime.  . Magnesium 250 MG TABS Take 500 mg by mouth at bedtime.  . metoprolol succinate (TOPROL-XL) 50 MG 24 hr tablet Take 25 mg by mouth daily. Take with or immediately following a meal.  . pramipexole (MIRAPEX) 1.5 MG tablet Take 1.5 mg by mouth daily.  . RABEprazole (ACIPHEX) 20 MG tablet Take 1 tablet (20 mg total) by mouth daily.  . sacubitril-valsartan (ENTRESTO) 49-51 MG Take 1 tablet by mouth 2 (two) times daily.  [name] spironolactone (ALDACTONE) 25 MG tablet Take 12.5 mg by  mouth daily.  [name] umeclidinium-vilanterol (ANORO ELLIPTA) 62.5-25 MCG/INH AEPB Inhale 1 puff into the lungs daily.  Carlena Hurl 20 MG TABS tablet TAKE 1 TABLET DAILY WITH SUPPER  . [DISCONTINUED] digoxin (LANOXIN) 0.125 MG tablet Take 1 tablet (0.125 mg total) by mouth daily.     Allergies:   Penicillins, Ace inhibitors, Darvocet [propoxyphene n-acetaminophen], and Sulfa antibiotics   Social History   Socioeconomic History  . Marital status: Married    Spouse name: Not on file  . Number of children: 2  . Years of education: Not on file  . Highest education level: Not on file  Occupational History  . Occupation: Tree surgeon: RETIRED  Tobacco Use  . Smoking status: Former Smoker    Packs/day: 2.00    Years: 30.00    Pack years: 60.00    Types: Cigarettes    Quit date: 01/22/1992    Years since quitting: 28.0  . Smokeless tobacco: Never Used  Vaping Use  . Vaping Use: Never used  Substance and Sexual Activity  . Alcohol use: No  . Drug use: No  . Sexual activity: Yes  Other Topics Concern  . Not on file  Social History Narrative  . Not on file   Social Determinants of Health   Financial Resource Strain: Not on file  Food Insecurity: Not on file  Transportation  Needs: Not on file  Physical Activity: Not on file  Stress: Not on file  Social Connections: Not on file     Family History: The patient's family history includes Cancer in his brother; Down syndrome in his son; Heart failure in his father.  ROS:   Please see the history of present illness.     All other systems reviewed and are negative.  EKGs/Labs/Other Studies Reviewed:    The following studies were reviewed today:  Echo 04/14/2019 1. Left ventricular ejection fraction, by estimation, is 35 to 40%. The  left ventricle has moderately decreased function. The left ventricle  demonstrates global hypokinesis. Left ventricular diastolic parameters are  indeterminate.  2. Right ventricular systolic function is mildly reduced. The right  ventricular size is normal. There is normal pulmonary artery systolic  pressure. The estimated right ventricular systolic pressure is 27.2 mmHg.  3. The mitral valve is normal in structure. Trivial mitral valve  regurgitation. No evidence of mitral stenosis.  4. The aortic valve is tricuspid. Aortic valve regurgitation is trivial.  Mild aortic valve sclerosis is present, with no evidence of aortic valve  stenosis.  5. Aortic dilatation noted. There is mild dilatation of the aortic root  measuring 37 mm.  6. The inferior vena cava is normal in size with greater than 50%  respiratory variability, suggesting right atrial pressure of 3 mmHg.  7. The patient was in atrial flutter.   EKG:  EKG is not ordered today.    Recent Labs: 08/27/2019: ALT 13; BUN 15; Creatinine 0.90; Hemoglobin 12.3; Platelet Count 106; Potassium 4.2; Sodium 140  Recent Lipid Panel    Component Value Date/Time   CHOL 108 10/16/2017 1435   TRIG 93 12/24/2018 1134   HDL 42 10/16/2017 1435   LDLCALC 55 10/16/2017 1435     Risk Assessment/Calculations:       Physical Exam:    VS:  BP 122/74   Pulse 78   Ht 5\' 10"  (1.778 m)   Wt 199 lb 9.6 oz (90.5 kg)   SpO2  97%   BMI 28.64 kg/m  Wt Readings from Last 3 Encounters:  01/28/20 199 lb 9.6 oz (90.5 kg)  10/28/19 201 lb 6.4 oz (91.4 kg)  10/19/19 200 lb 9.6 oz (91 kg)     GEN:  Well nourished, well developed in no acute distress HEENT: Normal NECK: No JVD; No carotid bruits LYMPHATICS: No lymphadenopathy CARDIAC: RRR, no murmurs, rubs, gallops RESPIRATORY:  Clear to auscultation without rales, wheezing or rhonchi  ABDOMEN: Soft, non-tender, non-distended MUSCULOSKELETAL:  No edema; No deformity  SKIN: Warm and dry NEUROLOGIC:  Alert and oriented x 3 PSYCHIATRIC:  Normal affect   ASSESSMENT:    1. Coronary artery disease involving coronary bypass graft of native heart without angina pectoris   2. Essential hypertension   3. Hyperlipidemia LDL goal <70   4. Chronic systolic CHF (congestive heart failure) ([place])   5. Atrial flutter, unspecified type ([place])    PLAN:    In order of problems listed above:  1. CAD s/p CABG: Denies any chest pain  2. Hypertension: Blood pressure stable  3. Hyperlipidemia: On Vytorin  4. Chronic systolic heart failure: Euvolemic on exam  5. Atrial flutter: He was started on digoxin due to elevated heart rate in December 2020 in the setting of COVID-19 infection.  Since his heart rate has been stable, I recommended discontinuation of digoxin.  Maintaining sinus rhythm based on physical exam.  Continue Xarelto.  During the previous visit, his metoprolol succinate was decreased to 50 mg daily, however patient actually decreased the metoprolol succinate to 25 mg daily.        Medication Adjustments/Labs and Tests Ordered: Current medicines are reviewed at length with the patient today.  Concerns regarding medicines are outlined above.  No orders of the defined types were placed in this encounter.  No orders of the defined types were placed in this encounter.   Patient Instructions  Medication Instructions:   STOP Digoxin *If you need a refill on  your cardiac medications before your next appointment, please call your pharmacy*  Lab Work: NONE ordered at this time of appointment   If you have labs (blood work) drawn today and your tests are completely normal, you will receive your results only by: [name] MyChart Message (if you have MyChart) OR . A paper copy in the mail If you have any lab test that is abnormal or we need to change your treatment, we will call you to review the results.  Testing/Procedures: NONE ordered at this time of appointment   Follow-Up: At [facility], you and your health needs are our priority.  As part of our continuing mission to provide you with exceptional heart care, we have created designated Provider Care Teams.  These Care Teams include your primary Cardiologist (physician) and Advanced Practice Providers (APPs -  Physician Assistants and Nurse Practitioners) who all work together to provide you with the care you need, when you need it.  Your next appointment:   6 month(s)  The format for your next appointment:   In Person  Provider:   [name], MD  Other Instructions      Signed, Almyra Deforest, [place]  01/30/2020 6:56 PM    [place]

## 2020-01-28 NOTE — Patient Instructions (Signed)
Medication Instructions:   STOP Digoxin *If you need a refill on your cardiac medications before your next appointment, please call your pharmacy*  Lab Work: NONE ordered at this time of appointment   If you have labs (blood work) drawn today and your tests are completely normal, you will receive your results only by: [name] MyChart Message (if you have MyChart) OR . A paper copy in the mail If you have any lab test that is abnormal or we need to change your treatment, we will call you to review the results.  Testing/Procedures: NONE ordered at this time of appointment   Follow-Up: At [facility], you and your health needs are our priority.  As part of our continuing mission to provide you with exceptional heart care, we have created designated Provider Care Teams.  These Care Teams include your primary Cardiologist (physician) and Advanced Practice Providers (APPs -  Physician Assistants and Nurse Practitioners) who all work together to provide you with the care you need, when you need it.  Your next appointment:   6 month(s)  The format for your next appointment:   In Person  Provider:   [name], MD  Other Instructions

## 2020-01-30 ENCOUNTER — Encounter: Payer: Self-pay | Admitting: Physician Assistant

## 2020-02-11 ENCOUNTER — Other Ambulatory Visit: Payer: Self-pay | Admitting: Cardiology

## 2020-02-11 NOTE — Telephone Encounter (Signed)
Prescription refill request for Xarelto received.  Indication: atrial fibrillation Last office visit:1/22  Meng Weight:90.5 kg Age:75 Scr: 0.9 8/21 CrCl: 90.78 ml/min  Prescription refilled

## 2020-02-21 ENCOUNTER — Other Ambulatory Visit: Payer: Self-pay | Admitting: Cardiology

## 2020-02-28 ENCOUNTER — Other Ambulatory Visit: Payer: Self-pay | Admitting: Cardiology

## 2020-03-06 ENCOUNTER — Telehealth: Payer: Self-pay | Admitting: *Deleted

## 2020-03-06 DIAGNOSIS — G4733 Obstructive sleep apnea (adult) (pediatric): Secondary | ICD-10-CM

## 2020-03-06 NOTE — Telephone Encounter (Signed)
I called [employer] and spoke with Brad new, he advised that they needed an order specifying that the CPAP machine had been recalled/patient received a letter and that the CPAP machine needed to be replaced.  New order sent to [employer].  Patient updated.

## 2020-03-06 NOTE — Telephone Encounter (Signed)
Called and spoke with Leroy Sea New regarding CPAP machine (recalled), regarding new CPAP machine.  Advised that a new order is needed specifying that the CPAP machine has been recalled and the CPAP machine needs to be replaced.  New order sent.  Message sent to patient to update on situation.

## 2020-03-06 NOTE — Telephone Encounter (Signed)
Please check with DME company as to when the replacement is expected to be available.  He has been waiting for a long time for the replacement  If he cannot get it sooner than options include stopping the CPAP for now as he has very mild apnea and he may be okay without treatment for a bit or consider a dental device for treatment of mild sleep apnea.

## 2020-03-06 NOTE — Telephone Encounter (Signed)
Please advise on patient mychart message  Received notification from [facility], that my machine is on recall because of two issues. :1.) PE-PUR polyurethane ( PE-PUR) sound abatement foam used in [place]. The following are the 2 reasons they list for recall.   1.) "PE-PUR foam may degrade into particles which may enter the device's air pathway and be ingested or inhaled by the user," and  2.)" the PE-PUR foam may off-gas certain chemicals."        OPTIONS (according to [place]) "The immediate action to be taken by You, the User:  a. stopping use of your device b, Continuing to use your affected device, IF your health care provider determines that the benefits outweigh the risks. c. Using another similar device that is not part of the recall." Please advise what route I need to take in this matter.  Thanks Rodney Burns

## 2020-03-16 DIAGNOSIS — H401131 Primary open-angle glaucoma, bilateral, mild stage: Secondary | ICD-10-CM | POA: Diagnosis not present

## 2020-03-16 DIAGNOSIS — H0102B Squamous blepharitis left eye, upper and lower eyelids: Secondary | ICD-10-CM | POA: Diagnosis not present

## 2020-03-16 DIAGNOSIS — H0102A Squamous blepharitis right eye, upper and lower eyelids: Secondary | ICD-10-CM | POA: Diagnosis not present

## 2020-04-19 DIAGNOSIS — D696 Thrombocytopenia, unspecified: Secondary | ICD-10-CM | POA: Diagnosis not present

## 2020-04-19 DIAGNOSIS — R7301 Impaired fasting glucose: Secondary | ICD-10-CM | POA: Diagnosis not present

## 2020-04-19 DIAGNOSIS — I10 Essential (primary) hypertension: Secondary | ICD-10-CM | POA: Diagnosis not present

## 2020-04-19 DIAGNOSIS — I2510 Atherosclerotic heart disease of native coronary artery without angina pectoris: Secondary | ICD-10-CM | POA: Diagnosis not present

## 2020-04-19 DIAGNOSIS — E785 Hyperlipidemia, unspecified: Secondary | ICD-10-CM | POA: Diagnosis not present

## 2020-04-19 DIAGNOSIS — Z125 Encounter for screening for malignant neoplasm of prostate: Secondary | ICD-10-CM | POA: Diagnosis not present

## 2020-04-20 DIAGNOSIS — Z0000 Encounter for general adult medical examination without abnormal findings: Secondary | ICD-10-CM | POA: Diagnosis not present

## 2020-04-20 DIAGNOSIS — Z1389 Encounter for screening for other disorder: Secondary | ICD-10-CM | POA: Diagnosis not present

## 2020-04-21 DIAGNOSIS — J8410 Pulmonary fibrosis, unspecified: Secondary | ICD-10-CM | POA: Diagnosis not present

## 2020-04-21 DIAGNOSIS — C9110 Chronic lymphocytic leukemia of B-cell type not having achieved remission: Secondary | ICD-10-CM | POA: Diagnosis not present

## 2020-04-21 DIAGNOSIS — D696 Thrombocytopenia, unspecified: Secondary | ICD-10-CM | POA: Diagnosis not present

## 2020-04-21 DIAGNOSIS — I10 Essential (primary) hypertension: Secondary | ICD-10-CM | POA: Diagnosis not present

## 2020-04-21 DIAGNOSIS — E785 Hyperlipidemia, unspecified: Secondary | ICD-10-CM | POA: Diagnosis not present

## 2020-04-21 DIAGNOSIS — I2510 Atherosclerotic heart disease of native coronary artery without angina pectoris: Secondary | ICD-10-CM | POA: Diagnosis not present

## 2020-04-21 DIAGNOSIS — R7301 Impaired fasting glucose: Secondary | ICD-10-CM | POA: Diagnosis not present

## 2020-05-22 ENCOUNTER — Telehealth: Payer: Self-pay | Admitting: Hematology and Oncology

## 2020-05-22 NOTE — Telephone Encounter (Signed)
Scheduled appt per 4/28 sch msg. Mailed updated calendar to pt.

## 2020-06-29 DIAGNOSIS — Z23 Encounter for immunization: Secondary | ICD-10-CM | POA: Diagnosis not present

## 2020-07-06 ENCOUNTER — Other Ambulatory Visit: Payer: Self-pay

## 2020-07-06 ENCOUNTER — Encounter: Payer: Self-pay | Admitting: Pulmonary Disease

## 2020-07-06 ENCOUNTER — Ambulatory Visit (INDEPENDENT_AMBULATORY_CARE_PROVIDER_SITE_OTHER): Payer: Medicare Other | Admitting: Pulmonary Disease

## 2020-07-06 VITALS — BP 116/62 | HR 59 | Temp 97.1°F | Ht 70.5 in | Wt 198.0 lb

## 2020-07-06 DIAGNOSIS — G4733 Obstructive sleep apnea (adult) (pediatric): Secondary | ICD-10-CM | POA: Diagnosis not present

## 2020-07-06 DIAGNOSIS — J849 Interstitial pulmonary disease, unspecified: Secondary | ICD-10-CM

## 2020-07-06 DIAGNOSIS — I25810 Atherosclerosis of coronary artery bypass graft(s) without angina pectoris: Secondary | ICD-10-CM

## 2020-07-06 DIAGNOSIS — U099 Post covid-19 condition, unspecified: Secondary | ICD-10-CM

## 2020-07-06 NOTE — Progress Notes (Signed)
Rodney Burns    [id number]    23-Aug-1945  Primary Care Physician:Wong, Edwyna Shell, MD  Referring Physician: Hulan Fess, MD [place],  [place] [id number]  Chief complaint: Follow-up for post COVID-19 pulmonary fibrosis, OSA  HPI: 75 y.o. male with PMHx of CLL, chronic atrial fibrillation-status post ablation-on Xarelto, history of CAD-s/p CABG 3335, chronic systolic heart failure, HTN, HLD.    Specialized from 12/3 to 12/30/2018 with COVID-19 pneumonia.  He was treated with steroids and convalescent plasma.  He did not get remdesivir or Actemra due to severe transaminitis.  Discharged on 2 L oxygen Continues to have significant dyspnea on exertion.  Follow-up chest x-ray 12/28 with worsening bilateral infiltrates and was referred to pulmonary for further evaluation.  He has history of CLL, currently on monitoring under the care of Dr. 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  No mold, hot tub, Jacuzzi.  No feather pillows or comforters  Smoking history: 60-pack-year smoker.  Quit in 1994 Travel history: No significant recent travel history Relevant family history: No significant family history of lung disease  Interim history: Supposed to be on CPAP titrated to 14 cm of water.  However it appears that he still on AutoSet CPAP 5-20 Feels that the pressure is uncomfortable and wakes him up at night He is awaiting replacement of recalled CPAP equipment  Continues to have mild dyspnea on exertion which is unchanged  Outpatient Encounter Medications as of 07/06/2020  Medication Sig   albuterol (VENTOLIN HFA) 108 (90 Base) MCG/ACT inhaler Inhale 1 puff into the lungs every 6 (six) hours as needed for wheezing or shortness of breath.   Docusate Calcium (STOOL SOFTENER PO) Take by mouth 2 (two) times daily.   ENTRESTO 49-51 MG TAKE 1 TABLET TWICE A DAY   ezetimibe-simvastatin (VYTORIN)  10-20 MG tablet TAKE 1 TABLET AT BEDTIME (KEEP UPCOMING APPOINTMENT FOR REFILLS)   Fiber, Guar Gum, CHEW Chew 3 capsules by mouth daily.    latanoprost (XALATAN) 0.005 % ophthalmic solution Place 1 drop into both eyes at bedtime.   Magnesium 250 MG TABS Take 500 mg by mouth at bedtime.   metoprolol succinate (TOPROL-XL) 50 MG 24 hr tablet TAKE 1 TABLET IN THE MORNING AND ONE-HALF (1/2) TABLET IN THE EVENING   pramipexole (MIRAPEX) 1.5 MG tablet Take 1.5 mg by mouth daily.   RABEprazole (ACIPHEX) 20 MG tablet Take 1 tablet (20 mg total) by mouth daily.   spironolactone (ALDACTONE) 25 MG tablet Take 12.5 mg by mouth daily.   umeclidinium-vilanterol (ANORO ELLIPTA) 62.5-25 MCG/INH AEPB Inhale 1 puff into the lungs daily.   XARELTO 20 MG TABS tablet TAKE 1 TABLET DAILY WITH SUPPER   No facility-administered encounter medications on file as of 07/06/2020.   Physical Exam: Blood pressure 116/62, pulse (!) 59, temperature (!) 97.1 F (36.2 C), temperature source Temporal, height 5' 10.5" (1.791 m), weight 198 lb (89.8 kg), SpO2 100 %. Gen:      No acute distress HEENT:  EOMI, sclera anicteric Neck:     No masses; no thyromegaly Lungs:    Clear to auscultation bilaterally; normal respiratory effort CV:         Regular rate and rhythm; no murmurs Abd:      + bowel sounds; soft, non-tender; no palpable masses, no distension Ext:    No edema; adequate peripheral perfusion Skin:  Warm and dry; no rash Neuro: alert and oriented x 3 Psych: normal mood and affect   Data Reviewed: Imaging: Chest x-ray 12/26/2018-bilateral interstitial prominence. Chest x-ray 01/18/2019-progression of reticulonodular opacities. High-resolution CT 02/03/2019-moderate pulmonary fibrosis with basal pattern with septal thickening, mild traction bronchiectasis.  No honeycombing.  Probable UIP High-res CT 10/26/2019-stable pulmonary fibrosis in probable UIP pattern I have reviewed the images  personally.  PFTs: 03/29/2019 FVC 3.32 [34%], FEV1 2.59 [80%],/78, TLC 5.78 [79%], DLCO 14.25 [54%] Minimal restriction with moderate diffusion defect.  mMRC  02/10/2019- 3 03/16/2019-3  Sleep: PSG 08/03/2011 Very mild OSA with AHI 9, desats to 86%, significant leg movements suspicious for PLM  Overnight oximetry 02/16/2019 Time less than 88% - 15 minutes, 32 seconds Nadir O2 sat of 83%. Oxygen desaturation index 27.5  CPAP titration 05/05/2019 Trial CPAP therapy on 14 cmH2O. 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 Currently on AutoSet and may be getting higher pressures than needed  We will change him to 14 cm of water based on titration study last year  If he continues to have issues with insomnia then will need referral to one of our sleep specialists  Ex-smoker Continue Anoro  Plan/Recommendations: - Anoro inhaler - CPAP, 14 cmH2O - Follow-up high-res CT in 4 months  Marshell Garfinkel MD [facility] 07/06/2020, 11:52 AM  CC: Hulan Fess, MD  Addendum CPAP download 10/17/2019 100% usage with average of 6 hours per night Auto CPAP, mean pressure 7.7, residual AHI 15.9  Shaely Gadberry MD [facility] Please see [email or web address] for pager details.  07/06/2020, 11:52 AM

## 2020-07-06 NOTE — Patient Instructions (Signed)
We will change your CPAP setting to 14 cm of water Schedule high-res CT in 5 months for follow-up of interstitial lung disease Follow-up in clinic after CT scan

## 2020-08-01 DIAGNOSIS — L821 Other seborrheic keratosis: Secondary | ICD-10-CM | POA: Diagnosis not present

## 2020-08-01 DIAGNOSIS — L720 Epidermal cyst: Secondary | ICD-10-CM | POA: Diagnosis not present

## 2020-08-01 DIAGNOSIS — D1801 Hemangioma of skin and subcutaneous tissue: Secondary | ICD-10-CM | POA: Diagnosis not present

## 2020-08-01 DIAGNOSIS — L570 Actinic keratosis: Secondary | ICD-10-CM | POA: Diagnosis not present

## 2020-08-08 DIAGNOSIS — L720 Epidermal cyst: Secondary | ICD-10-CM | POA: Diagnosis not present

## 2020-08-21 DIAGNOSIS — L720 Epidermal cyst: Secondary | ICD-10-CM | POA: Diagnosis not present

## 2020-08-28 ENCOUNTER — Other Ambulatory Visit: Payer: Self-pay | Admitting: *Deleted

## 2020-08-28 DIAGNOSIS — C9110 Chronic lymphocytic leukemia of B-cell type not having achieved remission: Secondary | ICD-10-CM

## 2020-08-28 NOTE — Assessment & Plan Note (Signed)
CLL stage I, lymphocytosis and lymphadenopathy diagnosed 07/31/2009, initial CT scan revealed lymphadenopathy and mildly enlarged spleen.  Lab review: Patient had COVID-19 pneumonia   Treatment plan: Observation. Interestingly his white count and lymphocyte count have decreased since the COVID-19 infection.  I discussed with him the indications for treatment would be rapid doubling time on development of severe anemia thrombocytopenia, rapidly enlarging lymphadenopathy, B symptoms like fevers chills night sweats and weight loss.  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  Return to clinic in1 yearfor follow-up with blood tests.

## 2020-08-28 NOTE — Progress Notes (Signed)
Patient Care Team: [name], MD as PCP - General (Family Medicine) Martinique, [name], MD as PCP - Cardiology (Cardiology)  DIAGNOSIS:    ICD-10-CM   1. CLL (chronic lymphocytic leukemia) ([place])  C91.10       CHIEF COMPLIANT: Follow-up of CLL  INTERVAL HISTORY: Rodney Burns is a 75 y.o. with above-mentioned history of CLL. He reports to the clinic today for follow-up.  He reports no new problems or concerns.  He is still continuing to recover from prior COVID infection with trouble breathing.  He is mostly recovered.  He does feel fatigued.  He denies any fevers night sweats or weight loss.  ALLERGIES:  is allergic to penicillins, ace inhibitors, darvocet [propoxyphene n-acetaminophen], and sulfa antibiotics.  MEDICATIONS:  Current Outpatient Medications  Medication Sig Dispense Refill   albuterol (VENTOLIN HFA) 108 (90 Base) MCG/ACT inhaler Inhale 1 puff into the lungs every 6 (six) hours as needed for wheezing or shortness of breath.     Docusate Calcium (STOOL SOFTENER PO) Take by mouth 2 (two) times daily.     ENTRESTO 49-51 MG TAKE 1 TABLET TWICE A DAY 180 tablet 3   ezetimibe-simvastatin (VYTORIN) 10-20 MG tablet TAKE 1 TABLET AT BEDTIME (KEEP UPCOMING APPOINTMENT FOR REFILLS) 90 tablet 3   Fiber, Guar Gum, CHEW Chew 3 capsules by mouth daily.      latanoprost (XALATAN) 0.005 % ophthalmic solution Place 1 drop into both eyes at bedtime.     Magnesium 250 MG TABS Take 500 mg by mouth at bedtime.     metoprolol succinate (TOPROL-XL) 50 MG 24 hr tablet TAKE 1 TABLET IN THE MORNING AND ONE-HALF (1/2) TABLET IN THE EVENING 135 tablet 3   pramipexole (MIRAPEX) 1.5 MG tablet Take 1.5 mg by mouth daily.     RABEprazole (ACIPHEX) 20 MG tablet Take 1 tablet (20 mg total) by mouth daily. 90 tablet 3   spironolactone (ALDACTONE) 25 MG tablet Take 12.5 mg by mouth daily.     umeclidinium-vilanterol (ANORO ELLIPTA) 62.5-25 MCG/INH AEPB Inhale 1 puff into the lungs daily. 180 each 3    XARELTO 20 MG TABS tablet TAKE 1 TABLET DAILY WITH SUPPER 90 tablet 1   No current facility-administered medications for this visit.    PHYSICAL EXAMINATION: ECOG PERFORMANCE STATUS: 1 - Symptomatic but completely ambulatory  Vitals:   08/29/20 0846  BP: 108/63  Pulse: 88  Resp: 18  Temp: (!) 97.5 F (36.4 C)  SpO2: 97%   Filed Weights   08/29/20 0846  Weight: 206 lb 9.6 oz (93.7 kg)    LABORATORY DATA:  I have reviewed the data as listed CMP Latest Ref Rng & Units 08/27/2019 01/18/2019 12/30/2018  Glucose 70 - 99 mg/dL 104(H) 145(H) 81  BUN 8 - 23 mg/dL 15 13 30(H)  Creatinine 0.61 - 1.24 mg/dL 0.90 0.77 0.87  Sodium 135 - 145 mmol/L 140 137 136  Potassium 3.5 - 5.1 mmol/L 4.2 4.0 4.9  Chloride 98 - 111 mmol/L 108 105 101  CO2 22 - 32 mmol/L '25 23 26  '$ Calcium 8.9 - 10.3 mg/dL 9.6 8.5(L) 8.5(L)  Total Protein 6.5 - 8.1 g/dL 6.6 6.2(L) 6.1(L)  Total Bilirubin 0.3 - 1.2 mg/dL 0.9 0.7 1.3(H)  Alkaline Phos 38 - 126 U/L 35(L) 52 45  AST 15 - 41 U/L 15 22 69(H)  ALT 0 - 44 U/L 13 67(H) 216(H)    Lab Results  Component Value Date   WBC 15.8 (H) 08/29/2020  HGB 13.5 08/29/2020   HCT 42.8 08/29/2020   MCV 88.1 08/29/2020   PLT 98 (L) 08/29/2020   NEUTROABS PENDING 08/29/2020    ASSESSMENT & PLAN:  CLL (chronic lymphocytic leukemia) (HCC) CLL stage I, lymphocytosis and lymphadenopathy diagnosed 07/31/2009, initial CT scan revealed lymphadenopathy and mildly enlarged spleen.   Lab review:  Patient had COVID-19 pneumonia: He is still recovering from that.   Treatment plan: Observation. Continue to watch his blood counts were his platelets have dropped down to 98.   I discussed with him the indications for treatment would be rapid doubling time on development of severe anemia thrombocytopenia, rapidly enlarging lymphadenopathy, B symptoms like fevers chills night sweats and weight loss.   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  His platelet count today is 98  which is slightly worse than before.  Therefore I would like to repeat blood work in 3 months with labs and follow-up.      No orders of the defined types were placed in this encounter.  The patient has a good understanding of the overall plan. he agrees with it. he will call with any problems that may develop before the next visit here.  Total time spent: 30 mins including face to face time and time spent for planning, charting and coordination of care  Rulon Eisenmenger, MD, MPH 08/29/2020  I, Thana Ates, am acting as scribe for Dr. Nicholas Lose.  I have reviewed the above documentation for accuracy and completeness, and I agree with the above.

## 2020-08-29 ENCOUNTER — Inpatient Hospital Stay: Payer: Medicare Other

## 2020-08-29 ENCOUNTER — Inpatient Hospital Stay: Payer: Medicare Other | Attending: Hematology and Oncology | Admitting: Hematology and Oncology

## 2020-08-29 ENCOUNTER — Other Ambulatory Visit: Payer: Self-pay

## 2020-08-29 DIAGNOSIS — Z8616 Personal history of COVID-19: Secondary | ICD-10-CM | POA: Diagnosis not present

## 2020-08-29 DIAGNOSIS — C9110 Chronic lymphocytic leukemia of B-cell type not having achieved remission: Secondary | ICD-10-CM | POA: Diagnosis not present

## 2020-08-29 DIAGNOSIS — Z79899 Other long term (current) drug therapy: Secondary | ICD-10-CM | POA: Diagnosis not present

## 2020-08-29 LAB — CMP (CANCER CENTER ONLY)
ALT: 22 U/L (ref 0–44)
AST: 18 U/L (ref 15–41)
Albumin: 4.1 g/dL (ref 3.5–5.0)
Alkaline Phosphatase: 33 U/L — ABNORMAL LOW (ref 38–126)
Anion gap: 8 (ref 5–15)
BUN: 20 mg/dL (ref 8–23)
CO2: 26 mmol/L (ref 22–32)
Calcium: 9.4 mg/dL (ref 8.9–10.3)
Chloride: 108 mmol/L (ref 98–111)
Creatinine: 0.94 mg/dL (ref 0.61–1.24)
GFR, Estimated: >60 mL/min (ref >60)
Glucose, Bld: 111 mg/dL — ABNORMAL HIGH (ref 70–99)
Potassium: 4.3 mmol/L (ref 3.5–5.1)
Sodium: 142 mmol/L (ref 135–145)
Total Bilirubin: 0.8 mg/dL (ref 0.3–1.2)
Total Protein: 6.3 g/dL — ABNORMAL LOW (ref 6.5–8.1)

## 2020-08-29 LAB — CBC WITH DIFFERENTIAL (CANCER CENTER ONLY)
Abs Immature Granulocytes: 0.02 10*3/uL (ref 0.00–0.07)
Basophils Absolute: 0 10*3/uL (ref 0.0–0.1)
Basophils Relative: 0 %
Eosinophils Absolute: 0.1 10*3/uL (ref 0.0–0.5)
Eosinophils Relative: 1 %
HCT: 42.8 % (ref 39.0–52.0)
Hemoglobin: 13.5 g/dL (ref 13.0–17.0)
Immature Granulocytes: 0 %
Lymphocytes Relative: 70 %
Lymphs Abs: 11.1 10*3/uL — ABNORMAL HIGH (ref 0.7–4.0)
MCH: 27.8 pg (ref 26.0–34.0)
MCHC: 31.5 g/dL (ref 30.0–36.0)
MCV: 88.1 fL (ref 80.0–100.0)
Monocytes Absolute: 0.8 10*3/uL (ref 0.1–1.0)
Monocytes Relative: 5 %
Neutro Abs: 3.8 10*3/uL (ref 1.7–7.7)
Neutrophils Relative %: 24 %
Platelet Count: 98 10*3/uL — ABNORMAL LOW (ref 150–400)
RBC: 4.86 MIL/uL (ref 4.22–5.81)
RDW: 15.3 % (ref 11.5–15.5)
WBC Count: 15.8 10*3/uL — ABNORMAL HIGH (ref 4.0–10.5)
nRBC: 0 % (ref 0.0–0.2)

## 2020-08-29 LAB — LACTATE DEHYDROGENASE: LDH: 141 U/L (ref 98–192)

## 2020-09-05 DIAGNOSIS — G5602 Carpal tunnel syndrome, left upper limb: Secondary | ICD-10-CM | POA: Diagnosis not present

## 2020-09-14 DIAGNOSIS — H401131 Primary open-angle glaucoma, bilateral, mild stage: Secondary | ICD-10-CM | POA: Diagnosis not present

## 2020-09-14 DIAGNOSIS — D3132 Benign neoplasm of left choroid: Secondary | ICD-10-CM | POA: Diagnosis not present

## 2020-09-14 DIAGNOSIS — H35033 Hypertensive retinopathy, bilateral: Secondary | ICD-10-CM | POA: Diagnosis not present

## 2020-09-14 DIAGNOSIS — H26493 Other secondary cataract, bilateral: Secondary | ICD-10-CM | POA: Diagnosis not present

## 2020-09-17 ENCOUNTER — Other Ambulatory Visit: Payer: Self-pay | Admitting: Cardiology

## 2020-09-17 NOTE — Telephone Encounter (Signed)
PM please advise. Thanks  Aug 27th :On the latest results from my 'Dream Mapper' (Cpap) It is showing I had 7 total obstructive Apneas & 226 Total Hypopneas on 8/25. It showed I used machine for 6:29 hrs & that I had an AHI of 35.9.  Should I be concerned?? FYI: I included the readings for this month. AHI reading for Aug 1-25th: 8/1=12.8           8/8=23.9          8/15=16.8         8/22=37.7 8/2=15.1           8/9=34.6          8/16=24.4         8/23=32.4       8/3=13.4           8/10=32.8       8/17=28.9          8/24=37 8/4=12               8/11=6             8/18=31.3          8/25=35.9 8/5=10.5           8/12=7             8/19=35.8 8/6=10.5           8/13=16.5       8/20=16.3 8/7=15.3           8/14=20.2       8/21+24

## 2020-09-18 NOTE — Telephone Encounter (Signed)
What are the setting on the CPAP? The recorded AHI is higher than expected and may mean his OSA is not being treated, Can you please make an appointment with one of our sleep docs to evaluate? Thanks.

## 2020-09-20 DIAGNOSIS — G5602 Carpal tunnel syndrome, left upper limb: Secondary | ICD-10-CM | POA: Diagnosis not present

## 2020-09-28 DIAGNOSIS — G5602 Carpal tunnel syndrome, left upper limb: Secondary | ICD-10-CM | POA: Diagnosis not present

## 2020-10-23 ENCOUNTER — Encounter: Payer: Self-pay | Admitting: Pulmonary Disease

## 2020-10-23 ENCOUNTER — Ambulatory Visit (INDEPENDENT_AMBULATORY_CARE_PROVIDER_SITE_OTHER): Payer: Medicare Other | Admitting: Pulmonary Disease

## 2020-10-23 ENCOUNTER — Other Ambulatory Visit: Payer: Self-pay | Admitting: Cardiology

## 2020-10-23 ENCOUNTER — Other Ambulatory Visit: Payer: Self-pay

## 2020-10-23 VITALS — BP 104/70 | HR 71 | Temp 97.8°F | Ht 70.0 in | Wt 207.4 lb

## 2020-10-23 DIAGNOSIS — E785 Hyperlipidemia, unspecified: Secondary | ICD-10-CM | POA: Diagnosis not present

## 2020-10-23 DIAGNOSIS — R7301 Impaired fasting glucose: Secondary | ICD-10-CM | POA: Diagnosis not present

## 2020-10-23 DIAGNOSIS — G4733 Obstructive sleep apnea (adult) (pediatric): Secondary | ICD-10-CM

## 2020-10-23 DIAGNOSIS — Z23 Encounter for immunization: Secondary | ICD-10-CM | POA: Diagnosis not present

## 2020-10-23 DIAGNOSIS — D696 Thrombocytopenia, unspecified: Secondary | ICD-10-CM | POA: Diagnosis not present

## 2020-10-23 NOTE — Progress Notes (Signed)
Rodney Burns    [id number]    01-28-1945  Primary Care Physician:Wong, Edwyna Shell, MD  Referring Physician: [name], MD [address] [place],  [place] [address]  Chief complaint:   Patient with history of obstructive sleep apnea  HPI:  Elevated AHI A lot of mask leaks Has tried a fullface and now a nasal mask  He does not sleep through the night, easy to fall asleep but wakes up often Not very sleepy during the day May fall asleep inadvertently  Usually goes to bed about 10 PM 5 minutes to fall asleep Multiple awakenings Final wake up time after 4 AM  Weight is stable  Symptoms are overall better since he started using CPAP about a year and a half ago He was initially on an auto titrating CPAP and recently changed to CPAP of 14  History of obstructive lung disease, history of COVID History of coronary artery disease, heart failure History of CLL  Outpatient Encounter Medications as of 10/23/2020  Medication Sig   albuterol (VENTOLIN HFA) 108 (90 Base) MCG/ACT inhaler Inhale 1 puff into the lungs every 6 (six) hours as needed for wheezing or shortness of breath.   Docusate Calcium (STOOL SOFTENER PO) Take by mouth 2 (two) times daily.   ENTRESTO 49-51 MG TAKE 1 TABLET TWICE A DAY   ezetimibe-simvastatin (VYTORIN) 10-20 MG tablet TAKE 1 TABLET AT BEDTIME (KEEP UPCOMING APPOINTMENT FOR REFILLS)   Fiber, Guar Gum, CHEW Chew 3 capsules by mouth daily.    latanoprost (XALATAN) 0.005 % ophthalmic solution Place 1 drop into both eyes at bedtime.   Magnesium 250 MG TABS Take 500 mg by mouth at bedtime.   metoprolol succinate (TOPROL-XL) 50 MG 24 hr tablet TAKE 1 TABLET IN THE MORNING AND ONE-HALF (1/2) TABLET IN THE EVENING   pramipexole (MIRAPEX) 1.5 MG tablet Take 1.5 mg by mouth daily.   RABEprazole (ACIPHEX) 20 MG tablet Take 1 tablet (20 mg total) by mouth daily.   rivaroxaban (XARELTO) 20 MG TABS tablet TAKE 1 TABLET DAILY WITH SUPPER   spironolactone  (ALDACTONE) 25 MG tablet Take 0.5 tablets (12.5 mg total) by mouth daily.   umeclidinium-vilanterol (ANORO ELLIPTA) 62.5-25 MCG/INH AEPB Inhale 1 puff into the lungs daily.   No facility-administered encounter medications on file as of 10/23/2020.    Allergies as of 10/23/2020 - Review Complete 10/23/2020  Allergen Reaction Noted   Penicillins Other (See Comments) 08/16/2010   Ace inhibitors Cough 08/16/2010   Darvocet [propoxyphene n-acetaminophen] Rash 08/16/2010   Sulfa antibiotics Rash 09/02/2011    Past Medical History:  Diagnosis Date   Atrial tachycardia [facility])    a. s/p DCCV 09/2009; b. s/p RFCA 03/2011; c. s/p DCCV 04/2011; d. s/p RFCA 09/17/11.   Cataract    Chronic systolic CHF (congestive heart failure) ([place])    a. 01/2015 Echo: EF 40-45%, antsept/infsept HK, triv AI, mild MR, mod dil LA, nl RV, mildly dil RA.   CLL (chronic lymphoblastic leukemia)    Stage 0-1   Coronary artery disease involving native coronary artery without angina pectoris    a. Status post CABG in 1994:By Dr. Cyndia Bent. LIMA - L Cx, seqSVG-Diag-LAD, and SVG-PDA-PL.   Cough    Consistant with ACE inhibitor mediated cough   Dysrhythmia    Glaucoma (increased eye pressure) 1991   History of tobacco abuse    quit 1994   Hypercholesterolemia    Well Controlled   Hypertension  Ischemic cardiomyopathy    a. 02/2011 Echo: EF 40-45%;  b. 01/2015 Echo: EF 40-45%.   Malaria 1972   Hx of   Pericarditis    a. 01/2015-->Treated w/ ibuprofen/colchicine.   Sleep-disordered breathing 06/20/2011    Past Surgical History:  Procedure Laterality Date   ATRIAL FLUTTER ABLATION N/A 04/11/2011   Procedure: ATRIAL FLUTTER ABLATION;  Surgeon: Deboraha Sprang, MD;  Location: [facility] CATH LAB;  Service: Cardiovascular;  Laterality: N/A;   ATRIAL FLUTTER ABLATION N/A 09/17/2011   Procedure: ATRIAL FLUTTER ABLATION;  Surgeon: Thompson Grayer, MD;  Location: [facility] CATH LAB;  Service: Cardiovascular;  Laterality: N/A;   CARDIOVERSION   06/03/2011   Procedure: CARDIOVERSION;  Surgeon: Deboraha Sprang, MD;  Location: [facility];  Service: Cardiovascular;  Laterality: N/A;   CATARACT EXTRACTION     [place]   By Dr. Cyndia Bent. LIMA - L Cx, seqSVG-Diag-LAD, and SVG-PDA-PL   ELECTROPHYSIOLOGY STUDY N/A 04/11/2011   Procedure: ELECTROPHYSIOLOGY STUDY;  Surgeon: Deboraha Sprang, MD;  Location: [facility] CATH LAB;  Service: Cardiovascular;  Laterality: N/A;   EP study and ablation  2013   by Dr Caryl Comes, repeat ablation by Dr Rayann Heman   NM MYOVIEW LTD  03/2014   scar in the inferior and anteroapical regions without ischemia. This is similar to finding on Myoview in 2013. EF is a little lower 39%>>32%.   TRIGGER FINGER RELEASE  12/2016   left hand    US ECHOCARDIOGRAPHY  09-07-09; 02/2011   a. Est EF 40-45%; b. EF 40-45%. Gr 2 DD. Mild LA dil    Family History  Problem Relation Age of Onset   Heart failure Father    Cancer Brother        colon   Down syndrome Son     Social History   Socioeconomic History   Marital status: Married    Spouse name: Not on file   Number of children: 2   Years of education: Not on file   Highest education level: Not on file  Occupational History   Occupation: Careers adviser: RETIRED  Tobacco Use   Smoking status: Former    Packs/day: 2.00    Years: 30.00    Pack years: 60.00    Types: Cigarettes    Quit date: 01/22/1992    Years since quitting: 28.7   Smokeless tobacco: Never  Vaping Use   Vaping Use: Never used  Substance and Sexual Activity   Alcohol use: No   Drug use: No   Sexual activity: Yes  Other Topics Concern   Not on file  Social History Narrative   Not on file   Social Determinants of Health   Financial Resource Strain: Not on file  Food Insecurity: Not on file  Transportation Needs: Not on file  Physical Activity: Not on file  Stress: Not on file  Social Connections: Not on file  Intimate Partner Violence: Not on file    Review of Systems   Respiratory:  Positive for apnea and shortness of breath.   Psychiatric/Behavioral:  Positive for sleep disturbance.    Vitals:   10/23/20 1431  BP: 104/70  Pulse: 71  Temp: 97.8 F (36.6 C)  SpO2: 96%     Physical Exam Constitutional:      Appearance: Normal appearance.  HENT:     Head: Normocephalic.     Mouth/Throat:     Mouth: Mucous membranes are moist.     Comments: Macroglossia  Cardiovascular:     Rate and Rhythm: Normal rate and regular rhythm.     Heart sounds: No murmur heard.   No friction rub.  Pulmonary:     Effort: No respiratory distress.     Breath sounds: No stridor. No wheezing or rhonchi.  Musculoskeletal:     Cervical back: No rigidity or tenderness.  Neurological:     Mental Status: He is alert.   Data Reviewed: Download from machine does reveal excellent compliance of 100% residual AHI of 25  Machine set at 14   Assessment:  Obstructive sleep apnea He has mild obstructive sleep apnea with AHI of 9 -Good compliance -Suboptimal treatment with elevated AHI  Continues to have mask issues  Outside mask issues CPAP induced central apneas-the download from the machine is not consistent with centrals  Probable UIP -Post COVID  On Anoro for obstructive lung disease  Plan/Recommendations:  Continue using CPAP on a nightly basis  Will send prescription into DME company for change of CPAP mask  Nasal steroid for nasal stuffiness and congestion  Tentative follow-up in 4 to 6 weeks or with a download  Way forward will still be finding the best fitting mask  Encouraged to call with any significant issues, if he were to start experiencing pressure issues as well   Sherrilyn Rist MD [facility] 10/23/2020, 3:03 PM  CC: [name], MD

## 2020-10-23 NOTE — Patient Instructions (Signed)
DME referral  CPAP supplies -Nasal mask or different fullface mask  Call if you are having any pressure issues  I will see you in about 6 weeks  Nasal steroids may help with nasal congestion-examples will include Flonase, Nasonex-each about 2 sprays each nostril daily  Call with any other significant concerns

## 2020-10-23 NOTE — Telephone Encounter (Signed)
Prescription refill request for Xarelto received.  Indication:afib Last office visit:meng 01/28/20 Weight:93.7kg Age:18m Scr:0.94 08/29/20 CrCl:88.6

## 2020-10-27 DIAGNOSIS — E785 Hyperlipidemia, unspecified: Secondary | ICD-10-CM | POA: Diagnosis not present

## 2020-10-27 DIAGNOSIS — I2510 Atherosclerotic heart disease of native coronary artery without angina pectoris: Secondary | ICD-10-CM | POA: Diagnosis not present

## 2020-10-27 DIAGNOSIS — J8410 Pulmonary fibrosis, unspecified: Secondary | ICD-10-CM | POA: Diagnosis not present

## 2020-10-27 DIAGNOSIS — R7301 Impaired fasting glucose: Secondary | ICD-10-CM | POA: Diagnosis not present

## 2020-10-27 DIAGNOSIS — I10 Essential (primary) hypertension: Secondary | ICD-10-CM | POA: Diagnosis not present

## 2020-10-27 DIAGNOSIS — C9110 Chronic lymphocytic leukemia of B-cell type not having achieved remission: Secondary | ICD-10-CM | POA: Diagnosis not present

## 2020-10-27 DIAGNOSIS — D696 Thrombocytopenia, unspecified: Secondary | ICD-10-CM | POA: Diagnosis not present

## 2020-10-31 DIAGNOSIS — G5602 Carpal tunnel syndrome, left upper limb: Secondary | ICD-10-CM | POA: Diagnosis not present

## 2020-11-13 ENCOUNTER — Other Ambulatory Visit (HOSPITAL_BASED_OUTPATIENT_CLINIC_OR_DEPARTMENT_OTHER): Payer: Medicare Other | Admitting: Pulmonary Disease

## 2020-11-15 ENCOUNTER — Other Ambulatory Visit: Payer: Self-pay

## 2020-11-15 ENCOUNTER — Ambulatory Visit (HOSPITAL_BASED_OUTPATIENT_CLINIC_OR_DEPARTMENT_OTHER): Payer: Medicare Other | Attending: Pulmonary Disease | Admitting: Pulmonary Disease

## 2020-11-15 DIAGNOSIS — G4733 Obstructive sleep apnea (adult) (pediatric): Secondary | ICD-10-CM

## 2020-11-27 ENCOUNTER — Other Ambulatory Visit: Payer: Self-pay | Admitting: Pulmonary Disease

## 2020-11-27 DIAGNOSIS — R0602 Shortness of breath: Secondary | ICD-10-CM

## 2020-11-27 DIAGNOSIS — J1282 Pneumonia due to coronavirus disease 2019: Secondary | ICD-10-CM

## 2020-11-27 DIAGNOSIS — U071 COVID-19: Secondary | ICD-10-CM

## 2020-11-27 NOTE — Progress Notes (Signed)
Patient Care Team: [name], MD as PCP - General (Family Medicine) Martinique, [name], MD as PCP - Cardiology (Cardiology)  DIAGNOSIS:    ICD-10-CM   1. CLL (chronic lymphocytic leukemia) (HCC)  C91.10 CBC with Differential ([facility] Only)    CMP ([facility] only)    Lactate dehydrogenase      CHIEF COMPLIANT: Follow-up of CLL  INTERVAL HISTORY: Rodney Burns is a 75 y.o. with above-mentioned history of CLL. He reports to the clinic today for follow-up.  Apart from respiratory symptoms and sleep apnea related issues he is doing quite well.  He denies any fevers chills night sweats.  He lost some weight during COVID-19 which he is trying to stay away from gaining it all back.  ALLERGIES:  is allergic to penicillins, ace inhibitors, darvocet [propoxyphene n-acetaminophen], and sulfa antibiotics.  MEDICATIONS:  Current Outpatient Medications  Medication Sig Dispense Refill   albuterol (VENTOLIN HFA) 108 (90 Base) MCG/ACT inhaler Inhale 1 puff into the lungs every 6 (six) hours as needed for wheezing or shortness of breath.     ANORO ELLIPTA 62.5-25 MCG/ACT AEPB USE 1 INHALATION DAILY 360 each 3   Docusate Calcium (STOOL SOFTENER PO) Take by mouth 2 (two) times daily.     ENTRESTO 49-51 MG TAKE 1 TABLET TWICE A DAY 180 tablet 3   ezetimibe-simvastatin (VYTORIN) 10-20 MG tablet TAKE 1 TABLET AT BEDTIME (KEEP UPCOMING APPOINTMENT FOR REFILLS) 90 tablet 3   Fiber, Guar Gum, CHEW Chew 3 capsules by mouth daily.      latanoprost (XALATAN) 0.005 % ophthalmic solution Place 1 drop into both eyes at bedtime.     Magnesium 250 MG TABS Take 500 mg by mouth at bedtime.     metoprolol succinate (TOPROL-XL) 50 MG 24 hr tablet TAKE 1 TABLET IN THE MORNING AND ONE-HALF (1/2) TABLET IN THE EVENING 135 tablet 3   pramipexole (MIRAPEX) 1.5 MG tablet Take 1.5 mg by mouth daily.     RABEprazole (ACIPHEX) 20 MG tablet Take 1 tablet (20 mg total) by mouth daily. 90 tablet 3   rivaroxaban  (XARELTO) 20 MG TABS tablet TAKE 1 TABLET DAILY WITH SUPPER 90 tablet 1   spironolactone (ALDACTONE) 25 MG tablet Take 0.5 tablets (12.5 mg total) by mouth daily. 45 tablet 1   No current facility-administered medications for this visit.    PHYSICAL EXAMINATION: ECOG PERFORMANCE STATUS: 1 - Symptomatic but completely ambulatory  Vitals:   11/28/20 0851  BP: (!) 103/59  Pulse: 62  Resp: 18  Temp: (!) 97.3 F (36.3 C)  SpO2: 97%   Filed Weights   11/28/20 0851  Weight: 209 lb 3.2 oz (94.9 kg)    LABORATORY DATA:  I have reviewed the data as listed CMP Latest Ref Rng & Units 11/28/2020 08/29/2020 08/27/2019  Glucose 70 - 99 mg/dL 87 111(H) 104(H)  BUN 8 - 23 mg/dL 17 20 15   Creatinine 0.61 - 1.24 mg/dL 1.01 0.94 0.90  Sodium 135 - 145 mmol/L 142 142 140  Potassium 3.5 - 5.1 mmol/L 3.8 4.3 4.2  Chloride 98 - 111 mmol/L 108 108 108  CO2 22 - 32 mmol/L 25 26 25   Calcium 8.9 - 10.3 mg/dL 9.4 9.4 9.6  Total Protein 6.5 - 8.1 g/dL 6.7 6.3(L) 6.6  Total Bilirubin 0.3 - 1.2 mg/dL 1.4(H) 0.8 0.9  Alkaline Phos 38 - 126 U/L 39 33(L) 35(L)  AST 15 - 41 U/L 18 18 15   ALT 0 - 44  U/L 22 22 13     Lab Results  Component Value Date   WBC 15.8 (H) 11/28/2020   HGB 13.7 11/28/2020   HCT 44.0 11/28/2020   MCV 85.9 11/28/2020   PLT 102 (L) 11/28/2020   NEUTROABS PENDING 11/28/2020    ASSESSMENT & PLAN:  CLL (chronic lymphocytic leukemia) (HCC) CLL stage I, lymphocytosis and lymphadenopathy diagnosed 07/31/2009, initial CT scan revealed lymphadenopathy and mildly enlarged spleen.   Lab review:  Patient had COVID-19 pneumonia: He is still recovering from that.   Treatment plan: Observation. Lab review: WBC 15.8, platelets 102 (stable)   I discussed with him the indications for treatment would be rapid doubling time on development of severe anemia thrombocytopenia, rapidly enlarging lymphadenopathy, B symptoms like fevers chills night sweats and weight loss.   Chronic thrombocytopenia:  Probably low-grade ITP.  Platelet counts are stable at 102.  Return to clinic in 1 year with labs and follow-up   Orders Placed This Encounter  Procedures   CBC with Differential ([facility] Only)    Standing Status:   Future    Standing Expiration Date:   11/28/2021   CMP ([facility] only)    Standing Status:   Future    Standing Expiration Date:   11/28/2021   Lactate dehydrogenase    Standing Status:   Future    Standing Expiration Date:   11/28/2021    The patient has a good understanding of the overall plan. he agrees with it. he will call with any problems that may develop before the next visit here.  Total time spent: 20 mins including face to face time and time spent for planning, charting and coordination of care  Rulon Eisenmenger, MD, MPH 11/28/2020  I, Thana Ates, am acting as scribe for Dr. Nicholas Lose.  I have reviewed the above documentation for accuracy and completeness, and I agree with the above.

## 2020-11-28 ENCOUNTER — Other Ambulatory Visit: Payer: Self-pay

## 2020-11-28 ENCOUNTER — Inpatient Hospital Stay (HOSPITAL_BASED_OUTPATIENT_CLINIC_OR_DEPARTMENT_OTHER): Payer: Medicare Other | Admitting: Hematology and Oncology

## 2020-11-28 ENCOUNTER — Inpatient Hospital Stay: Payer: Medicare Other | Attending: Hematology and Oncology

## 2020-11-28 DIAGNOSIS — R634 Abnormal weight loss: Secondary | ICD-10-CM | POA: Insufficient documentation

## 2020-11-28 DIAGNOSIS — R0989 Other specified symptoms and signs involving the circulatory and respiratory systems: Secondary | ICD-10-CM | POA: Diagnosis not present

## 2020-11-28 DIAGNOSIS — R161 Splenomegaly, not elsewhere classified: Secondary | ICD-10-CM | POA: Insufficient documentation

## 2020-11-28 DIAGNOSIS — Z882 Allergy status to sulfonamides status: Secondary | ICD-10-CM | POA: Insufficient documentation

## 2020-11-28 DIAGNOSIS — Z8616 Personal history of COVID-19: Secondary | ICD-10-CM | POA: Diagnosis not present

## 2020-11-28 DIAGNOSIS — D696 Thrombocytopenia, unspecified: Secondary | ICD-10-CM | POA: Diagnosis not present

## 2020-11-28 DIAGNOSIS — Z7901 Long term (current) use of anticoagulants: Secondary | ICD-10-CM | POA: Insufficient documentation

## 2020-11-28 DIAGNOSIS — C9110 Chronic lymphocytic leukemia of B-cell type not having achieved remission: Secondary | ICD-10-CM | POA: Insufficient documentation

## 2020-11-28 DIAGNOSIS — G4730 Sleep apnea, unspecified: Secondary | ICD-10-CM | POA: Insufficient documentation

## 2020-11-28 DIAGNOSIS — Z79899 Other long term (current) drug therapy: Secondary | ICD-10-CM | POA: Insufficient documentation

## 2020-11-28 DIAGNOSIS — R591 Generalized enlarged lymph nodes: Secondary | ICD-10-CM | POA: Diagnosis not present

## 2020-11-28 DIAGNOSIS — Z880 Allergy status to penicillin: Secondary | ICD-10-CM | POA: Insufficient documentation

## 2020-11-28 LAB — CBC WITH DIFFERENTIAL (CANCER CENTER ONLY)
Abs Immature Granulocytes: 0 10*3/uL (ref 0.00–0.07)
Basophils Absolute: 0.2 10*3/uL — ABNORMAL HIGH (ref 0.0–0.1)
Basophils Relative: 1 %
Eosinophils Absolute: 0.2 10*3/uL (ref 0.0–0.5)
Eosinophils Relative: 1 %
HCT: 44 % (ref 39.0–52.0)
Hemoglobin: 13.7 g/dL (ref 13.0–17.0)
Lymphocytes Relative: 47 %
Lymphs Abs: 7.4 10*3/uL — ABNORMAL HIGH (ref 0.7–4.0)
MCH: 26.8 pg (ref 26.0–34.0)
MCHC: 31.1 g/dL (ref 30.0–36.0)
MCV: 85.9 fL (ref 80.0–100.0)
Monocytes Absolute: 0.6 10*3/uL (ref 0.1–1.0)
Monocytes Relative: 4 %
Neutro Abs: 7.4 10*3/uL (ref 1.7–7.7)
Neutrophils Relative %: 47 %
Platelet Count: 102 10*3/uL — ABNORMAL LOW (ref 150–400)
RBC: 5.12 MIL/uL (ref 4.22–5.81)
RDW: 14.4 % (ref 11.5–15.5)
Smear Review: NORMAL
WBC Count: 15.8 10*3/uL — ABNORMAL HIGH (ref 4.0–10.5)
nRBC: 0 % (ref 0.0–0.2)

## 2020-11-28 LAB — CMP (CANCER CENTER ONLY)
ALT: 22 U/L (ref 0–44)
AST: 18 U/L (ref 15–41)
Albumin: 4.5 g/dL (ref 3.5–5.0)
Alkaline Phosphatase: 39 U/L (ref 38–126)
Anion gap: 9 (ref 5–15)
BUN: 17 mg/dL (ref 8–23)
CO2: 25 mmol/L (ref 22–32)
Calcium: 9.4 mg/dL (ref 8.9–10.3)
Chloride: 108 mmol/L (ref 98–111)
Creatinine: 1.01 mg/dL (ref 0.61–1.24)
GFR, Estimated: >60 mL/min (ref >60)
Glucose, Bld: 87 mg/dL (ref 70–99)
Potassium: 3.8 mmol/L (ref 3.5–5.1)
Sodium: 142 mmol/L (ref 135–145)
Total Bilirubin: 1.4 mg/dL — ABNORMAL HIGH (ref 0.3–1.2)
Total Protein: 6.7 g/dL (ref 6.5–8.1)

## 2020-11-28 LAB — LACTATE DEHYDROGENASE: LDH: 168 U/L (ref 98–192)

## 2020-11-28 NOTE — Assessment & Plan Note (Signed)
CLL stage I, lymphocytosis and lymphadenopathy diagnosed 07/31/2009, initial CT scan revealed lymphadenopathy and mildly enlarged spleen.  Lab review: Patient had COVID-19 pneumonia: He is still recovering from that.  Treatment plan: Observation. Lab review:  I discussed with him the indications for treatment would be rapid doubling time on development of severe anemia thrombocytopenia, rapidly enlarging lymphadenopathy, B symptoms like fevers chills night sweats and weight loss.  Chronic thrombocytopenia: Probably low-grade ITP.His platelet count today is 98which is slightly worse than before.  Therefore I would like to repeat blood work in 3 months with labs and follow-up.

## 2020-12-04 ENCOUNTER — Ambulatory Visit (INDEPENDENT_AMBULATORY_CARE_PROVIDER_SITE_OTHER): Payer: Medicare Other | Admitting: Pulmonary Disease

## 2020-12-04 ENCOUNTER — Other Ambulatory Visit: Payer: Self-pay

## 2020-12-04 ENCOUNTER — Encounter: Payer: Self-pay | Admitting: Pulmonary Disease

## 2020-12-04 VITALS — BP 110/62 | HR 67 | Ht 70.0 in | Wt 204.0 lb

## 2020-12-04 DIAGNOSIS — G4733 Obstructive sleep apnea (adult) (pediatric): Secondary | ICD-10-CM

## 2020-12-04 DIAGNOSIS — Z9989 Dependence on other enabling machines and devices: Secondary | ICD-10-CM | POA: Diagnosis not present

## 2020-12-04 NOTE — Progress Notes (Signed)
Rodney Burns    [id number]    1945-05-29  Primary Care Physician:Wong, Edwyna Shell, MD  Referring Physician: [name], MD [address] [place],   [address]  Chief complaint:   Patient with history of obstructive sleep apnea  HPI:  Complaining of dryness Humidification chamber is dry whenever he wakes up in the morning  Feels mask changes have helped  Sleeps well Feels restored in the morning, the only concern is the dryness  Denies any other significant symptoms at present  Compliant with CPAP Weight is stable  Symptoms are overall better since he started using CPAP about a year and a half ago He was initially on an auto titrating CPAP and recently changed to CPAP of 14  History of obstructive lung disease, history of COVID History of coronary artery disease, heart failure History of CLL  Outpatient Encounter Medications as of 10/23/2020  Medication Sig   albuterol (VENTOLIN HFA) 108 (90 Base) MCG/ACT inhaler Inhale 1 puff into the lungs every 6 (six) hours as needed for wheezing or shortness of breath.   Docusate Calcium (STOOL SOFTENER PO) Take by mouth 2 (two) times daily.   ENTRESTO 49-51 MG TAKE 1 TABLET TWICE A DAY   ezetimibe-simvastatin (VYTORIN) 10-20 MG tablet TAKE 1 TABLET AT BEDTIME (KEEP UPCOMING APPOINTMENT FOR REFILLS)   Fiber, Guar Gum, CHEW Chew 3 capsules by mouth daily.    latanoprost (XALATAN) 0.005 % ophthalmic solution Place 1 drop into both eyes at bedtime.   Magnesium 250 MG TABS Take 500 mg by mouth at bedtime.   metoprolol succinate (TOPROL-XL) 50 MG 24 hr tablet TAKE 1 TABLET IN THE MORNING AND ONE-HALF (1/2) TABLET IN THE EVENING   pramipexole (MIRAPEX) 1.5 MG tablet Take 1.5 mg by mouth daily.   RABEprazole (ACIPHEX) 20 MG tablet Take 1 tablet (20 mg total) by mouth daily.   rivaroxaban (XARELTO) 20 MG TABS tablet TAKE 1 TABLET DAILY WITH SUPPER   spironolactone (ALDACTONE) 25 MG tablet Take 0.5 tablets (12.5 mg  total) by mouth daily.   umeclidinium-vilanterol (ANORO ELLIPTA) 62.5-25 MCG/INH AEPB Inhale 1 puff into the lungs daily.   No facility-administered encounter medications on file as of 10/23/2020.    Allergies as of 10/23/2020 - Review Complete 10/23/2020  Allergen Reaction Noted   Penicillins Other (See Comments) 08/16/2010   Ace inhibitors Cough 08/16/2010   Darvocet [propoxyphene n-acetaminophen] Rash 08/16/2010   Sulfa antibiotics Rash 09/02/2011    Past Medical History:  Diagnosis Date   Atrial tachycardia [facility])    a. s/p DCCV 09/2009; b. s/p RFCA 03/2011; c. s/p DCCV 04/2011; d. s/p RFCA 09/17/11.   Cataract    Chronic systolic CHF (congestive heart failure) ([place])    a. 01/2015 Echo: EF 40-45%, antsept/infsept HK, triv AI, mild MR, mod dil LA, nl RV, mildly dil RA.   CLL (chronic lymphoblastic leukemia)    Stage 0-1   Coronary artery disease involving native coronary artery without angina pectoris    a. Status post CABG in 1994:By Dr. Cyndia Bent. LIMA - L Cx, seqSVG-Diag-LAD, and SVG-PDA-PL.   Cough    Consistant with ACE inhibitor mediated cough   Dysrhythmia    Glaucoma (increased eye pressure) 1991   History of tobacco abuse    quit 1994   Hypercholesterolemia    Well Controlled   Hypertension    Ischemic cardiomyopathy    a. 02/2011 Echo: EF 40-45%;  b. 01/2015 Echo: EF 40-45%.  Malaria 1972   Hx of   Pericarditis    a. 01/2015-->Treated w/ ibuprofen/colchicine.   Sleep-disordered breathing 06/20/2011    Past Surgical History:  Procedure Laterality Date   ATRIAL FLUTTER ABLATION N/A 04/11/2011   Procedure: ATRIAL FLUTTER ABLATION;  Surgeon: Deboraha Sprang, MD;  Location: [facility] CATH LAB;  Service: Cardiovascular;  Laterality: N/A;   ATRIAL FLUTTER ABLATION N/A 09/17/2011   Procedure: ATRIAL FLUTTER ABLATION;  Surgeon: Thompson Grayer, MD;  Location: [facility] CATH LAB;  Service: Cardiovascular;  Laterality: N/A;   CARDIOVERSION  06/03/2011   Procedure: CARDIOVERSION;  Surgeon: Deboraha Sprang, MD;  Location: [place];  Service: Cardiovascular;  Laterality: N/A;   CATARACT EXTRACTION     [place]   By Dr. Cyndia Bent. LIMA - L Cx, seqSVG-Diag-LAD, and SVG-PDA-PL   ELECTROPHYSIOLOGY STUDY N/A 04/11/2011   Procedure: ELECTROPHYSIOLOGY STUDY;  Surgeon: Deboraha Sprang, MD;  Location: [facility] CATH LAB;  Service: Cardiovascular;  Laterality: N/A;   EP study and ablation  2013   by Dr Caryl Comes, repeat ablation by Dr Rayann Heman   NM MYOVIEW LTD  03/2014   scar in the inferior and anteroapical regions without ischemia. This is similar to finding on Myoview in 2013. EF is a little lower 39%>>32%.   TRIGGER FINGER RELEASE  12/2016   left hand    US ECHOCARDIOGRAPHY  09-07-09; 02/2011   a. Est EF 40-45%; b. EF 40-45%. Gr 2 DD. Mild LA dil    Family History  Problem Relation Age of Onset   Heart failure Father    Cancer Brother        colon   Down syndrome Son     Social History   Socioeconomic History   Marital status: Married    Spouse name: Not on file   Number of children: 2   Years of education: Not on file   Highest education level: Not on file  Occupational History   Occupation: Careers adviser: RETIRED  Tobacco Use   Smoking status: Former    Packs/day: 2.00    Years: 30.00    Pack years: 60.00    Types: Cigarettes    Quit date: 01/22/1992    Years since quitting: 28.7   Smokeless tobacco: Never  Vaping Use   Vaping Use: Never used  Substance and Sexual Activity   Alcohol use: No   Drug use: No   Sexual activity: Yes  Other Topics Concern   Not on file  Social History Narrative   Not on file   Social Determinants of Health   Financial Resource Strain: Not on file  Food Insecurity: Not on file  Transportation Needs: Not on file  Physical Activity: Not on file  Stress: Not on file  Social Connections: Not on file  Intimate Partner Violence: Not on file    Review of Systems  Respiratory:  Positive for apnea and shortness of breath.    Psychiatric/Behavioral:  Positive for sleep disturbance.    Vitals:   10/23/20 1431  BP: 104/70  Pulse: 71  Temp: 97.8 F (36.6 C)  SpO2: 96%     Physical Exam Constitutional:      Appearance: Normal appearance.  HENT:     Head: Normocephalic.     Mouth/Throat:     Mouth: Mucous membranes are moist.     Comments: Macroglossia Cardiovascular:     Rate and Rhythm: Normal rate and regular rhythm.     Heart  sounds: No murmur heard.   No friction rub.  Pulmonary:     Effort: No respiratory distress.     Breath sounds: No stridor. No wheezing or rhonchi.  Musculoskeletal:     Cervical back: No rigidity or tenderness.  Neurological:     Mental Status: He is alert.   Data Reviewed: Download not available today -On CPAP of 14  Assessment:  Obstructive sleep apnea He has mild obstructive sleep apnea with AHI of 9 -Good compliance -Suboptimal treatment with elevated AHI  Mask issues are better  Still waking up in the morning feeling bone dry -This probably has something to do with humidification settings  Probable UIP -Post COVID  On Anoro for obstructive lung disease  Plan/Recommendations:  Continue using CPAP on a nightly basis  Call DME company to adjust humidification settings  Nasal steroid for nasal stuffiness and congestion  Follow-up in 3 months  Encouraged to call with any significant issues, if he were to start experiencing pressure issues as well   Sherrilyn Rist MD [facility] 10/23/2020, 3:03 PM  CC: [name], MD

## 2020-12-04 NOTE — Patient Instructions (Signed)
For the dryness -Contact the medical supply company to walk you to out to adjust the humidification setting  If this does not work, having a humidifier set up in your room will help  Call us with significant concerns  I will see you in 3 months  Continue using CPAP on a nightly basis

## 2020-12-06 ENCOUNTER — Other Ambulatory Visit: Payer: Self-pay

## 2020-12-06 ENCOUNTER — Ambulatory Visit
Admission: RE | Admit: 2020-12-06 | Discharge: 2020-12-06 | Disposition: A | Discharge status: Home / Self Care | Payer: Medicare Other | Source: Ambulatory Visit | Attending: Pulmonary Disease | Admitting: Pulmonary Disease

## 2020-12-06 DIAGNOSIS — I2510 Atherosclerotic heart disease of native coronary artery without angina pectoris: Secondary | ICD-10-CM | POA: Diagnosis not present

## 2020-12-06 DIAGNOSIS — J8410 Pulmonary fibrosis, unspecified: Secondary | ICD-10-CM | POA: Diagnosis not present

## 2020-12-06 DIAGNOSIS — J849 Interstitial pulmonary disease, unspecified: Secondary | ICD-10-CM | POA: Diagnosis not present

## 2020-12-06 DIAGNOSIS — J432 Centrilobular emphysema: Secondary | ICD-10-CM | POA: Diagnosis not present

## 2020-12-18 ENCOUNTER — Other Ambulatory Visit: Payer: Self-pay | Admitting: *Deleted

## 2020-12-18 DIAGNOSIS — J849 Interstitial pulmonary disease, unspecified: Secondary | ICD-10-CM

## 2021-02-14 ENCOUNTER — Other Ambulatory Visit: Payer: Self-pay | Admitting: Cardiology

## 2021-02-21 ENCOUNTER — Other Ambulatory Visit: Payer: Self-pay | Admitting: Cardiology

## 2021-03-08 ENCOUNTER — Ambulatory Visit: Payer: Medicare Other | Admitting: Pulmonary Disease

## 2021-03-09 ENCOUNTER — Other Ambulatory Visit: Payer: Self-pay

## 2021-03-09 ENCOUNTER — Ambulatory Visit (INDEPENDENT_AMBULATORY_CARE_PROVIDER_SITE_OTHER): Payer: Medicare Other | Admitting: Nurse Practitioner

## 2021-03-09 ENCOUNTER — Encounter: Payer: Self-pay | Admitting: Nurse Practitioner

## 2021-03-09 ENCOUNTER — Ambulatory Visit (INDEPENDENT_AMBULATORY_CARE_PROVIDER_SITE_OTHER): Payer: Medicare Other

## 2021-03-09 ENCOUNTER — Telehealth: Payer: Self-pay | Admitting: Nurse Practitioner

## 2021-03-09 VITALS — BP 118/70 | HR 62 | Temp 98.3°F | Ht 70.0 in | Wt 219.0 lb

## 2021-03-09 DIAGNOSIS — J849 Interstitial pulmonary disease, unspecified: Secondary | ICD-10-CM | POA: Diagnosis not present

## 2021-03-09 DIAGNOSIS — J432 Centrilobular emphysema: Secondary | ICD-10-CM | POA: Diagnosis not present

## 2021-03-09 DIAGNOSIS — J310 Chronic rhinitis: Secondary | ICD-10-CM

## 2021-03-09 DIAGNOSIS — G4733 Obstructive sleep apnea (adult) (pediatric): Secondary | ICD-10-CM

## 2021-03-09 DIAGNOSIS — R053 Chronic cough: Secondary | ICD-10-CM | POA: Diagnosis not present

## 2021-03-09 DIAGNOSIS — R0602 Shortness of breath: Secondary | ICD-10-CM

## 2021-03-09 DIAGNOSIS — I5022 Chronic systolic (congestive) heart failure: Secondary | ICD-10-CM | POA: Diagnosis not present

## 2021-03-09 DIAGNOSIS — R0609 Other forms of dyspnea: Secondary | ICD-10-CM

## 2021-03-09 DIAGNOSIS — Z9989 Dependence on other enabling machines and devices: Secondary | ICD-10-CM | POA: Diagnosis not present

## 2021-03-09 LAB — BASIC METABOLIC PANEL
BUN: 15 mg/dL (ref 6–23)
CO2: 30 mEq/L (ref 19–32)
Calcium: 9.3 mg/dL (ref 8.4–10.5)
Chloride: 106 mEq/L (ref 96–112)
Creatinine, Ser: 0.93 mg/dL (ref 0.40–1.50)
GFR: 79.75 mL/min (ref >60.00)
Glucose, Bld: 106 mg/dL — ABNORMAL HIGH (ref 70–99)
Potassium: 4.4 mEq/L (ref 3.5–5.1)
Sodium: 139 mEq/L (ref 135–145)

## 2021-03-09 LAB — BRAIN NATRIURETIC PEPTIDE: Pro B Natriuretic peptide (BNP): 638 pg/mL — ABNORMAL HIGH (ref 0.0–100.0)

## 2021-03-09 MED ORDER — GUAIFENESIN ER 600 MG PO TB12: 600.0000 mg | ORAL_TABLET | Freq: Two times a day (BID) | ORAL | 6 refills | Status: DC

## 2021-03-09 MED ORDER — FLUTICASONE PROPIONATE 50 MCG/ACT NA SUSP: 2.0000 | Freq: Every day | NASAL | 3 refills | Status: DC

## 2021-03-09 MED ORDER — LORATADINE 10 MG PO TABS: 10.0000 mg | ORAL_TABLET | Freq: Every day | ORAL | 6 refills | Status: DC

## 2021-03-09 NOTE — Progress Notes (Addendum)
Attempted to contact pt to notify of lab results. BNP has increased since last check, which could indicate worsening heart failure. Given this, his worsening DOE, and his BLE edema, I would advise he follow up with cardiology ASAP. I do not want to add additional diuretic, such as lasix, since he is on Entresto. Thanks.

## 2021-03-09 NOTE — Assessment & Plan Note (Signed)
Worsening progressive DOE. Suspect multifactorial r/t poorly controlled OSA, systolic CHF, IPF, and obesity. Repeat PFTs ordered today for further eval for restrictive or obstructive lung disease. Recent HRCT showed stable fibrotic changes. BNP and BMET today given HF and increase LE edema.

## 2021-03-09 NOTE — Assessment & Plan Note (Addendum)
Suspect multifactorial r/t emphysema, IPF with mild bronchiolectasis, upper airway irriation. Optimize postnasal drainage control with initiation of Claritin, flonase and mucinex. CXR today to r/o underlying etiology - no acute process noted; slight improved coarsened pulmonary parenchyma.

## 2021-03-09 NOTE — Assessment & Plan Note (Addendum)
With worsening DOE and LE edema, will recheck BNP and BMET today. May need addition of lasix to diuretic regimen. Follow up with cardiology.

## 2021-03-09 NOTE — Assessment & Plan Note (Addendum)
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 Recent HRCT stable when compared to previous. Repeat PFTs to evaluate for any restrictive lung disease. Walking oximetry ordered today

## 2021-03-09 NOTE — Assessment & Plan Note (Signed)
Former PFTs without evidence of obstruction but moderate diffusion defect. Will repeat for eval of progression of disease. Continue Anoro daily in interim.

## 2021-03-09 NOTE — Progress Notes (Addendum)
@Patient  ID: Rodney Burns, male    DOB: 1945-06-06, 76 y.o.   MRN: [id number]  Chief Complaint  Patient presents with   Follow-up    He reports that he is not sure he is doing well with cpap.     Referring provider: [name], MD  HPI: 76 year old male, former smoker (60 pack year hx) followed for OSA on CPAP and interstitial pulmonary disease (probable UIP) post COVID. He is followed by Dr. [name] for ILD and Dr. Ander Slade for OSA. He was last seen in office on 12/04/2020 by Dr. Ander Slade. Past medical history significant for HTN, ischemic cardiomyopathy, pericarditis, a fib on Xarelto, chronic systolic CHF, HLD, CLL, Hx of CABG x5. He is followed by Dr. [name] for monitoring of CLL.   TEST/EVENTS:  05/05/2019 CPAP tiration: AHI 22.8/hr, SpO2 low 85%. Optimal setting 14 cmH2O 10/19/2019 PFTs: FVC 4.01 (91), FEV1 3.08 (96), ratio 77, TLC 84%, DLCOunc 55%. No BD. Overall normal spirometry with moderate diffusion defect 12/06/2020 HRCT: Coronary artery calcifications s/p CABG. Atherosclerosis. Numerous nonenlarged mediastinal nodes, unchanged. Mild b/l air trapping. Centrilobular and paraseptal emphysema. Subpleural predominate reticular and ground-glass opacities within mid and lower lung predominance. Mild traction bronchiolectasis. Stable solid pulmonary nodule RLL 6 mm, likely fibrosis. Categorized as probable UIP/ post COVID fibrosis.  12/04/2020: OV with Dr Ander Slade for sleep f/u. Complaining of dryness with humidification chamber dry in AM. Sleeps well w/o any other complaints on CPAP 14 cmH2O. Advised to contact DME for troubleshooting. Fluticasone nasal spray. Continued on Anoro for obstructive lung disease.   03/09/2021: Today - follow up Patient presents today for follow up regarding CPAP therapy. He reports that his wife says he's having issues with his machine and that it is noisy at night. He tends to sleep through it and does not feel like he has to wake frequently to adjust it. His  download that he brought today shows significant mask leaks, despite the new mask he has, and high AHIs ranging from 10's-50/hr. He averages about 5 hours and 48 min a night, which is usually about how long he sleeps for. He recently started taking melatonin which has helped with lengthening his sleep. He denies drowsy driving or narcolepsy. He feels as though he sleeps well at night but still has some daytime fatigue. He uses a nasal mask.  His breathing is progressively worsening over the last few months, worse since his last visit. He feels as though he doesn't tolerate activity as well as he used to and gets short of breath more easily with exertion. He has an occasional cough with clear sputum production and nasal congestion/drainage. He does note that he has intermittent swelling in both of his lower extremities, usually at the end of the day or if he sits for an extended period. He continues on his Anoro inhaler. He does not use his albuterol very often due to him feeling like it doesn't help.   Allergies  Allergen Reactions   Penicillins Other (See Comments)    Put pt in hospital 50 yrs ago   Ace Inhibitors Cough   Darvocet [Propoxyphene N-Acetaminophen] Rash    Tolerates tylenol   Sulfa Antibiotics Rash    Immunization History  Administered Date(s) Administered   Fluad Quad(high Dose 65+) 10/23/2020   Influenza Split 11/23/2015   Influenza, High Dose Seasonal PF 10/27/2013, 11/03/2014, 11/23/2015, 10/26/2016, 09/28/2018   Influenza,inj,Quad PF,6+ Mos 11/07/2010, 10/22/2012, 10/23/2020   Influenza-Unspecified 11/21/2013, 11/04/2016   PFIZER(Purple Top)SARS-COV-2 Vaccination  04/02/2019, 04/22/2019, 09/21/2019   Pneumococcal Conjugate-13 02/23/2016   Pneumococcal Polysaccharide-23 11/18/2011   Tdap 03/05/2011    Past Medical History:  Diagnosis Date   Atrial tachycardia [facility])    a. s/p DCCV 09/2009; b. s/p RFCA 03/2011; c. s/p DCCV 04/2011; d. s/p RFCA 09/17/11.   Cataract     Chronic systolic CHF (congestive heart failure) ([place])    a. 01/2015 Echo: EF 40-45%, antsept/infsept HK, triv AI, mild MR, mod dil LA, nl RV, mildly dil RA.   CLL (chronic lymphoblastic leukemia)    Stage 0-1   Coronary artery disease involving native coronary artery without angina pectoris    a. Status post CABG in 1994:By Dr. Cyndia Bent. LIMA - L Cx, seqSVG-Diag-LAD, and SVG-PDA-PL.   Cough    Consistant with ACE inhibitor mediated cough   Dysrhythmia    Glaucoma (increased eye pressure) 1991   History of tobacco abuse    quit 1994   Hypercholesterolemia    Well Controlled   Hypertension    Ischemic cardiomyopathy    a. 02/2011 Echo: EF 40-45%;  b. 01/2015 Echo: EF 40-45%.   Malaria 1972   Hx of   Pericarditis    a. 01/2015-->Treated w/ ibuprofen/colchicine.   Sleep-disordered breathing 06/20/2011    Tobacco History: Social History   Tobacco Use  Smoking Status Former   Packs/day: 2.00   Years: 30.00   Pack years: 60.00   Types: Cigarettes   Quit date: 01/22/1992   Years since quitting: 29.1  Smokeless Tobacco Never   Counseling given: Not Answered   Outpatient Medications Prior to Visit  Medication Sig Dispense Refill   Melatonin 5 MG CHEW Chew 1 capsule by mouth daily.     albuterol (VENTOLIN HFA) 108 (90 Base) MCG/ACT inhaler Inhale 1 puff into the lungs every 6 (six) hours as needed for wheezing or shortness of breath.     ANORO ELLIPTA 62.5-25 MCG/ACT AEPB USE 1 INHALATION DAILY 360 each 3   Docusate Calcium (STOOL SOFTENER PO) Take by mouth 2 (two) times daily.     ENTRESTO 49-51 MG TAKE 1 TABLET TWICE A DAY 180 tablet 3   ezetimibe-simvastatin (VYTORIN) 10-20 MG tablet TAKE 1 TABLET AT BEDTIME (KEEP UPCOMING APPOINTMENT FOR REFILLS) 90 tablet 3   Fiber, Guar Gum, CHEW Chew 3 capsules by mouth daily.      latanoprost (XALATAN) 0.005 % ophthalmic solution Place 1 drop into both eyes at bedtime.     Magnesium 250 MG TABS Take 500 mg by mouth at bedtime.     metoprolol  succinate (TOPROL-XL) 50 MG 24 hr tablet TAKE 1 TABLET IN THE MORNING AND ONE-HALF (1/2) TABLET IN THE EVENING 135 tablet 3   pramipexole (MIRAPEX) 1.5 MG tablet Take 1.5 mg by mouth daily.     RABEprazole (ACIPHEX) 20 MG tablet Take 1 tablet (20 mg total) by mouth daily. 90 tablet 3   rivaroxaban (XARELTO) 20 MG TABS tablet TAKE 1 TABLET DAILY WITH SUPPER 90 tablet 1   spironolactone (ALDACTONE) 25 MG tablet Take 0.5 tablets (12.5 mg total) by mouth daily. 45 tablet 1   No facility-administered medications prior to visit.     Review of Systems:   Constitutional: No weight loss or gain, night sweats, fevers, chills. +daytime fatigue HEENT: No headaches, difficulty swallowing, tooth/dental problems, or sore throat. No sneezing, itching, ear ache. +nasal congestion, post nasal drip, rhinorrhea CV:  +swelling in lower extremities. No chest pain, orthopnea, PND, anasarca, dizziness, palpitations, syncope Resp: +shortness of  breath with exertion; occasional productive cough (clear sputum). No excess mucus or change in color of mucus. No hemoptysis. No wheezing.  No chest wall deformity GI:  No heartburn, indigestion, abdominal pain, nausea, vomiting, diarrhea, change in bowel habits, loss of appetite, bloody stools.  GU: No dysuria, change in color of urine, urgency or frequency.  No flank pain, no hematuria  Skin: No rash, lesions, ulcerations MSK:  No joint pain or swelling.  No decreased range of motion.  No back pain. Neuro: No dizziness or lightheadedness.  Psych: No depression or anxiety. Mood stable.     Physical Exam:  BP 118/70 (BP Location: Left Arm, Patient Position: Sitting, Cuff Size: Normal)    Pulse 62    Temp 98.3 F (36.8 C) (Oral)    Ht 5\' 10"  (1.778 m)    Wt 219 lb (99.3 kg)    SpO2 98%    BMI 31.42 kg/m   GEN: Pleasant, interactive, well-appearing; obese; in no acute distress. HEENT:  Normocephalic and atraumatic. EACs patent bilaterally. TM pearly gray with present  light reflex bilaterally. PERRLA. Sclera white. Nasal turbinates pink, moist and patent bilaterally. Clear rhinorrhea present. Oropharynx erythematous and moist, without exudate or edema. No lesions, ulcerations, or postnasal drip.  NECK:  Supple w/ fair ROM. No JVD present. Normal carotid impulses w/o bruits. Thyroid symmetrical with no goiter or nodules palpated. No lymphadenopathy.   CV: RRR, no m/r/g, no peripheral edema. Pulses intact, +2 bilaterally. No cyanosis, pallor or clubbing. PULMONARY:  Unlabored, regular breathing. Minimal rales b/l bases; otherwise clear bilaterally A&P. No accessory muscle use. No dullness to percussion. GI: BS present and normoactive. Soft, non-tender to palpation. No organomegaly or masses detected. No CVA tenderness. MSK: No erythema, warmth or tenderness. Cap refil <2 sec all extrem. No deformities or joint swelling noted.  Neuro: A/Ox3. No focal deficits noted.   Skin: Warm, no lesions or rashe Psych: Normal affect and behavior. Judgement and thought content appropriate.     Lab Results:  CBC    Component Value Date/Time   WBC 15.8 (H) 11/28/2020 0838   WBC 30.2 (H) 12/30/2018 0040   RBC 5.12 11/28/2020 0838   HGB 13.7 11/28/2020 0838   HGB 13.8 08/22/2016 0932   HCT 44.0 11/28/2020 0838   HCT 44.1 08/22/2016 0932   PLT 102 (L) 11/28/2020 0838   PLT 128 (L) 08/22/2016 0932   MCV 85.9 11/28/2020 0838   MCV 86.0 08/22/2016 0932   MCH 26.8 11/28/2020 0838   MCHC 31.1 11/28/2020 0838   RDW 14.4 11/28/2020 0838   RDW 14.3 08/22/2016 0932   LYMPHSABS 7.4 (H) 11/28/2020 0838   LYMPHSABS 16.6 (H) 08/22/2016 0932   MONOABS 0.6 11/28/2020 0838   MONOABS 0.3 08/22/2016 0932   EOSABS 0.2 11/28/2020 0838   EOSABS 0.1 08/22/2016 0932   BASOSABS 0.2 (H) 11/28/2020 0838   BASOSABS 0.0 08/22/2016 0932    BMET    Component Value Date/Time   NA 139 03/09/2021 1104   NA 140 11/06/2017 0842   NA 141 08/22/2016 0932   K 4.4 03/09/2021 1104   K 4.5  08/22/2016 0932   CL 106 03/09/2021 1104   CL 105 01/02/2012 0938   CO2 30 03/09/2021 1104   CO2 28 08/22/2016 0932   GLUCOSE 106 (H) 03/09/2021 1104   GLUCOSE 98 08/22/2016 0932   GLUCOSE 97 01/02/2012 0938   BUN 15 03/09/2021 1104   BUN 13 11/06/2017 0842   BUN 16.6 08/22/2016 0932  CREATININE 0.93 03/09/2021 1104   CREATININE 1.01 11/28/2020 0838   CREATININE 0.9 08/22/2016 0932   CALCIUM 9.3 03/09/2021 1104   CALCIUM 9.5 08/22/2016 0932   GFRNONAA >60 11/28/2020 0838   GFRAA >60 08/27/2019 0846    BNP    Component Value Date/Time   BNP 208.8 (H) 12/29/2018 0450     Imaging:  03/09/2021: CXR reviewed by me. No acute process. Chronic, slightly improved ILD  DG Chest 2 View  Result Date: 03/09/2021 CLINICAL DATA:  Post COVID fibrosis, worsening shortness of breath on exertion. EXAM: CHEST - 2 VIEW COMPARISON:  01/18/2019 and CT chest 12/06/2020. FINDINGS: Trachea is midline. Heart size stable. There is a coarsened pulmonary parenchymal pattern with overall slight improvement from 01/18/2019. No superimposed airspace consolidation or pleural fluid. IMPRESSION: 1. No acute findings. 2. Underlying chronic interstitial lung disease, better evaluated on CT chest 12/06/2020. Electronically Signed   By: Lorin Picket M.D.   On: 03/09/2021 11:15      PFT Results Latest Ref Rng & Units 10/19/2019 03/29/2019  FVC-Pre L 3.98 3.31  FVC-Predicted Pre % 90 74  FVC-Post L 4.01 3.32  FVC-Predicted Post % 91 74  Pre FEV1/FVC % % 77 76  Post FEV1/FCV % % 77 78  FEV1-Pre L 3.07 2.52  FEV1-Predicted Pre % 96 77  FEV1-Post L 3.08 2.59  DLCO uncorrected ml/min/mmHg 14.26 14.25  DLCO UNC% % 55 54  DLCO corrected ml/min/mmHg 14.26 14.21  DLCO COR %Predicted % 55 54  DLVA Predicted % 62 69  TLC L 6.12 5.78  TLC % Predicted % 84 79  RV % Predicted % 84 85    No results found for: NITRICOXIDE      Assessment & Plan:   OSA (obstructive sleep apnea) Compliant with usage. Poorly  controlled on current CPAP settings and significant leaks with current mask, dating back to time of mask change. Desensitization mask fitting ordered. Will change to auto set 10-20 cmH2O given breakthrough events.   Patient Instructions  Change CPAP to auto 10-20cmH2O every night, minimum of 4-6 hours a night.  Change equipment every 30 days or as directed by DME. Wash your tubing with warm soap and water daily, hang to dry. Wash humidifier portion weekly.  Maintain clean equipment, as directed by home health agency.  Be aware of reduced alertness and do not drive or operate heavy machinery if experiencing this or drowsiness.  Exercise encouraged, as tolerated. Healthy weight management discussed.  Avoid or decrease alcohol consumption and medications that make you more sleepy, if possible. Notify if persistent daytime sleepiness occurs even with consistent use of CPAP.   Continue Anoro 1 puff daily  Continue Albuterol inhaler 2 puffs every 6 hours as needed for shortness of breath or wheezing. Notify if symptoms persist despite rescue inhaler/neb use.  -Claritin 10 mg daily -Flonase 2 sprays each nostril daily  -Mucinex 600 mg Twice daily for chest congestion and nasal drainage  Pulmonary function testing scheduled today.  Labs today - BNP, BMET  Chest x ray. We will notify you of any abnormal results.   Take CPAP machine to [facility] to have formal download   Follow up after PFTs with Dr. [name], Dr. Ander Slade or Katie Nieve Rojero,NP. If symptoms do not improve or worsen, please contact office for sooner follow up or seek emergency care.    Shortness of breath Worsening progressive DOE. Suspect multifactorial r/t poorly controlled OSA, systolic CHF, IPF, and obesity. Repeat PFTs ordered today for further eval  for restrictive or obstructive lung disease. Recent HRCT showed stable fibrotic changes. BNP and BMET today given HF and increase LE edema.   Interstitial pulmonary disease ([place]) Post  COVID fibrosis with probable UIP pattern. Recent HRCT stable when compared to previous. Repeat PFTs to evaluate for any restrictive lung disease. Walking oximetry ordered today  Centrilobular emphysema ([place]) Former PFTs without evidence of obstruction but moderate diffusion defect. Will repeat for eval of progression of disease. Continue Anoro daily in interim.   Chronic cough Suspect multifactorial r/t emphysema, IPF with mild bronchiolectasis, upper airway irriation. Optimize postnasal drainage control with initiation of Claritin, flonase and mucinex. CXR today to r/o underlying etiology - no acute process noted; slight improved coarsened pulmonary parenchyma.  Chronic systolic CHF (congestive heart failure) (H[facility]) With worsening DOE and LE edema, will recheck BNP and BMET today. May need addition of lasix to diuretic regimen. Follow up with cardiology.    Clayton Bibles, NP 03/09/2021  Pt aware and understands NP's role.

## 2021-03-09 NOTE — Telephone Encounter (Signed)
I left a message to see if the patient could come back for a walk test. I was not able to get in touch with the patient.

## 2021-03-09 NOTE — Assessment & Plan Note (Signed)
Compliant with usage. Poorly controlled on current CPAP settings and significant leaks with current mask, dating back to time of mask change. Desensitization mask fitting ordered. Will change to auto set 10-20 cmH2O given breakthrough events.   Patient Instructions  Change CPAP to auto 10-20cmH2O every night, minimum of 4-6 hours a night.  Change equipment every 30 days or as directed by DME. Wash your tubing with warm soap and water daily, hang to dry. Wash humidifier portion weekly.  Maintain clean equipment, as directed by home health agency.  Be aware of reduced alertness and do not drive or operate heavy machinery if experiencing this or drowsiness.  Exercise encouraged, as tolerated. Healthy weight management discussed.  Avoid or decrease alcohol consumption and medications that make you more sleepy, if possible. Notify if persistent daytime sleepiness occurs even with consistent use of CPAP.   Continue Anoro 1 puff daily  Continue Albuterol inhaler 2 puffs every 6 hours as needed for shortness of breath or wheezing. Notify if symptoms persist despite rescue inhaler/neb use.  -Claritin 10 mg daily -Flonase 2 sprays each nostril daily  -Mucinex 600 mg Twice daily for chest congestion and nasal drainage  Pulmonary function testing scheduled today.  Labs today - BNP, BMET  Chest x ray. We will notify you of any abnormal results.   Take CPAP machine to [facility] to have formal download   Follow up after PFTs with Dr. [name], Dr. Ander Slade or Katie Vassie Kugel,NP. If symptoms do not improve or worsen, please contact office for sooner follow up or seek emergency care.

## 2021-03-09 NOTE — Patient Instructions (Addendum)
Change CPAP to auto 10-20cmH2O every night, minimum of 4-6 hours a night.  Change equipment every 30 days or as directed by DME. Wash your tubing with warm soap and water daily, hang to dry. Wash humidifier portion weekly.  Maintain clean equipment, as directed by home health agency.  Be aware of reduced alertness and do not drive or operate heavy machinery if experiencing this or drowsiness.  Exercise encouraged, as tolerated. Healthy weight management discussed.  Avoid or decrease alcohol consumption and medications that make you more sleepy, if possible. Notify if persistent daytime sleepiness occurs even with consistent use of CPAP.   Continue Anoro 1 puff daily  Continue Albuterol inhaler 2 puffs every 6 hours as needed for shortness of breath or wheezing. Notify if symptoms persist despite rescue inhaler/neb use.  -Claritin 10 mg daily -Flonase 2 sprays each nostril daily  -Mucinex 600 mg Twice daily for chest congestion and nasal drainage  Pulmonary function testing scheduled today.  Labs today - BNP, BMET  Chest x ray. We will notify you of any abnormal results.   Take CPAP machine to [facility] to have formal download   Follow up after PFTs with Dr. [name], Dr. Ander Slade or Katie Claire Bridge,NP. If symptoms do not improve or worsen, please contact office for sooner follow up or seek emergency care.

## 2021-03-12 NOTE — Telephone Encounter (Signed)
Called patient and notified him of the results that [facility] left for him. I told him that he needs to follow up with cardiology asap. Patient verbalized understanding and told me that he will follow up with [facility] after his visit with the heart doctor. Nothing further needed

## 2021-03-13 NOTE — Progress Notes (Signed)
Cardiology Office Note:    Date:  03/20/2021   ID:  Rodney Burns, DOB [birth year], MRN [id number]  PCP:  [name], MD  [facility] Cardiologist:  Sokhna Christoph Martinique, MD  [place] Electrophysiologist:  None   Referring MD: [name], MD   Chief Complaint  Patient presents with   Coronary Artery Disease   Congestive Heart Failure    History of Present Illness:    Rodney Burns is a 76 y.o. male with a hx of atrial flutter, HTN, HLD, CAD s/p CABG 1994, pericarditis in January 0300, chronic systolic heart failure, OSA on CPAP, CLL and thoracic aortic aneurysm.  He had radiofrequency ablation in 2011 of atrial tachycardia/flutter however had recurrent atrial flutter after the ablation. He had repeat ablation of another atrial tachy/flutter in 2013 by Dr Rayann Heman.   Myoview in March 2016 showed inferior and anteroapical infarct without ischemia, EF 32%.  Echocardiogram obtained in January 2020 due to pericarditis showed EF 40 to 45%, hypokinesis of the anteroseptal and inferoseptal myocardium, trivial AI, mild MR.  Repeat echocardiogram in October 2020 showed EF 40 to 45%, hypokinesis of the anteroseptal and inferoseptal myocardium, grade 1 DD, mild MR, 40 mm aortic root dilatation.    He was tested positive for COVID-19 in December 2020 and has had a significant acute hypoxic respiratory failure.  He also had a significant transaminitis and was unable to get remdesivir or Actemra.  Toprol-XL was increased and he was given IV digoxin.  CTA January 2021 showed pulmonary fibrosis.  He has been able to wean off of oxygen.  He also completed pulmonary rehab.  Repeat echocardiogram obtained in March 2021 showed EF 35 to 40%, global hypokinesis, normal pulmonary artery systolic pressure, trivial AI, mild dilatation of the aortic root measuring at 72mm.  Spironolactone was added to his medical regimen at the time.  Note, he was in atrial flutter during the echocardiogram even though EKG prior to  that in December 2020 showed a sinus rhythm. When seen in October 2021, EKG showed he was back in sinus rhythm.  He was also having symptomatic orthostatic hypotension at the time and bradycardia, spironolactone was decreased to 12.5 mg daily, Toprol-XL decreased to 50 mg daily. When seen last year digoxin was discontinued. He was felt to be in NSR at that time but no Ecg done.  On follow up today he reports that for the past 4-6 weeks he hasn't felt right. Feels dizzy and lightheaded. 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 He has to stop and rest. Has gained 8 lbs but denies any increase in swelling. No orthopnea or PND. No chest pain or elbow pain. Was seen by pulmonary for his fibrosis. CT in November was unchanged. On Anoro and flonase. Recent pro BNP elevated 638.    Past Medical History:  Diagnosis Date   Atrial tachycardia ([place])    a. s/p DCCV 09/2009; b. s/p RFCA 03/2011; c. s/p DCCV 04/2011; d. s/p RFCA 09/17/11.   Cataract    Chronic systolic CHF (congestive heart failure) ([place])    a. 01/2015 Echo: EF 40-45%, antsept/infsept HK, triv AI, mild MR, mod dil LA, nl RV, mildly dil RA.   CLL (chronic lymphoblastic leukemia)    Stage 0-1   Coronary artery disease involving native coronary artery without angina pectoris    a. Status post CABG in 1994:By Dr. Cyndia Bent. LIMA - L Cx, seqSVG-Diag-LAD, and SVG-PDA-PL.   Cough    Consistant  with ACE inhibitor mediated cough   Dysrhythmia    Glaucoma (increased eye pressure) 1991   History of tobacco abuse    quit 1994   Hypercholesterolemia    Well Controlled   Hypertension    Ischemic cardiomyopathy    a. 02/2011 Echo: EF 40-45%;  b. 01/2015 Echo: EF 40-45%.   Malaria 1972   Hx of   Pericarditis    a. 01/2015-->Treated w/ ibuprofen/colchicine.   Sleep-disordered breathing 06/20/2011    Past Surgical History:  Procedure Laterality Date   ATRIAL FLUTTER ABLATION N/A 04/11/2011   Procedure: ATRIAL FLUTTER ABLATION;  Surgeon:  Deboraha Sprang, MD;  Location: [facility] CATH LAB;  Service: Cardiovascular;  Laterality: N/A;   ATRIAL FLUTTER ABLATION N/A 09/17/2011   Procedure: ATRIAL FLUTTER ABLATION;  Surgeon: Thompson Grayer, MD;  Location: [facility] CATH LAB;  Service: Cardiovascular;  Laterality: N/A;   CARDIOVERSION  06/03/2011   Procedure: CARDIOVERSION;  Surgeon: Deboraha Sprang, MD;  Location: [place];  Service: Cardiovascular;  Laterality: N/A;   CATARACT EXTRACTION     [place]   By Dr. Cyndia Bent. LIMA - L Cx, seqSVG-Diag-LAD, and SVG-PDA-PL   ELECTROPHYSIOLOGY STUDY N/A 04/11/2011   Procedure: ELECTROPHYSIOLOGY STUDY;  Surgeon: Deboraha Sprang, MD;  Location: [facility] CATH LAB;  Service: Cardiovascular;  Laterality: N/A;   EP study and ablation  2013   by Dr Caryl Comes, repeat ablation by Dr Rayann Heman   NM MYOVIEW LTD  03/2014   scar in the inferior and anteroapical regions without ischemia. This is similar to finding on Myoview in 2013. EF is a little lower 39%>>32%.   TRIGGER FINGER RELEASE  12/2016   left hand    US ECHOCARDIOGRAPHY  09-07-09; 02/2011   a. Est EF 40-45%; b. EF 40-45%. Gr 2 DD. Mild LA dil    Current Medications: Current Meds  Medication Sig   albuterol (VENTOLIN HFA) 108 (90 Base) MCG/ACT inhaler Inhale 1 puff into the lungs every 6 (six) hours as needed for wheezing or shortness of breath.   ANORO ELLIPTA 62.5-25 MCG/ACT AEPB USE 1 INHALATION DAILY   Docusate Calcium (STOOL SOFTENER PO) Take by mouth 2 (two) times daily.   ENTRESTO 49-51 MG TAKE 1 TABLET TWICE A DAY   ezetimibe-simvastatin (VYTORIN) 10-20 MG tablet TAKE 1 TABLET AT BEDTIME (KEEP UPCOMING APPOINTMENT FOR REFILLS)   Fiber, Guar Gum, CHEW Chew 3 capsules by mouth daily.    fluticasone (FLONASE) 50 MCG/ACT nasal spray Place 2 sprays into both nostrils daily.   guaiFENesin (MUCINEX) 600 MG 12 hr tablet Take 1 tablet (600 mg total) by mouth 2 (two) times daily.   latanoprost (XALATAN) 0.005 % ophthalmic solution Place 1 drop into both  eyes at bedtime.   loratadine (CLARITIN) 10 MG tablet Take 1 tablet (10 mg total) by mouth daily.   Magnesium 250 MG TABS Take 500 mg by mouth at bedtime.   Melatonin 5 MG CHEW Chew 1 capsule by mouth daily.   metoprolol succinate (TOPROL-XL) 50 MG 24 hr tablet TAKE 1 TABLET IN THE MORNING AND ONE-HALF (1/2) TABLET IN THE EVENING   pramipexole (MIRAPEX) 1.5 MG tablet Take 1.5 mg by mouth daily.   RABEprazole (ACIPHEX) 20 MG tablet Take 1 tablet (20 mg total) by mouth daily.   rivaroxaban (XARELTO) 20 MG TABS tablet TAKE 1 TABLET DAILY WITH SUPPER   VIAGRA 100 MG tablet Take 100 mg by mouth as directed.   [DISCONTINUED] furosemide (LASIX) 20 MG tablet Take  1 tablet (20 mg total) by mouth daily.   [DISCONTINUED] spironolactone (ALDACTONE) 25 MG tablet Take 0.5 tablets (12.5 mg total) by mouth daily.     Allergies:   Penicillins, Ace inhibitors, Darvocet [propoxyphene n-acetaminophen], and Sulfa antibiotics   Social History   Socioeconomic History   Marital status: Married    Spouse name: Not on file   Number of children: 2   Years of education: Not on file   Highest education level: Not on file  Occupational History   Occupation: Tree surgeon    Employer: RETIRED  Tobacco Use   Smoking status: Former    Packs/day: 2.00    Years: 30.00    Pack years: 60.00    Types: Cigarettes    Quit date: 01/22/1992    Years since quitting: 29.1   Smokeless tobacco: Never  Vaping Use   Vaping Use: Never used  Substance and Sexual Activity   Alcohol use: No   Drug use: No   Sexual activity: Yes  Other Topics Concern   Not on file  Social History Narrative   Not on file   Social Determinants of Health   Financial Resource Strain: Not on file  Food Insecurity: Not on file  Transportation Needs: Not on file  Physical Activity: Not on file  Stress: Not on file  Social Connections: Not on file     Family History: The patient's family history includes Cancer in his brother; Down  syndrome in his son; Heart failure in his father.  ROS:   Please see the history of present illness.     All other systems reviewed and are negative.  EKGs/Labs/Other Studies Reviewed:    The following studies were reviewed today:  Echo 04/14/2019 1. Left ventricular ejection fraction, by estimation, is 35 to 40%. The  left ventricle has moderately decreased function. The left ventricle  demonstrates global hypokinesis. Left ventricular diastolic parameters are  indeterminate.   2. Right ventricular systolic function is mildly reduced. The right  ventricular size is normal. There is normal pulmonary artery systolic  pressure. The estimated right ventricular systolic pressure is 29.9 mmHg.   3. The mitral valve is normal in structure. Trivial mitral valve  regurgitation. No evidence of mitral stenosis.   4. The aortic valve is tricuspid. Aortic valve regurgitation is trivial.  Mild aortic valve sclerosis is present, with no evidence of aortic valve  stenosis.   5. Aortic dilatation noted. There is mild dilatation of the aortic root  measuring 37 mm.   6. The inferior vena cava is normal in size with greater than 50%  respiratory variability, suggesting right atrial pressure of 3 mmHg.   7. The patient was in atrial flutter.   EKG:  EKG is ordered today.  Afib with rate 66. Old anterolateral infarct. PVCs. IVCD. I have personally reviewed and interpreted this study.   Recent Labs: 11/28/2020: ALT 22; Hemoglobin 13.7; Platelet Count 102 03/09/2021: BUN 15; Creatinine, Ser 0.93; Potassium 4.4; Pro B Natriuretic peptide (BNP) 638.0; Sodium 139  Recent Lipid Panel    Component Value Date/Time   CHOL 108 10/16/2017 1435   TRIG 93 12/24/2018 1134   HDL 42 10/16/2017 1435   LDLCALC 55 10/16/2017 1435   Dated 10/23/20: cholesterol 111, triglycerides 46, HDL 45, LDL 55.  Risk Assessment/Calculations:       Physical Exam:    VS:  BP 130/72    Pulse 66    Ht 5\' 10"  (1.778 m)    Wt  214 lb (97.1 kg)    BMI 30.71 kg/m     Wt Readings from Last 3 Encounters:  03/20/21 214 lb (97.1 kg)  03/09/21 219 lb (99.3 kg)  12/04/20 204 lb (92.5 kg)     GEN:  Well nourished, well developed in no acute distress HEENT: Normal NECK: No JVD; No carotid bruits LYMPHATICS: No lymphadenopathy CARDIAC: IRRR, no murmurs, rubs, gallops RESPIRATORY:  Clear to auscultation without rales, wheezing or rhonchi  ABDOMEN: Soft, non-tender, non-distended MUSCULOSKELETAL:  No edema; No deformity  SKIN: Warm and dry NEUROLOGIC:  Alert and oriented x 3 PSYCHIATRIC:  Normal affect   ASSESSMENT:    1. Persistent atrial fibrillation ([place])   2. Acute on chronic combined systolic and diastolic CHF (congestive heart failure) ([place])   3. Hx of CABG x 5 '94   4. Hyperlipidemia LDL goal <70   5. Essential hypertension   6. Coronary artery disease involving coronary bypass graft of native heart without angina pectoris     PLAN:    In order of problems listed above:  Atrial fibrillation with controlled response. I suspect this is the major reason for his recent symptoms. On Xarelto and hasn't missed any doses. Will update Echo. Check labs and TSH. Recommend DCCV to restore normal rhythm. If he has recurrent Afib would need to consider AAD therapy with amiodarone vs Tikosyn versus AFib ablation. DCCV procedure and risk reviewed in detail with patient and wife and he is agreeable to proceed.   Acute on chronic combined systolic/diastolic CHF. Symptoms worse with development of Afib. On Entresto and Toprol. Will increase aldactone to 25 mg daily. Add lasix 20 mg daily. Arrange Echo and DCCV. May consider adding SGLT 2 inhibitor. Will repeat BNP (no old pro BNP to compare to).   CAD s/p remote CABG in 1994. No active angina. If Echo shows persistently low EF I would favor ischemic evaluation with right and left cardiac cath but will wait until we can hopefully restore NSR with DCCV. Would need to stay on  Xarelto uninterrupted for 4 weeks following.   Hyperlipidemia. On statin check lab.   Remote atrial flutter/tachycardia s/p ablation x 2.   6.   HTN controlled.         Medication Adjustments/Labs and Tests Ordered: Current medicines are reviewed at length with the patient today.  Concerns regarding medicines are outlined above.  Orders Placed This Encounter  Procedures   Basic metabolic panel   CBC w/Diff/Platelet   B Nat Peptide   TSH   EKG 12-Lead   ECHOCARDIOGRAM COMPLETE   Meds ordered this encounter  Medications   spironolactone (ALDACTONE) 25 MG tablet    Sig: Take 1 tablet (25 mg total) by mouth daily.    Dispense:  45 tablet    Refill:  1   DISCONTD: furosemide (LASIX) 20 MG tablet    Sig: Take 1 tablet (20 mg total) by mouth daily.    Dispense:  90 tablet    Refill:  3    Patient Instructions  Increase aldactone to 25 mg daily  Add lasix 20 mg daily  We will update lab work today  We will schedule you for an Echo  We will arrange for you to have a cardioversion  Follow instructions below    You are scheduled for a  Cardioversion on Thursday 04/05/21 with Dr. Debara Pickett.  Please arrive at the [facility] (Main Entrance A) at [facility]: [address] [place], [place] [address]  at 6:30 am.   DIET: Nothing to eat or drink after midnight except a sip of water with medications (see medication instructions below)  FYI: For your safety, and to allow [place] to monitor your vital signs accurately during the surgery/procedure we request that   if you have artificial nails, gel coating, SNS etc. Please have those removed prior to your surgery/procedure. Not having the nail coverings /polish removed may result in cancellation or delay of your surgery/procedure.   Medication Instructions:   Continue your anticoagulant: Xarelto    Labs: Today bmet,cbc,bnp,tsh    You must have a responsible person to drive you home and stay in the waiting area during your  procedure. Failure to do so could result in cancellation.  Bring your insurance cards.  *Special Note: Every effort is made to have your procedure done on time. Occasionally there are emergencies that occur at the hospital that may cause delays. Please be patient if a delay does occur.       Signed, Syd Newsome Martinique, MD  03/20/2021 12:57 PM    [facility]

## 2021-03-14 NOTE — Progress Notes (Signed)
Rodney Burns, [name], RN   9:40 AM Note Called patient and notified him of the results that [facility] left for him. I told him that he needs to follow up with cardiology asap. Patient verbalized understanding and told me that he will follow up with [facility] after his visit with the heart doctor. Nothing further needed

## 2021-03-15 DIAGNOSIS — H401131 Primary open-angle glaucoma, bilateral, mild stage: Secondary | ICD-10-CM | POA: Diagnosis not present

## 2021-03-19 ENCOUNTER — Encounter: Payer: Self-pay | Admitting: Cardiology

## 2021-03-20 ENCOUNTER — Other Ambulatory Visit: Payer: Self-pay | Admitting: Cardiology

## 2021-03-20 ENCOUNTER — Telehealth: Payer: Self-pay | Admitting: Cardiology

## 2021-03-20 ENCOUNTER — Other Ambulatory Visit: Payer: Self-pay

## 2021-03-20 ENCOUNTER — Ambulatory Visit (INDEPENDENT_AMBULATORY_CARE_PROVIDER_SITE_OTHER): Payer: Medicare Other | Admitting: Cardiology

## 2021-03-20 ENCOUNTER — Encounter: Payer: Self-pay | Admitting: Cardiology

## 2021-03-20 VITALS — BP 130/72 | HR 66 | Ht 70.0 in | Wt 214.0 lb

## 2021-03-20 DIAGNOSIS — I25810 Atherosclerosis of coronary artery bypass graft(s) without angina pectoris: Secondary | ICD-10-CM

## 2021-03-20 DIAGNOSIS — I5043 Acute on chronic combined systolic (congestive) and diastolic (congestive) heart failure: Secondary | ICD-10-CM

## 2021-03-20 DIAGNOSIS — E785 Hyperlipidemia, unspecified: Secondary | ICD-10-CM

## 2021-03-20 DIAGNOSIS — I10 Essential (primary) hypertension: Secondary | ICD-10-CM | POA: Diagnosis not present

## 2021-03-20 DIAGNOSIS — I4819 Other persistent atrial fibrillation: Secondary | ICD-10-CM | POA: Diagnosis not present

## 2021-03-20 DIAGNOSIS — Z951 Presence of aortocoronary bypass graft: Secondary | ICD-10-CM | POA: Diagnosis not present

## 2021-03-20 MED ORDER — SPIRONOLACTONE 25 MG PO TABS: 25.0000 mg | ORAL_TABLET | Freq: Every day | ORAL | 1 refills | Status: DC

## 2021-03-20 MED ORDER — FUROSEMIDE 20 MG PO TABS: 20.0000 mg | ORAL_TABLET | Freq: Every day | ORAL | 3 refills | Status: DC

## 2021-03-20 NOTE — Telephone Encounter (Signed)
Pt c/o medication issue:  1. Name of Medication: furosemide (LASIX) 20 MG tablet  2. How are you currently taking this medication (dosage and times per day)? As written  3. Are you having a reaction (difficulty breathing--STAT)? No   4. What is your medication issue? Medication was sent to the wrong pharmacy. Please resend medication to  [place], [place]

## 2021-03-20 NOTE — Telephone Encounter (Signed)
RX sent to correct pharmacy per patient request.

## 2021-03-20 NOTE — Patient Instructions (Addendum)
Increase aldactone to 25 mg daily  Add lasix 20 mg daily  We will update lab work today  We will schedule you for an Echo  We will arrange for you to have a cardioversion  Follow instructions below    You are scheduled for a  Cardioversion on Thursday 04/05/21 with Dr. Debara Pickett.  Please arrive at the [facility] (Main Entrance A) at [facility]: [address] [place], [place] [address] at 6:30 am.   DIET: Nothing to eat or drink after midnight except a sip of water with medications (see medication instructions below)  FYI: For your safety, and to allow [place] to monitor your vital signs accurately during the surgery/procedure we request that   if you have artificial nails, gel coating, SNS etc. Please have those removed prior to your surgery/procedure. Not having the nail coverings /polish removed may result in cancellation or delay of your surgery/procedure.   Medication Instructions:   Continue your anticoagulant: Xarelto    Labs: Today bmet,cbc,bnp,tsh    You must have a responsible person to drive you home and stay in the waiting area during your procedure. Failure to do so could result in cancellation.  Bring your insurance cards.  *Special Note: Every effort is made to have your procedure done on time. Occasionally there are emergencies that occur at the hospital that may cause delays. Please be patient if a delay does occur.

## 2021-03-21 LAB — BASIC METABOLIC PANEL
BUN/Creatinine Ratio: 21 (ref 10–24)
BUN: 20 mg/dL (ref 8–27)
CO2: 24 mmol/L (ref 20–29)
Calcium: 9.6 mg/dL (ref 8.6–10.2)
Chloride: 104 mmol/L (ref 96–106)
Creatinine, Ser: 0.97 mg/dL (ref 0.76–1.27)
Glucose: 100 mg/dL — ABNORMAL HIGH (ref 70–99)
Potassium: 4.6 mmol/L (ref 3.5–5.2)
Sodium: 141 mmol/L (ref 134–144)
eGFR: 81 mL/min/{1.73_m2} (ref >59)

## 2021-03-21 LAB — CBC WITH DIFFERENTIAL/PLATELET
Basophils Absolute: 0 10*3/uL (ref 0.0–0.2)
Basos: 0 %
EOS (ABSOLUTE): 0.1 10*3/uL (ref 0.0–0.4)
Eos: 1 %
Hematocrit: 43.2 % (ref 37.5–51.0)
Hemoglobin: 13.9 g/dL (ref 13.0–17.7)
Immature Grans (Abs): 0 10*3/uL (ref 0.0–0.1)
Immature Granulocytes: 0 %
Lymphocytes Absolute: 11.4 10*3/uL — ABNORMAL HIGH (ref 0.7–3.1)
Lymphs: 70 %
MCH: 26.9 pg (ref 26.6–33.0)
MCHC: 32.2 g/dL (ref 31.5–35.7)
MCV: 84 fL (ref 79–97)
Monocytes Absolute: 0.6 10*3/uL (ref 0.1–0.9)
Monocytes: 4 %
Neutrophils Absolute: 4.1 10*3/uL (ref 1.4–7.0)
Neutrophils: 25 %
Platelets: 127 10*3/uL — ABNORMAL LOW (ref 150–450)
RBC: 5.16 x10E6/uL (ref 4.14–5.80)
RDW: 13.4 % (ref 11.6–15.4)
WBC: 16.2 10*3/uL — ABNORMAL HIGH (ref 3.4–10.8)

## 2021-03-21 LAB — TSH: TSH: 1.78 u[IU]/mL (ref 0.450–4.500)

## 2021-03-21 LAB — BRAIN NATRIURETIC PEPTIDE: BNP: 304 pg/mL — ABNORMAL HIGH (ref 0.0–100.0)

## 2021-03-21 NOTE — Assessment & Plan Note (Signed)
CLL stage I, lymphocytosis and lymphadenopathy diagnosed 07/31/2009, initial CT scan revealed lymphadenopathy and mildly enlarged spleen. ?? ?Lab review:? ?Patient had COVID-19 pneumonia: He is still recovering from that. ?? ?Treatment plan: Observation. ?Lab review: WBC 15.8, platelets 102 (stable) ?? ?I discussed with him the indications for treatment would be rapid doubling time on development of severe anemia thrombocytopenia, rapidly enlarging lymphadenopathy, B symptoms like fevers chills night sweats and weight loss. ?? ?Chronic thrombocytopenia: Probably low-grade ITP.??Platelet counts are stable at 102. ?? ?Return to clinic in 1 year with labs and follow-up ?

## 2021-03-21 NOTE — Progress Notes (Signed)
? ?Patient Care Team: ?[name], MD as PCP - General (Family Medicine) ?Martinique, [name], MD as PCP - Cardiology (Cardiology) ? ?DIAGNOSIS:  ?  ICD-10-CM   ?1. CLL (chronic lymphocytic leukemia) (HCC)  C91.10   ?  ? ? ?SUMMARY OF ONCOLOGIC HISTORY: ?Oncology History  ? No history exists.  ? ? ?CHIEF COMPLIANT: Follow-up of CLL ? ?INTERVAL HISTORY: Rodney Burns is a 76 y.o. with above-mentioned history of CLL. He reports to the clinic today for follow-up.  ? ?ALLERGIES:  is allergic to penicillins, ace inhibitors, darvocet [propoxyphene n-acetaminophen], and sulfa antibiotics. ? ?MEDICATIONS:  ?Current Outpatient Medications  ?Medication Sig Dispense Refill  ? albuterol (VENTOLIN HFA) 108 (90 Base) MCG/ACT inhaler Inhale 1 puff into the lungs every 6 (six) hours as needed for wheezing or shortness of breath.    ? ANORO ELLIPTA 62.5-25 MCG/ACT AEPB USE 1 INHALATION DAILY 360 each 3  ? Docusate Calcium (STOOL SOFTENER PO) Take by mouth 2 (two) times daily.    ? ENTRESTO 49-51 MG TAKE 1 TABLET TWICE A DAY 180 tablet 3  ? ezetimibe-simvastatin (VYTORIN) 10-20 MG tablet TAKE 1 TABLET AT BEDTIME (KEEP UPCOMING APPOINTMENT FOR REFILLS) 90 tablet 3  ? Fiber, Guar Gum, CHEW Chew 3 capsules by mouth daily.     ? fluticasone (FLONASE) 50 MCG/ACT nasal spray Place 2 sprays into both nostrils daily. 18.2 mL 3  ? furosemide (LASIX) 20 MG tablet Take 1 tablet (20 mg total) by mouth daily. 90 tablet 3  ? guaiFENesin (MUCINEX) 600 MG 12 hr tablet Take 1 tablet (600 mg total) by mouth 2 (two) times daily. 60 tablet 6  ? latanoprost (XALATAN) 0.005 % ophthalmic solution Place 1 drop into both eyes at bedtime.    ? loratadine (CLARITIN) 10 MG tablet Take 1 tablet (10 mg total) by mouth daily. 30 tablet 6  ? Magnesium 250 MG TABS Take 500 mg by mouth at bedtime.    ? Melatonin 5 MG CHEW Chew 1 capsule by mouth daily.    ? metoprolol succinate (TOPROL-XL) 50 MG 24 hr tablet TAKE 1 TABLET IN THE MORNING AND ONE-HALF (1/2) TABLET IN  THE EVENING 135 tablet 3  ? pramipexole (MIRAPEX) 1.5 MG tablet Take 1.5 mg by mouth daily.    ? RABEprazole (ACIPHEX) 20 MG tablet Take 1 tablet (20 mg total) by mouth daily. 90 tablet 3  ? rivaroxaban (XARELTO) 20 MG TABS tablet TAKE 1 TABLET DAILY WITH SUPPER 90 tablet 1  ? spironolactone (ALDACTONE) 25 MG tablet TAKE 1/2 TABLET(12.5 MG) BY MOUTH DAILY 45 tablet 1  ? VIAGRA 100 MG tablet Take 100 mg by mouth as directed.    ? ?No current facility-administered medications for this visit.  ? ? ?PHYSICAL EXAMINATION: ?ECOG PERFORMANCE STATUS: 1 - Symptomatic but completely ambulatory ? ?Vitals:  ? 03/22/21 1010  ?BP: 100/72  ?Pulse: 72  ?Resp: 18  ?Temp: (!) 97.5 ?F (36.4 ?C)  ?SpO2: 96%  ? ?Filed Weights  ? 03/22/21 1010  ?Weight: 211 lb 4.8 oz (95.8 kg)  ? ? ? ?LABORATORY DATA:  ?I have reviewed the data as listed ?CMP Latest Ref Rng & Units 03/22/2021 03/20/2021 03/09/2021  ?Glucose 70 - 99 mg/dL 105(H) 100(H) 106(H)  ?BUN 8 - 23 mg/dL 21 20 15   ?Creatinine 0.61 - 1.24 mg/dL 1.05 0.97 0.93  ?Sodium 135 - 145 mmol/L 140 141 139  ?Potassium 3.5 - 5.1 mmol/L 4.7 4.6 4.4  ?Chloride 98 - 111 mmol/L 106 104  106  ?CO2 22 - 32 mmol/L 29 24 30   ?Calcium 8.9 - 10.3 mg/dL 10.0 9.6 9.3  ?Total Protein 6.5 - 8.1 g/dL 7.0 - -  ?Total Bilirubin 0.3 - 1.2 mg/dL 1.2 - -  ?Alkaline Phos 38 - 126 U/L 36(L) - -  ?AST 15 - 41 U/L 18 - -  ?ALT 0 - 44 U/L 19 - -  ? ? ?Lab Results  ?Component Value Date  ? WBC 17.1 (H) 03/22/2021  ? HGB 13.9 03/22/2021  ? HCT 44.2 03/22/2021  ? MCV 85.0 03/22/2021  ? PLT 122 (L) 03/22/2021  ? NEUTROABS 4.4 03/22/2021  ? ? ?ASSESSMENT & PLAN:  ?CLL (chronic lymphocytic leukemia) ([place]) ?CLL stage I, lymphocytosis and lymphadenopathy diagnosed 07/31/2009, initial CT scan revealed lymphadenopathy and mildly enlarged spleen. ?  ?Lab review:  ?Patient had COVID-19 pneumonia: Since that infection his lymphocyte count had come down to the ?  ?Treatment plan: Observation. ?Lab review: WBC 17.1, platelets 122  (stable) ?  ?I discussed with him the indications for treatment would be rapid doubling time on development of severe anemia thrombocytopenia, rapidly enlarging lymphadenopathy, B symptoms like fevers chills night sweats and weight loss. ?  ?Chronic thrombocytopenia: Probably low-grade ITP.  Platelet counts are stable at 122. ?He has atrial fibrillation and is planning to undergo cardioversion. ?He was diagnosed with congestive heart failure and is now on diuretics.  He sees Dr. Martinique with cardiology. ? ?Return to clinic in 1 year with labs and follow-up ? ? ? ?No orders of the defined types were placed in this encounter. ? ?The patient has a good understanding of the overall plan. he agrees with it. he will call with any problems that may develop before the next visit here. ? ?Total time spent: 20 mins including face to face time and time spent for planning, charting and coordination of care ? ?Rulon Eisenmenger, MD, MPH ?03/22/2021 ? ?I, Thana Ates, am acting as scribe for Dr. Nicholas Lose. ? ?I have reviewed the above documentation for accuracy and completeness, and I agree with the above. ? ? ? ? ? ? ?

## 2021-03-22 ENCOUNTER — Inpatient Hospital Stay (HOSPITAL_BASED_OUTPATIENT_CLINIC_OR_DEPARTMENT_OTHER): Payer: Medicare Other | Admitting: Hematology and Oncology

## 2021-03-22 ENCOUNTER — Ambulatory Visit (HOSPITAL_COMMUNITY): Payer: Medicare Other | Attending: Cardiology

## 2021-03-22 ENCOUNTER — Other Ambulatory Visit: Payer: Self-pay | Admitting: Physician Assistant

## 2021-03-22 ENCOUNTER — Inpatient Hospital Stay: Payer: Medicare Other | Attending: Hematology and Oncology

## 2021-03-22 ENCOUNTER — Other Ambulatory Visit: Payer: Self-pay

## 2021-03-22 DIAGNOSIS — I5043 Acute on chronic combined systolic (congestive) and diastolic (congestive) heart failure: Secondary | ICD-10-CM | POA: Diagnosis not present

## 2021-03-22 DIAGNOSIS — C9110 Chronic lymphocytic leukemia of B-cell type not having achieved remission: Secondary | ICD-10-CM

## 2021-03-22 DIAGNOSIS — I4819 Other persistent atrial fibrillation: Secondary | ICD-10-CM

## 2021-03-22 DIAGNOSIS — E785 Hyperlipidemia, unspecified: Secondary | ICD-10-CM | POA: Diagnosis not present

## 2021-03-22 DIAGNOSIS — I25810 Atherosclerosis of coronary artery bypass graft(s) without angina pectoris: Secondary | ICD-10-CM

## 2021-03-22 DIAGNOSIS — Z951 Presence of aortocoronary bypass graft: Secondary | ICD-10-CM | POA: Diagnosis not present

## 2021-03-22 DIAGNOSIS — I10 Essential (primary) hypertension: Secondary | ICD-10-CM

## 2021-03-22 LAB — CBC WITH DIFFERENTIAL (CANCER CENTER ONLY)
Abs Immature Granulocytes: 0.03 10*3/uL (ref 0.00–0.07)
Basophils Absolute: 0 10*3/uL (ref 0.0–0.1)
Basophils Relative: 0 %
Eosinophils Absolute: 0.1 10*3/uL (ref 0.0–0.5)
Eosinophils Relative: 1 %
HCT: 44.2 % (ref 39.0–52.0)
Hemoglobin: 13.9 g/dL (ref 13.0–17.0)
Immature Granulocytes: 0 %
Lymphocytes Relative: 68 %
Lymphs Abs: 11.7 10*3/uL — ABNORMAL HIGH (ref 0.7–4.0)
MCH: 26.7 pg (ref 26.0–34.0)
MCHC: 31.4 g/dL (ref 30.0–36.0)
MCV: 85 fL (ref 80.0–100.0)
Monocytes Absolute: 0.8 10*3/uL (ref 0.1–1.0)
Monocytes Relative: 5 %
Neutro Abs: 4.4 10*3/uL (ref 1.7–7.7)
Neutrophils Relative %: 26 %
Platelet Count: 122 10*3/uL — ABNORMAL LOW (ref 150–400)
RBC: 5.2 MIL/uL (ref 4.22–5.81)
RDW: 14.6 % (ref 11.5–15.5)
WBC Count: 17.1 10*3/uL — ABNORMAL HIGH (ref 4.0–10.5)
nRBC: 0 % (ref 0.0–0.2)

## 2021-03-22 LAB — ECHOCARDIOGRAM COMPLETE
Height: 70 in
S' Lateral: 4.6 cm
Weight: 3380.8 oz

## 2021-03-22 LAB — CMP (CANCER CENTER ONLY)
ALT: 19 U/L (ref 0–44)
AST: 18 U/L (ref 15–41)
Albumin: 4.7 g/dL (ref 3.5–5.0)
Alkaline Phosphatase: 36 U/L — ABNORMAL LOW (ref 38–126)
Anion gap: 5 (ref 5–15)
BUN: 21 mg/dL (ref 8–23)
CO2: 29 mmol/L (ref 22–32)
Calcium: 10 mg/dL (ref 8.9–10.3)
Chloride: 106 mmol/L (ref 98–111)
Creatinine: 1.05 mg/dL (ref 0.61–1.24)
GFR, Estimated: >60 mL/min (ref >60)
Glucose, Bld: 105 mg/dL — ABNORMAL HIGH (ref 70–99)
Potassium: 4.7 mmol/L (ref 3.5–5.1)
Sodium: 140 mmol/L (ref 135–145)
Total Bilirubin: 1.2 mg/dL (ref 0.3–1.2)
Total Protein: 7 g/dL (ref 6.5–8.1)

## 2021-03-22 LAB — LACTATE DEHYDROGENASE: LDH: 138 U/L (ref 98–192)

## 2021-03-22 MED ORDER — PERFLUTREN LIPID MICROSPHERE
1.0000 mL | INTRAVENOUS | Status: AC | PRN
  Administered 2021-03-22: 1 mL via INTRAVENOUS

## 2021-03-28 ENCOUNTER — Encounter (HOSPITAL_COMMUNITY): Payer: Self-pay | Admitting: Internal Medicine

## 2021-03-28 NOTE — Progress Notes (Signed)
Attempted to obtain medical history via telephone, unable to reach at this time. I left a voicemail to return pre surgical testing department's phone call.  

## 2021-04-04 ENCOUNTER — Telehealth: Payer: Self-pay | Admitting: Cardiology

## 2021-04-04 ENCOUNTER — Encounter (HOSPITAL_COMMUNITY): Payer: Self-pay | Admitting: Anesthesiology

## 2021-04-04 NOTE — Telephone Encounter (Signed)
Returned call to pt he states that he never received directions for his scheduled cardioversion. Informed pt to look at the AVS from the last visit with Dr Martinique. Pt states "it is not on there". Told him to retreive his AVS and I will go over it with him. Read AVS off for him and he states that "now I see it" informed pt to make sure to have a driver and follow directions in AVS and arrive at [facility] at 630am as on AVS. Verbalized understanding. ?

## 2021-04-04 NOTE — Telephone Encounter (Signed)
Patient calling for cardioversion instructions.  ?

## 2021-04-04 NOTE — Anesthesia Preprocedure Evaluation (Deleted)
Anesthesia Evaluation  ? ? ?Airway ? ? ? ? ? ? ? Dental ?  ?Pulmonary ?former smoker,  ?  ? ? ? ? ? ? ? Cardiovascular ?hypertension, Pt. on medications and Pt. on home beta blockers ?+ CAD, + CABG and +CHF (Entresto)  ?+ dysrhythmias Atrial Fibrillation  ? ?03/22/2021 ECHO: EF 35%, mod decreased LVF with global hypokinesis, mildly dilated LV, normal RVF, mildly enlarged RV, mild MR ?  ?Neuro/Psych ?  ? GI/Hepatic ?GERD  Medicated,  ?Endo/Other  ? ? Renal/GU ?  ? ?  ?Musculoskeletal ? ? Abdominal ?  ?Peds ? Hematology ? ?(+) Blood dyscrasia (CLL), , Xarelto   ?Anesthesia Other Findings ? ? Reproductive/Obstetrics ? ?  ? ? ? ? ? ? ? ? ? ? ? ? ? ?  ?  ? ? ? ? ? ? ? ? ?Anesthesia Physical ?Anesthesia Plan ? ?ASA: 4 ? ?Anesthesia Plan: General  ? ?Post-op Pain Management: Minimal or no pain anticipated  ? ?Induction: Intravenous ? ?PONV Risk Score and Plan: 2 and Treatment may vary due to age or medical condition ? ?Airway Management Planned: Mask and Natural Airway ? ?Additional Equipment: None ? ?Intra-op Plan:  ? ?Post-operative Plan:  ? ?Informed Consent:  ? ?Plan Discussed with:  ? ?Anesthesia Plan Comments:   ? ? ? ? ? ? ?Anesthesia Quick Evaluation ? ?

## 2021-04-05 ENCOUNTER — Encounter (HOSPITAL_COMMUNITY)
Admission: RE | Disposition: A | Discharge status: Home / Self Care | Payer: Medicare Other | Source: Home / Self Care | Attending: Internal Medicine

## 2021-04-05 ENCOUNTER — Ambulatory Visit (HOSPITAL_COMMUNITY)
Admission: RE | Admit: 2021-04-05 | Discharge: 2021-04-05 | Disposition: A | Discharge status: Home / Self Care | Payer: Medicare Other | Attending: Internal Medicine | Admitting: Internal Medicine

## 2021-04-05 DIAGNOSIS — I4891 Unspecified atrial fibrillation: Secondary | ICD-10-CM | POA: Insufficient documentation

## 2021-04-05 SURGERY — CANCELLED PROCEDURE

## 2021-04-05 NOTE — Progress Notes (Signed)
Patient in sinus rhythm on arrival.  Confirmed by 12 lead EKG.  Ok to discharge per MD.  ?

## 2021-04-09 ENCOUNTER — Ambulatory Visit: Payer: Medicare Other | Admitting: Nurse Practitioner

## 2021-04-09 ENCOUNTER — Other Ambulatory Visit: Payer: Self-pay | Admitting: *Deleted

## 2021-04-09 DIAGNOSIS — J310 Chronic rhinitis: Secondary | ICD-10-CM

## 2021-04-09 MED ORDER — LORATADINE 10 MG PO TABS: 10.0000 mg | ORAL_TABLET | Freq: Every day | ORAL | 3 refills | Status: DC

## 2021-04-12 NOTE — Progress Notes (Deleted)
?Cardiology Office Note:   ? ?Date:  04/12/2021  ? ?ID:  Rodney Burns, DOB [birth year], MRN [id number] ? ?PCP:  [name], MD  ?[facility] Cardiologist:  Tonita Bills Martinique, MD  ?[facility] Electrophysiologist:  None  ? ?Referring MD: [name], MD  ? ?No chief complaint on file. ? ? ?History of Present Illness:   ? ?Rodney Burns is a 76 y.o. male with a hx of atrial flutter, HTN, HLD, CAD s/p CABG 1994, pericarditis in January 3614, chronic systolic heart failure, OSA on CPAP, CLL and thoracic aortic aneurysm.  He had radiofrequency ablation in 2011 of atrial tachycardia/flutter however had recurrent atrial flutter after the ablation. He had repeat ablation of another atrial tachy/flutter in 2013 by Dr Rayann Heman.   Myoview in March 2016 showed inferior and anteroapical infarct without ischemia, EF 32%.  Echocardiogram obtained in January 2020 due to pericarditis showed EF 40 to 45%, hypokinesis of the anteroseptal and inferoseptal myocardium, trivial AI, mild MR.  Repeat echocardiogram in October 2020 showed EF 40 to 45%, hypokinesis of the anteroseptal and inferoseptal myocardium, grade 1 DD, mild MR, 40 mm aortic root dilatation.   ? ?He was tested positive for COVID-19 in December 2020 and has had a significant acute hypoxic respiratory failure.  He also had a significant transaminitis and was unable to get remdesivir or Actemra.  Toprol-XL was increased and he was given IV digoxin.  CTA January 2021 showed pulmonary fibrosis.  He has been able to wean off of oxygen.  He also completed pulmonary rehab.  Repeat echocardiogram obtained in March 2021 showed EF 35 to 40%, global hypokinesis, normal pulmonary artery systolic pressure, trivial AI, mild dilatation of the aortic root measuring at 72m.  Spironolactone was added to his medical regimen at the time.  Note, he was in atrial flutter during the echocardiogram even though EKG prior to that in December 2020 showed a sinus rhythm. When seen in October  2021, EKG showed he was back in sinus rhythm.  He was also having symptomatic orthostatic hypotension at the time and bradycardia, spironolactone was decreased to 12.5 mg daily, Toprol-XL decreased to 50 mg daily. When seen last year digoxin was discontinued. He was felt to be in NSR at that time but no Ecg done. ? ?When seen earlier this year he noted that for the past 4-6 weeks he hasn't felt right. Feels dizzy and lightheaded. 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 He has to stop and rest. Has gained 8 lbs but denies any increase in swelling. No orthopnea or PND. No chest pain or elbow pain. Was seen by pulmonary for his fibrosis. CT in November was unchanged. On Anoro and flonase. Recent pro BNP elevated 638. He was found to be in Afib with controlled response. Echo was obtained and was unchanged. He was scheduled for DCCV but on arrival on 04/05/21 he was back in NSR. ? ? ? ?Past Medical History:  ?Diagnosis Date  ? Atrial tachycardia ([facility]   ? a. s/p DCCV 09/2009; b. s/p RFCA 03/2011; c. s/p DCCV 04/2011; d. s/p RFCA 09/17/11.  ? Cataract   ? Chronic systolic CHF (congestive heart failure) ([facility]   ? a. 01/2015 Echo: EF 40-45%, antsept/infsept HK, triv AI, mild MR, mod dil LA, nl RV, mildly dil RA.  ? CLL (chronic lymphoblastic leukemia)   ? Stage 0-1  ? Coronary artery disease involving native coronary artery without angina pectoris   ? a. Status  post CABG in 1994:By Dr. Cyndia Bent. LIMA - L Cx, seqSVG-Diag-LAD, and SVG-PDA-PL.  ? Cough   ? Consistant with ACE inhibitor mediated cough  ? Dysrhythmia   ? Glaucoma (increased eye pressure) 1991  ? History of tobacco abuse   ? quit 1994  ? Hypercholesterolemia   ? Well Controlled  ? Hypertension   ? Ischemic cardiomyopathy   ? a. 02/2011 Echo: EF 40-45%;  b. 01/2015 Echo: EF 40-45%.  ? [place]  ? Hx of  ? Pericarditis   ? a. 01/2015-->Treated w/ ibuprofen/colchicine.  ? Sleep-disordered breathing 06/20/2011  ? ? ?Past Surgical History:  ?Procedure  Laterality Date  ? ATRIAL FLUTTER ABLATION N/A 04/11/2011  ? Procedure: ATRIAL FLUTTER ABLATION;  Surgeon: Deboraha Sprang, MD;  [facility] C[facility] LAB;  Service: Cardiovascular;  Laterality: N/A;  ? ATRIAL FLUTTER ABLATION N/A 09/17/2011  ? Procedure: ATRIAL FLUTTER ABLATION;  Surgeon: Thompson Grayer, MD;  Location: [facility] CATH LAB;  Service: Cardiovascular;  Laterality: N/A;  ? CARDIOVERSION  06/03/2011  ? Procedure: CARDIOVERSION;  Surgeon: Deboraha Sprang, MD;  Location: [place];  Service: Cardiovascular;  Laterality: N/A;  ? CATARACT EXTRACTION    ? CORONARY ARTERY BYPASS GRAFT  1994  ? By Dr. Cyndia Bent. LIMA - L Cx, seqSVG-Diag-LAD, and SVG-PDA-PL  ? ELECTROPHYSIOLOGY STUDY N/A 04/11/2011  ? Procedure: ELECTROPHYSIOLOGY STUDY;  Surgeon: Deboraha Sprang, MD;  Location: [facility] CATH LAB;  Service: Cardiovascular;  Laterality: N/A;  ? EP study and ablation  2013  ? by Dr Caryl Comes, repeat ablation by Dr Rayann Heman  ? NM MYOVIEW LTD  03/2014  ? scar in the inferior and anteroapical regions without ischemia. This is similar to finding on Myoview in 2013. EF is a little lower 39%>>32%.  ? TRIGGER FINGER RELEASE  12/2016  ? left hand   ? US ECHOCARDIOGRAPHY  09-07-09; 02/2011  ? a. Est EF 40-45%; b. EF 40-45%. Gr 2 DD. Mild LA dil  ? ? ?Current Medications: ?No outpatient medications have been marked as taking for the 04/17/21 encounter (Appointment) with Martinique, Sumayya Muha M, MD.  ?  ? ?Allergies:   Penicillins, Ace inhibitors, Darvocet [propoxyphene n-acetaminophen], and Sulfa antibiotics  ? ?Social History  ? ?Socioeconomic History  ? Marital status: Married  ?  Spouse name: Not on file  ? Number of children: 2  ? Years of education: Not on file  ? Highest education level: Not on file  ?Occupational History  ? Occupation: Tree surgeon  ?  Employer: RETIRED  ?Tobacco Use  ? Smoking status: Former  ?  Packs/day: 2.00  ?  Years: 30.00  ?  Pack years: 60.00  ?  Types: Cigarettes  ?  Quit date: 01/22/1992  ?  Years since quitting: 29.2  ? Smokeless tobacco:  Never  ?Vaping Use  ? Vaping Use: Never used  ?Substance and Sexual Activity  ? Alcohol use: No  ? Drug use: No  ? Sexual activity: Yes  ?Other Topics Concern  ? Not on file  ?Social History Narrative  ? Not on file  ? ?Social Determinants of Health  ? ?Financial Resource Strain: Not on file  ?Food Insecurity: Not on file  ?Transportation Needs: Not on file  ?Physical Activity: Not on file  ?Stress: Not on file  ?Social Connections: Not on file  ?  ? ?Family History: ?The patient's family history includes Cancer in his brother; Down syndrome in his son; Heart failure in his father. ? ?ROS:   ?Please see the history  of present illness.    ? All other systems reviewed and are negative. ? ?EKGs/Labs/Other Studies Reviewed:   ? ?The following studies were reviewed today: ? ?Echo 04/14/2019 ?1. Left ventricular ejection fraction, by estimation, is 35 to 40%. The  ?left ventricle has moderately decreased function. The left ventricle  ?demonstrates global hypokinesis. Left ventricular diastolic parameters are  ?indeterminate.  ? 2. Right ventricular systolic function is mildly reduced. The right  ?ventricular size is normal. There is normal pulmonary artery systolic  ?pressure. The estimated right ventricular systolic pressure is 32.4 mmHg.  ? 3. The mitral valve is normal in structure. Trivial mitral valve  ?regurgitation. No evidence of mitral stenosis.  ? 4. The aortic valve is tricuspid. Aortic valve regurgitation is trivial.  ?Mild aortic valve sclerosis is present, with no evidence of aortic valve  ?stenosis.  ? 5. Aortic dilatation noted. There is mild dilatation of the aortic root  ?measuring 37 mm.  ? 6. The inferior vena cava is normal in size with greater than 50%  ?respiratory variability, suggesting right atrial pressure of 3 mmHg.  ? 7. The patient was in atrial flutter.  ? ?Echo 03/22/21: IMPRESSIONS  ? ? ? 1. Left ventricular ejection fraction, by estimation, is 35%. The left  ?ventricle has moderately  decreased function. The left ventricle  ?demonstrates global hypokinesis. The left ventricular internal cavity size  ?was mildly dilated. Left ventricular  ?diastolic parameters are indeterminate.  ? 2. Right ventricula

## 2021-04-17 ENCOUNTER — Ambulatory Visit: Payer: Medicare Other | Admitting: Cardiology

## 2021-04-17 ENCOUNTER — Ambulatory Visit (HOSPITAL_BASED_OUTPATIENT_CLINIC_OR_DEPARTMENT_OTHER): Payer: Medicare Other | Attending: Nurse Practitioner | Admitting: Internal Medicine

## 2021-04-17 DIAGNOSIS — G4733 Obstructive sleep apnea (adult) (pediatric): Secondary | ICD-10-CM

## 2021-04-23 ENCOUNTER — Other Ambulatory Visit: Payer: Self-pay | Admitting: Cardiology

## 2021-04-23 ENCOUNTER — Other Ambulatory Visit: Payer: Self-pay | Admitting: Physician Assistant

## 2021-04-23 NOTE — Telephone Encounter (Signed)
Prescription refill request for Xarelto received.  ?Indication:Afib ?Last office visit:2/23 ?Weight:95.7 kg ?Age:76 ?Scr:1.0 ?CrCl:85.07 ml/min ? ?Prescription refilled ? ?

## 2021-04-24 ENCOUNTER — Ambulatory Visit: Payer: Medicare Other | Admitting: Pulmonary Disease

## 2021-04-24 ENCOUNTER — Encounter: Payer: Self-pay | Admitting: Cardiology

## 2021-05-07 ENCOUNTER — Encounter: Payer: Self-pay | Admitting: Nurse Practitioner

## 2021-05-07 ENCOUNTER — Ambulatory Visit (INDEPENDENT_AMBULATORY_CARE_PROVIDER_SITE_OTHER): Payer: Medicare Other | Admitting: Nurse Practitioner

## 2021-05-07 VITALS — BP 124/70 | HR 56 | Ht 70.5 in | Wt 207.0 lb

## 2021-05-07 DIAGNOSIS — I5022 Chronic systolic (congestive) heart failure: Secondary | ICD-10-CM | POA: Diagnosis not present

## 2021-05-07 DIAGNOSIS — R0609 Other forms of dyspnea: Secondary | ICD-10-CM

## 2021-05-07 DIAGNOSIS — J849 Interstitial pulmonary disease, unspecified: Secondary | ICD-10-CM | POA: Diagnosis not present

## 2021-05-07 DIAGNOSIS — R053 Chronic cough: Secondary | ICD-10-CM

## 2021-05-07 DIAGNOSIS — G4733 Obstructive sleep apnea (adult) (pediatric): Secondary | ICD-10-CM | POA: Diagnosis not present

## 2021-05-07 DIAGNOSIS — Z9989 Dependence on other enabling machines and devices: Secondary | ICD-10-CM | POA: Diagnosis not present

## 2021-05-07 DIAGNOSIS — J432 Centrilobular emphysema: Secondary | ICD-10-CM | POA: Diagnosis not present

## 2021-05-07 LAB — PULMONARY FUNCTION TEST
DL/VA % pred: 61 %
DL/VA: 2.42 ml/min/mmHg/L
DLCO cor % pred: 55 %
DLCO cor: 14.08 ml/min/mmHg
DLCO unc % pred: 54 %
DLCO unc: 13.8 ml/min/mmHg
FEF 25-75 Post: 2.4 L/sec
FEF 25-75 Pre: 1.94 L/sec
FEF2575-%Change-Post: 23 %
FEF2575-%Pred-Post: 107 %
FEF2575-%Pred-Pre: 87 %
FEV1-%Change-Post: 5 %
FEV1-%Pred-Post: 94 %
FEV1-%Pred-Pre: 89 %
FEV1-Post: 2.92 L
FEV1-Pre: 2.77 L
FEV1FVC-%Change-Post: 2 %
FEV1FVC-%Pred-Pre: 100 %
FEV6-%Change-Post: 2 %
FEV6-%Pred-Post: 96 %
FEV6-%Pred-Pre: 94 %
FEV6-Post: 3.89 L
FEV6-Pre: 3.8 L
FEV6FVC-%Change-Post: 0 %
FEV6FVC-%Pred-Post: 106 %
FEV6FVC-%Pred-Pre: 106 %
FVC-%Change-Post: 2 %
FVC-%Pred-Post: 91 %
FVC-%Pred-Pre: 89 %
FVC-Post: 3.91 L
FVC-Pre: 3.82 L
Post FEV1/FVC ratio: 75 %
Post FEV6/FVC ratio: 100 %
Pre FEV1/FVC ratio: 73 %
Pre FEV6/FVC Ratio: 100 %
RV % pred: 88 %
RV: 2.3 L
TLC % pred: 80 %
TLC: 5.74 L

## 2021-05-07 NOTE — Assessment & Plan Note (Signed)
Progressive DOE. Stable today. No evidence of progression of IPF or emphysema on recent imaging and PFTs preformed today. Could consider pulmonary rehab moving forward; wanted to hold off at this time.  ?

## 2021-05-07 NOTE — Assessment & Plan Note (Signed)
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 Recent HRCT stable when compared to previous. Previous walking oximetry without desaturations. PFTs stable today. ?

## 2021-05-07 NOTE — Progress Notes (Signed)
Full PFT completed today ? ?

## 2021-05-07 NOTE — Assessment & Plan Note (Signed)
Improvement with postnasal drainage and trigger prevention therapies. Continue flonase and claritin. ?

## 2021-05-07 NOTE — Assessment & Plan Note (Addendum)
Compliant with usage. Poorly controlled on current CPAP settings. Continues to have some mask leaks - already underwent desensitization mask fitting ordered. CPAP titration study ordered ? ?Patient Instructions  ?Continue CPAP to auto 10-20cmH2O every night, minimum of 4-6 hours a night.  ?Change equipment every 30 days or as directed by DME. Wash your tubing with warm soap and water daily, hang to dry. Wash humidifier portion weekly.  ?Be aware of reduced alertness and do not drive or operate heavy machinery if experiencing this or drowsiness.  ?Exercise encouraged, as tolerated. ?Healthy weight management discussed.  ?Avoid or decrease alcohol consumption and medications that make you more sleepy, if possible. ?Notify if persistent daytime sleepiness occurs even with consistent use of CPAP.  ?  ?-Continue Anoro 1 puff daily  ?-Continue Albuterol inhaler 2 puffs every 6 hours as needed for shortness of breath or wheezing. Notify if symptoms persist despite rescue inhaler/neb use. ?-Continue Claritin 10 mg daily ?-Continue Flonase 2 sprays each nostril daily  ?-Continue Mucinex 600 mg Twice daily for chest congestion and nasal drainage ? ?CPAP titration. Someone will contact you for scheduling.  ?  ?Follow up after CPAP titration with Dr. Ander Slade or Katie Tajae Maiolo,NP. If symptoms do not improve or worsen, please contact office for sooner follow up or seek emergency care. ? ? ?

## 2021-05-07 NOTE — Patient Instructions (Addendum)
Continue CPAP to auto 10-20cmH2O every night, minimum of 4-6 hours a night.  ?Change equipment every 30 days or as directed by DME. Wash your tubing with warm soap and water daily, hang to dry. Wash humidifier portion weekly.  ?Be aware of reduced alertness and do not drive or operate heavy machinery if experiencing this or drowsiness.  ?Exercise encouraged, as tolerated. ?Healthy weight management discussed.  ?Avoid or decrease alcohol consumption and medications that make you more sleepy, if possible. ?Notify if persistent daytime sleepiness occurs even with consistent use of CPAP.  ?  ?-Continue Anoro 1 puff daily  ?-Continue Albuterol inhaler 2 puffs every 6 hours as needed for shortness of breath or wheezing. Notify if symptoms persist despite rescue inhaler/neb use. ?-Continue Claritin 10 mg daily ?-Continue Flonase 2 sprays each nostril daily  ?-Continue Mucinex 600 mg Twice daily for chest congestion and nasal drainage ? ?CPAP titration. Someone will contact you for scheduling.  ?  ?Follow up after CPAP titration with Dr. Ander Slade or Katie Mariselda Badalamenti,NP. If symptoms do not improve or worsen, please contact office for sooner follow up or seek emergency care. ?

## 2021-05-07 NOTE — Assessment & Plan Note (Addendum)
PFTs unchanged. No evidence of progressive emphysema/COPD on recent imaging. Continue Anoro daily and PRN albuterol.  ?

## 2021-05-07 NOTE — Assessment & Plan Note (Signed)
Suspect major contributing factor DOE and decreased diffusion capacity. Appears compensated on exam today on current diuretic regimen. Follow up with cardiology as scheduled.   ?

## 2021-05-07 NOTE — Progress Notes (Signed)
? ?'@Patient'$  ID: Rodney Burns, male    DOB: 03/27/1945, 76 y.o.   MRN: [id number] ? ?Chief Complaint  ?Patient presents with  ? Follow-up  ?  PFT results  ? ? ?Referring provider: ?[name], MD ? ?HPI: ?76 year old male, former smoker (60 pack year hx) followed for OSA on CPAP and interstitial pulmonary disease (probable UIP) post COVID. He is followed by Dr. [name] for ILD and Dr. Ander Slade for OSA.  Last seen in office on 03/09/2021 by Abryanna Musolino NP.  Past medical history significant for hypertension, ischemic cardiomyopathy, pericarditis, A-fib on Xarelto, chronic systolic CHF, HLD, CLL, history of CABG x5.  He is followed by Dr. [name] for monitoring of CLL. ? ?TEST/EVENTS:  ?05/05/2019 CPAP titration: AHI 22.8/h: SPO2 low 85%.  Optimal setting 14 cmH2O ?10/19/2019 PFTs: FVC 4.01 (91), FEV1 3.08 (96), ratio 77, TLC 84%, DLCO uncorrected 55%.  No BD.  Overall normal spirometry with moderately severe diffusion defect ?12/06/2020 HRCT: There are coronary artery calcifications status post CABG.  Atherosclerosis is present.  Numerous nonenlarged mediastinal nodes, unchanged.  Mild bilateral air trapping.  Centrilobular and paraseptal emphysema.  Subpleural predominant reticular and groundglass opacities within mid and lower lung predominance.  There is mild traction bronchiolectasis.  Stable solid pulmonary nodule right lower lobe 6 mm, likely fibrosis.  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 ? ?03/09/2021: OV with Ilian Wessell NP.  Reported some issues with CPAP machine.  Wife reported that it is become very noisy at night.  Unable to obtain formal download.  Patient's download showed significant mask leaks, despite the new mask he has, and high AHI's ranging from 10 to 50/h.  Averages about 5 hours and 48 minutes a night, which is usually about how long he sleeps for.  Recently started melatonin which has helped lengthen his sleep.  Feels as though he sleeps well at night otherwise.  Currently using a nasal mask.  Change  CPAP settings to 10-20 and ordered mask desensitization study.  Reported progressive DOE over the past few months and occasional cough with clear sputum.  Also noted intermittent swelling of both his lower extremities.  CXR without acute process; chronic, slightly improved ILD was present.  Recent HRCT showed stable fibrotic changes.  Will order PFTs for further evaluation.  BNP was elevated.  DOE primarily especially noted to be related to CHF with EF 35%. ? ?05/07/2021: Today-follow-up ?Patient presents today for follow-up after pulmonary function testing.  IPF and COPD appear relatively stable on testing without significant decline in FVC or FEV1.  Continues to have decreased diffusion capacity which is overall stable when compared to previous PFTs.  Today, patient reported feeling relatively the same since we saw him last, maybe slight improvement in symptoms.  He was planned to have a cardioversion with cardiology for his a fib but converted to NSR prior to the procedure. Has felt better since this. Still with occasional, minimally productive cough with clear sputum. Leg swelling has somewhat improved; although he still experiences minimal swelling in his ankles some nights. Denies hemoptysis, orthopnea, palpitations or chest pain.  ? ?Continues to wear CPAP nightly. Feels like his new mask fits much better than his old mask did. Denies drowsy driving or morning headaches. Gets around 5-6 hours of sleep a night.  ? ?CPAP Download:  ?90/90 days used (91.1% >4 hr); average usage 5 hours 40 min ?Pressure peak average 14.9 cmH2O ?Time spent with large leak 2 hr 32 min; AHI 20.1 ?Average AHI 11.9  ? ?  Allergies  ?Allergen Reactions  ? Penicillins Other (See Comments)  ?  Put pt in hospital 50 yrs ago  ? Ace Inhibitors Cough  ? Darvocet [Propoxyphene N-Acetaminophen] Rash  ?  Tolerates tylenol  ? Sulfa Antibiotics Rash  ? ? ?Immunization History  ?Administered Date(s) Administered  ? Fluad Quad(high Dose 65+)  10/23/2020  ? Influenza Split 11/23/2015  ? Influenza, High Dose Seasonal PF 10/27/2013, 11/03/2014, 11/23/2015, 10/26/2016, 09/28/2018  ? Influenza,inj,Quad PF,6+ Mos 11/07/2010, 10/22/2012, 10/23/2020  ? Influenza-Unspecified 11/21/2013, 11/04/2016  ? PFIZER(Purple Top)SARS-COV-2 Vaccination 04/02/2019, 04/22/2019, 09/21/2019  ? Pneumococcal Conjugate-13 02/23/2016  ? Pneumococcal Polysaccharide-23 11/18/2011  ? Tdap 03/05/2011  ? ? ?Past Medical History:  ?Diagnosis Date  ? Atrial tachycardia ([place])   ? a. s/p DCCV 09/2009; b. s/p RFCA 03/2011; c. s/p DCCV 04/2011; d. s/p RFCA 09/17/11.  ? Cataract   ? Chronic systolic CHF (congestive heart failure) ([place])   ? a. 01/2015 Echo: EF 40-45%, antsept/infsept HK, triv AI, mild MR, mod dil LA, nl RV, mildly dil RA.  ? CLL (chronic lymphoblastic leukemia)   ? Stage 0-1  ? Coronary artery disease involving native coronary artery without angina pectoris   ? a. Status post CABG in 1994:By Dr. Cyndia Bent. LIMA - L Cx, seqSVG-Diag-LAD, and SVG-PDA-PL.  ? Cough   ? Consistant with ACE inhibitor mediated cough  ? Dysrhythmia   ? Glaucoma (increased eye pressure) 1991  ? History of tobacco abuse   ? quit 1994  ? Hypercholesterolemia   ? Well Controlled  ? Hypertension   ? Ischemic cardiomyopathy   ? a. 02/2011 Echo: EF 40-45%;  b. 01/2015 Echo: EF 40-45%.  ? [place]  ? Hx of  ? Pericarditis   ? a. 01/2015-->Treated w/ ibuprofen/colchicine.  ? Sleep-disordered breathing 06/20/2011  ? ? ?Tobacco History: ?Social History  ? ?Tobacco Use  ?Smoking Status Former  ? Packs/day: 2.00  ? Years: 30.00  ? Pack years: 60.00  ? Types: Cigarettes  ? Quit date: 01/22/1992  ? Years since quitting: 29.3  ?Smokeless Tobacco Never  ? ?Counseling given: Not Answered ? ? ?Outpatient Medications Prior to Visit  ?Medication Sig Dispense Refill  ? albuterol (VENTOLIN HFA) 108 (90 Base) MCG/ACT inhaler Inhale 1 puff into the lungs every 6 (six) hours as needed for wheezing or shortness of breath.    ? ANORO ELLIPTA  62.5-25 MCG/ACT AEPB USE 1 INHALATION DAILY 360 each 3  ? docusate sodium (COLACE) 100 MG capsule Take 100 mg by mouth 2 (two) times daily.    ? ENTRESTO 49-51 MG TAKE 1 TABLET TWICE A DAY 180 tablet 3  ? ezetimibe-simvastatin (VYTORIN) 10-20 MG tablet TAKE 1 TABLET AT BEDTIME (KEEP UPCOMING APPOINTMENT FOR REFILLS) 90 tablet 3  ? Fiber, Guar Gum, CHEW Chew 3 each by mouth daily.    ? fluticasone (FLONASE) 50 MCG/ACT nasal spray Place 2 sprays into both nostrils daily. 18.2 mL 3  ? furosemide (LASIX) 20 MG tablet Take 1 tablet (20 mg total) by mouth daily. 90 tablet 3  ? latanoprost (XALATAN) 0.005 % ophthalmic solution Place 1 drop into both eyes at bedtime.    ? loratadine (CLARITIN) 10 MG tablet Take 1 tablet (10 mg total) by mouth daily. 90 tablet 3  ? Magnesium 250 MG TABS Take 500 mg by mouth at bedtime.    ? Melatonin 5 MG CHEW Chew 5 mg by mouth daily.    ? metoprolol succinate (TOPROL-XL) 50 MG 24 hr tablet TAKE 1 TABLET IN  THE MORNING AND ONE-HALF (1/2) TABLET IN THE EVENING (Patient taking differently: Take 50 mg by mouth daily.) 135 tablet 3  ? pramipexole (MIRAPEX) 1.5 MG tablet Take 1.5 mg by mouth at bedtime.    ? RABEprazole (ACIPHEX) 20 MG tablet Take 1 tablet (20 mg total) by mouth daily. 90 tablet 3  ? spironolactone (ALDACTONE) 25 MG tablet TAKE 1/2 TABLET(12.5 MG) BY MOUTH DAILY (Patient taking differently: Take 25 mg by mouth daily.) 45 tablet 1  ? XARELTO 20 MG TABS tablet TAKE 1 TABLET DAILY WITH SUPPER 90 tablet 3  ? ?No facility-administered medications prior to visit.  ? ? ? ?Review of Systems:  ? ?Constitutional: No weight loss or gain, night sweats, fevers, chills +daytime fatigue (some improvement) ?HEENT: No headaches, difficulty swallowing, tooth/dental problems, or sore throat. No sneezing, itching, ear ache, nasal congestion, or post nasal drip ?CV:  +occasional BLE edema (mostly in evenings). No chest pain, orthopnea, PND, anasarca, dizziness, palpitations, syncope ?Resp:  +shortness of breath with exertion (unchanged); occasional minimally productive cough. No excess mucus or change in color of mucus. No hemoptysis. No wheezing.  No chest wall deformity ?GI:  No heartburn, indigestion

## 2021-05-14 ENCOUNTER — Ambulatory Visit: Payer: Medicare Other | Admitting: Cardiology

## 2021-05-16 ENCOUNTER — Ambulatory Visit (HOSPITAL_BASED_OUTPATIENT_CLINIC_OR_DEPARTMENT_OTHER): Payer: Medicare Other | Attending: Nurse Practitioner | Admitting: Pulmonary Disease

## 2021-05-16 ENCOUNTER — Other Ambulatory Visit: Payer: Self-pay | Admitting: Cardiology

## 2021-05-16 DIAGNOSIS — I2510 Atherosclerotic heart disease of native coronary artery without angina pectoris: Secondary | ICD-10-CM | POA: Insufficient documentation

## 2021-05-16 DIAGNOSIS — I509 Heart failure, unspecified: Secondary | ICD-10-CM | POA: Insufficient documentation

## 2021-05-16 DIAGNOSIS — I4891 Unspecified atrial fibrillation: Secondary | ICD-10-CM | POA: Insufficient documentation

## 2021-05-16 DIAGNOSIS — G4733 Obstructive sleep apnea (adult) (pediatric): Secondary | ICD-10-CM | POA: Insufficient documentation

## 2021-05-16 DIAGNOSIS — I110 Hypertensive heart disease with heart failure: Secondary | ICD-10-CM | POA: Insufficient documentation

## 2021-05-16 DIAGNOSIS — R5383 Other fatigue: Secondary | ICD-10-CM | POA: Diagnosis not present

## 2021-05-23 ENCOUNTER — Ambulatory Visit (INDEPENDENT_AMBULATORY_CARE_PROVIDER_SITE_OTHER): Payer: Medicare Other

## 2021-05-23 ENCOUNTER — Encounter: Payer: Self-pay | Admitting: Physician Assistant

## 2021-05-23 ENCOUNTER — Ambulatory Visit (INDEPENDENT_AMBULATORY_CARE_PROVIDER_SITE_OTHER): Payer: Medicare Other | Admitting: Physician Assistant

## 2021-05-23 VITALS — BP 112/64 | HR 41 | Ht 70.0 in | Wt 210.4 lb

## 2021-05-23 DIAGNOSIS — I25810 Atherosclerosis of coronary artery bypass graft(s) without angina pectoris: Secondary | ICD-10-CM

## 2021-05-23 DIAGNOSIS — I712 Thoracic aortic aneurysm, without rupture, unspecified: Secondary | ICD-10-CM | POA: Diagnosis not present

## 2021-05-23 DIAGNOSIS — E785 Hyperlipidemia, unspecified: Secondary | ICD-10-CM | POA: Diagnosis not present

## 2021-05-23 DIAGNOSIS — I10 Essential (primary) hypertension: Secondary | ICD-10-CM

## 2021-05-23 DIAGNOSIS — I4892 Unspecified atrial flutter: Secondary | ICD-10-CM | POA: Diagnosis not present

## 2021-05-23 DIAGNOSIS — I5022 Chronic systolic (congestive) heart failure: Secondary | ICD-10-CM | POA: Diagnosis not present

## 2021-05-23 DIAGNOSIS — R001 Bradycardia, unspecified: Secondary | ICD-10-CM

## 2021-05-23 NOTE — Progress Notes (Signed)
?Cardiology Office Note:   ? ?Date:  05/25/2021  ? ?ID:  Rodney Burns, DOB 1945-05-01, MRN [id number] ? ?PCP:  Rodney Shanks, MD ?  ?[facility] Providers ?Cardiologist:  Rodney Martinique, MD    ? ?Referring MD: Rodney Shanks, MD  ? ?Chief Complaint  ?Patient presents with  ? Follow-up  ?  Seen for Dr. Martinique  ? ? ?History of Present Illness:   ? ?Rodney Burns is a 76 y.o. male with a hx of atrial flutter, CAD s/p CABG 1994, hypertension, hyperlipidemia, pericarditis in January 3536, chronic systolic heart failure, OSA on CPAP, CLL and thoracic aortic aneurysm.  He had radiofrequency ablation in 2011 and again in 2013.  Myoview in March 2016 showed inferior and anterior apical infarct without ischemia, EF 32%.  Echocardiogram obtained in January 2020 due to pericarditis showed EF 40 to 45%, hypokinesis of the anteroseptal and inferoseptal myocardium, trivial AI and mild MR.  Repeat echocardiogram in October 2020 showed EF 40 to 45%, hypokinesis of the anteroseptal and inferoseptal myocardium, grade 1 DD, 40 mm aortic root dilatation.  He tested positive for COVID-19 in December 2020 and had a significant hypoxic respiratory failure.  He also had a significant transaminitis and was unable to take remdesivir or Actemra.  Toprol-XL was increased and he was given IV digoxin.  CTA in January 2021 showed pulmonary fibrosis.  He has been able to wean off of oxygen.  He completed pulmonary rehab.  Repeat echocardiogram in March 2021 showed EF 35 to 40%, global hypokinesis, normal pulmonary artery systolic pressure, trivial AI, mild dilatation of the aortic root measuring at 37 mm.  He was noted to be in atrial flutter during the echocardiogram.  When he was seen in October 2021, he was back in sinus rhythm.  He did have symptomatic orthostatic hypotension and bradycardia.  Spironolactone and Toprol-XL were decreased.  Digoxin was later discontinued.  Patient was last seen by Dr. Martinique in February 2023, at which time he  complained of dizziness and lightheadedness.  He gained about 8 pounds.  proBNP was elevated at 638.  Spironolactone was increased to 25 mg daily.  Lasix 20 mg daily was added to his medical regimen.  EKG showed the patient was in controlled atrial fibrillation which may have contributed to his symptoms.  Outpatient cardioversion was scheduled for 3/16 however later canceled as patient arrived in sinus rhythm.  Repeat echocardiogram obtained on 03/22/2021 demonstrated EF 35%, global hypokinesis, mild MR, dilated aortic root at 40 mm. ? ?Patient presents today for post cardioversion follow-up.  Heart rate was 41 bpm on arrival.  He continued to have some dizziness and fatigue.  He is currently on Toprol-XL 50 mg a.m. and 25 mg p.m.  He has been on this dose since at least the last year.  I recommended a 3-day heart monitor to assess his average heart rate.  He is still compliant with CPAP therapy, he had a repeat sleep study recently, the result is not available at this time.  I question if some of his bradycardic episode is related to significant obstructive sleep apnea.  Depend on the heart monitor result, I may decide to reduce the metoprolol succinate to 25 mg twice a day.  My concern is by reducing the metoprolol succinate, he may have increased atrial fibrillation/atrial flutter in the future.  We may have to consider antiarrhythmic therapy at some point.  He is not a candidate for flecainide or Multaq given CAD and  systolic heart failure.  He is a poor candidate for amiodarone given history of pulmonary fibrosis.  Sotalol likely will be a poor choice given baseline bradycardia as well.  Only options would be Tikosyn however that requires 3-day hospital admission.  He can follow-up in 1 to 2 months for reassessment. ? ?Past Medical History:  ?Diagnosis Date  ? Atrial tachycardia ([place])   ? a. s/p DCCV 09/2009; b. s/p RFCA 03/2011; c. s/p DCCV 04/2011; d. s/p RFCA 09/17/11.  ? Cataract   ? Chronic systolic CHF  (congestive heart failure) ([place])   ? a. 01/2015 Echo: EF 40-45%, antsept/infsept HK, triv AI, mild MR, mod dil LA, nl RV, mildly dil RA.  ? CLL (chronic lymphoblastic leukemia)   ? Stage 0-1  ? Coronary artery disease involving native coronary artery without angina pectoris   ? a. Status post CABG in 1994:By Dr. Cyndia Burns. LIMA - L Cx, seqSVG-Diag-LAD, and SVG-PDA-PL.  ? Cough   ? Consistant with ACE inhibitor mediated cough  ? Dysrhythmia   ? Glaucoma (increased eye pressure) 1991  ? History of tobacco abuse   ? quit 1994  ? Hypercholesterolemia   ? Well Controlled  ? Hypertension   ? Ischemic cardiomyopathy   ? a. 02/2011 Echo: EF 40-45%;  b. 01/2015 Echo: EF 40-45%.  ? [place]  ? Hx of  ? Pericarditis   ? a. 01/2015-->Treated w/ ibuprofen/colchicine.  ? Sleep-disordered breathing 06/20/2011  ? ? ?Past Surgical History:  ?Procedure Laterality Date  ? ATRIAL FLUTTER ABLATION N/A 04/11/2011  ? Procedure: ATRIAL FLUTTER ABLATION;  Surgeon: Rodney Sprang, MD;  [facility] C[facility] LAB;  Service: Cardiovascular;  Laterality: N/A;  ? ATRIAL FLUTTER ABLATION N/A 09/17/2011  ? Procedure: ATRIAL FLUTTER ABLATION;  Surgeon: Rodney Grayer, MD;  Location: [facility] CATH LAB;  Service: Cardiovascular;  Laterality: N/A;  ? CARDIOVERSION  06/03/2011  ? Procedure: CARDIOVERSION;  Surgeon: Rodney Sprang, MD;  Location: [place];  Service: Cardiovascular;  Laterality: N/A;  ? CATARACT EXTRACTION    ? CORONARY ARTERY BYPASS GRAFT  1994  ? By Dr. Cyndia Burns. LIMA - L Cx, seqSVG-Diag-LAD, and SVG-PDA-PL  ? ELECTROPHYSIOLOGY STUDY N/A 04/11/2011  ? Procedure: ELECTROPHYSIOLOGY STUDY;  Surgeon: Rodney Sprang, MD;  Location: [facility] CATH LAB;  Service: Cardiovascular;  Laterality: N/A;  ? EP study and ablation  2013  ? by Dr Rodney Burns, repeat ablation by Dr Rodney Burns  ? NM MYOVIEW LTD  03/2014  ? scar in the inferior and anteroapical regions without ischemia. This is similar to finding on Myoview in 2013. EF is a little lower 39%>>32%.  ? TRIGGER FINGER RELEASE  12/2016   ? left hand   ? US ECHOCARDIOGRAPHY  09-07-09; 02/2011  ? a. Est EF 40-45%; b. EF 40-45%. Gr 2 DD. Mild LA dil  ? ? ?Current Medications: ?Current Meds  ?Medication Sig  ? albuterol (VENTOLIN HFA) 108 (90 Base) MCG/ACT inhaler Inhale 1 puff into the lungs every 6 (six) hours as needed for wheezing or shortness of breath.  ? ANORO ELLIPTA 62.5-25 MCG/ACT AEPB USE 1 INHALATION DAILY  ? docusate sodium (COLACE) 100 MG capsule Take 100 mg by mouth 2 (two) times daily.  ? ENTRESTO 49-51 MG TAKE 1 TABLET TWICE A DAY  ? ezetimibe-simvastatin (VYTORIN) 10-20 MG tablet TAKE 1 TABLET AT BEDTIME (KEEP UPCOMING APPOINTMENT FOR REFILLS)  ? Fiber, Guar Gum, CHEW Chew 3 each by mouth daily.  ? fluticasone (FLONASE) 50 MCG/ACT nasal spray Place 2 sprays into both  nostrils daily.  ? furosemide (LASIX) 20 MG tablet Take 1 tablet (20 mg total) by mouth daily.  ? latanoprost (XALATAN) 0.005 % ophthalmic solution Place 1 drop into both eyes at bedtime.  ? loratadine (CLARITIN) 10 MG tablet Take 1 tablet (10 mg total) by mouth daily.  ? Magnesium 250 MG TABS Take 500 mg by mouth at bedtime.  ? Melatonin 5 MG CHEW Chew 5 mg by mouth daily.  ? metoprolol succinate (TOPROL-XL) 50 MG 24 hr tablet TAKE 1 TABLET IN THE MORNING AND ONE-HALF (1/2) TABLET IN THE EVENING (Patient taking differently: Take 50 mg by mouth daily.)  ? pramipexole (MIRAPEX) 1.5 MG tablet Take 1.5 mg by mouth at bedtime.  ? RABEprazole (ACIPHEX) 20 MG tablet Take 1 tablet (20 mg total) by mouth daily.  ? spironolactone (ALDACTONE) 25 MG tablet TAKE 1 TABLET DAILY  ? XARELTO 20 MG TABS tablet TAKE 1 TABLET DAILY WITH SUPPER  ?  ? ?Allergies:   Penicillins, Ace inhibitors, Darvocet [propoxyphene n-acetaminophen], and Sulfa antibiotics  ? ?Social History  ? ?Socioeconomic History  ? Marital status: Married  ?  Spouse name: Not on file  ? Number of children: 2  ? Years of education: Not on file  ? Highest education level: Not on file  ?Occupational History  ? Occupation:  Tree surgeon  ?  Employer: RETIRED  ?Tobacco Use  ? Smoking status: Former  ?  Packs/day: 2.00  ?  Years: 30.00  ?  Pack years: 60.00  ?  Types: Cigarettes  ?  Quit date: 01/22/1992  ?  Years since quitting: 29.3  ? Sm

## 2021-05-23 NOTE — Progress Notes (Unsigned)
Enrolled patient for a 3 day Zio XT monitor to be mailed to patients home  Dr Jordan to read 

## 2021-05-23 NOTE — Patient Instructions (Signed)
Medication Instructions:  ?Your physician recommends that you continue on your current medications as directed. Please refer to the Current Medication list given to you today. ? ?*If you need a refill on your cardiac medications before your next appointment, please call your pharmacy* ? ?Lab Work: ?NONE ordered at this time of appointment  ? ?If you have labs (blood work) drawn today and your tests are completely normal, you will receive your results only by: ?MyChart Message (if you have MyChart) OR ?A paper copy in the mail ?If you have any lab test that is abnormal or we need to change your treatment, we will call you to review the results. ? ?Testing/Procedures: ? ?ZIO XT- Long Term Monitor Instructions ? ?Your physician has requested you wear a ZIO patch monitor for 3 days.  ?This is a single patch monitor. [employer] supplies one patch monitor per enrollment. Additional ?stickers are not available. Please do not apply patch if you will be having a Nuclear Stress Test,  ?Echocardiogram, Cardiac CT, MRI, or Chest Xray during the period you would be wearing the  ?monitor. The patch cannot be worn during these tests. You cannot remove and re-apply the  ?ZIO XT patch monitor.  ?Your ZIO patch monitor will be mailed 3 day USPS to your address on file. It may take 3-5 days  ?to receive your monitor after you have been enrolled.  ?Once you have received your monitor, please review the enclosed instructions. Your monitor  ?has already been registered assigning a specific monitor serial # to you. ? ?Billing and Patient Assistance Program Information ? ?We have supplied [employer] with any of your insurance information on file for billing purposes. ?[employer] offers a sliding scale Patient Assistance Program for patients that do not have  ?insurance, or whose insurance does not completely cover the cost of the ZIO monitor.  ?You must apply for the Patient Assistance Program to qualify for this discounted rate.  ?To apply, please  call [employer] at [phone], select option 4, select option 2, ask to apply for  ?Patient Assistance Program. Theodore Demark will ask your household income, and how many people  ?are in your household. They will quote your out-of-pocket cost based on that information.  ?[employer] will also be able to set up a 26-month interest-free payment plan if needed. ? ?Applying the monitor ?  ?Shave hair from upper left chest.  ?Hold abrader disc by orange tab. Rub abrader in 40 strokes over the upper left chest as  ?indicated in your monitor instructions.  ?Clean area with 4 enclosed alcohol pads. Let dry.  ?Apply patch as indicated in monitor instructions. Patch will be placed under collarbone on left  ?side of chest with arrow pointing upward.  ?Rub patch adhesive wings for 2 minutes. Remove white label marked "1". Remove the white  ?label marked "2". Rub patch adhesive wings for 2 additional minutes.  ?While looking in a mirror, press and release button in center of patch. A small green light will  ?flash 3-4 times. This will be your only indicator that the monitor has been turned on.  ?Do not shower for the first 24 hours. You may shower after the first 24 hours.  ?Press the button if you feel a symptom. You will hear a small click. Record Date, Time and  ?Symptom in the Patient Logbook.  ?When you are ready to remove the patch, follow instructions on the last 2 pages of Patient  ?Logbook. Stick patch monitor onto the last page of  Patient Logbook.  ?Place Patient Logbook in the blue and white box. Use locking tab on box and tape box closed  ?securely. The blue and white box has prepaid postage on it. Please place it in the mailbox as  ?soon as possible. Your physician should have your test results approximately 7 days after the  ?monitor has been mailed back to [facility].  ?Call [facility] at [phone] if you have questions regarding  ?your ZIO XT patch monitor. Call them immediately if you see an  orange light blinking on your  ?monitor.  ?If your monitor falls off in less than 4 days, contact our Monitor department at [phone].  ?If your monitor becomes loose or falls off after 4 days call [employer] at [phone] for  ?suggestions on securing your monitor ? ?Follow-Up: ?At [facility], you and your health needs are our priority.  As part of our continuing mission to provide you with exceptional heart care, we have created designated Provider Care Teams.  These Care Teams include your primary Cardiologist (physician) and Advanced Practice Providers (APPs -  Physician Assistants and Nurse Practitioners) who all work together to provide you with the care you need, when you need it. ?  ?Your next appointment:   ?1-2 month(s) ? ?The format for your next appointment:   ?In Person ? ?Provider:   ?[name], MD  or Almyra Deforest, PA-C      ? ? ?Other Instructions ? ? ?Important Information About Sugar ? ? ? ? ? ? ?

## 2021-05-24 ENCOUNTER — Encounter: Payer: Self-pay | Admitting: Physician Assistant

## 2021-05-25 DIAGNOSIS — R001 Bradycardia, unspecified: Secondary | ICD-10-CM

## 2021-05-30 ENCOUNTER — Telehealth: Payer: Self-pay | Admitting: Pulmonary Disease

## 2021-05-30 DIAGNOSIS — D696 Thrombocytopenia, unspecified: Secondary | ICD-10-CM | POA: Diagnosis not present

## 2021-05-30 DIAGNOSIS — G2581 Restless legs syndrome: Secondary | ICD-10-CM | POA: Diagnosis not present

## 2021-05-30 DIAGNOSIS — R7301 Impaired fasting glucose: Secondary | ICD-10-CM | POA: Diagnosis not present

## 2021-05-30 DIAGNOSIS — Z125 Encounter for screening for malignant neoplasm of prostate: Secondary | ICD-10-CM | POA: Diagnosis not present

## 2021-05-30 DIAGNOSIS — E785 Hyperlipidemia, unspecified: Secondary | ICD-10-CM | POA: Diagnosis not present

## 2021-05-30 DIAGNOSIS — Z0000 Encounter for general adult medical examination without abnormal findings: Secondary | ICD-10-CM | POA: Diagnosis not present

## 2021-05-30 DIAGNOSIS — Z1389 Encounter for screening for other disorder: Secondary | ICD-10-CM | POA: Diagnosis not present

## 2021-05-30 NOTE — Telephone Encounter (Signed)
Call patient ? ?Sleep study result ? ?Date of study: ?05/16/2021 ? ?Impression: ?Obstructive sleep apnea ?Suboptimal treatment with CPAP ? ?Recommendation: ?DME referral ? ?Recommend BiPAP therapy for obstructive sleep apnea ? ?Trial of BiPAP therapy on 22/18 cm H2O with a Small-Medium size [employer]kel Full Face Evora Full mask and heated humidification. ? ?Encourage weight loss measures ? ?Follow-up in the office 4 to 6 weeks following initiation of treatment ? ?

## 2021-05-30 NOTE — Procedures (Signed)
?POLYSOMNOGRAPHY ? ?Last, First: Rodney Burns ?MRN: [id number] ?Gender: Male ?Age (years): 76 ?Weight (lbs): 205 ?DOB: May 18, 1945 ?BMI: 29 ?Primary Care: No PCP ?Epworth Score: 3 ?Referring: Laurin Coder MD ?Technician: Carolin Coy ?Interpreting: Laurin Coder MD ?Study Type: BiPAP ?Ordered Study Type: CPAP ?Study date: 05/16/2021 ?Location: [facility] ?CLINICAL INFORMATION ?Rodney Burns is a 76 year old Male and was referred to the sleep center for evaluation of 327.23 - Obstructive Sleep Apnea, 780.57 - Unspecified Sleep Apnea, G47.33 OSA: Adult and Pediatric (327.23). Indications include Atrial Fibrillation, CAD, Cardiac Arrhythmias, Cardiac History, CHF, Daytime Fatigue, Established OSA, Excessive Daytime Sleepiness, Fatigue, Hypertension (401.9), Leg jerks/kicks during the night, Morning Dry Mouth, OSA. ? ? ?Most recent polysomnogram dated 03/30/2019 revealed an AHI of 22.8/h and RDI of 35.3/h. Most recent titration study dated 05/05/2019 was optimal at 14cm H2O with an AHI of 0.0/h. ?MEDICATIONS ?Patient self administered medications include: entresto, VYTORIN, XALATAN, MAGNESIUM, toprol-xl, MIRAPEX, stool softner, xarelto, VITAMIN D, MELATONIN, PRAMIPEXOLE, LATANOPROST, vitamin d3. Medications administered during study include No sleep medicine administered. ? ?SLEEP STUDY TECHNIQUE ?The patient underwent an attended overnight polysomnography titration to assess the effects of BIPAP therapy. The following variables were monitored: EEG(C4-A1, C3-A2, O1-A2, O2-A1), EOG, submental and leg EMG, ECG, oxyhemoglobin saturation by pulse oximetry, thoracic and abdominal respiratory effort belts, nasal/oral airflow by pressure sensor, body position sensor and snoring sensor. BIPAP pressure was titrated to eliminate apneas, hypopneas and oxygen desaturation. Hypopneas were scored per AASM definition IB (4% desaturation) ? ?TECHNICIAN COMMENTS ?Comments added by Technician: PATIENT WAS ORDERED AS A CPAP/BIPAP  TITRATION. ?Comments added by Scorer: N/A ?SLEEP ARCHITECTURE ?The study was initiated at 10:25:40 PM and terminated at 4:18:54 AM. Total recorded time was 353.2 minutes. EEG confirmed total sleep time was 317.5 minutes yielding a sleep efficiency of 89.9%%. Sleep onset after lights out was 5.3 minutes with a REM latency of 162.0 minutes. The patient spent 23.1%% of the night in stage N1 sleep, 68.8%% in stage N2 sleep, 0.0%% in stage N3 and 8% in REM. The Arousal Index was 46.5/hour. ?RESPIRATORY PARAMETERS ?The overall AHI was 12.7 per hour, and the RDI was 32.7 events/hour with a central apnea index of 0.2per hour. The most appropriate setting of BiPAP was 22/18 cm H2O. At this setting, the sleep efficiency was 86 % and the patient was supine for 0%. The AHI was 0.8 events per hour, and the RDI was 5.6 events/hour (with 0.2 central events) and the arousal index was 20.7 per hour.The oxygen nadir was 92.0% during sleep. ? ? ? ?The cumulative time under 88% oxygen saturation was 5.5 minutes ? ?LEG MOVEMENT DATA ?The total leg movements were 53 with a resulting leg movement index of 10.0. Associated arousal with leg movement index was 1.3. ?CARDIAC DATA ?The underlying cardiac rhythm was most consistent with sinus rhythm. Mean heart rate during sleep was 51.3 bpm. Additional rhythm abnormalities include Atrial Fibrillation, PVCs. ?IMPRESSIONS ?- Obstructive Sleep apnea(OSA) Optimal pressure attained. ?- Electrocardiographic data showed presence of Atrial Fibrillation, PVCs. ?- No Oxygen Desaturation at optimal pressures. ?- The patient snored with moderate snoring volume. ?- No significant periodic leg movements(PLMs) during sleep. However, no significant associated arousals. ? ?DIAGNOSIS ?- Obstructive Sleep Apnea (G47.33) ? ?RECOMMENDATIONS ?- Trial of BiPAP therapy on 22/18 cm H2O with a Small-Medium size [employer] Full Face Evora Full mask and heated humidification. ?- Avoid alcohol, sedatives and other CNS  depressants that may worsen sleep apnea and disrupt normal sleep architecture. ?- Sleep hygiene should be  reviewed to assess factors that may improve sleep quality. ?- Weight management and regular exercise should be initiated or continued. ?- Return to Sleep Center for re-evaluation after 4 weeks of therapy ? ?[Electronically signed] 05/30/2021 07:31 AM ? ?Sherrilyn Rist MD ?NPI: [id number] ? ?

## 2021-05-30 NOTE — Telephone Encounter (Signed)
I left a message for the patient to call back for his results.  ?

## 2021-05-30 NOTE — Progress Notes (Signed)
POLYSOMNOGRAPHY. ? ?Last, First: Rodney Burns ?MRN: [id number] ?Gender: Male ?Age (years): 76 ?Weight (lbs): 205 ?DOB: 1945/11/03 ?BMI: 29 ?Primary Care: No PCP ?Epworth Score: 3 ?Referring: Laurin Coder MD ?Technician: Carolin Coy ?Interpreting: Laurin Coder MD ?Study Type: BiPAP ?Ordered Study Type: CPAP ?Study date: 05/16/2021 ?Location: [place] ?CLINICAL INFORMATION ?Rodney Burns is a 76 year old Male and was referred to the sleep center for evaluation of 327.23 - Obstructive Sleep Apnea, 780.57 - Unspecified Sleep Apnea, G47.33 OSA: Adult and Pediatric (327.23). Indications include Atrial Fibrillation, CAD, Cardiac Arrhythmias, Cardiac History, CHF, Daytime Fatigue, Established OSA, Excessive Daytime Sleepiness, Fatigue, Hypertension (401.9), Leg jerks/kicks during the night, Morning Dry Mouth, OSA. ? ? ?Most recent polysomnogram dated 03/30/2019 revealed an AHI of 22.8/h and RDI of 35.3/h. Most recent titration study dated 05/05/2019 was optimal at 14cm H2O with an AHI of 0.0/h. ?MEDICATIONS ?Patient self administered medications include: entresto, VYTORIN, XALATAN, MAGNESIUM, toprol-xl, MIRAPEX, stool softner, xarelto, VITAMIN D, MELATONIN, PRAMIPEXOLE, LATANOPROST, vitamin d3. Medications administered during study include No sleep medicine administered. ? ?SLEEP STUDY TECHNIQUE ?The patient underwent an attended overnight polysomnography titration to assess the effects of BIPAP therapy. The following variables were monitored: EEG(C4-A1, C3-A2, O1-A2, O2-A1), EOG, submental and leg EMG, ECG, oxyhemoglobin saturation by pulse oximetry, thoracic and abdominal respiratory effort belts, nasal/oral airflow by pressure sensor, body position sensor and snoring sensor. BIPAP pressure was titrated to eliminate apneas, hypopneas and oxygen desaturation. Hypopneas were scored per AASM definition IB (4% desaturation) ? ?TECHNICIAN COMMENTS ?Comments added by Technician: PATIENT WAS ORDERED AS A CPAP/BIPAP  TITRATION. ?Comments added by Scorer: N/A ?SLEEP ARCHITECTURE ?The study was initiated at 10:25:40 PM and terminated at 4:18:54 AM. Total recorded time was 353.2 minutes. EEG confirmed total sleep time was 317.5 minutes yielding a sleep efficiency of 89.9%%. Sleep onset after lights out was 5.3 minutes with a REM latency of 162.0 minutes. The patient spent 23.1%% of the night in stage N1 sleep, 68.8%% in stage N2 sleep, 0.0%% in stage N3 and 8% in REM. The Arousal Index was 46.5/hour. ?RESPIRATORY PARAMETERS ?The overall AHI was 12.7 per hour, and the RDI was 32.7 events/hour with a central apnea index of 0.2per hour. The most appropriate setting of BiPAP was 22/18 cm H2O. At this setting, the sleep efficiency was 86 % and the patient was supine for 0%. The AHI was 0.8 events per hour, and the RDI was 5.6 events/hour (with 0.2 central events) and the arousal index was 20.7 per hour.The oxygen nadir was 92.0% during sleep. ? ? ? ?The cumulative time under 88% oxygen saturation was 5.5 minutes ? ?LEG MOVEMENT DATA ?The total leg movements were 53 with a resulting leg movement index of 10.0. Associated arousal with leg movement index was 1.3. ?CARDIAC DATA ?The underlying cardiac rhythm was most consistent with sinus rhythm. Mean heart rate during sleep was 51.3 bpm. Additional rhythm abnormalities include Atrial Fibrillation, PVCs. ?IMPRESSIONS ?- Obstructive Sleep apnea(OSA) Optimal pressure attained. ?- Electrocardiographic data showed presence of Atrial Fibrillation, PVCs. ?- No Oxygen Desaturation at optimal pressures. ?- The patient snored with moderate snoring volume. ?- No significant periodic leg movements(PLMs) during sleep. However, no significant associated arousals. ? ?DIAGNOSIS ?- Obstructive Sleep Apnea (G47.33) ? ?RECOMMENDATIONS ?- Trial of BiPAP therapy on 22/18 cm H2O with a Small-Medium size [employer] Full Face Evora Full mask and heated humidification. ?- Avoid alcohol, sedatives and other CNS  depressants that may worsen sleep apnea and disrupt normal sleep architecture. ?- Sleep hygiene should be  reviewed to assess factors that may improve sleep quality. ?- Weight management and regular exercise should be initiated or continued. ?- Return to Sleep Center for re-evaluation after 4 weeks of therapy ? ?[Electronically signed] 05/30/2021 07:31 AM ? ?Sherrilyn Rist MD ?NPI: [id number] ? ?

## 2021-05-30 NOTE — Progress Notes (Signed)
Polysomnography ? ?Last, First: Rodney Burns ?MRN: [id number] ?Gender: Male ?Age (years): 76 ?Weight (lbs): 205 ?DOB: 1945/12/10 ?BMI: 29 ?Primary Care: No PCP ?Epworth Score: 3 ?Referring: Laurin Coder MD ?Technician: Carolin Coy ?Interpreting: Laurin Coder MD ?Study Type: BiPAP ?Ordered Study Type: CPAP ?Study date: 05/16/2021 ?Location: [place] ?CLINICAL INFORMATION ?Rodney Burns is a 76 year old Male and was referred to the sleep center for evaluation of 327.23 - Obstructive Sleep Apnea, 780.57 - Unspecified Sleep Apnea, G47.33 OSA: Adult and Pediatric (327.23). Indications include Atrial Fibrillation, CAD, Cardiac Arrhythmias, Cardiac History, CHF, Daytime Fatigue, Established OSA, Excessive Daytime Sleepiness, Fatigue, Hypertension (401.9), Leg jerks/kicks during the night, Morning Dry Mouth, OSA. ? ? ?Most recent polysomnogram dated 03/30/2019 revealed an AHI of 22.8/h and RDI of 35.3/h. Most recent titration study dated 05/05/2019 was optimal at 14cm H2O with an AHI of 0.0/h. ?MEDICATIONS ?Patient self administered medications include: entresto, VYTORIN, XALATAN, MAGNESIUM, toprol-xl, MIRAPEX, stool softner, xarelto, VITAMIN D, MELATONIN, PRAMIPEXOLE, LATANOPROST, vitamin d3. Medications administered during study include No sleep medicine administered. ? ?SLEEP STUDY TECHNIQUE ?The patient underwent an attended overnight polysomnography titration to assess the effects of BIPAP therapy. The following variables were monitored: EEG(C4-A1, C3-A2, O1-A2, O2-A1), EOG, submental and leg EMG, ECG, oxyhemoglobin saturation by pulse oximetry, thoracic and abdominal respiratory effort belts, nasal/oral airflow by pressure sensor, body position sensor and snoring sensor. BIPAP pressure was titrated to eliminate apneas, hypopneas and oxygen desaturation. Hypopneas were scored per AASM definition IB (4% desaturation) ? ?TECHNICIAN COMMENTS ?Comments added by Technician: PATIENT WAS ORDERED AS A CPAP/BIPAP  TITRATION. ?Comments added by Scorer: N/A ?SLEEP ARCHITECTURE ?The study was initiated at 10:25:40 PM and terminated at 4:18:54 AM. Total recorded time was 353.2 minutes. EEG confirmed total sleep time was 317.5 minutes yielding a sleep efficiency of 89.9%%. Sleep onset after lights out was 5.3 minutes with a REM latency of 162.0 minutes. The patient spent 23.1%% of the night in stage N1 sleep, 68.8%% in stage N2 sleep, 0.0%% in stage N3 and 8% in REM. The Arousal Index was 46.5/hour. ?RESPIRATORY PARAMETERS ?The overall AHI was 12.7 per hour, and the RDI was 32.7 events/hour with a central apnea index of 0.2per hour. The most appropriate setting of BiPAP was 22/18 cm H2O. At this setting, the sleep efficiency was 86 % and the patient was supine for 0%. The AHI was 0.8 events per hour, and the RDI was 5.6 events/hour (with 0.2 central events) and the arousal index was 20.7 per hour.The oxygen nadir was 92.0% during sleep. ? ? ? ?The cumulative time under 88% oxygen saturation was 5.5 minutes ? ?LEG MOVEMENT DATA ?The total leg movements were 53 with a resulting leg movement index of 10.0. Associated arousal with leg movement index was 1.3. ?CARDIAC DATA ?The underlying cardiac rhythm was most consistent with sinus rhythm. Mean heart rate during sleep was 51.3 bpm. Additional rhythm abnormalities include Atrial Fibrillation, PVCs. ?IMPRESSIONS ?- Obstructive Sleep apnea(OSA) Optimal pressure attained. ?- Electrocardiographic data showed presence of Atrial Fibrillation, PVCs. ?- No Oxygen Desaturation at optimal pressures. ?- The patient snored with moderate snoring volume. ?- No significant periodic leg movements(PLMs) during sleep. However, no significant associated arousals. ? ?DIAGNOSIS ?- Obstructive Sleep Apnea (G47.33) ? ?RECOMMENDATIONS ?- Trial of BiPAP therapy on 22/18 cm H2O with a Small-Medium size [employer] Full Face Evora Full mask and heated humidification. ?- Avoid alcohol, sedatives and other CNS  depressants that may worsen sleep apnea and disrupt normal sleep architecture. ?- Sleep hygiene should be  reviewed to assess factors that may improve sleep quality. ?- Weight management and regular exercise should be initiated or continued. ?- Return to Sleep Center for re-evaluation after 4 weeks of therapy ? ?[Electronically signed] 05/30/2021 07:31 AM ? ?Sherrilyn Rist MD ?NPI: [id number] ? ?

## 2021-05-31 ENCOUNTER — Ambulatory Visit (INDEPENDENT_AMBULATORY_CARE_PROVIDER_SITE_OTHER): Payer: Medicare Other | Admitting: Nurse Practitioner

## 2021-05-31 ENCOUNTER — Encounter: Payer: Self-pay | Admitting: Nurse Practitioner

## 2021-05-31 VITALS — BP 110/66 | HR 51 | Temp 98.2°F | Ht 70.0 in | Wt 209.6 lb

## 2021-05-31 DIAGNOSIS — I5022 Chronic systolic (congestive) heart failure: Secondary | ICD-10-CM | POA: Diagnosis not present

## 2021-05-31 DIAGNOSIS — G4733 Obstructive sleep apnea (adult) (pediatric): Secondary | ICD-10-CM

## 2021-05-31 DIAGNOSIS — J432 Centrilobular emphysema: Secondary | ICD-10-CM

## 2021-05-31 DIAGNOSIS — J849 Interstitial pulmonary disease, unspecified: Secondary | ICD-10-CM | POA: Diagnosis not present

## 2021-05-31 DIAGNOSIS — R001 Bradycardia, unspecified: Secondary | ICD-10-CM | POA: Diagnosis not present

## 2021-05-31 MED ORDER — TRELEGY ELLIPTA 100-62.5-25 MCG/ACT IN AEPB: 1.0000 | INHALATION_SPRAY | Freq: Every day | RESPIRATORY_TRACT | 0 refills | Status: DC

## 2021-05-31 NOTE — Assessment & Plan Note (Addendum)
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 HRCT from November stable when compared to previous. Previous walking oximetry without desaturations. Recent PFTs stable. Suspect DOE is multifactorial with large component r/t systolic CHF but given progression, will repeat HRCT for further evaluation.  ?

## 2021-05-31 NOTE — Assessment & Plan Note (Signed)
Compliant with usage. Poorly controlled on current CPAP settings. Was transitioned to BiPAP during CPAP titration; optimal settings 22/18 cmH2O. Order sent to DME for new start BiPAP.  ? ?Patient Instructions  ?Continue CPAP therapy until you get your BiPAP  ?BiPAP therapy on 22/18 cm H2O with a Small-Medium size [employer] Full Face Evora Full mask and heated humidification. ?Wear for a minimum of 4-6 hours a night ?  ?-Stop Anoro. Trial Trelegy 1 puff daily. Brush tongue and rinse mouth afterwards.  ?-Continue Albuterol inhaler 2 puffs every 6 hours as needed for shortness of breath or wheezing. Notify if symptoms persist despite rescue inhaler/neb use. ?-Continue Claritin 10 mg daily ?-Continue Flonase 2 sprays each nostril daily  ?-Continue Mucinex 600 mg Twice daily for chest congestion and nasal drainage ? ?HRCT chest for evaluation of your fibrosis ?  ?Follow up after HRCT with Dr. [name] or Alanson Aly. If symptoms do not improve or worsen, please contact office for sooner follow up or seek emergency care. ? ? ?

## 2021-05-31 NOTE — Assessment & Plan Note (Signed)
PFTs stable. Had some midlfow reversibility; can trial Trelegy for a month. Advised if he does not notice any improvement in his breathing, he can return to using Anoro. Continue PRN albuterol.  ?

## 2021-05-31 NOTE — Patient Instructions (Addendum)
Continue CPAP therapy until you get your BiPAP  ?BiPAP therapy on 22/18 cm H2O with a Small-Medium size [employer] Full Face Evora Full mask and heated humidification. ?Wear for a minimum of 4-6 hours a night ?  ?-Stop Anoro. Trial Trelegy 1 puff daily. Brush tongue and rinse mouth afterwards.  ?-Continue Albuterol inhaler 2 puffs every 6 hours as needed for shortness of breath or wheezing. Notify if symptoms persist despite rescue inhaler/neb use. ?-Continue Claritin 10 mg daily ?-Continue Flonase 2 sprays each nostril daily  ?-Continue Mucinex 600 mg Twice daily for chest congestion and nasal drainage ? ?HRCT chest for evaluation of your fibrosis ?  ?Follow up after HRCT with Dr. [name] or Alanson Aly. If symptoms do not improve or worsen, please contact office for sooner follow up or seek emergency care. ?

## 2021-05-31 NOTE — Progress Notes (Signed)
? ?'@Patient'$  ID: Rodney Burns, male    DOB: 1945-04-09, 76 y.o.   MRN: [id number] ? ?Chief Complaint  ?Patient presents with  ? Follow-up  ?  Pt is here to go over sleep study results. Pt is currently on cpap machine.   ? ? ?Referring provider: ?[name], MD ? ?HPI: ?76 year old male, former smoker (60-pack-year history) followed for OSA, emphysema and interstitial pulmonary disease (probable UIP) post-COVID.  He is followed by Dr. [name] for ILD and Dr. Ander Slade for OSA.  Last seen in office by Abdulah Iqbal NP on 05/07/2021.  Past medical history significant for hypertension, ischemic cardiomyopathy, pericarditis, A-fib on Xarelto, chronic systolic CHF, HLD, CLL, history of CABG x5.  He is followed by Dr. [name] for monitoring of CLL. ? ?TEST/EVENTS:  ?05/05/2019 CPAP titration: AHI 22.8/h, SPO2 low 85%.  Optimal setting 14 cmH2O.  Moderate obstructive sleep apnea. ?10/19/2019 PFTs: FVC 4.01 (91), FEV1 3.08 (96), ratio 77, TLC 84%, DLCO uncorrected 55%.  No BD.  Overall normal spirometry with moderately severe diffusion defect. ?12/06/2020 HRCT: There are coronary artery calcifications status post CABG.  Atherosclerosis is present.  Numerous nonenlarged mediastinal nodes, unchanged.  Mild bilateral air trapping.  Centrilobular and paraseptal emphysema.  Subpleural predominant reticular and groundglass opacities within mid and lower lung predominance.  There is mild traction bronchiolectasis.  Stable solid pulmonary nodule right lower lobe 6 mm, likely fibrosis.  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 ?05/07/2021 PFTs: FVC 91, FEV1 94, ratio 75, TLC 80, DLCO corrected for alveolar volume 55%.  No significant BD; did have some mid flow reversibility.  Overall normal spirometry with stable diffusion defect ?05/16/2021 CPAP titration: AHI 12.7 on CPAP; transition to BiPAP therapy.  Appropriate setting of 22/18 cmH2O with residual AHI 0.8 ? ?03/09/2021: OV with Aurelio Mccamy NP.  Reported some issues with CPAP machine.  Wife  reported that it is become very noisy at night.  Unable to obtain formal download.  Patient's download showed significant mask leaks, despite the new mask he has, and high AHI's ranging from 10 to 50/h.  Averages about 5 hours and 48 minutes a night, which is usually about how long he sleeps for.  Recently started melatonin which has helped lengthen his sleep.  Feels as though he sleeps well at night otherwise.  Currently using a nasal mask.  Change CPAP settings to 10-20 and ordered mask desensitization study.  Reported progressive DOE over the past few months and occasional cough with clear sputum.  Also noted intermittent swelling of both his lower extremities.  CXR without acute process; chronic, slightly improved ILD was present.  Recent HRCT showed stable fibrotic changes.  Will order PFTs for further evaluation.  BNP was elevated.  DOE primarily especially noted to be related to CHF with EF 35%. ? ?05/07/2021: OV with Tashan Kreitzer NP for follow-up after pulmonary function testing.  PFTs were overall stable when compared to a year ago.  Did have some mid flow reversibility which was not identified on previous testing.  Suspected that diffusion defect was multifactorial including systolic CHF.  At [place] patient reported feeling relatively the same as he did previous visit.  He was planned to have a cardioversion with cardiology for A-fib but converted to normal sinus rhythm prior to the procedure.  Has felt somewhat better since then.  Still with a minimally productive cough.  Leg swelling has improved.  Continues to wear CPAP nightly.  Did feel like mask fit better.  Unfortunately download still showed residual AHI of  11.9/h.  CPAP titration study ordered with plans to transition to BiPAP if not well controlled on CPAP.  Advised to continue on Anoro, Claritin and Flonase.  As needed albuterol.  IPF and emphysema appeared overall stable with recent walking oximetry without desaturations.  Last HRCT was from November.   Suspected some of his progressive DOE was likely related to CHF and ongoing cardiac problems. ? ?05/31/2021: Today- follow-up ?Patient presents today with wife for follow-up after CPAP titration study which showed that his sleep apnea was not controlled on CPAP therapy so he was transitioned to BiPAP therapy. Since he was seen last, reports DOE and cough overall the same.  He has had progressive decline in activity tolerance over the last 6 months to a year, which we obtain PFTs for at his last visit that overall looked stable.  He recently wore a heart monitor due to bradycardia and hx of a fib; has yet to get the results.  Lower extremity swelling has mostly resolved.  He denies any hemoptysis, recent weight loss, anorexia, wheezing.  Feels like he sleeps relatively well at night.  Denies morning headaches or drowsy driving.  He continues on Anoro.  Rarely uses albuterol. ? ?Allergies  ?Allergen Reactions  ? Penicillins Other (See Comments)  ?  Put pt in hospital 50 yrs ago  ? Ace Inhibitors Cough  ? Darvocet [Propoxyphene N-Acetaminophen] Rash  ?  Tolerates tylenol  ? Sulfa Antibiotics Rash  ? ? ?Immunization History  ?Administered Date(s) Administered  ? Fluad Quad(high Dose 65+) 10/23/2020  ? Influenza Split 11/23/2015  ? Influenza, High Dose Seasonal PF 10/27/2013, 11/03/2014, 11/23/2015, 10/26/2016, 09/28/2018  ? Influenza,inj,Quad PF,6+ Mos 11/07/2010, 10/22/2012, 10/23/2020  ? Influenza-Unspecified 11/21/2013, 11/04/2016  ? PFIZER(Purple Top)SARS-COV-2 Vaccination 04/02/2019, 04/22/2019, 09/21/2019  ? Pneumococcal Conjugate-13 02/23/2016  ? Pneumococcal Polysaccharide-23 11/18/2011  ? Tdap 03/05/2011  ? ? ?Past Medical History:  ?Diagnosis Date  ? Atrial tachycardia ([place])   ? a. s/p DCCV 09/2009; b. s/p RFCA 03/2011; c. s/p DCCV 04/2011; d. s/p RFCA 09/17/11.  ? Cataract   ? Chronic systolic CHF (congestive heart failure) ([place])   ? a. 01/2015 Echo: EF 40-45%, antsept/infsept HK, triv AI, mild MR, mod dil LA, nl  RV, mildly dil RA.  ? CLL (chronic lymphoblastic leukemia)   ? Stage 0-1  ? Coronary artery disease involving native coronary artery without angina pectoris   ? a. Status post CABG in 1994:By Dr. Cyndia Bent. [facility] - L Cx, seqSVG-Diag-LAD, and SVG-PDA-PL.  ? Cough   ? Consistant with ACE inhibitor mediated cough  ? Dysrhythmia   ? Glaucoma (increased eye pressure) 1991  ? History of tobacco abuse   ? quit 1994  ? Hypercholesterolemia   ? Well Controlled  ? Hypertension   ? Ischemic cardiomyopathy   ? a. 02/2011 Echo: EF 40-45%;  b. 01/2015 Echo: EF 40-45%.  ? [place]  ? Hx of  ? Pericarditis   ? a. 01/2015-->Treated w/ ibuprofen/colchicine.  ? Sleep-disordered breathing 06/20/2011  ? ? ?Tobacco History: ?Social History  ? ?Tobacco Use  ?Smoking Status Former  ? Packs/day: 2.00  ? Years: 30.00  ? Pack years: 60.00  ? Types: Cigarettes  ? Quit date: 01/22/1992  ? Years since quitting: 29.3  ?Smokeless Tobacco Never  ? ?Counseling given: Not Answered ? ? ?Outpatient Medications Prior to Visit  ?Medication Sig Dispense Refill  ? albuterol (VENTOLIN HFA) 108 (90 Base) MCG/ACT inhaler Inhale 1 puff into the lungs every 6 (six) hours as needed  for wheezing or shortness of breath.    ? ANORO ELLIPTA 62.5-25 MCG/ACT AEPB USE 1 INHALATION DAILY 360 each 3  ? docusate sodium (COLACE) 100 MG capsule Take 100 mg by mouth 2 (two) times daily.    ? ENTRESTO 49-51 MG TAKE 1 TABLET TWICE A DAY 180 tablet 3  ? ezetimibe-simvastatin (VYTORIN) 10-20 MG tablet TAKE 1 TABLET AT BEDTIME (KEEP UPCOMING APPOINTMENT FOR REFILLS) 90 tablet 3  ? Fiber, Guar Gum, CHEW Chew 3 each by mouth daily.    ? fluticasone (FLONASE) 50 MCG/ACT nasal spray Place 2 sprays into both nostrils daily. 18.2 mL 3  ? furosemide (LASIX) 20 MG tablet Take 1 tablet (20 mg total) by mouth daily. 90 tablet 3  ? latanoprost (XALATAN) 0.005 % ophthalmic solution Place 1 drop into both eyes at bedtime.    ? loratadine (CLARITIN) 10 MG tablet Take 1 tablet (10 mg total) by mouth  daily. 90 tablet 3  ? Magnesium 250 MG TABS Take 500 mg by mouth at bedtime.    ? Melatonin 5 MG CHEW Chew 5 mg by mouth daily.    ? metoprolol succinate (TOPROL-XL) 50 MG 24 hr tablet TAKE 1 TABLET IN THE MOR

## 2021-05-31 NOTE — Assessment & Plan Note (Signed)
Appears compensated on exam today. Follow up with cardiology as scheduled.  

## 2021-05-31 NOTE — Assessment & Plan Note (Signed)
Asymptomatic bradycardia in the high 40's-50's today. Awaiting holter monitor results. Follow up with cardiology as scheduled.  ?

## 2021-06-01 NOTE — Telephone Encounter (Signed)
I left a message for the patient to call back 

## 2021-06-04 DIAGNOSIS — I10 Essential (primary) hypertension: Secondary | ICD-10-CM | POA: Diagnosis not present

## 2021-06-04 DIAGNOSIS — J8410 Pulmonary fibrosis, unspecified: Secondary | ICD-10-CM | POA: Diagnosis not present

## 2021-06-04 DIAGNOSIS — E785 Hyperlipidemia, unspecified: Secondary | ICD-10-CM | POA: Diagnosis not present

## 2021-06-04 DIAGNOSIS — R7301 Impaired fasting glucose: Secondary | ICD-10-CM | POA: Diagnosis not present

## 2021-06-04 DIAGNOSIS — R790 Abnormal level of blood mineral: Secondary | ICD-10-CM | POA: Diagnosis not present

## 2021-06-04 DIAGNOSIS — I2510 Atherosclerotic heart disease of native coronary artery without angina pectoris: Secondary | ICD-10-CM | POA: Diagnosis not present

## 2021-06-04 DIAGNOSIS — C9110 Chronic lymphocytic leukemia of B-cell type not having achieved remission: Secondary | ICD-10-CM | POA: Diagnosis not present

## 2021-06-04 DIAGNOSIS — D696 Thrombocytopenia, unspecified: Secondary | ICD-10-CM | POA: Diagnosis not present

## 2021-06-05 ENCOUNTER — Telehealth: Payer: Self-pay | Admitting: Cardiology

## 2021-06-05 ENCOUNTER — Other Ambulatory Visit: Payer: Self-pay | Admitting: Nurse Practitioner

## 2021-06-05 DIAGNOSIS — J849 Interstitial pulmonary disease, unspecified: Secondary | ICD-10-CM

## 2021-06-05 DIAGNOSIS — R001 Bradycardia, unspecified: Secondary | ICD-10-CM | POA: Diagnosis not present

## 2021-06-05 NOTE — Telephone Encounter (Signed)
Critical Zio monitor results. Call transferred to Triage ?

## 2021-06-05 NOTE — Telephone Encounter (Signed)
Received a call from [employer] calling to report patient's I R[employer] monitor is ready for review.Patient had 2 episodes of slow heart beat one on 5/17 rate 35 on 5/17 and 5/6 rate 38.I will send message to [facility]. ?

## 2021-06-05 NOTE — Telephone Encounter (Signed)
I have called the patient and adjusted his medication, see result section for the heart monitor report today for detail ?

## 2021-06-07 NOTE — Telephone Encounter (Signed)
Please call to make a follow up to go over his sleep study.

## 2021-06-08 NOTE — Telephone Encounter (Signed)
Patient had office visit with Roxan Diesel, NP on 05/31/2021. Please advise if anything additional is needed until patient gets BiPap machine.

## 2021-06-08 NOTE — Telephone Encounter (Signed)
Called patient and confirmed with him that the order for the Bipap was sent and received.  Called Rodney Burns from [employer] and he stated that he was approved for his Bipap and he is in line for PAP set up and should be getting a call soon to set him up.   Nothing further needed

## 2021-06-15 ENCOUNTER — Encounter (HOSPITAL_BASED_OUTPATIENT_CLINIC_OR_DEPARTMENT_OTHER): Payer: Medicare Other | Admitting: Pulmonary Disease

## 2021-06-23 NOTE — Progress Notes (Unsigned)
Cardiology Office Note:    Date:  06/27/2021   ID:  FRANCOIS ELK, DOB [birth year], MRN [id number]  PCP:  [name], MD  [facility] Cardiologist:  Clell Trahan Martinique, MD  [facility] Electrophysiologist:  None   Referring MD: [name], MD   Chief Complaint  Patient presents with   Follow-up    1-2 months.   Dizziness   Coronary Artery Disease    History of Present Illness:    Rodney Burns is a 76 y.o. male with a hx of atrial flutter, HTN, HLD, CAD s/p CABG 1994, pericarditis in January 6237, chronic systolic heart failure, OSA on CPAP, CLL and thoracic aortic aneurysm.  He had radiofrequency ablation in 2011 of atrial tachycardia/flutter however had recurrent atrial flutter after the ablation. He had repeat ablation of another atrial tachy/flutter in 2013 by Dr Rayann Heman.   Myoview in March 2016 showed inferior and anteroapical infarct without ischemia, EF 32%.  Echocardiogram obtained in January 2020 due to pericarditis showed EF 40 to 45%, hypokinesis of the anteroseptal and inferoseptal myocardium, trivial AI, mild MR.  Repeat echocardiogram in October 2020 showed EF 40 to 45%, hypokinesis of the anteroseptal and inferoseptal myocardium, grade 1 DD, mild MR, 40 mm aortic root dilatation.    He was tested positive for COVID-19 in December 2020 and has had a significant acute hypoxic respiratory failure.  He also had a significant transaminitis and was unable to get remdesivir or Actemra.  Toprol-XL was increased and he was given IV digoxin.  CTA January 2021 showed pulmonary fibrosis.  He has been able to wean off of oxygen.  He also completed pulmonary rehab.  Repeat echocardiogram obtained in March 2021 showed EF 35 to 40%, global hypokinesis, normal pulmonary artery systolic pressure, trivial AI, mild dilatation of the aortic root measuring at 38m.  Spironolactone was added to his medical regimen at the time.  Note, he was in atrial flutter during the echocardiogram even though  EKG prior to that in December 2020 showed a sinus rhythm. When seen in October 2021, EKG showed he was back in sinus rhythm.  He was also having symptomatic orthostatic hypotension at the time and bradycardia, spironolactone was decreased to 12.5 mg daily, Toprol-XL decreased to 50 mg daily. When seen last year digoxin was discontinued. He was felt to be in NSR at that time but no Ecg done.  He was seen by me in February 2023, at which time he complained of dizziness and lightheadedness.  He gained about 8 pounds.  proBNP was elevated at 638.  Spironolactone was increased to 25 mg daily.  Lasix 20 mg daily was added to his medical regimen.  EKG showed the patient was in controlled atrial fibrillation which may have contributed to his symptoms.  Outpatient cardioversion was scheduled for 3/16 however later canceled as patient arrived in sinus rhythm.  Repeat echocardiogram obtained on 03/22/2021 demonstrated EF 35%, global hypokinesis, mild MR, dilated aortic root at 40 mm.  When see in follow up he was bradycardic so a heart monitor was placed. This showed recurrent Afib with slow rates at times so beta blocker was reduced. Consideration for AAD limited to Tikosyn due to co-morbidities. Seen today for follow up.   He states he is doing well. Scheduled for follow up CT chest for his pulmonary fibrosis. Dyspnea really hasn't changed. Only gets lightheaded when bending over. PFTs in April were stable. Does complain of feeling tired but states this didn't change even  when in NSR. HR monitor at home shows range of HR from 45 to 107.     Past Medical History:  Diagnosis Date   Atrial tachycardia ([place])    a. s/p DCCV 09/2009; b. s/p RFCA 03/2011; c. s/p DCCV 04/2011; d. s/p RFCA 09/17/11.   Cataract    Chronic systolic CHF (congestive heart failure) ([place])    a. 01/2015 Echo: EF 40-45%, antsept/infsept HK, triv AI, mild MR, mod dil LA, nl RV, mildly dil RA.   CLL (chronic lymphoblastic leukemia)    Stage 0-1    Coronary artery disease involving native coronary artery without angina pectoris    a. Status post CABG in 1994:By Dr. Cyndia Bent. LIMA - L Cx, seqSVG-Diag-LAD, and SVG-PDA-PL.   Cough    Consistant with ACE inhibitor mediated cough   Dysrhythmia    Glaucoma (increased eye pressure) 1991   History of tobacco abuse    quit 1994   Hypercholesterolemia    Well Controlled   Hypertension    Ischemic cardiomyopathy    a. 02/2011 Echo: EF 40-45%;  b. 01/2015 Echo: EF 40-45%.   Malaria 1972   Hx of   Pericarditis    a. 01/2015-->Treated w/ ibuprofen/colchicine.   Sleep-disordered breathing 06/20/2011    Past Surgical History:  Procedure Laterality Date   ATRIAL FLUTTER ABLATION N/A 04/11/2011   Procedure: ATRIAL FLUTTER ABLATION;  Surgeon: Deboraha Sprang, MD;  Location: [facility] CATH LAB;  Service: Cardiovascular;  Laterality: N/A;   ATRIAL FLUTTER ABLATION N/A 09/17/2011   Procedure: ATRIAL FLUTTER ABLATION;  Surgeon: Thompson Grayer, MD;  Location: [facility] CATH LAB;  Service: Cardiovascular;  Laterality: N/A;   CARDIOVERSION  06/03/2011   Procedure: CARDIOVERSION;  Surgeon: Deboraha Sprang, MD;  Location: [place];  Service: Cardiovascular;  Laterality: N/A;   CATARACT EXTRACTION     [place]   By Dr. Cyndia Bent. LIMA - L Cx, seqSVG-Diag-LAD, and SVG-PDA-PL   ELECTROPHYSIOLOGY STUDY N/A 04/11/2011   Procedure: ELECTROPHYSIOLOGY STUDY;  Surgeon: Deboraha Sprang, MD;  Location: [facility] CATH LAB;  Service: Cardiovascular;  Laterality: N/A;   EP study and ablation  2013   by Dr Caryl Comes, repeat ablation by Dr Rayann Heman   NM MYOVIEW LTD  03/2014   scar in the inferior and anteroapical regions without ischemia. This is similar to finding on Myoview in 2013. EF is a little lower 39%>>32%.   TRIGGER FINGER RELEASE  12/2016   left hand    US ECHOCARDIOGRAPHY  09-07-09; 02/2011   a. Est EF 40-45%; b. EF 40-45%. Gr 2 DD. Mild LA dil    Current Medications: Current Meds  Medication Sig   ANORO ELLIPTA 62.5-25  MCG/ACT AEPB USE 1 INHALATION DAILY   docusate sodium (COLACE) 100 MG capsule Take 100 mg by mouth 2 (two) times daily.   ENTRESTO 49-51 MG TAKE 1 TABLET TWICE A DAY   ezetimibe-simvastatin (VYTORIN) 10-20 MG tablet TAKE 1 TABLET AT BEDTIME (KEEP UPCOMING APPOINTMENT FOR REFILLS)   Fiber, Guar Gum, CHEW Chew 3 each by mouth daily.   fluticasone (FLONASE) 50 MCG/ACT nasal spray Place 2 sprays into both nostrils daily.   Fluticasone-Umeclidin-Vilant (TRELEGY ELLIPTA) 100-62.5-25 MCG/ACT AEPB Inhale 1 puff into the lungs daily.   furosemide (LASIX) 20 MG tablet Take 1 tablet (20 mg total) by mouth daily.   Iron, Ferrous Sulfate, 325 (65 Fe) MG TABS Take 1 tablet by mouth daily.   latanoprost (XALATAN) 0.005 % ophthalmic solution Place 1 drop into both  eyes at bedtime.   loratadine (CLARITIN) 10 MG tablet Take 1 tablet (10 mg total) by mouth daily.   Melatonin 5 MG CHEW Chew 5 mg by mouth daily.   metoprolol succinate (TOPROL-XL) 50 MG 24 hr tablet TAKE 1 TABLET IN THE MORNING AND ONE-HALF (1/2) TABLET IN THE EVENING (Patient taking differently: Take 50 mg by mouth daily.)   pramipexole (MIRAPEX) 1.5 MG tablet Take 1.5 mg by mouth at bedtime.   RABEprazole (ACIPHEX) 20 MG tablet Take 1 tablet (20 mg total) by mouth daily.   spironolactone (ALDACTONE) 25 MG tablet TAKE 1 TABLET DAILY   XARELTO 20 MG TABS tablet TAKE 1 TABLET DAILY WITH SUPPER     Allergies:   Penicillins, Ace inhibitors, Darvocet [propoxyphene n-acetaminophen], and Sulfa antibiotics   Social History   Socioeconomic History   Marital status: Married    Spouse name: Not on file   Number of children: 2   Years of education: Not on file   Highest education level: Not on file  Occupational History   Occupation: Careers adviser: RETIRED  Tobacco Use   Smoking status: Former    Packs/day: 2.00    Years: 30.00    Pack years: 60.00    Types: Cigarettes    Quit date: 01/22/1992    Years since quitting: 29.4    Smokeless tobacco: Never  Vaping Use   Vaping Use: Never used  Substance and Sexual Activity   Alcohol use: No   Drug use: No   Sexual activity: Yes  Other Topics Concern   Not on file  Social History Narrative   Not on file   Social Determinants of Health   Financial Resource Strain: Not on file  Food Insecurity: Not on file  Transportation Needs: Not on file  Physical Activity: Not on file  Stress: Not on file  Social Connections: Not on file     Family History: The patient's family history includes Cancer in his brother; Down syndrome in his son; Heart failure in his father.  ROS:   Please see the history of present illness.     All other systems reviewed and are negative.  EKGs/Labs/Other Studies Reviewed:    The following studies were reviewed today:  Echo 04/14/2019 1. Left ventricular ejection fraction, by estimation, is 35 to 40%. The  left ventricle has moderately decreased function. The left ventricle  demonstrates global hypokinesis. Left ventricular diastolic parameters are  indeterminate.   2. Right ventricular systolic function is mildly reduced. The right  ventricular size is normal. There is normal pulmonary artery systolic  pressure. The estimated right ventricular systolic pressure is 74.1 mmHg.   3. The mitral valve is normal in structure. Trivial mitral valve  regurgitation. No evidence of mitral stenosis.   4. The aortic valve is tricuspid. Aortic valve regurgitation is trivial.  Mild aortic valve sclerosis is present, with no evidence of aortic valve  stenosis.   5. Aortic dilatation noted. There is mild dilatation of the aortic root  measuring 37 mm.   6. The inferior vena cava is normal in size with greater than 50%  respiratory variability, suggesting right atrial pressure of 3 mmHg.   7. The patient was in atrial flutter.   Echo 03/22/21: IMPRESSIONS Left ventricular ejection fraction, by estimation, is 35%. The left ventricle  has moderately decreased function. The left ventricle demonstrates global hypokinesis. The left ventricular internal cavity size was mildly dilated. Left ventricular diastolic parameters are indeterminate. 1.  Right ventricular systolic function is normal. The right ventricular size is mildly enlarged. Tricuspid regurgitation signal is inadequate for assessing PA pressure. 2. 3. Left atrial size was mildly dilated. 4. Right atrial size was mildly dilated. The mitral valve is normal in structure. Mild mitral valve regurgitation. No evidence of mitral stenosis. 5. The aortic valve is tricuspid. There is mild calcification of the aortic valve. Aortic valve regurgitation is trivial. Aortic valve sclerosis/calcification is present, without any evidence of aortic stenosis. 6. 7. Aortic dilatation noted. There is mild dilatation of the aortic root, measuring 40 mm. The inferior vena cava is dilated in size with >50% respiratory variability, suggesting right atrial pressure of 8 mmHg. 8. 9. The patient was in atrial fibrillation.  EKG:  EKG is not ordered today.    Recent Labs: 03/09/2021: Pro B Natriuretic peptide (BNP) 638.0 03/20/2021: BNP 304.0; TSH 1.780 03/22/2021: ALT 19; BUN 21; Creatinine 1.05; Hemoglobin 13.9; Platelet Count 122; Potassium 4.7; Sodium 140  Recent Lipid Panel    Component Value Date/Time   CHOL 108 10/16/2017 1435   TRIG 93 12/24/2018 1134   HDL 42 10/16/2017 1435   LDLCALC 55 10/16/2017 1435   Dated 10/23/20: cholesterol 111, triglycerides 46, HDL 45, LDL 55. Dated 05/30/21: A1c 6.3%. cholesterol 105, triglycerides 58, HDL 38, LDL 54. CMET and CBC normal.  Risk Assessment/Calculations:       Physical Exam:    VS:  BP 110/60 (BP Location: Left Arm, Patient Position: Sitting, Cuff Size: Normal)   Pulse (!) 54   Ht '5\' 10"'$  (1.778 m)   Wt 210 lb (95.3 kg)   BMI 30.13 kg/m     Wt Readings from Last 3 Encounters:  06/27/21 210 lb (95.3 kg)  05/31/21 209 lb  9.6 oz (95.1 kg)  05/23/21 210 lb 6.4 oz (95.4 kg)     GEN:  Well nourished, well developed in no acute distress HEENT: Normal NECK: No JVD; No carotid bruits LYMPHATICS: No lymphadenopathy CARDIAC: IRRR, no murmurs, rubs, gallops RESPIRATORY:  Clear to auscultation without rales, wheezing or rhonchi  ABDOMEN: Soft, non-tender, non-distended MUSCULOSKELETAL:  No edema; No deformity  SKIN: Warm and dry NEUROLOGIC:  Alert and oriented x 3 PSYCHIATRIC:  Normal affect   ASSESSMENT:    1. Persistent atrial fibrillation ([place])   2. Coronary artery disease involving coronary bypass graft of native heart without angina pectoris   3. Chronic systolic CHF (congestive heart failure) ([place])   4. Hx of CABG x 5 '94      PLAN:    In order of problems listed above:  Atrial fibrillation with controlled response. On reduced beta blocker dose. On Xarelto. We discussed further attempts at restoring NSR. Tikosyn would be the only AAD option. Could consider ablation (remote ablation x 2 for atrial flutter/tachycardia. It is not clear that Afib is symptomatic. After discussion he would like to continue with rate control strategy and I think this is reasonable.   Chronic combined systolic/diastolic CHF. EF 35%. On Entresto and Toprol. Unable to titrate with low BP.  Continue lasix 20 mg daily and low dose aldactone. Appears euvolemic today.   CAD s/p remote CABG in 1994. No active angina.   Hyperlipidemia. On statin check lab.   Remote atrial flutter/tachycardia s/p ablation x 2.   6.   HTN controlled.      Follow up in 6 months.    Medication Adjustments/Labs and Tests Ordered: Current medicines are reviewed at length with the patient today.  Concerns  regarding medicines are outlined above.  No orders of the defined types were placed in this encounter.  No orders of the defined types were placed in this encounter.   Patient Instructions  Medication Instructions:  NO CHANGES  *If you  need a refill on your cardiac medications before your next appointment, please call your pharmacy*   Follow-Up: At [facility], you and your health needs are our priority.  As part of our continuing mission to provide you with exceptional heart care, we have created designated Provider Care Teams.  These Care Teams include your primary Cardiologist (physician) and Advanced Practice Providers (APPs -  Physician Assistants and Nurse Practitioners) who all work together to provide you with the care you need, when you need it.  We recommend signing up for the patient portal called "MyChart".  Sign up information is provided on this After Visit Summary.  MyChart is used to connect with patients for Virtual Visits (Telemedicine).  Patients are able to view lab/test results, encounter notes, upcoming appointments, etc.  Non-urgent messages can be sent to your provider as well.   To learn more about what you can do with MyChart, go to [email or web address].    Your next appointment:   6 months with Dr. Martinique   Signed, Etta Gassett Martinique, MD  06/27/2021 11:56 AM    [place]

## 2021-06-27 ENCOUNTER — Ambulatory Visit
Admission: RE | Admit: 2021-06-27 | Discharge: 2021-06-27 | Disposition: A | Discharge status: Home / Self Care | Payer: Medicare Other | Source: Ambulatory Visit | Attending: Nurse Practitioner | Admitting: Nurse Practitioner

## 2021-06-27 ENCOUNTER — Ambulatory Visit (INDEPENDENT_AMBULATORY_CARE_PROVIDER_SITE_OTHER): Payer: Medicare Other | Admitting: Cardiology

## 2021-06-27 ENCOUNTER — Encounter: Payer: Self-pay | Admitting: Cardiology

## 2021-06-27 VITALS — BP 110/60 | HR 54 | Ht 70.0 in | Wt 210.0 lb

## 2021-06-27 DIAGNOSIS — Z951 Presence of aortocoronary bypass graft: Secondary | ICD-10-CM

## 2021-06-27 DIAGNOSIS — I4819 Other persistent atrial fibrillation: Secondary | ICD-10-CM

## 2021-06-27 DIAGNOSIS — I700 Atherosclerosis of aorta: Secondary | ICD-10-CM | POA: Diagnosis not present

## 2021-06-27 DIAGNOSIS — I25810 Atherosclerosis of coronary artery bypass graft(s) without angina pectoris: Secondary | ICD-10-CM | POA: Diagnosis not present

## 2021-06-27 DIAGNOSIS — J432 Centrilobular emphysema: Secondary | ICD-10-CM | POA: Diagnosis not present

## 2021-06-27 DIAGNOSIS — J479 Bronchiectasis, uncomplicated: Secondary | ICD-10-CM | POA: Diagnosis not present

## 2021-06-27 DIAGNOSIS — I5022 Chronic systolic (congestive) heart failure: Secondary | ICD-10-CM | POA: Diagnosis not present

## 2021-06-27 DIAGNOSIS — J929 Pleural plaque without asbestos: Secondary | ICD-10-CM | POA: Diagnosis not present

## 2021-06-27 DIAGNOSIS — J849 Interstitial pulmonary disease, unspecified: Secondary | ICD-10-CM

## 2021-06-27 NOTE — Patient Instructions (Signed)
Medication Instructions:  NO CHANGES  *If you need a refill on your cardiac medications before your next appointment, please call your pharmacy*   Follow-Up: At [facility], you and your health needs are our priority.  As part of our continuing mission to provide you with exceptional heart care, we have created designated Provider Care Teams.  These Care Teams include your primary Cardiologist (physician) and Advanced Practice Providers (APPs -  Physician Assistants and Nurse Practitioners) who all work together to provide you with the care you need, when you need it.  We recommend signing up for the patient portal called "MyChart".  Sign up information is provided on this After Visit Summary.  MyChart is used to connect with patients for Virtual Visits (Telemedicine).  Patients are able to view lab/test results, encounter notes, upcoming appointments, etc.  Non-urgent messages can be sent to your provider as well.   To learn more about what you can do with MyChart, go to [email or web address].    Your next appointment:   6 months with Dr. Martinique

## 2021-06-29 NOTE — Progress Notes (Signed)
Reviewed HRCT chest with patient. Overall unchanged appearance of the lungs with fibrosis and emphysema. No evidence of progression of disease which is in line with his  PFTs. Did have some mild dilatation of his pulmonic trunk. Most recent echo was unable to measure PASP. I suspect his progressive DOE over the past few months is related to his recent a fib dx, CHF with EF 35%, and uncontrolled sleep apnea. We did discuss that he is at increased risk for [facility], so may need to consider right heart cath if symptoms persist or continue to worsen after he is well-controlled on BiPAP and a fib under controlled. He has yet to receive his BiPAP machine - will follow up with [employer] for him today. Plan for follow up in 3 months to see how he's doing. Pt verbalized understanding.

## 2021-07-17 IMAGING — DX DG CHEST 1V PORT
1 series · 1 of 1 positions shown · non-contrast
Comparison: 03/20/2017

CLINICAL DATA: Cough, shortness of breath

EXAM:
PORTABLE CHEST 1 VIEW

[chest ap]
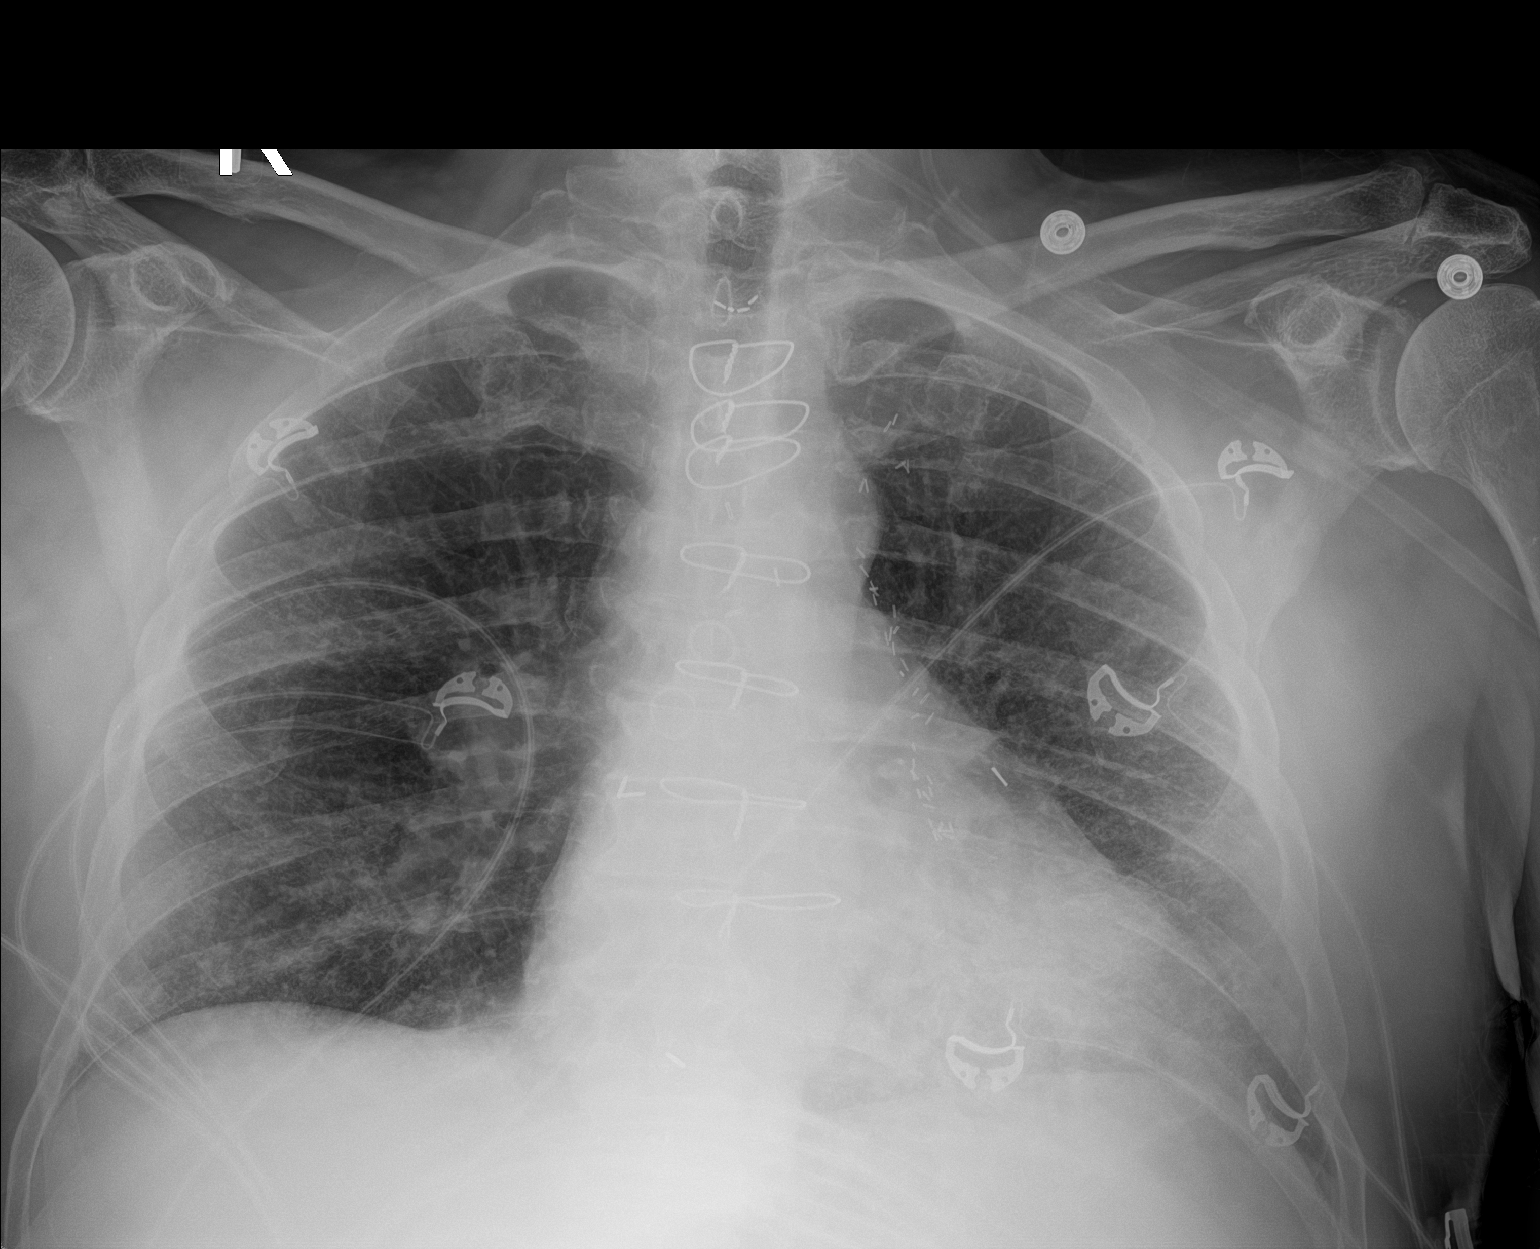

[1 of 1 positions shown; findings below may reference images not displayed]

FINDINGS: Post CABG changes. Stable mild cardiomegaly. No focal airspace
consolidation. No pleural effusion or pneumothorax.
IMPRESSION: No acute cardiopulmonary findings.

## 2021-07-31 DIAGNOSIS — L821 Other seborrheic keratosis: Secondary | ICD-10-CM | POA: Diagnosis not present

## 2021-07-31 DIAGNOSIS — C44319 Basal cell carcinoma of skin of other parts of face: Secondary | ICD-10-CM | POA: Diagnosis not present

## 2021-08-11 IMAGING — CR DG CHEST 2V
2 series · 2 of 2 positions shown · non-contrast
Comparison: 12/26/2018

CLINICAL DATA: 73-year-old male with a history 0BPJ3-J1 infection

EXAM:
CHEST - 2 VIEW

[w chest pa]
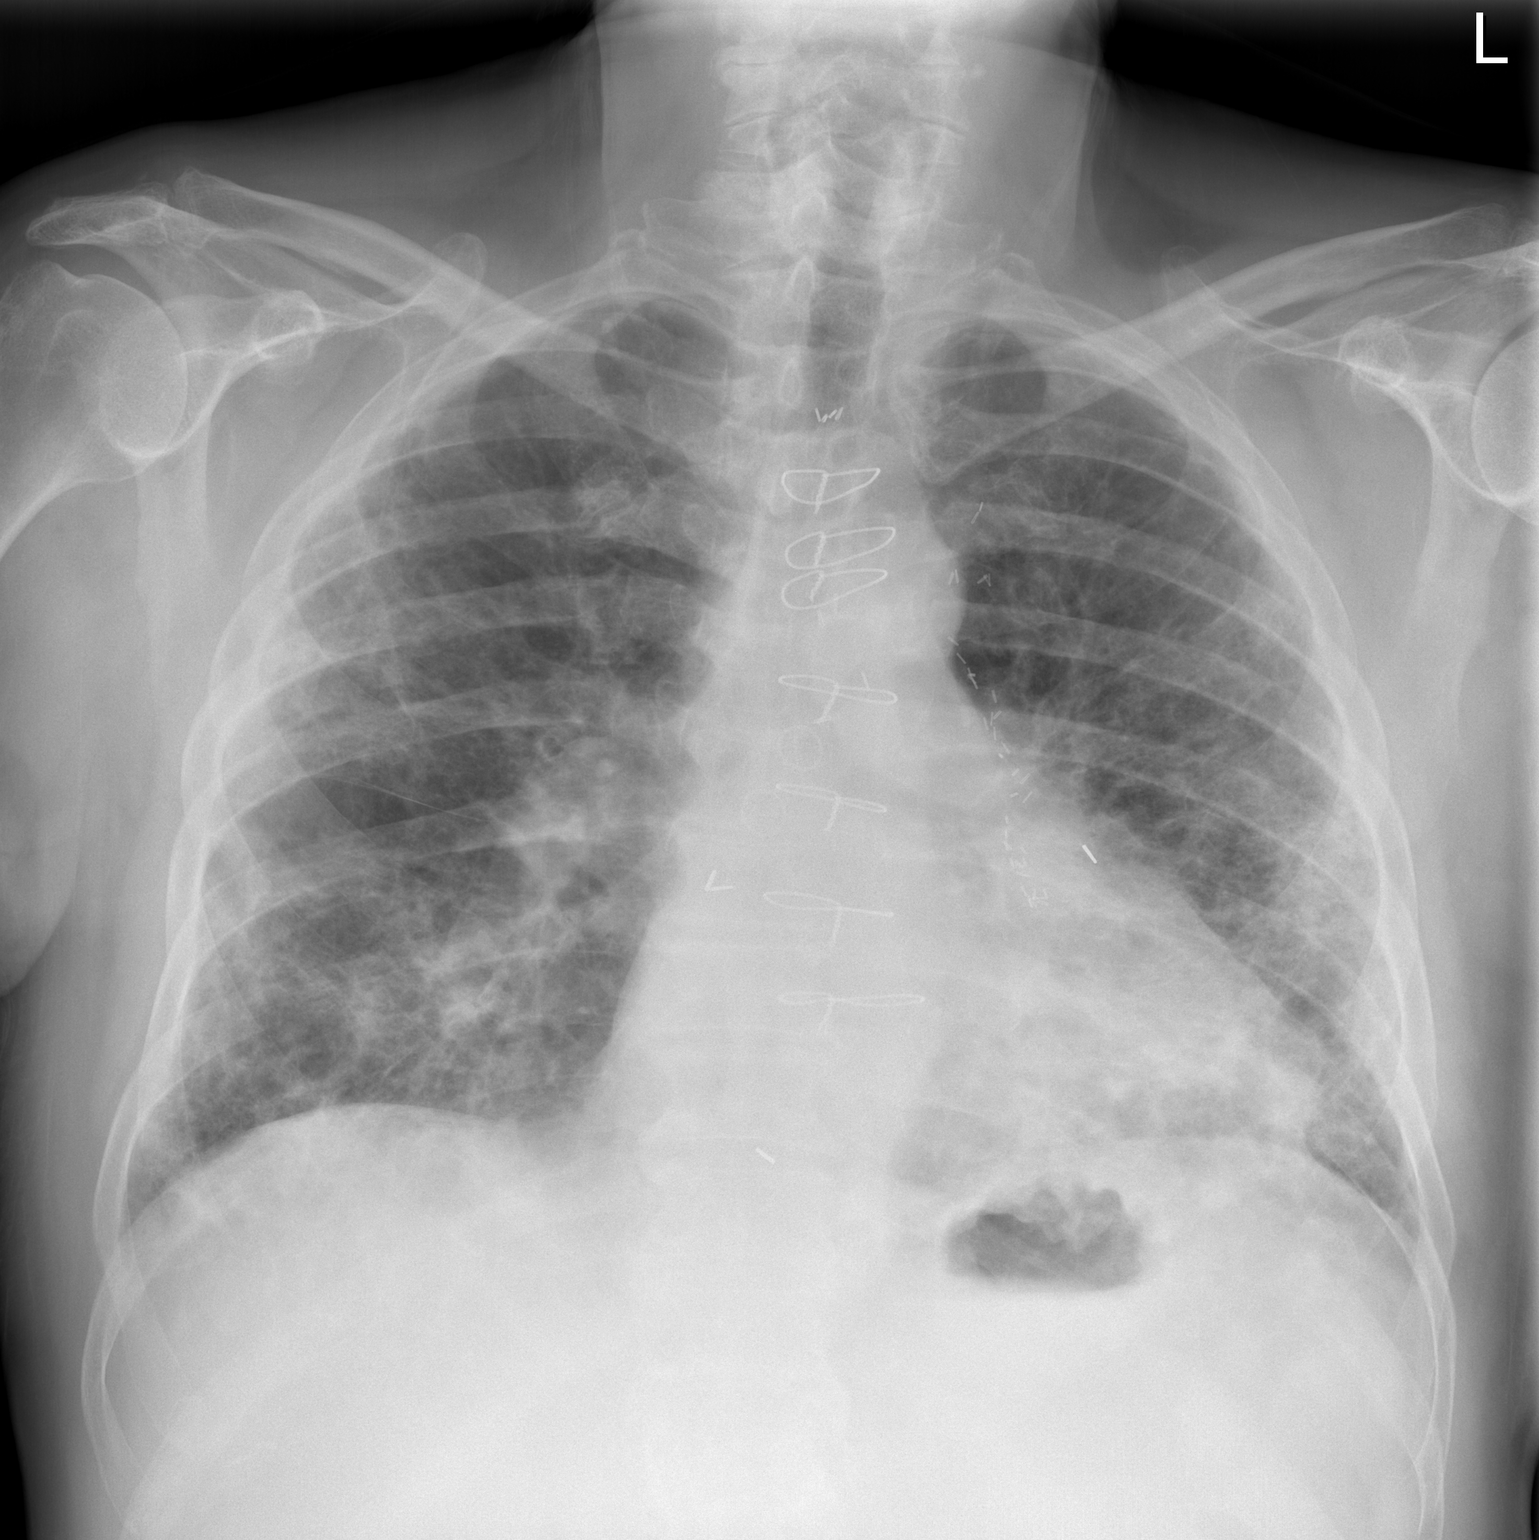

[w chest lat]
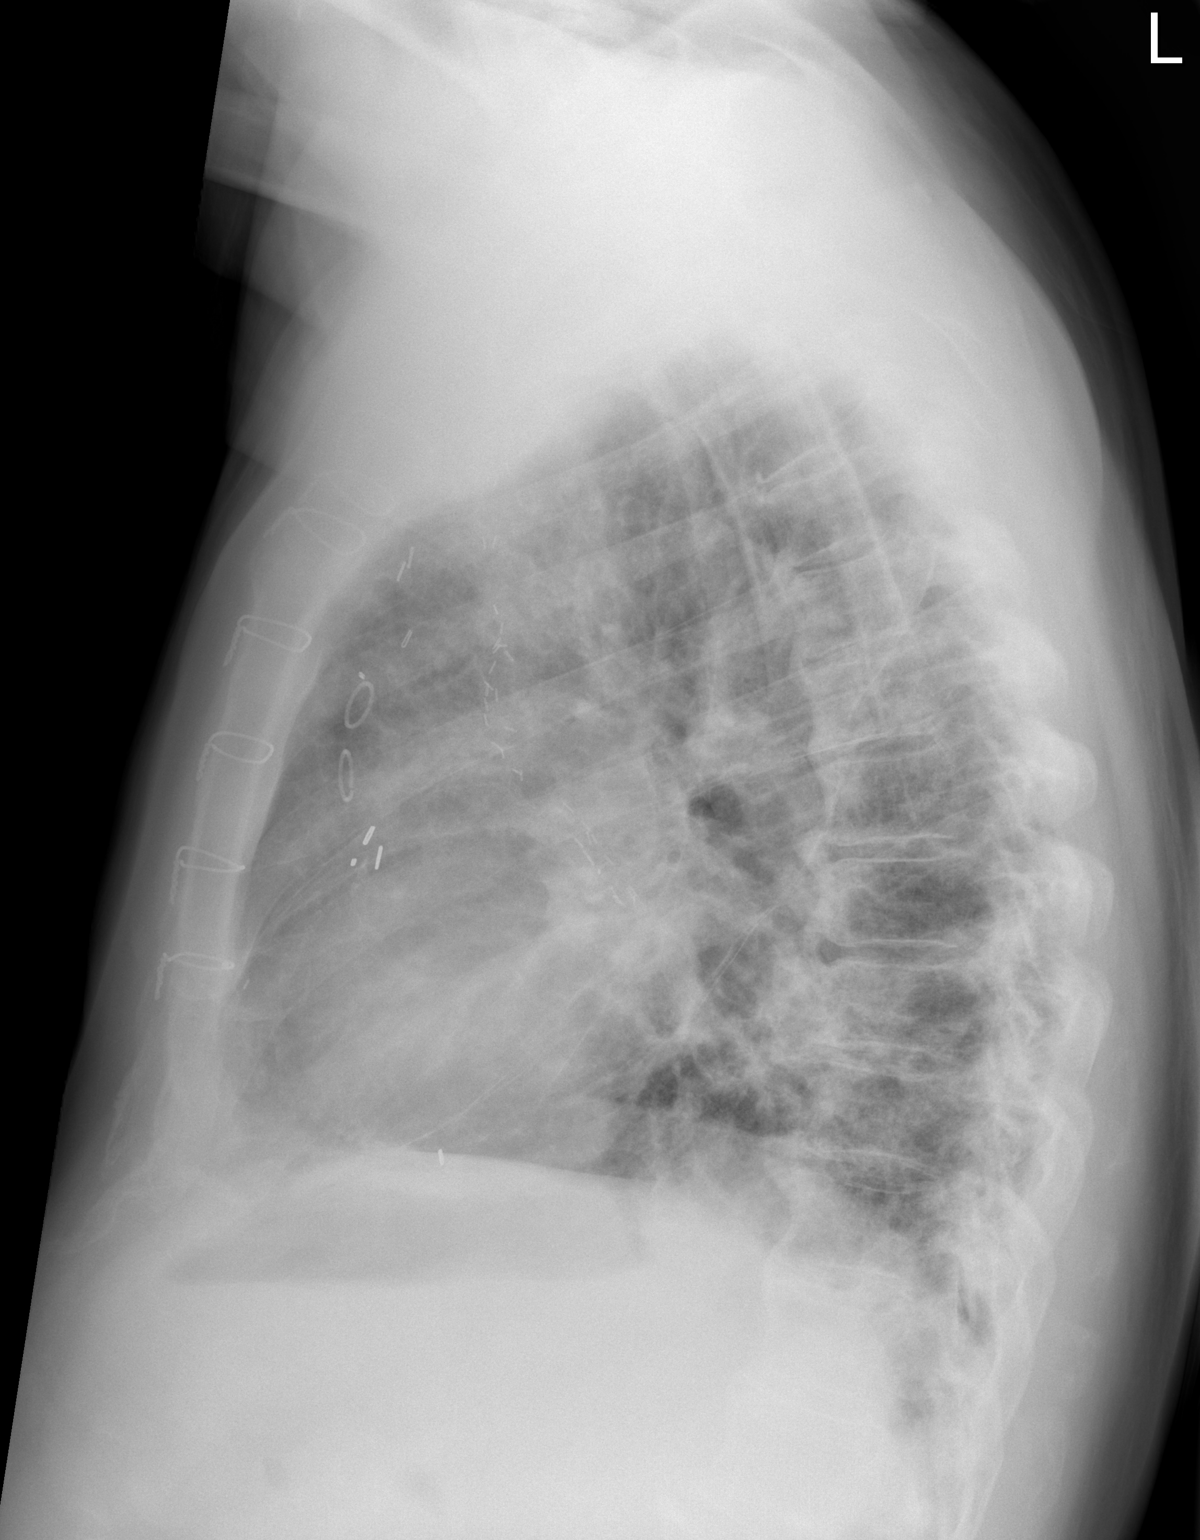

[2 of 2 positions shown; findings below may reference images not displayed]

FINDINGS: Cardiomediastinal silhouette unchanged in size and contour. Surgical
changes of median sternotomy and CABG.

Since the prior plain film there has been progression of
reticulonodular pattern of opacity, predominantly in the periphery
of the bilateral lungs and bases of the bilateral lungs. No pleural
effusion or pneumothorax.

No displaced fracture.  Degenerative changes of the spine.
IMPRESSION: Progression of reticulonodular opacities in the periphery and bases
of the bilateral lungs, compatible with multifocal infection.

Surgical changes of median sternotomy and CABG.

## 2021-09-13 ENCOUNTER — Other Ambulatory Visit: Payer: Self-pay | Admitting: Gastroenterology

## 2021-09-13 DIAGNOSIS — Z86010 Personal history of colonic polyps: Secondary | ICD-10-CM | POA: Diagnosis not present

## 2021-09-13 DIAGNOSIS — R194 Change in bowel habit: Secondary | ICD-10-CM | POA: Diagnosis not present

## 2021-09-13 DIAGNOSIS — Z800 Family history of malignant neoplasm of digestive organs: Secondary | ICD-10-CM | POA: Diagnosis not present

## 2021-09-13 DIAGNOSIS — Z7901 Long term (current) use of anticoagulants: Secondary | ICD-10-CM | POA: Diagnosis not present

## 2021-09-14 ENCOUNTER — Telehealth: Payer: Self-pay | Admitting: *Deleted

## 2021-09-14 NOTE — Telephone Encounter (Signed)
   Pre-operative Risk Assessment    Patient Name: Rodney Burns  DOB: 08-20-1945 MRN: [id number]      Request for Surgical Clearance    Procedure:   COLONOSCOPY : CHANGE IN BOWEL HABITS. H/O COLON POLYPS, F/HX COLON CANCER  Date of Surgery:  Clearance 09/26/21                                 Surgeon:  DR/ Paulita Fujita Surgeon's Group or Practice Name:  [facility] Phone number:  [phone] Fax number:  [phone]   Type of Clearance Requested:   - Medical  - Pharmacy:  Hold Rivaroxaban (Xarelto) (STOP DATE 10/13/21; RESUME 09/27/21   Type of Anesthesia:  Not Indicated (PROPOFOL?)   Additional requests/questions:    Jiles Prows   09/14/2021, 1:49 PM

## 2021-09-14 NOTE — Telephone Encounter (Signed)
Patient with diagnosis of atrial fibrillation on Xarelto for anticoagulation.    Procedure: colonoscopy Date of procedure: 09/26/21   CHA2DS2-VASc Score = 5   This indicates a 7.2% annual risk of stroke. The patient's score is based upon: CHF History: 1 HTN History: 1 Diabetes History: 0 Stroke History: 0 Vascular Disease History: 1 Age Score: 2 Gender Score: 0   CrCl 68 mL/min (Scr 1.05 03/22/21 using adjusted BW)   Per office protocol, patient can hold Xarelto for 2 days prior to procedure.   Patient will not need bridging with Lovenox (enoxaparin) around procedure.  **This guidance is not considered finalized until pre-operative APP has relayed final recommendations.**

## 2021-09-17 ENCOUNTER — Telehealth: Payer: Self-pay | Admitting: *Deleted

## 2021-09-17 NOTE — Telephone Encounter (Signed)
Pt agreeable to plan of care for tele pre op appt 09/19/21. Schedules are all full for the rest of this week. We had to double book the schedule to as the office is closed on 09/24/21 for Labor Day. Pt procedure is 09/26/21.  Med rec and consent are done.

## 2021-09-17 NOTE — Telephone Encounter (Signed)
Pt agreeable to plan of care for tele pre op appt 09/19/21. Schedules are all full for the rest of this week. We had to double book the schedule to as the office is closed on 09/24/21 for Labor Day. Pt procedure is 09/26/21.  Med rec and consent are done.     Patient Consent for Virtual Visit        Rodney Burns has provided verbal consent on 09/17/2021 for a virtual visit (video or telephone).   CONSENT FOR VIRTUAL VISIT FOR:  Rodney Burns  By participating in this virtual visit I agree to the following:  I hereby voluntarily request, consent and authorize [facility] and its employed or contracted physicians, physician assistants, nurse practitioners or other licensed health care professionals (the Practitioner), to provide me with telemedicine health care services (the "Services") as deemed necessary by the treating Practitioner. I acknowledge and consent to receive the Services by the Practitioner via telemedicine. I understand that the telemedicine visit will involve communicating with the Practitioner through live audiovisual communication technology and the disclosure of certain medical information by electronic transmission. I acknowledge that I have been given the opportunity to request an in-person assessment or other available alternative prior to the telemedicine visit and am voluntarily participating in the telemedicine visit.  I understand that I have the right to withhold or withdraw my consent to the use of telemedicine in the course of my care at any time, without affecting my right to future care or treatment, and that the Practitioner or I may terminate the telemedicine visit at any time. I understand that I have the right to inspect all information obtained and/or recorded in the course of the telemedicine visit and may receive copies of available information for a reasonable fee.  I understand that some of the potential risks of receiving the Services via telemedicine  include:  Delay or interruption in medical evaluation due to technological equipment failure or disruption; Information transmitted may not be sufficient (e.g. poor resolution of images) to allow for appropriate medical decision making by the Practitioner; and/or  In rare instances, security protocols could fail, causing a breach of personal health information.  Furthermore, I acknowledge that it is my responsibility to provide information about my medical history, conditions and care that is complete and accurate to the best of my ability. I acknowledge that Practitioner's advice, recommendations, and/or decision may be based on factors not within their control, such as incomplete or inaccurate data provided by me or distortions of diagnostic images or specimens that may result from electronic transmissions. I understand that the practice of medicine is not an exact science and that Practitioner makes no warranties or guarantees regarding treatment outcomes. I acknowledge that a copy of this consent can be made available to me via my patient portal ([facility]), or I can request a printed copy by calling the office of [facility].    I understand that my insurance will be billed for this visit.   I have read or had this consent read to me. I understand the contents of this consent, which adequately explains the benefits and risks of the Services being provided via telemedicine.  I have been provided ample opportunity to ask questions regarding this consent and the Services and have had my questions answered to my satisfaction. I give my informed consent for the services to be provided through the use of telemedicine in my medical care

## 2021-09-17 NOTE — Telephone Encounter (Signed)
   Name: Rodney Burns  DOB: 02-10-1945  MRN: [id number]  Primary Cardiologist: [name], MD  Preoperative team, please contact this patient and set up a phone call appointment for further preoperative risk assessment. Please obtain consent and complete medication review. Thank you for your help.  I confirm that guidance regarding antiplatelet and oral anticoagulation therapy has been completed and, if necessary, noted below.  Patient with diagnosis of atrial fibrillation on Xarelto for anticoagulation.     Procedure: colonoscopy Date of procedure: 09/26/21     CHA2DS2-VASc Score = 5   This indicates a 7.2% annual risk of stroke. The patient's score is based upon: CHF History: 1 HTN History: 1 Diabetes History: 0 Stroke History: 0 Vascular Disease History: 1 Age Score: 2 Gender Score: 0     CrCl 68 mL/min (Scr 1.05 03/22/21 using adjusted BW)    Per office protocol, patient can hold Xarelto for 2 days prior to procedure.   Patient will not need bridging with Lovenox (enoxaparin) around procedure.  Richardson Dopp, PA-C 09/17/2021, 11:56 AM [place]

## 2021-09-18 ENCOUNTER — Encounter (HOSPITAL_COMMUNITY): Payer: Self-pay | Admitting: Gastroenterology

## 2021-09-19 ENCOUNTER — Ambulatory Visit: Payer: Medicare Other | Attending: Cardiology | Admitting: Physician Assistant

## 2021-09-19 ENCOUNTER — Encounter: Payer: Self-pay | Admitting: Physician Assistant

## 2021-09-19 ENCOUNTER — Ambulatory Visit (INDEPENDENT_AMBULATORY_CARE_PROVIDER_SITE_OTHER): Payer: Medicare Other | Admitting: Nurse Practitioner

## 2021-09-19 ENCOUNTER — Encounter: Payer: Self-pay | Admitting: Nurse Practitioner

## 2021-09-19 VITALS — BP 104/60 | HR 69 | Ht 70.0 in | Wt 212.6 lb

## 2021-09-19 DIAGNOSIS — Z01810 Encounter for preprocedural cardiovascular examination: Secondary | ICD-10-CM | POA: Diagnosis not present

## 2021-09-19 DIAGNOSIS — J432 Centrilobular emphysema: Secondary | ICD-10-CM

## 2021-09-19 DIAGNOSIS — I5022 Chronic systolic (congestive) heart failure: Secondary | ICD-10-CM

## 2021-09-19 DIAGNOSIS — G4733 Obstructive sleep apnea (adult) (pediatric): Secondary | ICD-10-CM | POA: Diagnosis not present

## 2021-09-19 DIAGNOSIS — I4821 Permanent atrial fibrillation: Secondary | ICD-10-CM

## 2021-09-19 DIAGNOSIS — J849 Interstitial pulmonary disease, unspecified: Secondary | ICD-10-CM | POA: Diagnosis not present

## 2021-09-19 NOTE — Assessment & Plan Note (Signed)
Appears compensated on exam today. Follow up with cardiology as scheduled.

## 2021-09-19 NOTE — Assessment & Plan Note (Signed)
Irregular rhythm, rate controlled. Continue metoprolol for rate control and Xarelto for anticoagulation. Follow up with cardiology as scheduled.

## 2021-09-19 NOTE — Assessment & Plan Note (Signed)
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 HRCT from June stable.Previous walking oximetry without desaturations. PFTs from April stable.

## 2021-09-19 NOTE — Progress Notes (Signed)
$'@Patient'J$  ID: [name], male    DOB: [birth year], 76 y.o.   MRN: [id number]  Chief Complaint  Patient presents with   Follow-up    Questions about new cpap machine    Referring provider: [name], MD  HPI: 76 year old male, former smoker (60-pack-year history) followed for OSA, emphysema and interstitial pulmonary disease (probable UIP) post-COVID.  He is followed by Dr. [name] for ILD and Dr. Ander Slade for OSA.  Last seen in office by Saralee Bolick NP on 05/31/2021.  Past medical history significant for hypertension, ischemic cardiomyopathy, pericarditis, A-fib on Xarelto, chronic systolic CHF, HLD, CLL, history of CABG x5.  He is followed by Dr. [name] for monitoring of CLL.  TEST/EVENTS:  05/05/2019 CPAP titration: AHI 22.8/h, SPO2 low 85%.  Optimal setting 14 cmH2O.  Moderate obstructive sleep apnea. 10/19/2019 PFTs: FVC 4.01 (91), FEV1 3.08 (96), ratio 77, TLC 84%, DLCO uncorrected 55%.  No BD.  Overall normal spirometry with moderately severe diffusion defect. 12/06/2020 HRCT: There are coronary artery calcifications status post CABG.  Atherosclerosis is present.  Numerous nonenlarged mediastinal nodes, unchanged.  Mild bilateral air trapping.  Centrilobular and paraseptal emphysema.  Subpleural predominant reticular and groundglass opacities within mid and lower lung predominance.  There is mild traction bronchiolectasis.  Stable solid pulmonary nodule right lower lobe 6 mm, likely fibrosis.  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 05/07/2021 PFTs: FVC 91, FEV1 94, ratio 75, TLC 80, DLCO corrected for alveolar volume 55%.  No significant BD; did have some mid flow reversibility.  Overall normal spirometry with stable diffusion defect 05/16/2021 CPAP titration: AHI 12.7 on CPAP; transition to BiPAP therapy.  Appropriate setting of 22/18 cmH2O with residual AHI 0.8 06/27/2021 HRCT chest: Atherosclerosis.  Status post median sternotomy with CABG including lima to left circumflex territory.   Mild dilatation of the pulmonic trunk.  No LAD.  Widespread areas of groundglass attenuation, septal thickening, subpleural reticulation and peripheral bronchiolectasis are noted throughout the lungs bilaterally.  Findings have mild craniocaudal gradient.  No definite progression compared to the prior study.  There is mild centrilobular and paraseptal emphysema.  No evidence of honeycombing.  Probable UIP.  03/09/2021: OV with Cendy Oconnor NP.  Reported some issues with CPAP machine.  Wife reported that it is become very noisy at night.  Unable to obtain formal download.  Patient's download showed significant mask leaks, despite the new mask he has, and high AHI's ranging from 10 to 50/h.  Averages about 5 hours and 48 minutes a night, which is usually about how long he sleeps for.  Recently started melatonin which has helped lengthen his sleep.  Feels as though he sleeps well at night otherwise.  Currently using a nasal mask.  Change CPAP settings to 10-20 and ordered mask desensitization study.  Reported progressive DOE over the past few months and occasional cough with clear sputum.  Also noted intermittent swelling of both his lower extremities.  CXR without acute process; chronic, slightly improved ILD was present.  Recent HRCT showed stable fibrotic changes.  Will order PFTs for further evaluation.  BNP was elevated.  DOE primarily especially noted to be related to CHF with EF 35%.  05/07/2021: OV with Ezel Vallone NP for follow-up after pulmonary function testing.  PFTs were overall stable when compared to a year ago.  Did have some mid flow reversibility which was not identified on previous testing.  Suspected that diffusion defect was multifactorial including systolic CHF.  At [employer] patient reported feeling relatively the same as  he did previous visit.  He was planned to have a cardioversion with cardiology for A-fib but converted to normal sinus rhythm prior to the procedure.  Has felt somewhat better since then.  Still  with a minimally productive cough.  Leg swelling has improved.  Continues to wear CPAP nightly.  Did feel like mask fit better.  Unfortunately download still showed residual AHI of 11.9/h.  CPAP titration study ordered with plans to transition to BiPAP if not well controlled on CPAP.  Advised to continue on Anoro, Claritin and Flonase.  As needed albuterol.  IPF and emphysema appeared overall stable with recent walking oximetry without desaturations.  Last HRCT was from November.  Suspected some of his progressive DOE was likely related to CHF and ongoing cardiac problems.  05/31/2021: OV with Illeana Edick NP for follow-up after CPAP titration study which showed that his sleep apnea was not controlled on CPAP therapy so he was transitioned to BiPAP therapy. Since he was seen last, reports DOE and cough overall the same.  He has had progressive decline in activity tolerance over the last 6 months to a year, which we obtain PFTs for at his last visit that overall looked stable.  He recently wore a heart monitor due to bradycardia and hx of a fib; has yet to get the results.  Lower extremity swelling has mostly resolved.  He denies any hemoptysis, recent weight loss, anorexia, wheezing.  Feels like he sleeps relatively well at night.  Denies morning headaches or drowsy driving.  He continues on Anoro.  Rarely uses albuterol.  09/19/2021: Today - follow up Patient presents today with his wife for follow up. He has been doing well since I saw him last. He actually feels like his breathing has improved. No increased cough or chest congestion. Heart rate has improved; he is still in an irregular rhythm. Plan is to continue with rate control therapy for time being. He has started on BiPAP therapy. He is getting better sleep at night; he states that he has been sleeping about 1.5 hours longer every night. Wakes feeling like he slept better. He is having issues with his mask leaking; his wife notices a whistling noise. He also has  noticed that his humidification chamber is dry in the morning. He has talked to the DME company but hasn't gotten much help on fixing the issue. He is wearing a small/medium full face mask, which is what he was tried on during his titration. Denies any drowsy driving.   Allergies  Allergen Reactions   Penicillins Other (See Comments)    Put pt in hospital 50 yrs ago   Ace Inhibitors Cough   Darvocet [Propoxyphene N-Acetaminophen] Rash    Tolerates tylenol   Sulfa Antibiotics Rash    Immunization History  Administered Date(s) Administered   Fluad Quad(high Dose 65+) 10/23/2020   Influenza Split 11/23/2015   Influenza, High Dose Seasonal PF 10/27/2013, 11/03/2014, 11/23/2015, 10/26/2016, 09/28/2018   Influenza,inj,Quad PF,6+ Mos 11/07/2010, 10/22/2012, 10/23/2020   Influenza-Unspecified 11/21/2013, 11/04/2016   PFIZER(Purple Top)SARS-COV-2 Vaccination 04/02/2019, 04/22/2019, 09/21/2019   Pneumococcal Conjugate-13 02/23/2016   Pneumococcal Polysaccharide-23 11/18/2011   Tdap 03/05/2011    Past Medical History:  Diagnosis Date   Atrial tachycardia ([place])    a. s/p DCCV 09/2009; b. s/p RFCA 03/2011; c. s/p DCCV 04/2011; d. s/p RFCA 09/17/11.   Cataract    Chronic systolic CHF (congestive heart failure) ([place])    a. 01/2015 Echo: EF 40-45%, antsept/infsept HK, triv AI, mild MR, mod  dil LA, nl RV, mildly dil RA.   CLL (chronic lymphoblastic leukemia)    Stage 0-1   Coronary artery disease involving native coronary artery without angina pectoris    a. Status post CABG in 1994:By Dr. Cyndia Bent. LIMA - L Cx, seqSVG-Diag-LAD, and SVG-PDA-PL.   Cough    Consistant with ACE inhibitor mediated cough   Dysrhythmia    Glaucoma (increased eye pressure) 1991   History of tobacco abuse    quit 1994   Hypercholesterolemia    Well Controlled   Hypertension    Ischemic cardiomyopathy    a. 02/2011 Echo: EF 40-45%;  b. 01/2015 Echo: EF 40-45%.   Malaria 1972   Hx of   Pericarditis    a.  01/2015-->Treated w/ ibuprofen/colchicine.   Sleep-disordered breathing 06/20/2011    Tobacco History: Social History   Tobacco Use  Smoking Status Former   Packs/day: 2.00   Years: 30.00   Total pack years: 60.00   Types: Cigarettes   Quit date: 01/22/1992   Years since quitting: 29.6  Smokeless Tobacco Never   Counseling given: Not Answered   Outpatient Medications Prior to Visit  Medication Sig Dispense Refill   ANORO ELLIPTA 62.5-25 MCG/ACT AEPB USE 1 INHALATION DAILY 360 each 3   docusate sodium (COLACE) 100 MG capsule Take 100 mg by mouth 2 (two) times daily.     ENTRESTO 49-51 MG TAKE 1 TABLET TWICE A DAY 180 tablet 3   ezetimibe-simvastatin (VYTORIN) 10-20 MG tablet TAKE 1 TABLET AT BEDTIME (KEEP UPCOMING APPOINTMENT FOR REFILLS) 90 tablet 3   Fiber, Guar Gum, CHEW Chew 3 each by mouth daily.     fluticasone (FLONASE) 50 MCG/ACT nasal spray Place 2 sprays into both nostrils daily. 18.2 mL 3   furosemide (LASIX) 20 MG tablet Take 1 tablet (20 mg total) by mouth daily. 90 tablet 3   Iron, Ferrous Sulfate, 325 (65 Fe) MG TABS Take 1 tablet by mouth daily.     latanoprost (XALATAN) 0.005 % ophthalmic solution Place 1 drop into both eyes at bedtime.     loratadine (CLARITIN) 10 MG tablet Take 1 tablet (10 mg total) by mouth daily. 90 tablet 3   Melatonin 5 MG CHEW Chew 5 mg by mouth daily.     metoprolol succinate (TOPROL-XL) 50 MG 24 hr tablet TAKE 1 TABLET IN THE MORNING AND ONE-HALF (1/2) TABLET IN THE EVENING (Patient taking differently: Take 50 mg by mouth daily.) 135 tablet 3   pramipexole (MIRAPEX) 1.5 MG tablet Take 1.5 mg by mouth at bedtime.     RABEprazole (ACIPHEX) 20 MG tablet Take 1 tablet (20 mg total) by mouth daily. 90 tablet 3   spironolactone (ALDACTONE) 25 MG tablet TAKE 1 TABLET DAILY 45 tablet 7   XARELTO 20 MG TABS tablet TAKE 1 TABLET DAILY WITH SUPPER 90 tablet 3   Fluticasone-Umeclidin-Vilant (TRELEGY ELLIPTA) 100-62.5-25 MCG/ACT AEPB Inhale 1 puff into  the lungs daily. (Patient not taking: Reported on 09/19/2021) 1 each 0   No facility-administered medications prior to visit.     Review of Systems:   Constitutional: No weight loss or gain, night sweats, fevers, chills. +daytime fatigue (improving) HEENT: No headaches, difficulty swallowing, tooth/dental problems, or sore throat. No sneezing, itching, ear ache, nasal congestion, or post nasal drip CV:  +occasional BLE edema (mostly in evenings). No chest pain, orthopnea, PND, anasarca, dizziness, palpitations, syncope Resp: +shortness of breath with exertion (improved); occasionally minimally productive cough (clear). No excess mucus or change in  color of mucus. No hemoptysis. No wheezing.  No chest wall deformity GI:  No heartburn, indigestion, abdominal pain, nausea, vomiting, diarrhea, change in bowel habits, loss of appetite, bloody stools.  Skin: No rash, lesions, ulcerations MSK:  No joint pain or swelling.  No decreased range of motion.  No back pain. Neuro: No dizziness or lightheadedness.  Psych: No depression or anxiety. Mood stable.     Physical Exam:  BP 104/60 (BP Location: Left Arm, Cuff Size: Normal)   Pulse 69   Ht '5\' 10"'$  (1.778 m)   Wt 212 lb 9.6 oz (96.4 kg)   SpO2 94%   BMI 30.50 kg/m   GEN: Pleasant, interactive, well-appearing; obese; in no acute distress HEENT:  Normocephalic and atraumatic. PERRLA. Sclera white. Nasal turbinates pink, moist and patent bilaterally. No rhinorrhea present. Oropharynx pink and moist, without exudate or edema. No lesions, ulcerations, or postnasal drip.  NECK:  Supple w/ fair ROM. No JVD present. Normal carotid impulses w/o bruits. Thyroid symmetrical with no goiter or nodules palpated. No lymphadenopathy.   CV: RRR, no m/r/g, no peripheral edema. Pulses intact, +2 bilaterally. No cyanosis, pallor or clubbing. PULMONARY:  Unlabored, regular breathing. Clear bilaterally A&P w/o wheezes/rales/rhonchi. No accessory muscle use. No  dullness to percussion. GI: BS present and normoactive. Soft, non-tender to palpation. No organomegaly or masses detected. No CVA tenderness. MSK: No erythema, warmth or tenderness. Cap refil <2 sec all extrem. No deformities or joint swelling noted.  Neuro: A/Ox3. No focal deficits noted.   Skin: Warm, no lesions or rashe Psych: Normal affect and behavior. Judgement and thought content appropriate.     Lab Results:  CBC    Component Value Date/Time   WBC 17.1 (H) 03/22/2021 0855   WBC 30.2 (H) 12/30/2018 0040   RBC 5.20 03/22/2021 0855   HGB 13.9 03/22/2021 0855   HGB 13.9 03/20/2021 1015   HGB 13.8 08/22/2016 0932   HCT 44.2 03/22/2021 0855   HCT 43.2 03/20/2021 1015   HCT 44.1 08/22/2016 0932   PLT 122 (L) 03/22/2021 0855   PLT 127 (L) 03/20/2021 1015   MCV 85.0 03/22/2021 0855   MCV 84 03/20/2021 1015   MCV 86.0 08/22/2016 0932   MCH 26.7 03/22/2021 0855   MCHC 31.4 03/22/2021 0855   RDW 14.6 03/22/2021 0855   RDW 13.4 03/20/2021 1015   RDW 14.3 08/22/2016 0932   LYMPHSABS 11.7 (H) 03/22/2021 0855   LYMPHSABS 11.4 (H) 03/20/2021 1015   LYMPHSABS 16.6 (H) 08/22/2016 0932   MONOABS 0.8 03/22/2021 0855   MONOABS 0.3 08/22/2016 0932   EOSABS 0.1 03/22/2021 0855   EOSABS 0.1 03/20/2021 1015   BASOSABS 0.0 03/22/2021 0855   BASOSABS 0.0 03/20/2021 1015   BASOSABS 0.0 08/22/2016 0932    BMET    Component Value Date/Time   NA 140 03/22/2021 0855   NA 141 03/20/2021 1015   NA 141 08/22/2016 0932   K 4.7 03/22/2021 0855   K 4.5 08/22/2016 0932   CL 106 03/22/2021 0855   CL 105 01/02/2012 0938   CO2 29 03/22/2021 0855   CO2 28 08/22/2016 0932   GLUCOSE 105 (H) 03/22/2021 0855   GLUCOSE 98 08/22/2016 0932   GLUCOSE 97 01/02/2012 0938   BUN 21 03/22/2021 0855   BUN 20 03/20/2021 1015   BUN 16.6 08/22/2016 0932   CREATININE 1.05 03/22/2021 0855   CREATININE 0.9 08/22/2016 0932   CALCIUM 10.0 03/22/2021 0855   CALCIUM 9.5 08/22/2016 0932   GFRNONAA >  60  03/22/2021 0855   GFRAA >60 08/27/2019 0846    BNP    Component Value Date/Time   BNP 304.0 (H) 03/20/2021 1015   BNP 208.8 (H) 12/29/2018 0450     Imaging:  No results found.       Latest Ref Rng & Units 05/07/2021    9:40 AM 10/19/2019    8:47 AM 03/29/2019    1:49 PM  PFT Results  FVC-Pre L 3.82  3.98  3.31   FVC-Predicted Pre % 89  90  74   FVC-Post L 3.91  4.01  3.32   FVC-Predicted Post % 91  91  74   Pre FEV1/FVC % % 73  77  76   Post FEV1/FCV % % 75  77  78   FEV1-Pre L 2.77  3.07  2.52   FEV1-Predicted Pre % 89  96  77   FEV1-Post L 2.92  3.08  2.59   DLCO uncorrected ml/min/mmHg 13.80  14.26  14.25   DLCO UNC% % 54  55  54   DLCO corrected ml/min/mmHg 14.08  14.26  14.21   DLCO COR %Predicted % 55  55  54   DLVA Predicted % 61  62  69   TLC L 5.74  6.12  5.78   TLC % Predicted % 80  84  79   RV % Predicted % 88  84  85     No results found for: "NITRICOXIDE"      Assessment & Plan:   OSA (obstructive sleep apnea) Underwent CPAP titration in 04/2021 with poor control of OSA on CPAP so he was transitioned to BiPAP. He has started on this. Breakthrough events are better controlled, especially over the past few weeks, but he is having significant mask leaks. We will adjust his BiPAP settings to auto set IPAP max 22, EPAP min 12, and PS 4. If he continues to have problems with leaks, we will send him for mask fitting. Cautioned to be aware of drowsy driving.  Patient Instructions  Continue BiPAP nightly. Order sent to DME to adjust pressure to BiPAP auto IPAP max 22, EPAP minimum 12, PS 4 Wear for a minimum of 4-6 hours a night   -Finish up Anoro and then start Trelegy 1 puff daily. Brush tongue and rinse mouth afterwards.  -Continue Albuterol inhaler 2 puffs every 6 hours as needed for shortness of breath or wheezing. Notify if symptoms persist despite rescue inhaler/neb use. -Continue Claritin 10 mg daily -Continue Flonase 2 sprays each nostril daily   -Continue Mucinex 600 mg Twice daily for chest congestion and nasal drainage   Follow up in 4 weeks with Katie Inez Stantz,NP to recheck BiPAP download and 12 weeks with Dr. [name]. If symptoms do not improve or worsen, please contact office for sooner follow up or seek emergency care.    Interstitial pulmonary disease (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 HRCT from June stable. Previous walking oximetry without desaturations. PFTs from April stable.   Centrilobular emphysema ([place]) At previous OV, discussed trial change to Trelegy from [facility] given his midflow reversibility and worsening of DOE. He was provided with sample. He does feel like he benefited from the change. He received multiple Anoro inhalers from [employer] so he would like to finish these up before making the official change. Advised that if his breathing worsens or he flares back up again, I'd like for him to restart Trelegy as maintenance.  Chronic systolic CHF (congestive heart failure) ([place]) Appears compensated on exam today. Follow up with cardiology as scheduled.   Atrial fibrillation ([facility]) Irregular rhythm, rate controlled. Continue metoprolol for rate control and Xarelto for anticoagulation. Follow up with cardiology as scheduled.     I spent 32 minutes of dedicated to the care of this patient on the date of this encounter to include pre-visit review of records, face-to-face time with the patient discussing conditions above, post visit ordering of testing, clinical documentation with the electronic health record, making appropriate referrals as documented, and communicating necessary findings to members of the patients care team.  Clayton Bibles, NP 09/19/2021  Pt aware and understands NP's role.

## 2021-09-19 NOTE — Assessment & Plan Note (Signed)
Underwent CPAP titration in 04/2021 with poor control of OSA on CPAP so he was transitioned to BiPAP. He has started on this. Breakthrough events are better controlled, especially over the past few weeks, but he is having significant mask leaks. We will adjust his BiPAP settings to auto set IPAP max 22, EPAP min 12, and PS 4. If he continues to have problems with leaks, we will send him for mask fitting. Cautioned to be aware of drowsy driving.  Patient Instructions  Continue BiPAP nightly. Order sent to DME to adjust pressure to BiPAP auto IPAP max 22, EPAP minimum 12, PS 4 Wear for a minimum of 4-6 hours a night   -Finish up Anoro and then start Trelegy 1 puff daily. Brush tongue and rinse mouth afterwards.  -Continue Albuterol inhaler 2 puffs every 6 hours as needed for shortness of breath or wheezing. Notify if symptoms persist despite rescue inhaler/neb use. -Continue Claritin 10 mg daily -Continue Flonase 2 sprays each nostril daily  -Continue Mucinex 600 mg Twice daily for chest congestion and nasal drainage   Follow up in 4 weeks with Katie Vernette Moise,NP to recheck BiPAP download and 12 weeks with Dr. [name]. If symptoms do not improve or worsen, please contact office for sooner follow up or seek emergency care.

## 2021-09-19 NOTE — Patient Instructions (Addendum)
Continue BiPAP nightly. Order sent to DME to adjust pressure to BiPAP auto IPAP max 22, EPAP minimum 12, PS 4 Wear for a minimum of 4-6 hours a night   -Finish up Anoro and then start Trelegy 1 puff daily. Brush tongue and rinse mouth afterwards.  -Continue Albuterol inhaler 2 puffs every 6 hours as needed for shortness of breath or wheezing. Notify if symptoms persist despite rescue inhaler/neb use. -Continue Claritin 10 mg daily -Continue Flonase 2 sprays each nostril daily  -Continue Mucinex 600 mg Twice daily for chest congestion and nasal drainage   Follow up in 4 weeks with Katie Konstantina Nachreiner,NP to recheck BiPAP download and 12 weeks with Dr. [name]. If symptoms do not improve or worsen, please contact office for sooner follow up or seek emergency care.

## 2021-09-19 NOTE — Assessment & Plan Note (Signed)
At previous OV, discussed trial change to Trelegy from [facility] given his midflow reversibility and worsening of DOE. He was provided with sample. He does feel like he benefited from the change. He received multiple Anoro inhalers from [employer] so he would like to finish these up before making the official change. Advised that if his breathing worsens or he flares back up again, I'd like for him to restart Trelegy as maintenance.

## 2021-09-19 NOTE — Progress Notes (Signed)
Virtual Visit via Telephone Note   Because of Rodney Burns's co-morbid illnesses, he is at least at moderate risk for complications without adequate follow up.  This format is felt to be most appropriate for this patient at this time.  The patient did not have access to video technology/had technical difficulties with video requiring transitioning to audio format only (telephone).  All issues noted in this document were discussed and addressed.  No physical exam could be performed with this format.  Please refer to the patient's chart for his consent to telehealth for [facility].  Evaluation Performed:  Preoperative cardiovascular risk assessment _____________   Date:  09/19/2021   Patient ID:  Rodney Burns, DOB May 21, 1945, MRN [id number] Patient Location:  Home Provider location:   Office  Primary Care Provider:  [name], MD (Inactive) Primary Cardiologist:  [name], MD  Chief Complaint / Patient Profile   76 y.o. y/o male with a h/o  Coronary artery disease  S/p CABG in 1994 Myoview 2016 no ischemia (HFrEF) heart failure with reduced ejection fraction  Echocardiogram 01/2018: EF 40-45 Echocardiogram 10/2018: EF 40-45 Echocardiogram 03/2019: EF 35-40 Echocardiogram 03/2021: EF 35, global HK, mild MR, trivial AI, mildly dilated aortic root (40 mm), trivial PI Atrial flutter/fibrillation  Loletha Grayer >> beta-blocker reduced  Rate ctl strategy (only AAD option is dofetilide vs PVI) Atrial tachycardia S/p RFA 2011 S/p Repeat RFA in 2013 Mild MR (echo 03/2021) Aortic root dilation (40 mm 03/2021 echocardiogram) Hypertension  Hyperlipidemia   OSA Thoracic aortic aneurysm  Hx of pericarditis 01/2018 Chronic Lymphocytic Leukemia   COVID-19 12/2018>>hypoxic resp failure  Pulmonary fibrosis on chronic O2 Orthostatic hypotension    who is pending  Procedure:   COLONOSCOPY : CHANGE IN BOWEL HABITS. H/O COLON POLYPS, F/HX COLON CANCER Date of Surgery:  Clearance  09/26/21                              Surgeon:  DR/ Paulita Fujita Surgeon's Group or Practice Name:  [facility] Type of Clearance Requested:   - Medical  - Pharmacy:  Hold Rivaroxaban (Xarelto) (STOP DATE 10/13/21; RESUME 09/27/21 Type of Anesthesia:  Not Indicated (PROPOFOL?)  He presents today for telephonic preoperative cardiovascular risk assessment.  Past Medical History    Past Medical History:  Diagnosis Date   Atrial tachycardia ([place])    a. s/p DCCV 09/2009; b. s/p RFCA 03/2011; c. s/p DCCV 04/2011; d. s/p RFCA 09/17/11.   Cataract    Chronic systolic CHF (congestive heart failure) ([place])    a. 01/2015 Echo: EF 40-45%, antsept/infsept HK, triv AI, mild MR, mod dil LA, nl RV, mildly dil RA.   CLL (chronic lymphoblastic leukemia)    Stage 0-1   Coronary artery disease involving native coronary artery without angina pectoris    a. Status post CABG in 1994:By Dr. Cyndia Bent. LIMA - L Cx, seqSVG-Diag-LAD, and SVG-PDA-PL.   Cough    Consistant with ACE inhibitor mediated cough   Dysrhythmia    Glaucoma (increased eye pressure) 1991   History of tobacco abuse    quit 1994   Hypercholesterolemia    Well Controlled   Hypertension    Ischemic cardiomyopathy    a. 02/2011 Echo: EF 40-45%;  b. 01/2015 Echo: EF 40-45%.   Malaria 1972   Hx of   Pericarditis    a. 01/2015-->Treated w/ ibuprofen/colchicine.   Sleep-disordered breathing 06/20/2011   Past Surgical History:  Procedure Laterality Date   ATRIAL FLUTTER ABLATION N/A 04/11/2011   Procedure: ATRIAL FLUTTER ABLATION;  Surgeon: Deboraha Sprang, MD;  Location: [facility] CATH LAB;  Service: Cardiovascular;  Laterality: N/A;   ATRIAL FLUTTER ABLATION N/A 09/17/2011   Procedure: ATRIAL FLUTTER ABLATION;  Surgeon: Thompson Grayer, MD;  Location: [facility] CATH LAB;  Service: Cardiovascular;  Laterality: N/A;   CARDIOVERSION  06/03/2011   Procedure: CARDIOVERSION;  Surgeon: Deboraha Sprang, MD;  Location: [place];  Service: Cardiovascular;  Laterality: N/A;   CATARACT  EXTRACTION     [place]   By Dr. Cyndia Bent. LIMA - L Cx, seqSVG-Diag-LAD, and SVG-PDA-PL   ELECTROPHYSIOLOGY STUDY N/A 04/11/2011   Procedure: ELECTROPHYSIOLOGY STUDY;  Surgeon: Deboraha Sprang, MD;  Location: [facility] CATH LAB;  Service: Cardiovascular;  Laterality: N/A;   EP study and ablation  2013   by Dr Caryl Comes, repeat ablation by Dr Rayann Heman   NM MYOVIEW LTD  03/2014   scar in the inferior and anteroapical regions without ischemia. This is similar to finding on Myoview in 2013. EF is a little lower 39%>>32%.   TRIGGER FINGER RELEASE  12/2016   left hand    US ECHOCARDIOGRAPHY  09-07-09; 02/2011   a. Est EF 40-45%; b. EF 40-45%. Gr 2 DD. Mild LA dil    Allergies  Allergies  Allergen Reactions   Penicillins Other (See Comments)    Put pt in hospital 50 yrs ago   Ace Inhibitors Cough   Darvocet [Propoxyphene N-Acetaminophen] Rash    Tolerates tylenol   Sulfa Antibiotics Rash    History of Present Illness    Rodney Burns is a 76 y.o. male who presents via audio/video conferencing for a telehealth visit today.  Pt was last seen in cardiology clinic on 06/27/2021 by Dr. Martinique.  At that time Rodney Burns was doing well.  The patient is now pending procedure as outlined above. Since his last visit, he has done well without chest pain, syncope, orthopnea, leg edema.  He has chronic shortness of breath which is unchanged.  During our phone call, he was in the middle of working in his flower bed.   Home Medications    Prior to Admission medications   Medication Sig Start Date End Date Taking? Authorizing Provider  ANORO ELLIPTA 62.5-25 MCG/ACT AEPB USE 1 INHALATION DAILY 11/27/20   Sherrilyn Rist A, MD  docusate sodium (COLACE) 100 MG capsule Take 100 mg by mouth 2 (two) times daily.    [provider]  ENTRESTO 49-51 MG TAKE 1 TABLET TWICE A DAY 04/23/21   Martinique, [name], MD  ezetimibe-simvastatin (VYTORIN) 10-20 MG tablet TAKE 1 TABLET AT BEDTIME (KEEP UPCOMING  APPOINTMENT FOR REFILLS) 02/14/21   Martinique, [name], MD  Fiber, Guar Gum, CHEW Chew 3 each by mouth daily.    [provider]  fluticasone (FLONASE) 50 MCG/ACT nasal spray Place 2 sprays into both nostrils daily. 03/09/21   Cobb, Karie Schwalbe, NP  Fluticasone-Umeclidin-Vilant (TRELEGY ELLIPTA) 100-62.5-25 MCG/ACT AEPB Inhale 1 puff into the lungs daily. 05/31/21   Cobb, Karie Schwalbe, NP  furosemide (LASIX) 20 MG tablet Take 1 tablet (20 mg total) by mouth daily. 03/20/21 03/15/22  Martinique, [name], MD  Iron, Ferrous Sulfate, 325 (65 Fe) MG TABS Take 1 tablet by mouth daily.    [provider]  latanoprost (XALATAN) 0.005 % ophthalmic solution Place 1 drop into both eyes at bedtime.    [provider]  loratadine (CLARITIN) 10 MG tablet Take 1 tablet (10 mg total) by mouth daily. 04/09/21   Cobb, Karie Schwalbe, NP  Melatonin 5 MG CHEW Chew 5 mg by mouth daily.    [provider]  metoprolol succinate (TOPROL-XL) 50 MG 24 hr tablet TAKE 1 TABLET IN THE MORNING AND ONE-HALF (1/2) TABLET IN THE EVENING Patient taking differently: Take 50 mg by mouth daily. 02/21/21   Martinique, [name], MD  pramipexole (MIRAPEX) 1.5 MG tablet Take 1.5 mg by mouth at bedtime. 04/23/19   [provider]  RABEprazole (ACIPHEX) 20 MG tablet Take 1 tablet (20 mg total) by mouth daily. 01/28/12   Martinique, [name], MD  spironolactone (ALDACTONE) 25 MG tablet TAKE 1 TABLET DAILY 05/16/21   Martinique, [name], MD  XARELTO 20 MG TABS tablet TAKE 1 TABLET DAILY WITH SUPPER 04/23/21   Almyra Deforest, [place]    Physical Exam    Vital Signs:  Rodney Burns does not have vital signs available for review today.  Given telephonic nature of communication, physical exam is limited. AAOx3. NAD. Normal affect.  Speech and respirations are unlabored.  Accessory Clinical Findings    None  Assessment & Plan    1.  Preoperative Cardiovascular Risk Assessment:    Rodney Burns perioperative risk of a major cardiac event is  6.6% according to the Revised Cardiac Risk Index (RCRI).  Therefore, he is at high risk for perioperative complications.   His functional capacity is good at 4.31 METs according to the Duke Activity Status Index (DASI). Recommendations: According to ACC/AHA guidelines, no further cardiovascular testing needed.  The patient may proceed to surgery at acceptable risk.   Antiplatelet and/or Anticoagulation Recommendations: Xarelto (Rivaroxaban) can be held for 2 days prior to surgery.  Please resume post op when felt to be safe.    The patient will NOT need bridging with Lovenox (enoxaparin) around procedure.   A copy of this note will be routed to requesting surgeon.  Time:   Today, I have spent 8 minutes with the patient with telehealth technology discussing medical history, symptoms, and management plan.     Rodney Dopp, PA-C  09/19/2021, 10:52 AM

## 2021-09-25 NOTE — Anesthesia Preprocedure Evaluation (Signed)
Anesthesia Evaluation  Patient identified by MRN, date of birth, ID band Patient awake    Reviewed: Allergy & Precautions, NPO status , Patient's Chart, lab work & pertinent test results, reviewed documented beta blocker date and time   History of Anesthesia Complications Negative for: history of anesthetic complications  Airway Mallampati: II  TM Distance: >3 FB Neck ROM: Full    Dental no notable dental hx.    Pulmonary sleep apnea , COPD,  COPD inhaler, former smoker,    Pulmonary exam normal        Cardiovascular hypertension, Pt. on home beta blockers and Pt. on medications + CAD, + CABG (1994) and +CHF  Normal cardiovascular exam+ dysrhythmias Atrial Fibrillation   TTE 03/22/21: EF 35%, global hypokinesis, RV mildly enlarged, mild RAE/LAE, mild MR, mild dilatation of the aortic root measuring 73m   Neuro/Psych negative neurological ROS  negative psych ROS   GI/Hepatic Neg liver ROS, GERD  Medicated and Controlled,  Endo/Other  negative endocrine ROS  Renal/GU negative Renal ROS  negative genitourinary   Musculoskeletal negative musculoskeletal ROS (+)   Abdominal   Peds  Hematology negative hematology ROS (+)   Anesthesia Other Findings Day of surgery medications reviewed with patient.  Reproductive/Obstetrics negative OB ROS                            Anesthesia Physical Anesthesia Plan  ASA: 4  Anesthesia Plan: MAC   Post-op Pain Management: Minimal or no pain anticipated   Induction:   PONV Risk Score and Plan: 1 and Treatment may vary due to age or medical condition and Propofol infusion  Airway Management Planned: Natural Airway and Nasal Cannula  Additional Equipment: None  Intra-op Plan:   Post-operative Plan:   Informed Consent: I have reviewed the patients History and Physical, chart, labs and discussed the procedure including the risks, benefits and  alternatives for the proposed anesthesia with the patient or authorized representative who has indicated his/her understanding and acceptance.       Plan Discussed with: CRNA  Anesthesia Plan Comments:        Anesthesia Quick Evaluation

## 2021-09-26 ENCOUNTER — Encounter (HOSPITAL_COMMUNITY): Payer: Self-pay | Admitting: Gastroenterology

## 2021-09-26 ENCOUNTER — Ambulatory Visit (HOSPITAL_BASED_OUTPATIENT_CLINIC_OR_DEPARTMENT_OTHER): Payer: Medicare Other | Admitting: Anesthesiology

## 2021-09-26 ENCOUNTER — Ambulatory Visit (HOSPITAL_COMMUNITY)
Admission: RE | Admit: 2021-09-26 | Discharge: 2021-09-26 | Disposition: A | Discharge status: Home / Self Care | Payer: Medicare Other | Attending: Gastroenterology | Admitting: Gastroenterology

## 2021-09-26 ENCOUNTER — Ambulatory Visit (HOSPITAL_COMMUNITY): Payer: Medicare Other | Admitting: Anesthesiology

## 2021-09-26 ENCOUNTER — Encounter (HOSPITAL_COMMUNITY)
Admission: RE | Disposition: A | Discharge status: Home / Self Care | Payer: Self-pay | Source: Home / Self Care | Attending: Gastroenterology

## 2021-09-26 ENCOUNTER — Other Ambulatory Visit: Payer: Self-pay

## 2021-09-26 DIAGNOSIS — K5730 Diverticulosis of large intestine without perforation or abscess without bleeding: Secondary | ICD-10-CM | POA: Diagnosis not present

## 2021-09-26 DIAGNOSIS — G4730 Sleep apnea, unspecified: Secondary | ICD-10-CM | POA: Diagnosis not present

## 2021-09-26 DIAGNOSIS — J449 Chronic obstructive pulmonary disease, unspecified: Secondary | ICD-10-CM

## 2021-09-26 DIAGNOSIS — Z7901 Long term (current) use of anticoagulants: Secondary | ICD-10-CM | POA: Insufficient documentation

## 2021-09-26 DIAGNOSIS — I4891 Unspecified atrial fibrillation: Secondary | ICD-10-CM | POA: Diagnosis not present

## 2021-09-26 DIAGNOSIS — K219 Gastro-esophageal reflux disease without esophagitis: Secondary | ICD-10-CM | POA: Insufficient documentation

## 2021-09-26 DIAGNOSIS — I2510 Atherosclerotic heart disease of native coronary artery without angina pectoris: Secondary | ICD-10-CM | POA: Insufficient documentation

## 2021-09-26 DIAGNOSIS — Z800 Family history of malignant neoplasm of digestive organs: Secondary | ICD-10-CM | POA: Diagnosis not present

## 2021-09-26 DIAGNOSIS — Z86010 Personal history of colonic polyps: Secondary | ICD-10-CM | POA: Diagnosis not present

## 2021-09-26 DIAGNOSIS — Z87891 Personal history of nicotine dependence: Secondary | ICD-10-CM | POA: Diagnosis not present

## 2021-09-26 DIAGNOSIS — K649 Unspecified hemorrhoids: Secondary | ICD-10-CM | POA: Diagnosis not present

## 2021-09-26 DIAGNOSIS — I5022 Chronic systolic (congestive) heart failure: Secondary | ICD-10-CM | POA: Insufficient documentation

## 2021-09-26 DIAGNOSIS — I509 Heart failure, unspecified: Secondary | ICD-10-CM | POA: Diagnosis not present

## 2021-09-26 DIAGNOSIS — R194 Change in bowel habit: Secondary | ICD-10-CM | POA: Insufficient documentation

## 2021-09-26 DIAGNOSIS — Z951 Presence of aortocoronary bypass graft: Secondary | ICD-10-CM | POA: Diagnosis not present

## 2021-09-26 DIAGNOSIS — K648 Other hemorrhoids: Secondary | ICD-10-CM | POA: Diagnosis not present

## 2021-09-26 DIAGNOSIS — I110 Hypertensive heart disease with heart failure: Secondary | ICD-10-CM | POA: Insufficient documentation

## 2021-09-26 HISTORY — PX: COLONOSCOPY WITH PROPOFOL: SHX5780

## 2021-09-26 SURGERY — COLONOSCOPY WITH PROPOFOL
Anesthesia: Monitor Anesthesia Care

## 2021-09-26 MED ORDER — LACTATED RINGERS IV SOLN: INTRAVENOUS | Status: DC

## 2021-09-26 MED ORDER — PROPOFOL 10 MG/ML IV BOLUS
INTRAVENOUS | Status: DC | PRN
  Administered 2021-09-26: 20 mg via INTRAVENOUS

## 2021-09-26 MED ORDER — PROPOFOL 500 MG/50ML IV EMUL
INTRAVENOUS | Status: DC | PRN
  Administered 2021-09-26: 125 ug/kg/min via INTRAVENOUS

## 2021-09-26 MED ORDER — SODIUM CHLORIDE 0.9 % IV SOLN: INTRAVENOUS | Status: DC

## 2021-09-26 SURGICAL SUPPLY — 22 items

## 2021-09-26 NOTE — H&P (Signed)
Rodney Burns Gastroenterology H/P Note  Chief Complaint: change in bowel habits HPI: Rodney Burns is an 76 y.o. male.  Presents two-month history change in bowel habits, personal history colonic polyps, family history colon cancer (brother).  No abdominal pain, blood in stool, unintentional weight loss.  Past Medical History:  Diagnosis Date   Atrial tachycardia ([place])    a. s/p DCCV 09/2009; b. s/p RFCA 03/2011; c. s/p DCCV 04/2011; d. s/p RFCA 09/17/11.   Cataract    Chronic systolic CHF (congestive heart failure) ([place])    a. 01/2015 Echo: EF 40-45%, antsept/infsept HK, triv AI, mild MR, mod dil LA, nl RV, mildly dil RA.   CLL (chronic lymphoblastic leukemia)    Stage 0-1   Coronary artery disease involving native coronary artery without angina pectoris    a. Status post CABG in 1994:By Dr. Cyndia Bent. LIMA - L Cx, seqSVG-Diag-LAD, and SVG-PDA-PL.   Cough    Consistant with ACE inhibitor mediated cough   Dysrhythmia    Glaucoma (increased eye pressure) 1991   History of tobacco abuse    quit 1994   Hypercholesterolemia    Well Controlled   Hypertension    Ischemic cardiomyopathy    a. 02/2011 Echo: EF 40-45%;  b. 01/2015 Echo: EF 40-45%.   Malaria 1972   Hx of   Pericarditis    a. 01/2015-->Treated w/ ibuprofen/colchicine.   Sleep-disordered breathing 06/20/2011    Past Surgical History:  Procedure Laterality Date   ATRIAL FLUTTER ABLATION N/A 04/11/2011   Procedure: ATRIAL FLUTTER ABLATION;  Surgeon: Deboraha Sprang, MD;  Location: [facility] CATH LAB;  Service: Cardiovascular;  Laterality: N/A;   ATRIAL FLUTTER ABLATION N/A 09/17/2011   Procedure: ATRIAL FLUTTER ABLATION;  Surgeon: Thompson Grayer, MD;  Location: [facility] CATH LAB;  Service: Cardiovascular;  Laterality: N/A;   CARDIOVERSION  06/03/2011   Procedure: CARDIOVERSION;  Surgeon: Deboraha Sprang, MD;  Location: [place];  Service: Cardiovascular;  Laterality: N/A;   CATARACT EXTRACTION     [place]   By Dr. Cyndia Bent. LIMA - L Cx,  seqSVG-Diag-LAD, and SVG-PDA-PL   ELECTROPHYSIOLOGY STUDY N/A 04/11/2011   Procedure: ELECTROPHYSIOLOGY STUDY;  Surgeon: Deboraha Sprang, MD;  Location: [facility] CATH LAB;  Service: Cardiovascular;  Laterality: N/A;   EP study and ablation  2013   by Dr Caryl Comes, repeat ablation by Dr Rayann Heman   NM MYOVIEW LTD  03/2014   scar in the inferior and anteroapical regions without ischemia. This is similar to finding on Myoview in 2013. EF is a little lower 39%>>32%.   TRIGGER FINGER RELEASE  12/2016   left hand    US ECHOCARDIOGRAPHY  09-07-09; 02/2011   a. Est EF 40-45%; b. EF 40-45%. Gr 2 DD. Mild LA dil    Medications Prior to Admission  Medication Sig Dispense Refill   ANORO ELLIPTA 62.5-25 MCG/ACT AEPB USE 1 INHALATION DAILY (Patient taking differently: Inhale 1 puff into the lungs daily.) 360 each 3   docusate sodium (COLACE) 100 MG capsule Take 100 mg by mouth 2 (two) times daily.     ENTRESTO 49-51 MG TAKE 1 TABLET TWICE A DAY 180 tablet 3   ezetimibe-simvastatin (VYTORIN) 10-20 MG tablet TAKE 1 TABLET AT BEDTIME (KEEP UPCOMING APPOINTMENT FOR REFILLS) 90 tablet 3   Fiber, Guar Gum, CHEW Chew 3 each by mouth daily.     fluticasone (FLONASE) 50 MCG/ACT nasal spray Place 2 sprays into both nostrils daily. (Patient taking differently: Place 2 sprays into  both nostrils daily as needed for rhinitis.) 18.2 mL 3   furosemide (LASIX) 20 MG tablet Take 1 tablet (20 mg total) by mouth daily. 90 tablet 3   Iron, Ferrous Sulfate, 325 (65 Fe) MG TABS Take 325 mg by mouth daily.     latanoprost (XALATAN) 0.005 % ophthalmic solution Place 1 drop into both eyes at bedtime.     loratadine (CLARITIN) 10 MG tablet Take 1 tablet (10 mg total) by mouth daily. 90 tablet 3   Melatonin 5 MG CHEW Chew 5 mg by mouth at bedtime.     metoprolol succinate (TOPROL-XL) 50 MG 24 hr tablet TAKE 1 TABLET IN THE MORNING AND ONE-HALF (1/2) TABLET IN THE EVENING (Patient taking differently: Take 25 mg by mouth 2 (two) times daily.) 135  tablet 3   pramipexole (MIRAPEX) 1.5 MG tablet Take 1.5 mg by mouth at bedtime.     RABEprazole (ACIPHEX) 20 MG tablet Take 1 tablet (20 mg total) by mouth daily. 90 tablet 3   spironolactone (ALDACTONE) 25 MG tablet TAKE 1 TABLET DAILY 45 tablet 7   XARELTO 20 MG TABS tablet TAKE 1 TABLET DAILY WITH SUPPER 90 tablet 3   Fluticasone-Umeclidin-Vilant (TRELEGY ELLIPTA) 100-62.5-25 MCG/ACT AEPB Inhale 1 puff into the lungs daily. (Patient not taking: Reported on 09/19/2021) 1 each 0    Allergies:  Allergies  Allergen Reactions   Penicillins Other (See Comments)    Put pt in hospital 50 yrs ago   Ace Inhibitors Cough   Darvocet [Propoxyphene N-Acetaminophen] Rash    Tolerates tylenol   Sulfa Antibiotics Rash    Family History  Problem Relation Age of Onset   Heart failure Father    Cancer Brother        colon   Down syndrome Son     Social History:  reports that he quit smoking about 29 years ago. His smoking use included cigarettes. He has a 60.00 pack-year smoking history. He has never used smokeless tobacco. He reports that he does not drink alcohol and does not use drugs.   ROS: Positive for:As per HPI, all others negative   Blood pressure (!) 140/75, pulse 74, temperature 97.7 F (36.5 C), temperature source Temporal, resp. rate 17, height '5\' 10"'$  (1.778 m), weight 93.4 kg, SpO2 98 %. General appearance: NAD, alert, pleasant HEENT:  [place]/AT, anicteric, neck supple CV:  Irregularly irregular ABD:  Soft, non-tender NEURO:  A/O, non-focal, no lateralizing signs  No results found for this or any previous visit (from the past 48 hour(s)). No results found.  Assessment/Plan   Change in bowel habits. Personal history of colonic polyps. Family history of colon cancer (brother). Atrial fibrillation. Chronic anticoagulation, apixaban. Colonoscopy today. Risks (bleeding, infection, bowel perforation that could require surgery, sedation-related changes in cardiopulmonary  systems), benefits (identification and possible treatment of source of symptoms, exclusion of certain causes of symptoms), and alternatives (watchful waiting, radiographic imaging studies, empiric medical treatment) of colonoscopy were explained to patient/family in detail and patient wishes to proceed.   Reginald Weida M 09/26/2021, 10:00 AM

## 2021-09-26 NOTE — Anesthesia Postprocedure Evaluation (Signed)
Anesthesia Post Note  Patient: Rodney Burns  Procedure(s) Performed: COLONOSCOPY WITH PROPOFOL     Patient location during evaluation: PACU Anesthesia Type: MAC Level of consciousness: awake and alert Pain management: pain level controlled Vital Signs Assessment: post-procedure vital signs reviewed and stable Respiratory status: spontaneous breathing, nonlabored ventilation and respiratory function stable Cardiovascular status: blood pressure returned to baseline Postop Assessment: no apparent nausea or vomiting Anesthetic complications: no   No notable events documented.  Last Vitals:  Vitals:   09/26/21 1050 09/26/21 1100  BP: (!) 121/51 135/80  Pulse: (!) 55 (!) 55  Resp: 20 19  Temp:    SpO2: 98% 97%    Last Pain:  Vitals:   09/26/21 1100  TempSrc:   PainSc: 0-No pain                 Marthenia Rolling

## 2021-09-26 NOTE — Discharge Instructions (Addendum)
Colonoscopy  Post procedure instructions:  Read the instructions outlined below and refer to this sheet in the next few weeks. These discharge instructions provide you with general information on caring for yourself after you leave the hospital. Your doctor may also give you specific instructions. While your treatment has been planned according to the most current medical practices available, unavoidable complications occasionally occur. If you have any problems or questions after discharge, call Dr. Paulita Fujita at [facility] [phone]).  HOME CARE INSTRUCTIONS  ACTIVITY: You may resume your regular activity, but move at a slower pace for the next 24 hours.  Take frequent rest periods for the next 24 hours.  Walking will help get rid of the air and reduce the bloated feeling in your belly (abdomen).  No driving for 24 hours (because of the medicine (anesthesia) used during the test).  You may shower.  Do not sign any important legal documents or operate any machinery for 24 hours (because of the anesthesia used during the test).  NUTRITION: Drink plenty of fluids.  You may resume your normal diet as instructed by your doctor.  Begin with a light meal and progress to your normal diet. Heavy or fried foods are harder to digest and may make you feel sick to your stomach (nauseated).  Avoid alcoholic beverages for 24 hours or as instructed.  MEDICATIONS: You may resume your normal medications unless your doctor tells you otherwise.  WHAT TO EXPECT TODAY: Some feelings of bloating in the abdomen.  Passage of more gas than usual.  Spotting of blood in your stool or on the toilet paper.  IF YOU HAD POLYPS REMOVED DURING THE COLONOSCOPY: No aspirin products for 7 days or as instructed.  No alcohol for 7 days or as instructed.  Eat a soft diet for the next 24 hours.   FINDING OUT THE RESULTS OF YOUR TEST  Not all test results are available during your visit. If your test results are  not back during the visit, make an appointment with your caregiver to find out the results. Do not assume everything is normal if you have not heard from your caregiver or the medical facility. It is important for you to follow up on all of your test results.     SEEK IMMEDIATE MEDICAL CARE IF:  You have more than a spotting of blood in your stool.  Your belly is swollen (abdominal distention).  You are nauseated or vomiting.  You have a fever.  You have abdominal pain or discomfort that is severe or gets worse throughout the day.    Document Released: 08/22/2003 Document Revised: 09/19/2010 Document Reviewed: 08/20/2007 [facility] Patient Information 2012 [employer]   YOU HAD AN ENDOSCOPIC PROCEDURE TODAY: Refer to the procedure report and other information in the discharge instructions given to you for any specific questions about what was found during the examination. If this information does not answer your questions, please call [facility] office at [phone] to clarify.   YOU SHOULD EXPECT: Some feelings of bloating in the abdomen. Passage of more gas than usual. Walking can help get rid of the air that was put into your GI tract during the procedure and reduce the bloating. If you had a lower endoscopy (such as a colonoscopy or flexible sigmoidoscopy) you may notice spotting of blood in your stool or on the toilet paper. Some abdominal soreness may be present for a day or two, also.  DIET: Your first meal following the procedure should be a  light meal and then it is ok to progress to your normal diet. A half-sandwich or bowl of soup is an example of a good first meal. Heavy or fried foods are harder to digest and may make you feel nauseous or bloated. Drink plenty of fluids but you should avoid alcoholic beverages for 24 hours. If you had a esophageal dilation, please see attached instructions for diet.    ACTIVITY: Your care partner should take you home directly after the  procedure. You should plan to take it easy, moving slowly for the rest of the day. You can resume normal activity the day after the procedure however YOU SHOULD NOT DRIVE, use power tools, machinery or perform tasks that involve climbing or major physical exertion for 24 hours (because of the sedation medicines used during the test).   SYMPTOMS TO REPORT IMMEDIATELY: A gastroenterologist can be reached at any hour. Please call [phone]  for any of the following symptoms:  Following lower endoscopy (colonoscopy, flexible sigmoidoscopy) Excessive amounts of blood in the stool  Significant tenderness, worsening of abdominal pains  Swelling of the abdomen that is new, acute  Fever of 100 or higher  FOLLOW UP:  If any biopsies were taken you will be contacted by phone or by letter within the next 1-3 weeks. Call [phone]  if you have not heard about the biopsies in 3 weeks.  Please also call with any specific questions about appointments or follow up tests.

## 2021-09-26 NOTE — Op Note (Signed)
[facility] Patient Name: Rodney Burns Procedure Date: 09/26/2021 MRN: [id number] Attending MD: Arta Silence , MD Date of Birth: 1945-05-03 CSN: [id number] Age: 76 Admit Type: Outpatient Procedure:                Colonoscopy Indications:              Last colonoscopy: 2018, Family history of colon                            cancer in a first-degree relative before age 29                            years, Personal history of colonic polyps, Change                            in bowel habits Providers:                Arta Silence, MD, Jaci Carrel, RN, Luan Moore, Technician, Stephanie [place] CRNA Referring MD:              Medicines:                Monitored Anesthesia Care Complications:            No immediate complications. Estimated Blood Loss:     Estimated blood loss: none. Procedure:                Pre-Anesthesia Assessment:                           - Prior to the procedure, a History and Physical                            was performed, and patient medications and                            allergies were reviewed. The patient's tolerance of                            previous anesthesia was also reviewed. The risks                            and benefits of the procedure and the sedation                            options and risks were discussed with the patient.                            All questions were answered, and informed consent                            was obtained. Prior Anticoagulants: The patient has  taken Xarelto (rivaroxaban), last dose was 2 days                            prior to procedure. ASA Grade Assessment: IV - A                            patient with severe systemic disease that is a                            constant threat to life. After reviewing the risks                            and benefits, the patient was deemed in                            satisfactory  condition to undergo the procedure.                           After obtaining informed consent, the colonoscope                            was passed under direct vision. Throughout the                            procedure, the patient's blood pressure, pulse, and                            oxygen saturations were monitored continuously. The                            PCF-HQ190L ([id number]) Olympus colonoscope was                            introduced through the anus and advanced to the the                            cecum, identified by appendiceal orifice and                            ileocecal valve. The ileocecal valve, appendiceal                            orifice, and rectum were photographed. The entire                            colon was examined. The colonoscopy was performed                            without difficulty. The patient tolerated the                            procedure well. The quality of the bowel  preparation was good. Scope In: 10:15:57 AM Scope Out: 10:29:26 AM Scope Withdrawal Time: 0 hours 9 minutes 13 seconds  Total Procedure Duration: 0 hours 13 minutes 29 seconds  Findings:      Hemorrhoids were found on perianal exam.      Internal hemorrhoids were found during retroflexion. The hemorrhoids       were moderate.      A few small-mouthed diverticula were found in the sigmoid colon.      Colon otherwise normal; no other polyps, masses, vascular ectasias, or       inflammatory changes were seen. Impression:               - Hemorrhoids found on perianal exam.                           - Internal hemorrhoids.                           - Diverticulosis in the sigmoid colon.                           - No specimens collected.                           - The examination was otherwise normal. Moderate Sedation:      None Recommendation:           - Patient has a contact number available for                            emergencies.  The signs and symptoms of potential                            delayed complications were discussed with the                            patient. Return to normal activities tomorrow.                            Written discharge instructions were provided to the                            patient.                           - Discharge patient to home (via wheelchair).                           - High fiber diet today.                           - Continue present medications.                           - Resume Xarelto (rivaroxaban) at prior dose today.                           - Repeat colonoscopy is not recommended due to  current age (3 years or older) for surveillance.                           - Return to GI clinic PRN.                           - Return to referring physician as previously                            scheduled. Procedure Code(s):        --- Professional ---                           [phone], Colonoscopy, flexible; diagnostic, including                            collection of specimen(s) by brushing or washing,                            when performed (separate procedure) Diagnosis Code(s):        --- Professional ---                           K64.8, Other hemorrhoids                           Z80.0, Family history of malignant neoplasm of                            digestive organs                           Z86.010, Personal history of colonic polyps                           R19.4, Change in bowel habit                           K57.30, Diverticulosis of large intestine without                            perforation or abscess without bleeding CPT copyright 2019 American [employer] Association. All rights reserved. The codes documented in this report are preliminary and upon coder review may  be revised to meet current compliance requirements. Arta Silence, MD 09/26/2021 10:36:22 AM This report has been signed electronically. Number of  Addenda: 0

## 2021-09-26 NOTE — Transfer of Care (Signed)
Immediate Anesthesia Transfer of Care Note  Patient: Rodney Burns  Procedure(s) Performed: COLONOSCOPY WITH PROPOFOL  Patient Location: PACU and Endoscopy Unit  Anesthesia Type:MAC  Level of Consciousness: awake, alert  and oriented  Airway & Oxygen Therapy: Patient Spontanous Breathing  Post-op Assessment: Report given to RN and Post -op Vital signs reviewed and stable  Post vital signs: Reviewed and stable  Last Vitals:  Vitals Value Taken Time  BP    Temp    Pulse    Resp    SpO2      Last Pain:  Vitals:   09/26/21 0932  TempSrc: Temporal  PainSc: 0-No pain         Complications: No notable events documented.

## 2021-09-28 ENCOUNTER — Encounter (HOSPITAL_COMMUNITY): Payer: Self-pay | Admitting: Gastroenterology

## 2021-10-05 DIAGNOSIS — R790 Abnormal level of blood mineral: Secondary | ICD-10-CM | POA: Diagnosis not present

## 2021-10-05 DIAGNOSIS — G2581 Restless legs syndrome: Secondary | ICD-10-CM | POA: Diagnosis not present

## 2021-10-17 ENCOUNTER — Ambulatory Visit (INDEPENDENT_AMBULATORY_CARE_PROVIDER_SITE_OTHER): Payer: Medicare Other | Admitting: Nurse Practitioner

## 2021-10-17 ENCOUNTER — Encounter: Payer: Self-pay | Admitting: Nurse Practitioner

## 2021-10-17 VITALS — BP 108/64 | HR 41 | Temp 97.9°F | Ht 70.0 in | Wt 210.0 lb

## 2021-10-17 DIAGNOSIS — J849 Interstitial pulmonary disease, unspecified: Secondary | ICD-10-CM | POA: Diagnosis not present

## 2021-10-17 DIAGNOSIS — I5022 Chronic systolic (congestive) heart failure: Secondary | ICD-10-CM

## 2021-10-17 DIAGNOSIS — J432 Centrilobular emphysema: Secondary | ICD-10-CM

## 2021-10-17 DIAGNOSIS — G4733 Obstructive sleep apnea (adult) (pediatric): Secondary | ICD-10-CM | POA: Diagnosis not present

## 2021-10-17 MED ORDER — TRELEGY ELLIPTA 100-62.5-25 MCG/ACT IN AEPB: 1.0000 | INHALATION_SPRAY | Freq: Every day | RESPIRATORY_TRACT | 5 refills | Status: DC

## 2021-10-17 NOTE — Assessment & Plan Note (Signed)
Previous trial of Trelegy; he benefited from the change. He received multiple Anoro inhalers from [employer] so he is working on finishing these up before making the official change. He only has one or two left. Rx sent for Trelegy so he can start these once he completes the Anoro.

## 2021-10-17 NOTE — Assessment & Plan Note (Signed)
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 HRCTfrom June stable.Previous walking oximetry without desaturations. PFTs from April stable.

## 2021-10-17 NOTE — Assessment & Plan Note (Signed)
Appears compensated on exam today. Follow up with cardiology as scheduled.

## 2021-10-17 NOTE — Patient Instructions (Addendum)
Continue BiPAP nightly. Wear for a minimum of 4-6 hours a night. Attend mask fitting at [facility] - someone will contact you for scheduling   -Finish up Anoro and then start Trelegy 1 puff daily. Brush tongue and rinse mouth afterwards.  -Continue Albuterol inhaler 2 puffs every 6 hours as needed for shortness of breath or wheezing. Notify if symptoms persist despite rescue inhaler/neb use. -Continue Claritin 10 mg daily -Continue Flonase 2 sprays each nostril daily  -Continue Mucinex 600 mg Twice daily for chest congestion and nasal drainage   Follow up in 6 weeks with Dr. [name] or Roxan Diesel NP to see how new mask is working. If symptoms do not improve or worsen, please contact office for sooner follow up or seek emergency care.

## 2021-10-17 NOTE — Assessment & Plan Note (Signed)
Underwent CPAP titration in 04/2021 with poor control of OSA on CPAP so he was transitioned to BiPAP.  He has had much better control on BiPAP with residual AHI 3.3 even despite large leaks.  He is also receiving better benefit from use.  He does feel like adjusting his settings to AutoSet has helped. Still experiencing large leaks; sounds like his mask does not fit properly. We will send him for mask fitting.   Patient Instructions  Continue BiPAP nightly. Wear for a minimum of 4-6 hours a night. Attend mask fitting at [facility] - someone will contact you for scheduling   -Finish up Anoro and then start Trelegy 1 puff daily. Brush tongue and rinse mouth afterwards.  -Continue Albuterol inhaler 2 puffs every 6 hours as needed for shortness of breath or wheezing. Notify if symptoms persist despite rescue inhaler/neb use. -Continue Claritin 10 mg daily -Continue Flonase 2 sprays each nostril daily  -Continue Mucinex 600 mg Twice daily for chest congestion and nasal drainage   Follow up in 6 weeks with Dr. [name] or Roxan Diesel NP to see how new mask is working. If symptoms do not improve or worsen, please contact office for sooner follow up or seek emergency care.

## 2021-10-17 NOTE — Progress Notes (Signed)
$'@Patient'o$  ID: Rodney Burns, male    DOB: January 11, 1946, 76 y.o.   MRN: [id number]  Chief Complaint  Patient presents with   Follow-up    Referring provider: No ref. provider found  HPI: 76 year old male, former smoker (60-pack-year history) followed for OSA, emphysema and interstitial pulmonary disease (probable UIP) post-COVID.  He is followed by Dr. [name] for ILD and Dr. Ander Slade for OSA.  Last seen in office by Haruye Lainez NP on 09/19/2021.  Past medical history significant for hypertension, ischemic cardiomyopathy, pericarditis, A-fib on Xarelto, chronic systolic CHF, HLD, CLL, history of CABG x5.  He is followed by Dr. [name] for monitoring of CLL.  TEST/EVENTS:  05/05/2019 CPAP titration: AHI 22.8/h, SPO2 low 85%.  Optimal setting 14 cmH2O.  Moderate obstructive sleep apnea. 10/19/2019 PFTs: FVC 4.01 (91), FEV1 3.08 (96), ratio 77, TLC 84%, DLCO uncorrected 55%.  No BD.  Overall normal spirometry with moderately severe diffusion defect. 12/06/2020 HRCT: There are coronary artery calcifications status post CABG.  Atherosclerosis is present.  Numerous nonenlarged mediastinal nodes, unchanged.  Mild bilateral air trapping.  Centrilobular and paraseptal emphysema.  Subpleural predominant reticular and groundglass opacities within mid and lower lung predominance.  There is mild traction bronchiolectasis.  Stable solid pulmonary nodule right lower lobe 6 mm, likely fibrosis.  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 05/07/2021 PFTs: FVC 91, FEV1 94, ratio 75, TLC 80, DLCO corrected for alveolar volume 55%.  No significant BD; did have some mid flow reversibility.  Overall normal spirometry with stable diffusion defect 05/16/2021 CPAP titration: AHI 12.7 on CPAP; transition to BiPAP therapy.  Appropriate setting of 22/18 cmH2O with residual AHI 0.8 06/27/2021 HRCT chest: Atherosclerosis.  Status post median sternotomy with CABG including lima to left circumflex territory.  Mild dilatation of the pulmonic  trunk.  No LAD.  Widespread areas of groundglass attenuation, septal thickening, subpleural reticulation and peripheral bronchiolectasis are noted throughout the lungs bilaterally.  Findings have mild craniocaudal gradient.  No definite progression compared to the prior study.  There is mild centrilobular and paraseptal emphysema.  No evidence of honeycombing.  Probable UIP.  03/09/2021: OV with Palak Tercero NP.  Reported some issues with CPAP machine.  Wife reported that it is become very noisy at night.  Unable to obtain formal download.  Patient's download showed significant mask leaks, despite the new mask he has, and high AHI's ranging from 10 to 50/h.  Averages about 5 hours and 48 minutes a night, which is usually about how long he sleeps for.  Recently started melatonin which has helped lengthen his sleep.  Feels as though he sleeps well at night otherwise.  Currently using a nasal mask.  Change CPAP settings to 10-20 and ordered mask desensitization study.  Reported progressive DOE over the past few months and occasional cough with clear sputum.  Also noted intermittent swelling of both his lower extremities.  CXR without acute process; chronic, slightly improved ILD was present.  Recent HRCT showed stable fibrotic changes.  Will order PFTs for further evaluation.  BNP was elevated.  DOE primarily especially noted to be related to CHF with EF 35%.  05/07/2021: OV with Jayne Peckenpaugh NP for follow-up after pulmonary function testing.  PFTs were overall stable when compared to a year ago.  Did have some mid flow reversibility which was not identified on previous testing.  Suspected that diffusion defect was multifactorial including systolic CHF.  At [facility] patient reported feeling relatively the same as he did previous visit.  He was planned  to have a cardioversion with cardiology for A-fib but converted to normal sinus rhythm prior to the procedure.  Has felt somewhat better since then.  Still with a minimally productive cough.   Leg swelling has improved.  Continues to wear CPAP nightly.  Did feel like mask fit better.  Unfortunately download still showed residual AHI of 11.9/h.  CPAP titration study ordered with plans to transition to BiPAP if not well controlled on CPAP.  Advised to continue on Anoro, Claritin and Flonase.  As needed albuterol.  IPF and emphysema appeared overall stable with recent walking oximetry without desaturations.  Last HRCT was from November.  Suspected some of his progressive DOE was likely related to CHF and ongoing cardiac problems.  05/31/2021: OV with Cambridge Deleo NP for follow-up after CPAP titration study which showed that his sleep apnea was not controlled on CPAP therapy so he was transitioned to BiPAP therapy. Since he was seen last, reports DOE and cough overall the same.  He has had progressive decline in activity tolerance over the last 6 months to a year, which we obtain PFTs for at his last visit that overall looked stable.  He recently wore a heart monitor due to bradycardia and hx of a fib; has yet to get the results.  Lower extremity swelling has mostly resolved.  He denies any hemoptysis, recent weight loss, anorexia, wheezing.  Feels like he sleeps relatively well at night.  Denies morning headaches or drowsy driving.  He continues on Anoro.  Rarely uses albuterol.  09/19/2021: OV with Manjot Hinks NP for follow up. He has been doing well since I saw him last. He actually feels like his breathing has improved. No increased cough or chest congestion. Heart rate has improved; he is still in an irregular rhythm. Plan is to continue with rate control therapy for time being. He has started on BiPAP therapy. He is getting better sleep at night; he states that he has been sleeping about 1.5 hours longer every night. Wakes feeling like he slept better. He is having issues with his mask leaking; his wife notices a whistling noise. He also has noticed that his humidification chamber is dry in the morning. He has  talked to the DME company but hasn't gotten much help on fixing the issue. He is wearing a small/medium full face mask, which is what he was tried on during his titration. Denies any drowsy driving. Underwent CPAP titration in 04/2021 with poor control of OSA on CPAP so he was transitioned to BiPAP. He has started on this. Breakthrough events are better controlled, especially over the past few weeks, but he is having significant mask leaks. We will adjust his BiPAP settings to auto set IPAP max 22, EPAP min 12, and PS 4. If he continues to have problems with leaks, we will send him for mask fitting. Cautioned to be aware of drowsy driving. Provided with sample of Trelegy to try given midflow reversibility on PFTs  10/17/2021: Today - follow up Patient presents today for follow up after adjusting BiPAP settings. He tells me that he's still having some leaks, especially around the corners of his mouth. He's tried to see if there is a different size mask but was told by his DME that the M/L would not fit him. He is sleeping well at night. Getting about 1.5-2 hr more sleep than he was on CPAP. Wakes feeling better rested. He denies morning headaches or drowsy driving.  He still has some Anoro left at home.  He's almost out. He does feel like the Trelegy has helped his breathing so he would like to switch prescriptions after he runs out. He denies any increased shortness of breath, cough or chest congestion. Hasn't noticed much wheezing.   09/16/2021-10/15/2021: BiPAP auto max IPAP 22, min EPAP 12, PS 4 30/30 days; 100% > 4hr; av use 6 hours Leaks median 32.8, 95th 76.2 Pressure 95th EPAP 15.8, IPAP 19.7 AHI 3.3  Allergies  Allergen Reactions   Penicillins Other (See Comments)    Put pt in hospital 50 yrs ago   Ace Inhibitors Cough   Darvocet [Propoxyphene N-Acetaminophen] Rash    Tolerates tylenol   Sulfa Antibiotics Rash    Immunization History  Administered Date(s) Administered   Fluad Quad(high  Dose 65+) 10/23/2020   Influenza Split 11/23/2015   Influenza, High Dose Seasonal PF 10/27/2013, 11/03/2014, 11/23/2015, 10/26/2016, 09/28/2018   Influenza,inj,Quad PF,6+ Mos 11/07/2010, 10/22/2012, 10/23/2020   Influenza-Unspecified 11/21/2013, 11/04/2016   PFIZER(Purple Top)SARS-COV-2 Vaccination 04/02/2019, 04/22/2019, 09/21/2019   Pneumococcal Conjugate-13 02/23/2016   Pneumococcal Polysaccharide-23 11/18/2011   Tdap 03/05/2011    Past Medical History:  Diagnosis Date   Atrial tachycardia ([place])    a. s/p DCCV 09/2009; b. s/p RFCA 03/2011; c. s/p DCCV 04/2011; d. s/p RFCA 09/17/11.   Cataract    Chronic systolic CHF (congestive heart failure) ([place])    a. 01/2015 Echo: EF 40-45%, antsept/infsept HK, triv AI, mild MR, mod dil LA, nl RV, mildly dil RA.   CLL (chronic lymphoblastic leukemia)    Stage 0-1   Coronary artery disease involving native coronary artery without angina pectoris    a. Status post CABG in 1994:By Dr. Cyndia Bent. LIMA - L Cx, seqSVG-Diag-LAD, and SVG-PDA-PL.   Cough    Consistant with ACE inhibitor mediated cough   Dysrhythmia    Glaucoma (increased eye pressure) 1991   History of tobacco abuse    quit 1994   Hypercholesterolemia    Well Controlled   Hypertension    Ischemic cardiomyopathy    a. 02/2011 Echo: EF 40-45%;  b. 01/2015 Echo: EF 40-45%.   Malaria 1972   Hx of   Pericarditis    a. 01/2015-->Treated w/ ibuprofen/colchicine.   Sleep-disordered breathing 06/20/2011    Tobacco History: Social History   Tobacco Use  Smoking Status Former   Packs/day: 2.00   Years: 30.00   Total pack years: 60.00   Types: Cigarettes   Quit date: 01/22/1992   Years since quitting: 29.7  Smokeless Tobacco Never   Counseling given: Not Answered   Outpatient Medications Prior to Visit  Medication Sig Dispense Refill   docusate sodium (COLACE) 100 MG capsule Take 100 mg by mouth 2 (two) times daily.     ENTRESTO 49-51 MG TAKE 1 TABLET TWICE A DAY 180 tablet 3    ezetimibe-simvastatin (VYTORIN) 10-20 MG tablet TAKE 1 TABLET AT BEDTIME (KEEP UPCOMING APPOINTMENT FOR REFILLS) 90 tablet 3   Fiber, Guar Gum, CHEW Chew 3 each by mouth daily.     fluticasone (FLONASE) 50 MCG/ACT nasal spray Place 2 sprays into both nostrils daily. (Patient taking differently: Place 2 sprays into both nostrils daily as needed for rhinitis.) 18.2 mL 3   furosemide (LASIX) 20 MG tablet Take 1 tablet (20 mg total) by mouth daily. 90 tablet 3   latanoprost (XALATAN) 0.005 % ophthalmic solution Place 1 drop into both eyes at bedtime.     loratadine (CLARITIN) 10 MG tablet Take 1 tablet (10 mg total) by mouth daily.  90 tablet 3   Melatonin 5 MG CHEW Chew 5 mg by mouth at bedtime.     metoprolol succinate (TOPROL-XL) 50 MG 24 hr tablet TAKE 1 TABLET IN THE MORNING AND ONE-HALF (1/2) TABLET IN THE EVENING (Patient taking differently: Take 25 mg by mouth 2 (two) times daily.) 135 tablet 3   pramipexole (MIRAPEX) 1.5 MG tablet Take 1.5 mg by mouth at bedtime.     RABEprazole (ACIPHEX) 20 MG tablet Take 1 tablet (20 mg total) by mouth daily. 90 tablet 3   spironolactone (ALDACTONE) 25 MG tablet TAKE 1 TABLET DAILY 45 tablet 7   XARELTO 20 MG TABS tablet TAKE 1 TABLET DAILY WITH SUPPER 90 tablet 3   ANORO ELLIPTA 62.5-25 MCG/ACT AEPB USE 1 INHALATION DAILY (Patient taking differently: Inhale 1 puff into the lungs daily.) 360 each 3   Fluticasone-Umeclidin-Vilant (TRELEGY ELLIPTA) 100-62.5-25 MCG/ACT AEPB Inhale 1 puff into the lungs daily. 1 each 0   Iron, Ferrous Sulfate, 325 (65 Fe) MG TABS Take 325 mg by mouth daily.     No facility-administered medications prior to visit.     Review of Systems:   Constitutional: No weight loss or gain, night sweats, fevers, chills. +daytime fatigue (improved) HEENT: No headaches, difficulty swallowing, tooth/dental problems, or sore throat. No sneezing, itching, ear ache, nasal congestion, or post nasal drip CV:  +occasional BLE edema (mostly in  evenings). No chest pain, orthopnea, PND, anasarca, dizziness, palpitations, syncope Resp: +shortness of breath with exertion (improved); occasionally minimally productive cough (clear). No excess mucus or change in color of mucus. No hemoptysis. No wheezing.  No chest wall deformity GI:  No heartburn, indigestion, abdominal pain, nausea, vomiting, diarrhea, change in bowel habits, loss of appetite, bloody stools.  Skin: No rash, lesions, ulcerations MSK:  No joint pain or swelling.  No decreased range of motion.  No back pain. Neuro: No dizziness or lightheadedness.  Psych: No depression or anxiety. Mood stable.     Physical Exam:  BP 108/64 (BP Location: Left Arm, Patient Position: Sitting, Cuff Size: Normal)   Pulse (!) 41   Temp 97.9 F (36.6 C) (Oral)   Ht '5\' 10"'$  (1.778 m)   Wt 210 lb (95.3 kg)   SpO2 97%   BMI 30.13 kg/m   GEN: Pleasant, interactive, well-appearing; obese; in no acute distress HEENT:  Normocephalic and atraumatic. PERRLA. Sclera white. Nasal turbinates pink, moist and patent bilaterally. No rhinorrhea present. Oropharynx pink and moist, without exudate or edema. No lesions, ulcerations, or postnasal drip.  NECK:  Supple w/ fair ROM. No JVD present. Normal carotid impulses w/o bruits. Thyroid symmetrical with no goiter or nodules palpated. No lymphadenopathy.   CV: Bradycardic, regular rhythm, no m/r/g, no peripheral edema. Pulses intact, +2 bilaterally. No cyanosis, pallor or clubbing. PULMONARY:  Unlabored, regular breathing. Clear bilaterally A&P w/o wheezes/rales/rhonchi. No accessory muscle use. No dullness to percussion. GI: BS present and normoactive. Soft, non-tender to palpation. No organomegaly or masses detected. No CVA tenderness. MSK: No erythema, warmth or tenderness. Cap refil <2 sec all extrem. No deformities or joint swelling noted.  Neuro: A/Ox3. No focal deficits noted.   Skin: Warm, no lesions or rashe Psych: Normal affect and behavior.  Judgement and thought content appropriate.     Lab Results:  CBC    Component Value Date/Time   WBC 17.1 (H) 03/22/2021 0855   WBC 30.2 (H) 12/30/2018 0040   RBC 5.20 03/22/2021 0855   HGB 13.9 03/22/2021 0855   HGB  13.9 03/20/2021 1015   HGB 13.8 08/22/2016 0932   HCT 44.2 03/22/2021 0855   HCT 43.2 03/20/2021 1015   HCT 44.1 08/22/2016 0932   PLT 122 (L) 03/22/2021 0855   PLT 127 (L) 03/20/2021 1015   MCV 85.0 03/22/2021 0855   MCV 84 03/20/2021 1015   MCV 86.0 08/22/2016 0932   MCH 26.7 03/22/2021 0855   MCHC 31.4 03/22/2021 0855   RDW 14.6 03/22/2021 0855   RDW 13.4 03/20/2021 1015   RDW 14.3 08/22/2016 0932   LYMPHSABS 11.7 (H) 03/22/2021 0855   LYMPHSABS 11.4 (H) 03/20/2021 1015   LYMPHSABS 16.6 (H) 08/22/2016 0932   MONOABS 0.8 03/22/2021 0855   MONOABS 0.3 08/22/2016 0932   EOSABS 0.1 03/22/2021 0855   EOSABS 0.1 03/20/2021 1015   BASOSABS 0.0 03/22/2021 0855   BASOSABS 0.0 03/20/2021 1015   BASOSABS 0.0 08/22/2016 0932    BMET    Component Value Date/Time   NA 140 03/22/2021 0855   NA 141 03/20/2021 1015   NA 141 08/22/2016 0932   K 4.7 03/22/2021 0855   K 4.5 08/22/2016 0932   CL 106 03/22/2021 0855   CL 105 01/02/2012 0938   CO2 29 03/22/2021 0855   CO2 28 08/22/2016 0932   GLUCOSE 105 (H) 03/22/2021 0855   GLUCOSE 98 08/22/2016 0932   GLUCOSE 97 01/02/2012 0938   BUN 21 03/22/2021 0855   BUN 20 03/20/2021 1015   BUN 16.6 08/22/2016 0932   CREATININE 1.05 03/22/2021 0855   CREATININE 0.9 08/22/2016 0932   CALCIUM 10.0 03/22/2021 0855   CALCIUM 9.5 08/22/2016 0932   GFRNONAA >60 03/22/2021 0855   GFRAA >60 08/27/2019 0846    BNP    Component Value Date/Time   BNP 304.0 (H) 03/20/2021 1015   BNP 208.8 (H) 12/29/2018 0450     Imaging:  No results found.       Latest Ref Rng & Units 05/07/2021    9:40 AM 10/19/2019    8:47 AM 03/29/2019    1:49 PM  PFT Results  FVC-Pre L 3.82  3.98  3.31   FVC-Predicted Pre % 89  90  74    FVC-Post L 3.91  4.01  3.32   FVC-Predicted Post % 91  91  74   Pre FEV1/FVC % % 73  77  76   Post FEV1/FCV % % 75  77  78   FEV1-Pre L 2.77  3.07  2.52   FEV1-Predicted Pre % 89  96  77   FEV1-Post L 2.92  3.08  2.59   DLCO uncorrected ml/min/mmHg 13.80  14.26  14.25   DLCO UNC% % 54  55  54   DLCO corrected ml/min/mmHg 14.08  14.26  14.21   DLCO COR %Predicted % 55  55  54   DLVA Predicted % 61  62  69   TLC L 5.74  6.12  5.78   TLC % Predicted % 80  84  79   RV % Predicted % 88  84  85     No results found for: "NITRICOXIDE"      Assessment & Plan:   OSA (obstructive sleep apnea) Underwent CPAP titration in 04/2021 with poor control of OSA on CPAP so he was transitioned to BiPAP.  He has had much better control on BiPAP with residual AHI 3.3 even despite large leaks.  He is also receiving better benefit from use.  He does feel like adjusting his settings to  AutoSet has helped. Still experiencing large leaks; sounds like his mask does not fit properly. We will send him for mask fitting.   Patient Instructions  Continue BiPAP nightly. Wear for a minimum of 4-6 hours a night. Attend mask fitting at [facility] - someone will contact you for scheduling   -Finish up Anoro and then start Trelegy 1 puff daily. Brush tongue and rinse mouth afterwards.  -Continue Albuterol inhaler 2 puffs every 6 hours as needed for shortness of breath or wheezing. Notify if symptoms persist despite rescue inhaler/neb use. -Continue Claritin 10 mg daily -Continue Flonase 2 sprays each nostril daily  -Continue Mucinex 600 mg Twice daily for chest congestion and nasal drainage   Follow up in 6 weeks with Dr. [name] or Roxan Diesel NP to see how new mask is working. If symptoms do not improve or worsen, please contact office for sooner follow up or seek emergency care.    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 HRCT from June  stable. Previous walking oximetry without desaturations. PFTs from April stable.   Centrilobular emphysema ([place]) Previous trial of Trelegy; he benefited from the change. He received multiple Anoro inhalers from [employer] so he is working on finishing these up before making the official change. He only has one or two left. Rx sent for Trelegy so he can start these once he completes the Anoro.  Chronic systolic CHF (congestive heart failure) ([place]) Appears compensated on exam today. Follow up with cardiology as scheduled.    I spent 28 minutes of dedicated to the care of this patient on the date of this encounter to include pre-visit review of records, face-to-face time with the patient discussing conditions above, post visit ordering of testing, clinical documentation with the electronic health record, making appropriate referrals as documented, and communicating necessary findings to members of the patients care team.  Clayton Bibles, NP 10/17/2021  Pt aware and understands NP's role.

## 2021-11-06 DIAGNOSIS — K5790 Diverticulosis of intestine, part unspecified, without perforation or abscess without bleeding: Secondary | ICD-10-CM | POA: Diagnosis not present

## 2021-11-07 ENCOUNTER — Other Ambulatory Visit: Payer: Self-pay | Admitting: Cardiology

## 2021-11-07 DIAGNOSIS — I4821 Permanent atrial fibrillation: Secondary | ICD-10-CM

## 2021-11-07 MED ORDER — RIVAROXABAN 20 MG PO TABS: ORAL_TABLET | ORAL | 0 refills | Status: DC

## 2021-11-07 MED ORDER — ENTRESTO 49-51 MG PO TABS: 1.0000 | ORAL_TABLET | Freq: Two times a day (BID) | ORAL | 3 refills | Status: DC

## 2021-11-07 NOTE — Telephone Encounter (Signed)
Prescription refill request for Xarelto received.  Indication: Afib  Last office visit: 09/19/21 Kathlen Mody)  Weight: 95.3kg Age: 76 Scr: 1.05 (03/22/21)  CrCl: 79.52m/min  Appropriate dose and refill sent to requested pharmacy.

## 2021-11-07 NOTE — Telephone Encounter (Signed)
*  STAT* If patient is at the pharmacy, call can be transferred to refill team.   1. Which medications need to be refilled? (please list name of each medication and dose if known) need a prescription for local pharmacy until  mail order comes for Xarelto and Entresto  2. Which pharmacy/location (including street and city if local pharmacy) is medication to be sent to? [place]  3. Do they need a 30 day or 90 day supply? 10 days

## 2021-11-14 ENCOUNTER — Ambulatory Visit (HOSPITAL_BASED_OUTPATIENT_CLINIC_OR_DEPARTMENT_OTHER): Payer: Medicare Other | Attending: Nurse Practitioner | Admitting: Radiology

## 2021-11-14 DIAGNOSIS — G4733 Obstructive sleep apnea (adult) (pediatric): Secondary | ICD-10-CM

## 2021-11-16 ENCOUNTER — Encounter: Payer: Self-pay | Admitting: Hematology and Oncology

## 2021-11-20 ENCOUNTER — Other Ambulatory Visit: Payer: Self-pay

## 2021-11-20 ENCOUNTER — Other Ambulatory Visit: Payer: Self-pay | Admitting: Cardiology

## 2021-11-20 DIAGNOSIS — I4821 Permanent atrial fibrillation: Secondary | ICD-10-CM

## 2021-11-20 MED ORDER — RIVAROXABAN 20 MG PO TABS: ORAL_TABLET | ORAL | 1 refills | Status: DC

## 2021-11-20 MED ORDER — ENTRESTO 49-51 MG PO TABS: 1.0000 | ORAL_TABLET | Freq: Two times a day (BID) | ORAL | 3 refills | Status: DC

## 2021-11-20 NOTE — Telephone Encounter (Addendum)
Xarelto '20mg'$  refill request received. Pt is 76 years old, weight-95.3kg, Crea-1.05 on 03/22/2021, last seen by Richardson Dopp on 09/19/2021, Diagnosis-Afib/flutter, CrCl-79.42 mL/min; Dose is appropriate based on dosing criteria.   Last refill sent to local pharmacy for a limited supply & prior to mail order; will need to call pt to clarify if needs refill sent to local versus mail order   Spoke with wife since unable to reach the pt, she stated that the xarelto needs to be sent to mail order and that he has not received the entresto. Advised it was sent on 11/07/2021 and she asked if it was sent to mail order or local and advised local she states they need it mail order.   Sent the Xarelto to mail order and will send a request to main refill dept for the enstresto refill since pt is out.

## 2021-11-28 ENCOUNTER — Ambulatory Visit (INDEPENDENT_AMBULATORY_CARE_PROVIDER_SITE_OTHER): Payer: Medicare Other | Admitting: Nurse Practitioner

## 2021-11-28 ENCOUNTER — Encounter: Payer: Self-pay | Admitting: Nurse Practitioner

## 2021-11-28 VITALS — BP 128/70 | HR 55 | Temp 97.8°F | Ht 70.0 in | Wt 212.6 lb

## 2021-11-28 DIAGNOSIS — J849 Interstitial pulmonary disease, unspecified: Secondary | ICD-10-CM

## 2021-11-28 DIAGNOSIS — J432 Centrilobular emphysema: Secondary | ICD-10-CM | POA: Diagnosis not present

## 2021-11-28 DIAGNOSIS — G4733 Obstructive sleep apnea (adult) (pediatric): Secondary | ICD-10-CM | POA: Diagnosis not present

## 2021-11-28 NOTE — Assessment & Plan Note (Signed)
Compensated on current regimen. He has transitioned to triple therapy regimen and is doing well on Trelegy. Encouraged to remain active/activity as tolerated.

## 2021-11-28 NOTE — Patient Instructions (Addendum)
Continue BiPAP nightly. Wear for a minimum of 4-6 hours a night.   -Continue Trelegy 1 puff daily. Brush tongue and rinse mouth afterwards.  -Continue Albuterol inhaler 2 puffs every 6 hours as needed for shortness of breath or wheezing. Notify if symptoms persist despite rescue inhaler/neb use. -Continue Claritin 10 mg daily -Continue Flonase 2 sprays each nostril daily  -Continue Mucinex 600 mg Twice daily for chest congestion and nasal drainage   Follow up in April 2024 with repeat PFTs and then office visit with Dr. [name]. If symptoms worsen, please contact office for sooner follow up or seek emergency care.

## 2021-11-28 NOTE — Assessment & Plan Note (Signed)
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 HRCT from June stable. Previous walking oximetry without desaturations. Currently under surveillance. Plan to repeat PFTs in April 2024 for yearly follow up.

## 2021-11-28 NOTE — Progress Notes (Signed)
$'@Patient'y$  ID: Rodney Burns, male    DOB: May 17, 1945, 76 y.o.   MRN: [id number]  Chief Complaint  Patient presents with   Follow-up    Wearing BIPAP-doing well, sob-same, denies cough    Referring provider: [name]  HPI: 76 year old male, former smoker (60-pack-year history) followed for OSA on BiPAP, emphysema and interstitial pulmonary disease (probable UIP) post-COVID.  He is followed by Dr. [name] for ILD and Dr. Ander Slade for OSA.  Last seen in office by [name] NP on 10/17/2021.  Past medical history significant for hypertension, ischemic cardiomyopathy, pericarditis, A-fib on Xarelto, chronic systolic CHF, HLD, CLL, history of CABG x5.  He is followed by Dr. [name] for monitoring of CLL.  TEST/EVENTS:  05/05/2019 CPAP titration: AHI 22.8/h, SPO2 low 85%.  Optimal setting 14 cmH2O.  Moderate obstructive sleep apnea. 10/19/2019 PFTs: FVC 4.01 (91), FEV1 3.08 (96), ratio 77, TLC 84%, DLCO uncorrected 55%.  No BD.  Overall normal spirometry with moderately severe diffusion defect. 12/06/2020 HRCT: There are coronary artery calcifications status post CABG.  Atherosclerosis is present.  Numerous nonenlarged mediastinal nodes, unchanged.  Mild bilateral air trapping.  Centrilobular and paraseptal emphysema.  Subpleural predominant reticular and groundglass opacities within mid and lower lung predominance.  There is mild traction bronchiolectasis.  Stable solid pulmonary nodule right lower lobe 6 mm, likely fibrosis.  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 05/07/2021 PFTs: FVC 91, FEV1 94, ratio 75, TLC 80, DLCO corrected for alveolar volume 55%.  No significant BD; did have some mid flow reversibility.  Overall normal spirometry with stable diffusion defect 05/16/2021 CPAP titration: AHI 12.7 on CPAP; transition to BiPAP therapy.  Appropriate setting of 22/18 cmH2O with residual AHI 0.8 06/27/2021 HRCT chest: Atherosclerosis.  Status post median sternotomy with CABG including lima  to left circumflex territory.  Mild dilatation of the pulmonic trunk.  No LAD.  Widespread areas of groundglass attenuation, septal thickening, subpleural reticulation and peripheral bronchiolectasis are noted throughout the lungs bilaterally.  Findings have mild craniocaudal gradient.  No definite progression compared to the prior study.  There is mild centrilobular and paraseptal emphysema.  No evidence of honeycombing.  Probable UIP.  03/09/2021: OV with [name].  Reported some issues with CPAP machine.  Wife reported that it is become very noisy at night.  Unable to obtain formal download.  Patient's download showed significant mask leaks, despite the new mask he has, and high AHI's ranging from 10 to 50/h.  Averages about 5 hours and 48 minutes a night, which is usually about how long he sleeps for.  Recently started melatonin which has helped lengthen his sleep.  Feels as though he sleeps well at night otherwise.  Currently using a nasal mask.  Change CPAP settings to 10-20 and ordered mask desensitization study.  Reported progressive DOE over the past few months and occasional cough with clear sputum.  Also noted intermittent swelling of both his lower extremities.  CXR without acute process; chronic, slightly improved ILD was present.  Recent HRCT showed stable fibrotic changes.  Will order PFTs for further evaluation.  BNP was elevated.  DOE primarily especially noted to be related to CHF with EF 35%.  05/07/2021: OV with [name] for follow-up after pulmonary function testing.  PFTs were overall stable when compared to a year ago.  Did have some mid flow reversibility which was not identified on previous testing.  Suspected that diffusion defect was multifactorial including systolic CHF.  At [place] patient reported feeling relatively  the same as he did previous visit.  He was planned to have a cardioversion with cardiology for A-fib but converted to normal sinus rhythm prior to the procedure.  Has felt  somewhat better since then.  Still with a minimally productive cough.  Leg swelling has improved.  Continues to wear CPAP nightly.  Did feel like mask fit better.  Unfortunately download still showed residual AHI of 11.9/h.  CPAP titration study ordered with plans to transition to BiPAP if not well controlled on CPAP.  Advised to continue on Anoro, Claritin and Flonase.  As needed albuterol.  IPF and emphysema appeared overall stable with recent walking oximetry without desaturations.  Last HRCT was from November.  Suspected some of his progressive DOE was likely related to CHF and ongoing cardiac problems.  05/31/2021: OV with [name] for follow-up after CPAP titration study which showed that his sleep apnea was not controlled on CPAP therapy so he was transitioned to BiPAP therapy. Since he was seen last, reports DOE and cough overall the same.  He has had progressive decline in activity tolerance over the last 6 months to a year, which we obtain PFTs for at his last visit that overall looked stable.  He recently wore a heart monitor due to bradycardia and hx of a fib; has yet to get the results.  Lower extremity swelling has mostly resolved.  He denies any hemoptysis, recent weight loss, anorexia, wheezing.  Feels like he sleeps relatively well at night.  Denies morning headaches or drowsy driving.  He continues on Anoro.  Rarely uses albuterol.  09/19/2021: OV with [name] for follow up. He has been doing well since I saw him last. He actually feels like his breathing has improved. No increased cough or chest congestion. Heart rate has improved; he is still in an irregular rhythm. Plan is to continue with rate control therapy for time being. He has started on BiPAP therapy. He is getting better sleep at night; he states that he has been sleeping about 1.5 hours longer every night. Wakes feeling like he slept better. He is having issues with his mask leaking; his wife notices a whistling noise. He also has  noticed that his humidification chamber is dry in the morning. He has talked to the DME company but hasn't gotten much help on fixing the issue. He is wearing a small/medium full face mask, which is what he was tried on during his titration. Denies any drowsy driving. Underwent CPAP titration in 04/2021 with poor control of OSA on CPAP so he was transitioned to BiPAP. He has started on this. Breakthrough events are better controlled, especially over the past few weeks, but he is having significant mask leaks. We will adjust his BiPAP settings to auto set IPAP max 22, EPAP min 12, and PS 4. If he continues to have problems with leaks, we will send him for mask fitting. Cautioned to be aware of drowsy driving. Provided with sample of Trelegy to try given midflow reversibility on PFTs  10/17/2021: OV with Windle Huebert NP for follow up after adjusting BiPAP settings. He tells me that he's still having some leaks, especially around the corners of his mouth. He's tried to see if there is a different size mask but was told by his DME that the M/L would not fit him. He is sleeping well at night. Getting about 1.5-2 hr more sleep than he was on CPAP. Wakes feeling better rested. He denies morning headaches or drowsy driving.  He still has some Anoro left at home. He's almost out. He does feel like the Trelegy has helped his breathing so he would like to switch prescriptions after he runs out. He denies any increased shortness of breath, cough or chest congestion. Hasn't noticed much wheezing. Order mask fitting.  11/28/2021: Today - follow up Patient presents today for follow up. After our last visit, he went for a mask fitting; tried a different full face mask but leaks seemed to be worse. He is back on his Dreamwear mask. Seems to have better seal and significantly less leaks on his download today. He is feeling well. Sleeping well at night and wakes feeling much more rested. He's getting at least 2 hours more of sleep. Feels  like it has improved his stamina/activity tolerance during the day as well. Breathing feels better with Trelegy inhaler. Still gets short winded with longer distances or climbing. Able to do things around the house without difficulty. Denies increased cough or chest congestion. No morning headaches or drowsy driving.   10/29/2021-11/27/2021 BiPAP Auto IPAP max 22; EPAP min 12; PS 4 30/30 days; 100% >4 hr; av usage 7 hr 21 min Leaks median 1.6, 95th 22.2 AHI 1.2  Allergies  Allergen Reactions   Penicillins Other (See Comments)    Put pt in hospital 50 yrs ago   Ace Inhibitors Cough   Darvocet [Propoxyphene N-Acetaminophen] Rash    Tolerates tylenol   Sulfa Antibiotics Rash    Immunization History  Administered Date(s) Administered   Fluad Quad(high Dose 65+) 10/23/2020, 11/23/2021   Influenza Split 11/23/2015   Influenza, High Dose Seasonal PF 10/27/2013, 11/03/2014, 11/23/2015, 10/26/2016, 09/28/2018   Influenza,inj,Quad PF,6+ Mos 11/07/2010, 10/22/2012, 10/23/2020   Influenza-Unspecified 11/21/2013, 11/04/2016   PFIZER(Purple Top)SARS-COV-2 Vaccination 04/02/2019, 04/22/2019, 09/21/2019   Pneumococcal Conjugate-13 02/23/2016   Pneumococcal Polysaccharide-23 11/18/2011   Rsv, Bivalent, Protein Subunit Rsvpref,pf Evans Lance) 11/23/2021   Tdap 03/05/2011    Past Medical History:  Diagnosis Date   Atrial tachycardia    a. s/p DCCV 09/2009; b. s/p RFCA 03/2011; c. s/p DCCV 04/2011; d. s/p RFCA 09/17/11.   Cataract    Chronic systolic CHF (congestive heart failure) ([place])    a. 01/2015 Echo: EF 40-45%, antsept/infsept HK, triv AI, mild MR, mod dil LA, nl RV, mildly dil RA.   CLL (chronic lymphoblastic leukemia)    Stage 0-1   Coronary artery disease involving native coronary artery without angina pectoris    a. Status post CABG in 1994:By Dr. Cyndia Bent. LIMA - L Cx, seqSVG-Diag-LAD, and SVG-PDA-PL.   Cough    Consistant with ACE inhibitor mediated cough   Dysrhythmia    Glaucoma  (increased eye pressure) 1991   History of tobacco abuse    quit 1994   Hypercholesterolemia    Well Controlled   Hypertension    Ischemic cardiomyopathy    a. 02/2011 Echo: EF 40-45%;  b. 01/2015 Echo: EF 40-45%.   Malaria 1972   Hx of   Pericarditis    a. 01/2015-->Treated w/ ibuprofen/colchicine.   Sleep-disordered breathing 06/20/2011    Tobacco History: Social History   Tobacco Use  Smoking Status Former   Packs/day: 2.00   Years: 30.00   Total pack years: 60.00   Types: Cigarettes   Quit date: 01/22/1992   Years since quitting: 29.8  Smokeless Tobacco Never   Counseling given: Not Answered   Outpatient Medications Prior to Visit  Medication Sig Dispense Refill   docusate sodium (COLACE) 100 MG capsule Take 100  mg by mouth 2 (two) times daily.     ezetimibe-simvastatin (VYTORIN) 10-20 MG tablet TAKE 1 TABLET AT BEDTIME (KEEP UPCOMING APPOINTMENT FOR REFILLS) 90 tablet 3   Fiber, Guar Gum, CHEW Chew 3 each by mouth daily.     fluticasone (FLONASE) 50 MCG/ACT nasal spray Place 2 sprays into both nostrils daily. (Patient taking differently: Place 2 sprays into both nostrils daily as needed for rhinitis.) 18.2 mL 3   Fluticasone-Umeclidin-Vilant (TRELEGY ELLIPTA) 100-62.5-25 MCG/ACT AEPB Inhale 1 puff into the lungs daily. 1 each 5   furosemide (LASIX) 20 MG tablet Take 1 tablet (20 mg total) by mouth daily. 90 tablet 3   latanoprost (XALATAN) 0.005 % ophthalmic solution Place 1 drop into both eyes at bedtime.     loratadine (CLARITIN) 10 MG tablet Take 1 tablet (10 mg total) by mouth daily. 90 tablet 3   Melatonin 5 MG CHEW Chew 5 mg by mouth at bedtime.     metoprolol succinate (TOPROL-XL) 50 MG 24 hr tablet TAKE 1 TABLET IN THE MORNING AND ONE-HALF (1/2) TABLET IN THE EVENING (Patient taking differently: Take 25 mg by mouth 2 (two) times daily.) 135 tablet 3   pramipexole (MIRAPEX) 1.5 MG tablet Take 1.5 mg by mouth at bedtime.     RABEprazole (ACIPHEX) 20 MG tablet Take 1  tablet (20 mg total) by mouth daily. 90 tablet 3   rivaroxaban (XARELTO) 20 MG TABS tablet TAKE 1 TABLET DAILY WITH SUPPER 90 tablet 1   sacubitril-valsartan (ENTRESTO) 49-51 MG Take 1 tablet by mouth 2 (two) times daily. 180 tablet 3   spironolactone (ALDACTONE) 25 MG tablet TAKE 1 TABLET DAILY 45 tablet 7   No facility-administered medications prior to visit.     Review of Systems:   Constitutional: No weight loss or gain, night sweats, fevers, chills, fatigue HEENT: No headaches, difficulty swallowing, tooth/dental problems, or sore throat. No sneezing, itching, ear ache, nasal congestion, or post nasal drip CV:  +occasional BLE edema (mostly in evenings). No chest pain, orthopnea, PND, anasarca, dizziness, palpitations, syncope Resp: +shortness of breath with exertion (improved); occasionally minimally productive cough (clear). No excess mucus or change in color of mucus. No hemoptysis. No wheezing.  No chest wall deformity GI:  No heartburn, indigestion, abdominal pain, nausea, vomiting, diarrhea, change in bowel habits, loss of appetite, bloody stools.  Skin: No rash, lesions, ulcerations MSK:  No joint pain or swelling.  No decreased range of motion.  No back pain. Neuro: No dizziness or lightheadedness.  Psych: No depression or anxiety. Mood stable.     Physical Exam:  BP 128/70 (BP Location: Right Arm, Cuff Size: Normal)   Pulse (!) 55   Temp 97.8 F (36.6 C) (Temporal)   Ht '5\' 10"'$  (1.778 m)   Wt 212 lb 9.6 oz (96.4 kg)   SpO2 96%   BMI 30.50 kg/m   GEN: Pleasant, interactive, well-appearing; obese; in no acute distress HEENT:  Normocephalic and atraumatic. PERRLA. Sclera white. Nasal turbinates pink, moist and patent bilaterally. No rhinorrhea present. Oropharynx pink and moist, without exudate or edema. No lesions, ulcerations, or postnasal drip.  NECK:  Supple w/ fair ROM. No JVD present. Normal carotid impulses w/o bruits. Thyroid symmetrical with no goiter or  nodules palpated. No lymphadenopathy.   CV: Bradycardic, regular rhythm, no m/r/g, no peripheral edema. Pulses intact, +2 bilaterally. No cyanosis, pallor or clubbing. PULMONARY:  Unlabored, regular breathing. Clear bilaterally A&P w/o wheezes/rales/rhonchi. No accessory muscle use. No dullness to percussion. GI:  BS present and normoactive. Soft, non-tender to palpation. No organomegaly or masses detected. No CVA tenderness. MSK: No erythema, warmth or tenderness. Cap refil <2 sec all extrem. No deformities or joint swelling noted.  Neuro: A/Ox3. No focal deficits noted.   Skin: Warm, no lesions or rashe Psych: Normal affect and behavior. Judgement and thought content appropriate.     Lab Results:  CBC    Component Value Date/Time   WBC 17.1 (H) 03/22/2021 0855   WBC 30.2 (H) 12/30/2018 0040   RBC 5.20 03/22/2021 0855   HGB 13.9 03/22/2021 0855   HGB 13.9 03/20/2021 1015   HGB 13.8 08/22/2016 0932   HCT 44.2 03/22/2021 0855   HCT 43.2 03/20/2021 1015   HCT 44.1 08/22/2016 0932   PLT 122 (L) 03/22/2021 0855   PLT 127 (L) 03/20/2021 1015   MCV 85.0 03/22/2021 0855   MCV 84 03/20/2021 1015   MCV 86.0 08/22/2016 0932   MCH 26.7 03/22/2021 0855   MCHC 31.4 03/22/2021 0855   RDW 14.6 03/22/2021 0855   RDW 13.4 03/20/2021 1015   RDW 14.3 08/22/2016 0932   LYMPHSABS 11.7 (H) 03/22/2021 0855   LYMPHSABS 11.4 (H) 03/20/2021 1015   LYMPHSABS 16.6 (H) 08/22/2016 0932   MONOABS 0.8 03/22/2021 0855   MONOABS 0.3 08/22/2016 0932   EOSABS 0.1 03/22/2021 0855   EOSABS 0.1 03/20/2021 1015   BASOSABS 0.0 03/22/2021 0855   BASOSABS 0.0 03/20/2021 1015   BASOSABS 0.0 08/22/2016 0932    BMET    Component Value Date/Time   NA 140 03/22/2021 0855   NA 141 03/20/2021 1015   NA 141 08/22/2016 0932   K 4.7 03/22/2021 0855   K 4.5 08/22/2016 0932   CL 106 03/22/2021 0855   CL 105 01/02/2012 0938   CO2 29 03/22/2021 0855   CO2 28 08/22/2016 0932   GLUCOSE 105 (H) 03/22/2021 0855    GLUCOSE 98 08/22/2016 0932   GLUCOSE 97 01/02/2012 0938   BUN 21 03/22/2021 0855   BUN 20 03/20/2021 1015   BUN 16.6 08/22/2016 0932   CREATININE 1.05 03/22/2021 0855   CREATININE 0.9 08/22/2016 0932   CALCIUM 10.0 03/22/2021 0855   CALCIUM 9.5 08/22/2016 0932   GFRNONAA >60 03/22/2021 0855   GFRAA >60 08/27/2019 0846    BNP    Component Value Date/Time   BNP 304.0 (H) 03/20/2021 1015   BNP 208.8 (H) 12/29/2018 0450     Imaging:  No results found.       Latest Ref Rng & Units 05/07/2021    9:40 AM 10/19/2019    8:47 AM 03/29/2019    1:49 PM  PFT Results  FVC-Pre L 3.82  3.98  3.31   FVC-Predicted Pre % 89  90  74   FVC-Post L 3.91  4.01  3.32   FVC-Predicted Post % 91  91  74   Pre FEV1/FVC % % 73  77  76   Post FEV1/FCV % % 75  77  78   FEV1-Pre L 2.77  3.07  2.52   FEV1-Predicted Pre % 89  96  77   FEV1-Post L 2.92  3.08  2.59   DLCO uncorrected ml/min/mmHg 13.80  14.26  14.25   DLCO UNC% % 54  55  54   DLCO corrected ml/min/mmHg 14.08  14.26  14.21   DLCO COR %Predicted % 55  55  54   DLVA Predicted % 61  62  69   TLC L 5.74  6.12  5.78   TLC % Predicted % 80  84  79   RV % Predicted % 88  84  85     No results found for: "NITRICOXIDE"      Assessment & Plan:   OSA (obstructive sleep apnea) Underwent CPAP titration in 04/2021 with poor control of OSA on CPAP so he was transitioned to BiPAP. Previous issues with large leaks seem to be mostly resolved. He has excellent compliance and control on current BiPAP settings. He is doing much better from a sleep/fatigue standpoint. Receiving good benefit from use. Happy with his progress today. Cautioned on safe driving practices.  Patient Instructions  Continue BiPAP nightly. Wear for a minimum of 4-6 hours a night.   -Continue Trelegy 1 puff daily. Brush tongue and rinse mouth afterwards.  -Continue Albuterol inhaler 2 puffs every 6 hours as needed for shortness of breath or wheezing. Notify if symptoms  persist despite rescue inhaler/neb use. -Continue Claritin 10 mg daily -Continue Flonase 2 sprays each nostril daily  -Continue Mucinex 600 mg Twice daily for chest congestion and nasal drainage   Follow up in April 2024 with repeat PFTs and then office visit with Dr. [name]. If symptoms worsen, please contact office for sooner follow up or seek emergency care.   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 HRCT from June stable. Previous walking oximetry without desaturations. Currently under surveillance. Plan to repeat PFTs in April 2024 for yearly follow up.   Centrilobular emphysema ([place]) Compensated on current regimen. He has transitioned to triple therapy regimen and is doing well on Trelegy. Encouraged to remain active/activity as tolerated.     I spent 28 minutes of dedicated to the care of this patient on the date of this encounter to include pre-visit review of records, face-to-face time with the patient discussing conditions above, post visit ordering of testing, clinical documentation with the electronic health record, making appropriate referrals as documented, and communicating necessary findings to members of the patients care team.  Clayton Bibles, NP 11/28/2021  Pt aware and understands NP's role.

## 2021-11-28 NOTE — Assessment & Plan Note (Addendum)
Underwent CPAP titration in 04/2021 with poor control of OSA on CPAP so he was transitioned to BiPAP. Previous issues with large leaks seem to be mostly resolved. He has excellent compliance and control on current BiPAP settings. He is doing much better from a sleep/fatigue standpoint. Receiving good benefit from use. Happy with his progress today. Cautioned on safe driving practices.  Patient Instructions  Continue BiPAP nightly. Wear for a minimum of 4-6 hours a night.   -Continue Trelegy 1 puff daily. Brush tongue and rinse mouth afterwards.  -Continue Albuterol inhaler 2 puffs every 6 hours as needed for shortness of breath or wheezing. Notify if symptoms persist despite rescue inhaler/neb use. -Continue Claritin 10 mg daily -Continue Flonase 2 sprays each nostril daily  -Continue Mucinex 600 mg Twice daily for chest congestion and nasal drainage   Follow up in April 2024 with repeat PFTs and then office visit with Dr. [name]. If symptoms worsen, please contact office for sooner follow up or seek emergency care.

## 2021-12-07 ENCOUNTER — Other Ambulatory Visit: Payer: Medicare Other

## 2021-12-12 ENCOUNTER — Ambulatory Visit
Admission: RE | Admit: 2021-12-12 | Discharge: 2021-12-12 | Disposition: A | Discharge status: Home / Self Care | Payer: Medicare Other | Source: Ambulatory Visit | Attending: Pulmonary Disease | Admitting: Pulmonary Disease

## 2021-12-12 DIAGNOSIS — J479 Bronchiectasis, uncomplicated: Secondary | ICD-10-CM | POA: Diagnosis not present

## 2021-12-12 DIAGNOSIS — J849 Interstitial pulmonary disease, unspecified: Secondary | ICD-10-CM

## 2021-12-12 DIAGNOSIS — J432 Centrilobular emphysema: Secondary | ICD-10-CM | POA: Diagnosis not present

## 2021-12-12 DIAGNOSIS — I700 Atherosclerosis of aorta: Secondary | ICD-10-CM | POA: Diagnosis not present

## 2021-12-12 DIAGNOSIS — J8410 Pulmonary fibrosis, unspecified: Secondary | ICD-10-CM | POA: Diagnosis not present

## 2021-12-25 NOTE — Progress Notes (Signed)
Cardiology Office Note:    Date:  12/28/2021   ID:  Rodney Burns, DOB 02-May-1945, MRN [id number]  PCP:  [name] Cardiologist:  Perris Tripathi [place], MD  [place] Electrophysiologist:  None   Referring MD: [name], MD   Chief Complaint  Patient presents with   Atrial Fibrillation   Congestive Heart Failure   Coronary Artery Disease    History of Present Illness:    Rodney Burns is a 76 y.o. male with a hx of atrial flutter, HTN, HLD, CAD s/p CABG 1994, pericarditis in January 8676, chronic systolic heart failure, OSA on CPAP, CLL and thoracic aortic aneurysm.  He had radiofrequency ablation in 2011 of atrial tachycardia/flutter however had recurrent atrial flutter after the ablation. He had repeat ablation of another atrial tachy/flutter in 2013 by Dr Rayann Heman.   Myoview in March 2016 showed inferior and anteroapical infarct without ischemia, EF 32%.  Echocardiogram obtained in January 2020 due to pericarditis showed EF 40 to 45%, hypokinesis of the anteroseptal and inferoseptal myocardium, trivial AI, mild MR.  Repeat echocardiogram in October 2020 showed EF 40 to 45%, hypokinesis of the anteroseptal and inferoseptal myocardium, grade 1 DD, mild MR, 40 mm aortic root dilatation.    He was tested positive for COVID-19 in December 2020 and has had a significant acute hypoxic respiratory failure.  He also had a significant transaminitis and was unable to get remdesivir or Actemra.  Toprol-XL was increased and he was given IV digoxin.  CTA January 2021 showed pulmonary fibrosis.  He has been able to wean off of oxygen.  He also completed pulmonary rehab.  Repeat echocardiogram obtained in March 2021 showed EF 35 to 40%, global hypokinesis, normal pulmonary artery systolic pressure, trivial AI, mild dilatation of the aortic root measuring at 54m.  Spironolactone was added to his medical regimen at the time.  Note, he was in atrial flutter during  the echocardiogram even though EKG prior to that in December 2020 showed a sinus rhythm. When seen in October 2021, EKG showed he was back in sinus rhythm.  He was also having symptomatic orthostatic hypotension at the time and bradycardia, spironolactone was decreased to 12.5 mg daily, Toprol-XL decreased to 50 mg daily. When seen last year digoxin was discontinued. He was felt to be in NSR at that time but no Ecg done.  He was seen by me in February 2023, at which time he complained of dizziness and lightheadedness.  He gained about 8 pounds.  proBNP was elevated at 638.  Spironolactone was increased to 25 mg daily.  Lasix 20 mg daily was added to his medical regimen.  EKG showed the patient was in controlled atrial fibrillation which may have contributed to his symptoms.  Outpatient cardioversion was scheduled for 3/16 however later canceled as patient arrived in sinus rhythm.  Repeat echocardiogram obtained on 03/22/2021 demonstrated EF 35%, global hypokinesis, mild MR, dilated aortic root at 40 mm.  When see in follow up he was bradycardic so a heart monitor was placed. This showed recurrent Afib with slow rates at times so beta blocker was reduced. Consideration for AAD limited to Tikosyn due to co-morbidities.   He states he is doing well. Had recent  follow up CT chest for his pulmonary fibrosis. No change from June. Dyspnea really hasn't changed. Still gets SOB with activity. Not aware of Afib at all. No edema. Weight is stable. Has a new CPAP and reports  he is sleeping better 5-6 hours a night. Had dental extraction this week.     Past Medical History:  Diagnosis Date   Atrial tachycardia    a. s/p DCCV 09/2009; b. s/p RFCA 03/2011; c. s/p DCCV 04/2011; d. s/p RFCA 09/17/11.   Cataract    Chronic systolic CHF (congestive heart failure) ([place])    a. 01/2015 Echo: EF 40-45%, antsept/infsept HK, triv AI, mild MR, mod dil LA, nl RV, mildly dil RA.   CLL (chronic lymphoblastic leukemia)    Stage 0-1    Coronary artery disease involving native coronary artery without angina pectoris    a. Status post CABG in 1994:By Dr. Cyndia Bent. LIMA - L Cx, seqSVG-Diag-LAD, and SVG-PDA-PL.   Cough    Consistant with ACE inhibitor mediated cough   Dysrhythmia    Glaucoma (increased eye pressure) 1991   History of tobacco abuse    quit 1994   Hypercholesterolemia    Well Controlled   Hypertension    Ischemic cardiomyopathy    a. 02/2011 Echo: EF 40-45%;  b. 01/2015 Echo: EF 40-45%.   Malaria 1972   Hx of   Pericarditis    a. 01/2015-->Treated w/ ibuprofen/colchicine.   Sleep-disordered breathing 06/20/2011    Past Surgical History:  Procedure Laterality Date   ATRIAL FLUTTER ABLATION N/A 04/11/2011   Procedure: ATRIAL FLUTTER ABLATION;  Surgeon: Deboraha Sprang, MD;  Location: [facility] CATH LAB;  Service: Cardiovascular;  Laterality: N/A;   ATRIAL FLUTTER ABLATION N/A 09/17/2011   Procedure: ATRIAL FLUTTER ABLATION;  Surgeon: Thompson Grayer, MD;  Location[facility] CATH LAB;  Service: Cardiovascular;  Laterality: N/A;   CARDIOVERSION  06/03/2011   Procedure: CARDIOVERSION;  Surgeon: Deboraha Sprang, MD;  Location: [place];  Service: Cardiovascular;  Laterality: N/A;   CATARACT EXTRACTION     COLONOSCOPY WITH PROPOFOL N/A 09/26/2021   Procedure: COLONOSCOPY WITH PROPOFOL;  Surgeon: Arta Silence, MD;  Location: [facility];  Service: Gastroenterology;  Laterality: N/A;   CORONARY ARTERY BYPASS GRAFT  1994   By Dr. Cyndia Bent. LIMA - L Cx, seqSVG-Diag-LAD, and SVG-PDA-PL   ELECTROPHYSIOLOGY STUDY N/A 04/11/2011   Procedure: ELECTROPHYSIOLOGY STUDY;  Surgeon: Deboraha Sprang, MD;  Location: [facility] CATH LAB;  Service: Cardiovascular;  Laterality: N/A;   EP study and ablation  2013   by Dr Caryl Comes, repeat ablation by Dr Rayann Heman   NM MYOVIEW LTD  03/2014   scar in the inferior and anteroapical regions without ischemia. This is similar to finding on Myoview in 2013. EF is a little lower 39%>>32%.   TRIGGER FINGER RELEASE  12/2016   left  hand    US ECHOCARDIOGRAPHY  09-07-09; 02/2011   a. Est EF 40-45%; b. EF 40-45%. Gr 2 DD. Mild LA dil    Current Medications: Current Meds  Medication Sig   docusate sodium (COLACE) 100 MG capsule Take 100 mg by mouth 2 (two) times daily.   ezetimibe-simvastatin (VYTORIN) 10-20 MG tablet TAKE 1 TABLET AT BEDTIME (KEEP UPCOMING APPOINTMENT FOR REFILLS)   Fiber, Guar Gum, CHEW Chew 3 each by mouth daily.   fluticasone (FLONASE) 50 MCG/ACT nasal spray Place 2 sprays into both nostrils daily. (Patient taking differently: Place 2 sprays into both nostrils daily as needed for rhinitis.)   furosemide (LASIX) 20 MG tablet Take 1 tablet (20 mg total) by mouth daily.   latanoprost (XALATAN) 0.005 % ophthalmic solution Place 1 drop into both eyes at bedtime.   loratadine (CLARITIN) 10 MG tablet Take 1 tablet (10  mg total) by mouth daily.   Melatonin 5 MG CHEW Chew 5 mg by mouth at bedtime.   metoprolol succinate (TOPROL-XL) 50 MG 24 hr tablet TAKE 1 TABLET IN THE MORNING AND ONE-HALF (1/2) TABLET IN THE EVENING (Patient taking differently: Take 25 mg by mouth 2 (two) times daily.)   pramipexole (MIRAPEX) 1.5 MG tablet Take 1.5 mg by mouth at bedtime.   RABEprazole (ACIPHEX) 20 MG tablet Take 1 tablet (20 mg total) by mouth daily.   rivaroxaban (XARELTO) 20 MG TABS tablet TAKE 1 TABLET DAILY WITH SUPPER   sacubitril-valsartan (ENTRESTO) 49-51 MG Take 1 tablet by mouth 2 (two) times daily.   spironolactone (ALDACTONE) 25 MG tablet TAKE 1 TABLET DAILY   [DISCONTINUED] Fluticasone-Umeclidin-Vilant (TRELEGY ELLIPTA) 100-62.5-25 MCG/ACT AEPB Inhale 1 puff into the lungs daily.     Allergies:   Penicillins, Ace inhibitors, Darvocet [propoxyphene n-acetaminophen], and Sulfa antibiotics   Social History   Socioeconomic History   Marital status: Married    Spouse name: Not on file   Number of children: 2   Years of education: Not on file   Highest education level: Not on file  Occupational History    Occupation: Tree surgeon    Employer: RETIRED  Tobacco Use   Smoking status: Former    Packs/day: 2.00    Years: 30.00    Total pack years: 60.00    Types: Cigarettes    Quit date: 01/22/1992    Years since quitting: 29.9   Smokeless tobacco: Never  Vaping Use   Vaping Use: Never used  Substance and Sexual Activity   Alcohol use: No   Drug use: No   Sexual activity: Yes  Other Topics Concern   Not on file  Social History Narrative   Not on file   Social Determinants of Health   Financial Resource Strain: Not on file  Food Insecurity: Not on file  Transportation Needs: Not on file  Physical Activity: Not on file  Stress: Not on file  Social Connections: Not on file     Family History: The patient's family history includes Cancer in his brother; Down syndrome in his son; Heart failure in his father.  ROS:   Please see the history of present illness.     All other systems reviewed and are negative.  EKGs/Labs/Other Studies Reviewed:    The following studies were reviewed today:  Echo 04/14/2019 1. Left ventricular ejection fraction, by estimation, is 35 to 40%. The  left ventricle has moderately decreased function. The left ventricle  demonstrates global hypokinesis. Left ventricular diastolic parameters are  indeterminate.   2. Right ventricular systolic function is mildly reduced. The right  ventricular size is normal. There is normal pulmonary artery systolic  pressure. The estimated right ventricular systolic pressure is 76.1 mmHg.   3. The mitral valve is normal in structure. Trivial mitral valve  regurgitation. No evidence of mitral stenosis.   4. The aortic valve is tricuspid. Aortic valve regurgitation is trivial.  Mild aortic valve sclerosis is present, with no evidence of aortic valve  stenosis.   5. Aortic dilatation noted. There is mild dilatation of the aortic root  measuring 37 mm.   6. The inferior vena cava is normal in size with greater than 50%   respiratory variability, suggesting right atrial pressure of 3 mmHg.   7. The patient was in atrial flutter.   Echo 03/22/21: IMPRESSIONS Left ventricular ejection fraction, by estimation, is 35%. The left ventricle has moderately decreased  function. The left ventricle demonstrates global hypokinesis. The left ventricular internal cavity size was mildly dilated. Left ventricular diastolic parameters are indeterminate. 1. Right ventricular systolic function is normal. The right ventricular size is mildly enlarged. Tricuspid regurgitation signal is inadequate for assessing PA pressure. 2. 3. Left atrial size was mildly dilated. 4. Right atrial size was mildly dilated. The mitral valve is normal in structure. Mild mitral valve regurgitation. No evidence of mitral stenosis. 5. The aortic valve is tricuspid. There is mild calcification of the aortic valve. Aortic valve regurgitation is trivial. Aortic valve sclerosis/calcification is present, without any evidence of aortic stenosis. 6. 7. Aortic dilatation noted. There is mild dilatation of the aortic root, measuring 40 mm. The inferior vena cava is dilated in size with >50% respiratory variability, suggesting right atrial pressure of 8 mmHg. 8. 9. The patient was in atrial fibrillation.  EKG:  EKG is not ordered today.    Recent Labs: 03/09/2021: Pro B Natriuretic peptide (BNP) 638.0 03/20/2021: BNP 304.0; TSH 1.780 03/22/2021: ALT 19; BUN 21; Creatinine 1.05; Hemoglobin 13.9; Platelet Count 122; Potassium 4.7; Sodium 140  Recent Lipid Panel    Component Value Date/Time   CHOL 108 10/16/2017 1435   TRIG 93 12/24/2018 1134   HDL 42 10/16/2017 1435   LDLCALC 55 10/16/2017 1435   Dated 10/23/20: cholesterol 111, triglycerides 46, HDL 45, LDL 55. Dated 05/30/21: A1c 6.3%. cholesterol 105, triglycerides 58, HDL 38, LDL 54. CMET and CBC normal.  Risk Assessment/Calculations:       Physical Exam:    VS:  BP (!) 100/58 (BP Location:  Left Arm, Patient Position: Sitting, Cuff Size: Normal)   Pulse 82   Ht '5\' 10"'$  (1.778 m)   Wt 213 lb 3.2 oz (96.7 kg)   SpO2 96%   BMI 30.59 kg/m     Wt Readings from Last 3 Encounters:  12/28/21 213 lb 3.2 oz (96.7 kg)  11/28/21 212 lb 9.6 oz (96.4 kg)  11/14/21 210 lb (95.3 kg)     GEN:  Well nourished, well developed in no acute distress HEENT: Normal NECK: No JVD; No carotid bruits LYMPHATICS: No lymphadenopathy CARDIAC: IRRR, no murmurs, rubs, gallops RESPIRATORY:  Clear to auscultation without rales, wheezing or rhonchi  ABDOMEN: Soft, non-tender, non-distended MUSCULOSKELETAL:  No edema; No deformity  SKIN: Warm and dry NEUROLOGIC:  Alert and oriented x 3 PSYCHIATRIC:  Normal affect   ASSESSMENT:    1. Persistent atrial fibrillation ([place])   2. Coronary artery disease involving coronary bypass graft of native heart without angina pectoris   3. Chronic systolic CHF (congestive heart failure) ([place])   4. Hx of CABG x 5 '94   5. Thoracic aortic aneurysm without rupture, unspecified part ([place])   6. Hyperlipidemia LDL goal <70       PLAN:    In order of problems listed above:  Atrial fibrillation with controlled response. On reduced beta blocker dose. On Xarelto. We discussed further attempts at restoring NSR. Tikosyn would be the only AAD option. He is really asymptomatic so will treat with rate control strategy.    Chronic combined systolic/diastolic CHF. EF 35%. On Entresto and Toprol. Unable to titrate with low BP.  Continue lasix 20 mg daily and low dose aldactone. Appears euvolemic today.   CAD s/p remote CABG in 1994. No active angina.   Hyperlipidemia. On statin LDL at goal 54.   Remote atrial flutter/tachycardia s/p ablation x 2.   6.   HTN controlled.  Follow up in 6 months.    Medication Adjustments/Labs and Tests Ordered: Current medicines are reviewed at length with the patient today.  Concerns regarding medicines are outlined above.  No  orders of the defined types were placed in this encounter.  No orders of the defined types were placed in this encounter.   There are no Patient Instructions on file for this visit.   Signed, Tiziana Cislo [place], MD  12/28/2021 9:33 AM    [facility]

## 2021-12-28 ENCOUNTER — Encounter: Payer: Self-pay | Admitting: Cardiology

## 2021-12-28 ENCOUNTER — Ambulatory Visit: Payer: Medicare Other | Attending: Cardiology | Admitting: Cardiology

## 2021-12-28 VITALS — BP 100/58 | HR 82 | Ht 70.0 in | Wt 213.2 lb

## 2021-12-28 DIAGNOSIS — I712 Thoracic aortic aneurysm, without rupture, unspecified: Secondary | ICD-10-CM | POA: Diagnosis not present

## 2021-12-28 DIAGNOSIS — E785 Hyperlipidemia, unspecified: Secondary | ICD-10-CM | POA: Diagnosis not present

## 2021-12-28 DIAGNOSIS — I4819 Other persistent atrial fibrillation: Secondary | ICD-10-CM | POA: Diagnosis not present

## 2021-12-28 DIAGNOSIS — I25810 Atherosclerosis of coronary artery bypass graft(s) without angina pectoris: Secondary | ICD-10-CM | POA: Diagnosis not present

## 2021-12-28 DIAGNOSIS — I5022 Chronic systolic (congestive) heart failure: Secondary | ICD-10-CM | POA: Diagnosis not present

## 2021-12-28 DIAGNOSIS — Z951 Presence of aortocoronary bypass graft: Secondary | ICD-10-CM | POA: Insufficient documentation

## 2021-12-28 NOTE — Patient Instructions (Signed)
Medication Instructions:  The current medical regimen is effective;  continue present plan and medications.  *If you need a refill on your cardiac medications before your next appointment, please call your pharmacy*   Follow-Up: At [facility], you and your health needs are our priority.  As part of our continuing mission to provide you with exceptional heart care, we have created designated Provider Care Teams.  These Care Teams include your primary Cardiologist (physician) and Advanced Practice Providers (APPs -  Physician Assistants and Nurse Practitioners) who all work together to provide you with the care you need, when you need it.  We recommend signing up for the patient portal called "MyChart".  Sign up information is provided on this After Visit Summary.  MyChart is used to connect with patients for Virtual Visits (Telemedicine).  Patients are able to view lab/test results, encounter notes, upcoming appointments, etc.  Non-urgent messages can be sent to your provider as well.   To learn more about what you can do with MyChart, go to [email or web address].    Your next appointment:   6 month(s)  The format for your next appointment:   In Person  Provider:   Peter Jordan, MD         

## 2022-01-11 ENCOUNTER — Other Ambulatory Visit: Payer: Self-pay | Admitting: *Deleted

## 2022-01-11 DIAGNOSIS — J849 Interstitial pulmonary disease, unspecified: Secondary | ICD-10-CM

## 2022-01-24 DIAGNOSIS — L820 Inflamed seborrheic keratosis: Secondary | ICD-10-CM | POA: Diagnosis not present

## 2022-02-11 ENCOUNTER — Other Ambulatory Visit: Payer: Self-pay | Admitting: Cardiology

## 2022-02-18 ENCOUNTER — Other Ambulatory Visit: Payer: Self-pay | Admitting: Cardiology

## 2022-03-06 DIAGNOSIS — E1169 Type 2 diabetes mellitus with other specified complication: Secondary | ICD-10-CM | POA: Diagnosis not present

## 2022-03-06 DIAGNOSIS — J432 Centrilobular emphysema: Secondary | ICD-10-CM | POA: Diagnosis not present

## 2022-03-06 DIAGNOSIS — H35033 Hypertensive retinopathy, bilateral: Secondary | ICD-10-CM | POA: Diagnosis not present

## 2022-03-06 DIAGNOSIS — J849 Interstitial pulmonary disease, unspecified: Secondary | ICD-10-CM | POA: Diagnosis not present

## 2022-03-06 DIAGNOSIS — I712 Thoracic aortic aneurysm, without rupture, unspecified: Secondary | ICD-10-CM | POA: Diagnosis not present

## 2022-03-06 DIAGNOSIS — I10 Essential (primary) hypertension: Secondary | ICD-10-CM | POA: Diagnosis not present

## 2022-03-06 DIAGNOSIS — E785 Hyperlipidemia, unspecified: Secondary | ICD-10-CM | POA: Diagnosis not present

## 2022-03-06 DIAGNOSIS — I4819 Other persistent atrial fibrillation: Secondary | ICD-10-CM | POA: Diagnosis not present

## 2022-03-06 DIAGNOSIS — C9110 Chronic lymphocytic leukemia of B-cell type not having achieved remission: Secondary | ICD-10-CM | POA: Diagnosis not present

## 2022-03-06 DIAGNOSIS — D696 Thrombocytopenia, unspecified: Secondary | ICD-10-CM | POA: Diagnosis not present

## 2022-03-06 DIAGNOSIS — I2510 Atherosclerotic heart disease of native coronary artery without angina pectoris: Secondary | ICD-10-CM | POA: Diagnosis not present

## 2022-03-06 DIAGNOSIS — D6869 Other thrombophilia: Secondary | ICD-10-CM | POA: Diagnosis not present

## 2022-03-12 ENCOUNTER — Encounter: Payer: Self-pay | Admitting: Hematology and Oncology

## 2022-03-21 NOTE — Progress Notes (Signed)
Patient Care Team: [name] @ [place] as PCP - General (Family Medicine) Martinique, [name], MD as PCP - Cardiology (Cardiology)  DIAGNOSIS: No diagnosis found.  SUMMARY OF ONCOLOGIC HISTORY: Oncology History   No history exists.    CHIEF COMPLIANT:  Follow-up of CLL   INTERVAL HISTORY: Rodney PERRO is a 77 y.o. with above-mentioned history of CLL. He reports to the clinic today for follow-up.    ALLERGIES:  is allergic to penicillins, ace inhibitors, darvocet [propoxyphene n-acetaminophen], and sulfa antibiotics.  MEDICATIONS:  Current Outpatient Medications  Medication Sig Dispense Refill   docusate sodium (COLACE) 100 MG capsule Take 100 mg by mouth 2 (two) times daily.     ezetimibe-simvastatin (VYTORIN) 10-20 MG tablet TAKE 1 TABLET AT BEDTIME (KEEP UPCOMING APPOINTMENT FOR REFILLS) 90 tablet 3   Fiber, Guar Gum, CHEW Chew 3 each by mouth daily.     fluticasone (FLONASE) 50 MCG/ACT nasal spray Place 2 sprays into both nostrils daily. (Patient taking differently: Place 2 sprays into both nostrils daily as needed for rhinitis.) 18.2 mL 3   furosemide (LASIX) 20 MG tablet Take 1 tablet (20 mg total) by mouth daily. 90 tablet 3   latanoprost (XALATAN) 0.005 % ophthalmic solution Place 1 drop into both eyes at bedtime.     loratadine (CLARITIN) 10 MG tablet Take 1 tablet (10 mg total) by mouth daily. 90 tablet 3   Melatonin 5 MG CHEW Chew 5 mg by mouth at bedtime.     metoprolol succinate (TOPROL-XL) 50 MG 24 hr tablet TAKE 1 TABLET IN THE MORNING AND ONE-HALF (1/2) TABLET IN THE EVENING 135 tablet 3   pramipexole (MIRAPEX) 1.5 MG tablet Take 1.5 mg by mouth at bedtime.     RABEprazole (ACIPHEX) 20 MG tablet Take 1 tablet (20 mg total) by mouth daily. 90 tablet 3   rivaroxaban (XARELTO) 20 MG TABS tablet TAKE 1 TABLET DAILY WITH SUPPER 90 tablet 1   sacubitril-valsartan (ENTRESTO) 49-51 MG Take 1 tablet by mouth 2 (two) times daily. 180 tablet 3    spironolactone (ALDACTONE) 25 MG tablet TAKE 1 TABLET DAILY 45 tablet 7   No current facility-administered medications for this visit.    PHYSICAL EXAMINATION: ECOG PERFORMANCE STATUS: {CHL ONC ECOG PS:[id number]}  There were no vitals filed for this visit. There were no vitals filed for this visit.  BREAST:*** No palpable masses or nodules in either right or left breasts. No palpable axillary supraclavicular or infraclavicular adenopathy no breast tenderness or nipple discharge. (exam performed in the presence of a chaperone)  LABORATORY DATA:  I have reviewed the data as listed    Latest Ref Rng & Units 03/22/2021    8:55 AM 03/20/2021   10:15 AM 03/09/2021   11:04 AM  CMP  Glucose 70 - 99 mg/dL 105  100  106   BUN 8 - 23 mg/dL '21  20  15   '$ Creatinine 0.61 - 1.24 mg/dL 1.05  0.97  0.93   Sodium 135 - 145 mmol/L 140  141  139   Potassium 3.5 - 5.1 mmol/L 4.7  4.6  4.4   Chloride 98 - 111 mmol/L 106  104  106   CO2 22 - 32 mmol/L '29  24  30   '$ Calcium 8.9 - 10.3 mg/dL 10.0  9.6  9.3   Total Protein 6.5 - 8.1 g/dL 7.0     Total Bilirubin 0.3 - 1.2 mg/dL 1.2     Alkaline Phos  38 - 126 U/L 36     AST 15 - 41 U/L 18     ALT 0 - 44 U/L 19       Lab Results  Component Value Date   WBC 17.1 (H) 03/22/2021   HGB 13.9 03/22/2021   HCT 44.2 03/22/2021   MCV 85.0 03/22/2021   PLT 122 (L) 03/22/2021   NEUTROABS 4.4 03/22/2021    ASSESSMENT & PLAN:  No problem-specific Assessment & Plan notes found for this encounter.    No orders of the defined types were placed in this encounter.  The patient has a good understanding of the overall plan. he agrees with it. he will call with any problems that may develop before the next visit here. Total time spent: 30 mins including face to face time and time spent for planning, charting and co-ordination of care   Suzzette Righter, [place] 03/21/22    I Gardiner Coins am acting as a Education administrator for [employer]  ***

## 2022-03-26 ENCOUNTER — Other Ambulatory Visit: Payer: Self-pay

## 2022-03-26 ENCOUNTER — Inpatient Hospital Stay (HOSPITAL_BASED_OUTPATIENT_CLINIC_OR_DEPARTMENT_OTHER): Payer: Medicare Other | Admitting: Hematology and Oncology

## 2022-03-26 ENCOUNTER — Inpatient Hospital Stay: Payer: Medicare Other | Attending: Hematology and Oncology

## 2022-03-26 VITALS — BP 107/61 | HR 64 | Temp 97.5°F | Resp 18 | Ht 70.0 in | Wt 213.4 lb

## 2022-03-26 DIAGNOSIS — Z7901 Long term (current) use of anticoagulants: Secondary | ICD-10-CM | POA: Insufficient documentation

## 2022-03-26 DIAGNOSIS — R5383 Other fatigue: Secondary | ICD-10-CM | POA: Diagnosis not present

## 2022-03-26 DIAGNOSIS — I4891 Unspecified atrial fibrillation: Secondary | ICD-10-CM | POA: Diagnosis not present

## 2022-03-26 DIAGNOSIS — Z79899 Other long term (current) drug therapy: Secondary | ICD-10-CM | POA: Diagnosis not present

## 2022-03-26 DIAGNOSIS — C9110 Chronic lymphocytic leukemia of B-cell type not having achieved remission: Secondary | ICD-10-CM

## 2022-03-26 LAB — CMP (CANCER CENTER ONLY)
ALT: 14 U/L (ref 0–44)
AST: 15 U/L (ref 15–41)
Albumin: 4.6 g/dL (ref 3.5–5.0)
Alkaline Phosphatase: 34 U/L — ABNORMAL LOW (ref 38–126)
Anion gap: 7 (ref 5–15)
BUN: 19 mg/dL (ref 8–23)
CO2: 28 mmol/L (ref 22–32)
Calcium: 9.4 mg/dL (ref 8.9–10.3)
Chloride: 106 mmol/L (ref 98–111)
Creatinine: 1.05 mg/dL (ref 0.61–1.24)
GFR, Estimated: >60 mL/min (ref >60)
Glucose, Bld: 107 mg/dL — ABNORMAL HIGH (ref 70–99)
Potassium: 4.4 mmol/L (ref 3.5–5.1)
Sodium: 141 mmol/L (ref 135–145)
Total Bilirubin: 1.1 mg/dL (ref 0.3–1.2)
Total Protein: 6.8 g/dL (ref 6.5–8.1)

## 2022-03-26 LAB — CBC WITH DIFFERENTIAL (CANCER CENTER ONLY)
Abs Immature Granulocytes: 0 10*3/uL (ref 0.00–0.07)
Basophils Absolute: 0 10*3/uL (ref 0.0–0.1)
Basophils Relative: 0 %
Eosinophils Absolute: 0.5 10*3/uL (ref 0.0–0.5)
Eosinophils Relative: 3 %
HCT: 45.4 % (ref 39.0–52.0)
Hemoglobin: 14.6 g/dL (ref 13.0–17.0)
Lymphocytes Relative: 52 %
Lymphs Abs: 9.4 10*3/uL — ABNORMAL HIGH (ref 0.7–4.0)
MCH: 28.5 pg (ref 26.0–34.0)
MCHC: 32.2 g/dL (ref 30.0–36.0)
MCV: 88.5 fL (ref 80.0–100.0)
Monocytes Absolute: 1.4 10*3/uL — ABNORMAL HIGH (ref 0.1–1.0)
Monocytes Relative: 8 %
Neutro Abs: 6.7 10*3/uL (ref 1.7–7.7)
Neutrophils Relative %: 37 %
Platelet Count: 104 10*3/uL — ABNORMAL LOW (ref 150–400)
RBC: 5.13 MIL/uL (ref 4.22–5.81)
RDW: 14.2 % (ref 11.5–15.5)
WBC Count: 18.1 10*3/uL — ABNORMAL HIGH (ref 4.0–10.5)
nRBC: 0 % (ref 0.0–0.2)

## 2022-03-26 LAB — LACTATE DEHYDROGENASE: LDH: 134 U/L (ref 98–192)

## 2022-03-26 NOTE — Assessment & Plan Note (Signed)
CLL stage I, lymphocytosis and lymphadenopathy diagnosed 07/31/2009, initial CT scan revealed lymphadenopathy and mildly enlarged spleen.   Lab review:  Patient had COVID-19 pneumonia: Since that infection his lymphocyte count had come down to the   Treatment plan: Observation. Lab review:  03/06/2022: WBC 18, hemoglobin 14.5, platelets 116, ALC 12.8  I discussed with him the indications for treatment would be rapid doubling time on development of severe anemia thrombocytopenia, rapidly enlarging lymphadenopathy, B symptoms like fevers chills night sweats and weight loss.   Chronic thrombocytopenia: Watchful monitoring. He has atrial fibrillation and is planning to undergo cardioversion. He was diagnosed with congestive heart failure and is now on diuretics.  He sees Dr. Martinique with cardiology.   Return to clinic in 1 year with labs and follow-up

## 2022-03-28 ENCOUNTER — Other Ambulatory Visit: Payer: Self-pay | Admitting: Nurse Practitioner

## 2022-03-28 DIAGNOSIS — J310 Chronic rhinitis: Secondary | ICD-10-CM

## 2022-04-15 DIAGNOSIS — H40013 Open angle with borderline findings, low risk, bilateral: Secondary | ICD-10-CM | POA: Diagnosis not present

## 2022-05-24 DIAGNOSIS — M545 Low back pain, unspecified: Secondary | ICD-10-CM | POA: Diagnosis not present

## 2022-05-31 ENCOUNTER — Encounter: Payer: Self-pay | Admitting: Pulmonary Disease

## 2022-05-31 ENCOUNTER — Ambulatory Visit (INDEPENDENT_AMBULATORY_CARE_PROVIDER_SITE_OTHER): Payer: Medicare Other | Admitting: Pulmonary Disease

## 2022-05-31 VITALS — BP 100/60 | HR 55 | Temp 97.9°F | Ht 70.5 in | Wt 212.5 lb

## 2022-05-31 DIAGNOSIS — J849 Interstitial pulmonary disease, unspecified: Secondary | ICD-10-CM | POA: Diagnosis not present

## 2022-05-31 DIAGNOSIS — G4733 Obstructive sleep apnea (adult) (pediatric): Secondary | ICD-10-CM

## 2022-05-31 LAB — PULMONARY FUNCTION TEST
DL/VA % pred: 68 %
DL/VA: 2.67 ml/min/mmHg/L
DLCO cor % pred: 62 %
DLCO cor: 15.75 ml/min/mmHg
DLCO unc % pred: 62 %
DLCO unc: 15.75 ml/min/mmHg
FEF 25-75 Post: 2.1 L/sec
FEF 25-75 Pre: 2.05 L/sec
FEF2575-%Change-Post: 2 %
FEF2575-%Pred-Post: 96 %
FEF2575-%Pred-Pre: 94 %
FEV1-%Change-Post: 1 %
FEV1-%Pred-Post: 87 %
FEV1-%Pred-Pre: 86 %
FEV1-Post: 2.68 L
FEV1-Pre: 2.65 L
FEV1FVC-%Change-Post: 3 %
FEV1FVC-%Pred-Pre: 101 %
FEV6-%Change-Post: -2 %
FEV6-%Pred-Post: 87 %
FEV6-%Pred-Pre: 90 %
FEV6-Post: 3.49 L
FEV6-Pre: 3.58 L
FEV6FVC-%Change-Post: 0 %
FEV6FVC-%Pred-Post: 106 %
FEV6FVC-%Pred-Pre: 106 %
FVC-%Change-Post: -2 %
FVC-%Pred-Post: 82 %
FVC-%Pred-Pre: 85 %
FVC-Post: 3.51 L
FVC-Pre: 3.6 L
Post FEV1/FVC ratio: 76 %
Post FEV6/FVC ratio: 100 %
Pre FEV1/FVC ratio: 74 %
Pre FEV6/FVC Ratio: 99 %
RV % pred: 112 %
RV: 2.94 L
TLC % pred: 80 %
TLC: 5.73 L

## 2022-05-31 NOTE — Patient Instructions (Addendum)
Will walker I am glad you are doing well with your breathing Continue the inhaler and BiPAP Follow-up in 1 year

## 2022-05-31 NOTE — Patient Instructions (Signed)
Full PFT performed today. °

## 2022-05-31 NOTE — Progress Notes (Signed)
Rodney Burns    [id number]    1946-01-09  Primary Care Physician:Welborn, Alycia Rossetti, DO  Referring Physician: [facility] @ 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 male with PMHx of CLL, chronic atrial fibrillation-status post ablation-on Xarelto, history of CAD-s/p CABG 1994, chronic systolic heart failure, HTN, HLD.    Specialized from 12/3 to 12/30/2018 with COVID-19 pneumonia.  He was treated with steroids and convalescent plasma.  He did not get remdesivir or Actemra due to severe transaminitis.  Discharged on 2 L oxygen Continues to have significant dyspnea on exertion.  Follow-up chest x-ray 12/28 with worsening bilateral infiltrates and was referred to pulmonary for further evaluation.  He has history of CLL, currently on monitoring under the care of Dr. Pamelia Hoit  Pets: No pets, no birds Occupation: Retired Medical illustrator for [employer] Exposures: No known exposures.  No mold, hot tub, Jacuzzi.  No feather pillows or comforters  Smoking history: 60-pack-year smoker.  Quit in 1994 Travel history: No significant recent travel history Relevant family history: No significant family history of lung disease  Interim history: He had a titration sleep study done and switched to BiPAP Anoro changed to Trelegy with improvement in dyspnea No new complaints today  Outpatient Encounter Medications as of 05/31/2022  Medication Sig   docusate sodium (COLACE) 100 MG capsule Take 100 mg by mouth 2 (two) times daily.   ezetimibe-simvastatin (VYTORIN) 10-20 MG tablet TAKE 1 TABLET AT BEDTIME (KEEP UPCOMING APPOINTMENT FOR REFILLS)   Fiber, Guar Gum, CHEW Chew 3 each by mouth daily.   fluticasone (FLONASE) 50 MCG/ACT nasal spray SHAKE LIQUID AND USE 2 SPRAYS IN EACH NOSTRIL DAILY   Fluticasone-Umeclidin-Vilant (TRELEGY ELLIPTA) 100-62.5-25 MCG/ACT AEPB Inhale 100 1e11  Vector Genomes into the lungs. Use 1 inhalation daily   latanoprost (XALATAN) 0.005 % ophthalmic solution Place 1 drop into both eyes at bedtime.   loratadine (CLARITIN) 10 MG tablet Take 1 tablet (10 mg total) by mouth daily.   Melatonin 5 MG CHEW Chew 5 mg by mouth at bedtime.   metoprolol succinate (TOPROL-XL) 50 MG 24 hr tablet TAKE 1 TABLET IN THE MORNING AND ONE-HALF (1/2) TABLET IN THE EVENING   pramipexole (MIRAPEX) 1.5 MG tablet Take 1.5 mg by mouth at bedtime.   RABEprazole (ACIPHEX) 20 MG tablet Take 1 tablet (20 mg total) by mouth daily.   rivaroxaban (XARELTO) 20 MG TABS tablet TAKE 1 TABLET DAILY WITH SUPPER   sacubitril-valsartan (ENTRESTO) 49-51 MG Take 1 tablet by mouth 2 (two) times daily.   spironolactone (ALDACTONE) 25 MG tablet TAKE 1 TABLET DAILY   furosemide (LASIX) 20 MG tablet Take 1 tablet (20 mg total) by mouth daily.   No facility-administered encounter medications on file as of 05/31/2022.   Physical Exam: Blood pressure 100/60, pulse (!) 55, temperature 97.9 F (36.6 C), temperature source Oral, height 5' 10.5" (1.791 m), weight 212 lb 8 oz (96.4 kg), SpO2 96 %. Gen:      No acute distress HEENT:  EOMI, sclera anicteric Neck:     No masses; no thyromegaly Lungs:    Clear to auscultation bilaterally; normal respiratory effort CV:         Regular rate and rhythm; no murmurs Abd:      + bowel sounds; soft, non-tender; no palpable masses, no distension Ext:    No edema; adequate peripheral  perfusion Skin:      Warm and dry; no rash Neuro: alert and oriented x 3 Psych: normal mood and affect   Data Reviewed: Imaging: Chest x-ray 12/26/2018-bilateral interstitial prominence. Chest x-ray 01/18/2019-progression of reticulonodular opacities. High-resolution CT 02/03/2019-moderate pulmonary fibrosis with basal pattern with septal thickening, mild traction bronchiectasis.  No honeycombing.  Probable UIP High-res CT 10/26/2019-stable pulmonary fibrosis in probable UIP  pattern CT high-resolution 12/12/2021-stable findings of pulmonary fibrosis and probable UIP pattern I have reviewed the images personally.  PFTs: 03/29/2019 FVC 3.32 [34%], FEV1 2.59 [80%],/78, TLC 5.78 [79%], DLCO 14.25 [54%] Minimal restriction with moderate diffusion defect.  05/31/2022 FVC 3.51 [82%], FEV1 2.68 [87%], F/F76, TLC 5.73 [80%], DLCO 15.75 [62%] Mild diffusion defect  mMRC  02/10/2019- 3 03/16/2019-3  Sleep: PSG 08/03/2011 Very mild OSA with AHI 9, desats to 86%, significant leg movements suspicious for PLM  Overnight oximetry 02/16/2019 Time less than 88% - 15 minutes, 32 seconds Nadir O2 sat of 83%. Oxygen desaturation index 27.5  CPAP titration 05/05/2019 Trial CPAP therapy on 14 cmH2O. no need for supplemental oxygen  BiPAP titration 05/16/2021 Trial BiPAP 22/18  Assessment:  Post COVID-19 pneumonia CT reviewed with probable UIP pattern pulmonary fibrosis Tapered off prednisone in March 2021 and off supplemental oxygen Continue exercise regimen at home  He continues to do well with improvement in diffusion capacity and we will monitor those  Sleep apnea Currently on BiPAP and is doing well \ Ex-smoker  Symptoms responded to Trelegy.  Will continue same.  Plan/Recommendations: Continue BiPAP, Trelegy.  Follow-up in 1 year.  Chilton Greathouse MD [facility] 05/31/2022, 10:10 AM  CC: [facility], [facility]*

## 2022-05-31 NOTE — Progress Notes (Signed)
Full PFT performed today. °

## 2022-06-04 DIAGNOSIS — M6289 Other specified disorders of muscle: Secondary | ICD-10-CM | POA: Diagnosis not present

## 2022-06-04 DIAGNOSIS — M545 Low back pain, unspecified: Secondary | ICD-10-CM | POA: Diagnosis not present

## 2022-06-04 DIAGNOSIS — Z6830 Body mass index (BMI) 30.0-30.9, adult: Secondary | ICD-10-CM | POA: Diagnosis not present

## 2022-06-04 DIAGNOSIS — Z1389 Encounter for screening for other disorder: Secondary | ICD-10-CM | POA: Diagnosis not present

## 2022-06-04 DIAGNOSIS — Z0000 Encounter for general adult medical examination without abnormal findings: Secondary | ICD-10-CM | POA: Diagnosis not present

## 2022-06-10 ENCOUNTER — Other Ambulatory Visit: Payer: Self-pay | Admitting: Cardiology

## 2022-06-10 ENCOUNTER — Other Ambulatory Visit: Payer: Self-pay | Admitting: Nurse Practitioner

## 2022-06-10 DIAGNOSIS — J310 Chronic rhinitis: Secondary | ICD-10-CM

## 2022-07-03 ENCOUNTER — Ambulatory Visit: Payer: Medicare Other | Attending: Physician Assistant | Admitting: Physician Assistant

## 2022-07-03 ENCOUNTER — Encounter: Payer: Self-pay | Admitting: Physician Assistant

## 2022-07-03 VITALS — BP 100/58 | Ht 70.0 in | Wt 208.4 lb

## 2022-07-03 DIAGNOSIS — E785 Hyperlipidemia, unspecified: Secondary | ICD-10-CM

## 2022-07-03 DIAGNOSIS — I25810 Atherosclerosis of coronary artery bypass graft(s) without angina pectoris: Secondary | ICD-10-CM

## 2022-07-03 DIAGNOSIS — I5022 Chronic systolic (congestive) heart failure: Secondary | ICD-10-CM

## 2022-07-03 DIAGNOSIS — I10 Essential (primary) hypertension: Secondary | ICD-10-CM | POA: Diagnosis not present

## 2022-07-03 DIAGNOSIS — I4821 Permanent atrial fibrillation: Secondary | ICD-10-CM | POA: Diagnosis not present

## 2022-07-03 DIAGNOSIS — R42 Dizziness and giddiness: Secondary | ICD-10-CM | POA: Diagnosis not present

## 2022-07-03 MED ORDER — FUROSEMIDE 20 MG PO TABS: 20.0000 mg | ORAL_TABLET | Freq: Every day | ORAL | 1 refills | Status: DC

## 2022-07-03 MED ORDER — METOPROLOL SUCCINATE ER 50 MG PO TB24: 25.0000 mg | ORAL_TABLET | Freq: Every day | ORAL | 3 refills | Status: DC

## 2022-07-03 MED ORDER — SACUBITRIL-VALSARTAN 24-26 MG PO TABS: 1.0000 | ORAL_TABLET | Freq: Two times a day (BID) | ORAL | 1 refills | Status: DC

## 2022-07-03 NOTE — Progress Notes (Signed)
Cardiology Office Note:  .   Date:  07/05/2022  ID:  Rodney Burns, DOB 03/13/1945, MRN [id number] PCP: [name], DO  [facility] Providers Cardiologist:  Peter [name], MD     History of Present Illness: .   Rodney Burns is a 77 y.o. male with past medical history of atrial flutter, HTN, HLD, CAD s/p CABG 1994, pericarditis, chronic systolic heart failure, OSA on CPAP, CLL and thoracic aortic aneurysm.  He underwent radiofrequency ablation in 2011 of atrial tachycardia/flutter however had a recurrent atrial flutter after his ablation.  He had a repeat ablation of atrial tachycardia/flutter in 2013 by Dr. Johney Frame.  Myoview in March 2016 showed inferior and anterior apical infarct without ischemia, EF 32%.  Echocardiogram obtained in January 2020 due to pericarditis showed EF 40 to 45%, hypokinesis of the anteroseptal and inferior septal myocardium, trivial AI, mild MR.  Repeat echocardiogram in October 2020 showed EF 40 to 45%, hypokinesis of the anteroseptal and inferoseptal myocardium, grade 1 DD, mild MR, 40 mm aortic root dilatation.  He had significant respiratory failure with COVID-19 infection in December 2020.  CTA in January 2021 showed pulmonary fibrosis.  Repeat echocardiogram in March 2021 showed EF 35 to 40%, global hypokinesis, normal pulmonary artery systolic pressure, trivial AI, mild dilatation of the aortic root measuring 37 mm.  He was in atrial flutter at the time of echocardiogram.  By the time he was seen October 2021, EKG showed he was back in sinus rhythm.  He had a significant orthostatic hypotension and bradycardia, spironolactone and Toprol-XL were decreased.  He was seen in February 2023 for dizziness and lightheadedness.  proBNP was elevated.  The spironolactone was increased.  Lasix was also added to medical regimen.  EKG has revealed he was in rate controlled atrial fibrillation at the time.  Outpatient cardioversion was scheduled however he converted back to sinus  rhythm prior to the cardioversion.  Repeat echocardiogram in March 2023 showed EF 35%, global hypokinesis, mild MR, dilated aortic root 40 mm.  When seen for follow-up, he was bradycardic, heart monitor was placed which revealed recurrent A-fib with slow heart rate.  Beta-blocker was reduced.  Consideration for antiarrhythmic therapy limited to Tikosyn due to comorbidities.  He was last seen by Dr. [name] in December 2023 at which time he was doing well.  He was in rate controlled A-fib at the time and was asymptomatic, therefore Dr. [name] recommended rate control strategy.  Recent PFT obtained on 05/31/2022 showed mild diffusion defect, FEV1 86%, FVC 85%, FEV1 to FVC ratio 74%.  Patient presents today for follow-up.  He does have occasional dizziness with changing the body positions, however no dizziness otherwise.  Current concern is low blood pressure and a slow heart rate.  His systolic blood pressure has been dipping down to the 80s while heart rate remained in the low 40s.  Although our medication list suggest he is on metoprolol succinate 50 mg a.m. and 25 mg p.m., he says he has been on metoprolol succinate 25 mg twice a day dosing.  I recommend decreased metoprolol succinate dosage further down to 25 mg daily.  I also recommended reduced dose of Entresto from the current 49-51 mg twice a day down to 24-26 mg twice a day to give him a little bit more blood pressure room back.  We will see the patient back in 6 weeks to make sure his blood pressure and the heart rate improves.  Otherwise he has no significant  chest discomfort.   ROS:   Patient endorsed dizziness especially with change in body position  He denies chest pain, palpitations, dyspnea, pnd, orthopnea, n, v, syncope, edema, weight gain, or early satiety. All other systems reviewed and are otherwise negative except as noted above.    Studies Reviewed: [name]    EKG: Atrial fibrillation, heart rate 56 bpm  Cardiac Studies & Procedures        ECHOCARDIOGRAM  ECHOCARDIOGRAM COMPLETE 03/22/2021  Narrative ECHOCARDIOGRAM REPORT    Patient Name:   Rodney Burns  Date of Exam: 03/22/2021 Medical Rec #:  [id number]     Height:       70.0 in Accession #:    [id number]    Weight:       211.3 lb Date of Birth:  November 20, 1945      BSA:          2.137 m Patient Age:    38 years      BP:           130/72 mmHg Patient Gender: M             HR:           61 bpm. Exam Location:  [address]  Procedure: 2D Echo, Cardiac Doppler, Color Doppler and Intracardiac Opacification Agent  Indications:    I48.91 Atrial fibrillation  History:        Patient has prior history of Echocardiogram examinations, most recent 04/14/2019. Cardiomyopathy and CHF, Pericardial Disease, Prior CABG, Arrythmias:Atrial Flutter and Atrial Fibrillation, Signs/Symptoms:Shortness of Breath and Syncope; Risk Factors:Hypertension, Sleep Apnea and Dyslipidemia. COVID-19. CLL.  Sonographer:    Jorje Guild BS, RDCS Referring Phys: [phone] [name] [name]   Sonographer Comments: Technically difficult study due to poor echo windows and suboptimal apical window. Image acquisition challenging due to patient body habitus. IMPRESSIONS   1. Left ventricular ejection fraction, by estimation, is 35%. The left ventricle has moderately decreased function. The left ventricle demonstrates global hypokinesis. The left ventricular internal cavity size was mildly dilated. Left ventricular diastolic parameters are indeterminate. 2. Right ventricular systolic function is normal. The right ventricular size is mildly enlarged. Tricuspid regurgitation signal is inadequate for assessing PA pressure. 3. Left atrial size was mildly dilated. 4. Right atrial size was mildly dilated. 5. The mitral valve is normal in structure. Mild mitral valve regurgitation. No evidence of mitral stenosis. 6. The aortic valve is tricuspid. There is mild calcification of the aortic valve. Aortic valve  regurgitation is trivial. Aortic valve sclerosis/calcification is present, without any evidence of aortic stenosis. 7. Aortic dilatation noted. There is mild dilatation of the aortic root, measuring 40 mm. 8. The inferior vena cava is dilated in size with >50% respiratory variability, suggesting right atrial pressure of 8 mmHg. 9. The patient was in atrial fibrillation.  Comparison(s): 04/14/19 EF 35-40%.  FINDINGS Left Ventricle: Left ventricular ejection fraction, by estimation, is 35%. The left ventricle has moderately decreased function. The left ventricle demonstrates global hypokinesis. Definity contrast agent was given IV to delineate the left ventricular endocardial borders. The left ventricular internal cavity size was mildly dilated. There is no left ventricular hypertrophy. Left ventricular diastolic parameters are indeterminate.  Right Ventricle: The right ventricular size is mildly enlarged. No increase in right ventricular wall thickness. Right ventricular systolic function is normal. Tricuspid regurgitation signal is inadequate for assessing PA pressure.  Left Atrium: Left atrial size was mildly dilated.  Right Atrium: Right atrial size was mildly dilated.  Pericardium:  There is no evidence of pericardial effusion.  Mitral Valve: The mitral valve is normal in structure. There is mild calcification of the mitral valve leaflet(s). Mild mitral valve regurgitation. No evidence of mitral valve stenosis.  Tricuspid Valve: The tricuspid valve is normal in structure. Tricuspid valve regurgitation is not demonstrated.  Aortic Valve: The aortic valve is tricuspid. There is mild calcification of the aortic valve. Aortic valve regurgitation is trivial. Aortic valve sclerosis/calcification is present, without any evidence of aortic stenosis.  Pulmonic Valve: The pulmonic valve was normal in structure. Pulmonic valve regurgitation is trivial.  Aorta: Aortic dilatation noted. There is mild  dilatation of the aortic root, measuring 40 mm.  Venous: The inferior vena cava is dilated in size with greater than 50% respiratory variability, suggesting right atrial pressure of 8 mmHg.  IAS/Shunts: No atrial level shunt detected by color flow Doppler.   LEFT VENTRICLE PLAX 2D LVIDd:         5.50 cm LVIDs:         4.60 cm LV PW:         1.10 cm LV IVS:        0.90 cm LVOT diam:     2.60 cm LV SV:         96 LV SV Index:   45 LVOT Area:     5.31 cm   RIGHT VENTRICLE          IVC RV Basal diam:  4.30 cm  IVC diam: 2.10 cm TAPSE (M-mode): 1.1 cm  LEFT ATRIUM             Index        RIGHT ATRIUM           Index LA diam:        5.20 cm 2.43 cm/m   RA Pressure: 8.00 mmHg LA Vol (A2C):   33.7 ml 15.77 ml/m  RA Area:     21.30 cm LA Vol (A4C):   61.0 ml 28.55 ml/m  RA Volume:   61.80 ml  28.92 ml/m LA Biplane Vol: 46.5 ml 21.76 ml/m AORTIC VALVE LVOT Vmax:   105.90 cm/s LVOT Vmean:  67.300 cm/s LVOT VTI:    0.180 m  AORTA Ao Root diam: 4.00 cm Ao Asc diam:  3.80 cm  TRICUSPID VALVE Estimated RAP:  8.00 mmHg  SHUNTS Systemic VTI:  0.18 m Systemic Diam: 2.60 cm  Dalton McleanMD Electronically signed by Wilfred Lacy Signature Date/Time: 03/22/2021/5:25:46 PM    Final    MONITORS  LONG TERM MONITOR (3-14 DAYS) 06/05/2021  Narrative  Atrial fibrillation with rate ranging from 31-96 bpm. average 57 bpm.  Pauses of 3.8 3.9 and 4.19 seconds occuring at 4:20 am and 7 am. No symptoms recorded  Occ PVCs. with rate run of NSVT up to 5 beats.  Symptoms of lightheadedness and dizziness appear to correlate with Afib with rate from 45-70 bpm   Patch Wear Time:  3 days and 0 hours (2023-05-05T15:15:29-0400 to 2023-05-08T15:45:04-398)  3 Ventricular Tachycardia runs occurred, the run with the fastest interval lasting 4 beats with a max rate of 226 bpm (avg 158 bpm); the run with the fastest interval was also the longest. Atrial Fibrillation occurred  continuously (100% burden), ranging from 31-96 bpm (avg of 57 bpm). 26 Pauses occurred, the longest lasting 4.1 secs (15 bpm). Isolated VEs were frequent (10.1%, 25242), VE Couplets were rare (<1.0%, 63), and VE Triplets were rare (<1.0%, 7). Ventricular Bigeminy and Trigeminy were present.  MD notification criteria for Slow Atrial Fibrillation met - report posted prior to notification per account request (AR).           Risk Assessment/Calculations:    CHA2DS2-VASc Score = 5   This indicates a 7.2% annual risk of stroke. The patient's score is based upon: CHF History: 1 HTN History: 1 Diabetes History: 0 Stroke History: 0 Vascular Disease History: 1 Age Score: 2 Gender Score: 0          Physical Exam:   VS:  BP (!) 100/58 (BP Location: Left Arm, Patient Position: Sitting, Cuff Size: Normal)   Ht 5\' 10"  (1.778 m)   Wt 208 lb 6.4 oz (94.5 kg)   SpO2 99%   BMI 29.90 kg/m    Wt Readings from Last 3 Encounters:  07/03/22 208 lb 6.4 oz (94.5 kg)  05/31/22 212 lb 8 oz (96.4 kg)  03/26/22 213 lb 6.4 oz (96.8 kg)    GEN: Well nourished, well developed in no acute distress NECK: No JVD; No carotid bruits CARDIAC: Irregularly irregular, no murmurs, rubs, gallops RESPIRATORY:  Clear to auscultation without rales, wheezing or rhonchi  ABDOMEN: Soft, non-tender, non-distended EXTREMITIES:  No edema; No deformity   ASSESSMENT AND PLAN: .    Dizziness: He has both low blood pressure and a slow heart rate.  Although our medication list suggest he is on metoprolol succinate 50 mg a.m. and 25 mg p.m., he says he is actually taking metoprolol succinate 25 mg twice a day.  I will reduce metoprolol succinate to 25 mg daily and reduce Entresto to 24-26 mg twice a day.  Permanent atrial fibrillation: Heart rate has been borderline low recently in the 40s.  Will reduce metoprolol succinate to 25 mg daily  CAD s/p CABG: Denies any recent chest pain  Hypertension: Blood pressure  stable  Hyperlipidemia: On Vytorin.        Dispo: Reassess with APP in 6 weeks to make sure blood pressure improves.  Signed, Azalee Course, PA

## 2022-07-03 NOTE — Patient Instructions (Addendum)
Medication Instructions:   DECREASE Entresto to 24 mg-26 mg 2 times a day  DECREASE Metoprolol Succinate (Toprol-XL) to 25 mg (half tablet) daily   *If you need a refill on your cardiac medications before your next appointment, please call your pharmacy*  Lab Work:  NONE ordered at this time of appointment   If you have labs (blood work) drawn today and your tests are completely normal, you will receive your results only by: MyChart Message (if you have MyChart) OR A paper copy in the mail If you have any lab test that is abnormal or we need to change your treatment, we will call you to review the results.  Testing/Procedures: NONE ordered at this time of appointment   Follow-Up: At [facility], you and your health needs are our priority.  As part of our continuing mission to provide you with exceptional heart care, we have created designated Provider Care Teams.  These Care Teams include your primary Cardiologist (physician) and Advanced Practice Providers (APPs -  Physician Assistants and Nurse Practitioners) who all work together to provide you with the care you need, when you need it.  Your next appointment:   6 week(s)  Provider:   Azalee Course, PA-C or or APP        Other Instructions

## 2022-07-08 ENCOUNTER — Other Ambulatory Visit: Payer: Self-pay | Admitting: Cardiology

## 2022-07-08 DIAGNOSIS — M545 Low back pain, unspecified: Secondary | ICD-10-CM | POA: Diagnosis not present

## 2022-07-08 DIAGNOSIS — I4821 Permanent atrial fibrillation: Secondary | ICD-10-CM

## 2022-07-08 NOTE — Telephone Encounter (Signed)
Prescription refill request for Xarelto received.  Indication:AFIB Last office visit:6/24 Weight:94.5  kg Age:77 Scr:1.05  3/24 CrCl:78.75  ml/min  Prescription refilled

## 2022-07-10 DIAGNOSIS — M545 Low back pain, unspecified: Secondary | ICD-10-CM | POA: Diagnosis not present

## 2022-07-15 DIAGNOSIS — M545 Low back pain, unspecified: Secondary | ICD-10-CM | POA: Diagnosis not present

## 2022-07-17 DIAGNOSIS — M545 Low back pain, unspecified: Secondary | ICD-10-CM | POA: Diagnosis not present

## 2022-07-22 DIAGNOSIS — M545 Low back pain, unspecified: Secondary | ICD-10-CM | POA: Diagnosis not present

## 2022-07-24 DIAGNOSIS — M545 Low back pain, unspecified: Secondary | ICD-10-CM | POA: Diagnosis not present

## 2022-07-29 DIAGNOSIS — M545 Low back pain, unspecified: Secondary | ICD-10-CM | POA: Diagnosis not present

## 2022-07-31 DIAGNOSIS — M545 Low back pain, unspecified: Secondary | ICD-10-CM | POA: Diagnosis not present

## 2022-08-05 DIAGNOSIS — M545 Low back pain, unspecified: Secondary | ICD-10-CM | POA: Diagnosis not present

## 2022-08-07 DIAGNOSIS — M545 Low back pain, unspecified: Secondary | ICD-10-CM | POA: Diagnosis not present

## 2022-08-13 DIAGNOSIS — D692 Other nonthrombocytopenic purpura: Secondary | ICD-10-CM | POA: Diagnosis not present

## 2022-08-13 DIAGNOSIS — L821 Other seborrheic keratosis: Secondary | ICD-10-CM | POA: Diagnosis not present

## 2022-08-13 DIAGNOSIS — D1801 Hemangioma of skin and subcutaneous tissue: Secondary | ICD-10-CM | POA: Diagnosis not present

## 2022-08-13 DIAGNOSIS — Z129 Encounter for screening for malignant neoplasm, site unspecified: Secondary | ICD-10-CM | POA: Diagnosis not present

## 2022-08-13 DIAGNOSIS — L820 Inflamed seborrheic keratosis: Secondary | ICD-10-CM | POA: Diagnosis not present

## 2022-08-13 DIAGNOSIS — Z85828 Personal history of other malignant neoplasm of skin: Secondary | ICD-10-CM | POA: Diagnosis not present

## 2022-08-14 ENCOUNTER — Ambulatory Visit: Payer: Medicare Other | Attending: Physician Assistant | Admitting: Physician Assistant

## 2022-08-14 ENCOUNTER — Other Ambulatory Visit: Payer: Self-pay | Admitting: Physician Assistant

## 2022-08-14 ENCOUNTER — Other Ambulatory Visit: Payer: Self-pay | Admitting: *Deleted

## 2022-08-14 VITALS — BP 116/58 | HR 43 | Ht 70.0 in | Wt 208.0 lb

## 2022-08-14 DIAGNOSIS — E785 Hyperlipidemia, unspecified: Secondary | ICD-10-CM | POA: Diagnosis not present

## 2022-08-14 DIAGNOSIS — N529 Male erectile dysfunction, unspecified: Secondary | ICD-10-CM

## 2022-08-14 DIAGNOSIS — R001 Bradycardia, unspecified: Secondary | ICD-10-CM

## 2022-08-14 DIAGNOSIS — I4821 Permanent atrial fibrillation: Secondary | ICD-10-CM

## 2022-08-14 DIAGNOSIS — I25810 Atherosclerosis of coronary artery bypass graft(s) without angina pectoris: Secondary | ICD-10-CM | POA: Diagnosis not present

## 2022-08-14 DIAGNOSIS — I5022 Chronic systolic (congestive) heart failure: Secondary | ICD-10-CM | POA: Diagnosis not present

## 2022-08-14 DIAGNOSIS — I10 Essential (primary) hypertension: Secondary | ICD-10-CM

## 2022-08-14 MED ORDER — EMPAGLIFLOZIN 10 MG PO TABS: 10.0000 mg | ORAL_TABLET | Freq: Every day | ORAL | 3 refills | Status: DC

## 2022-08-14 MED ORDER — SILDENAFIL CITRATE 20 MG PO TABS: 20.0000 mg | ORAL_TABLET | Freq: Every day | ORAL | 2 refills | Status: DC | PRN

## 2022-08-14 MED ORDER — EMPAGLIFLOZIN 10 MG PO TABS: 10.0000 mg | ORAL_TABLET | Freq: Every day | ORAL | Status: DC

## 2022-08-14 NOTE — Patient Instructions (Signed)
Medication Instructions:  Your physician has recommended you make the following change in your medication:   STOP Metoprolol  START Jardiance 10 mg taking 1 daily  *If you need a refill on your cardiac medications before your next appointment, please call your pharmacy*   Lab Work: 09/14/22:  COME TO THE OFFICE FOR A BMET, LAB OPENS AT 8:00 AM.    If you have labs (blood work) drawn today and your tests are completely normal, you will receive your results only by: MyChart Message (if you have MyChart) OR A paper copy in the mail If you have any lab test that is abnormal or we need to change your treatment, we will call you to review the results.   Testing/Procedures: None ordered   Follow-Up: At [facility], you and your health needs are our priority.  As part of our continuing mission to provide you with exceptional heart care, we have created designated Provider Care Teams.  These Care Teams include your primary Cardiologist (physician) and Advanced Practice Providers (APPs -  Physician Assistants and Nurse Practitioners) who all work together to provide you with the care you need, when you need it.  We recommend signing up for the patient portal called "MyChart".  Sign up information is provided on this After Visit Summary.  MyChart is used to connect with patients for Virtual Visits (Telemedicine).  Patients are able to view lab/test results, encounter notes, upcoming appointments, etc.  Non-urgent messages can be sent to your provider as well.   To learn more about what you can do with MyChart, go to [email or web address].    Your next appointment:   3 month(s)  Provider:   Peter [name], MD     Other Instructions

## 2022-08-14 NOTE — Progress Notes (Signed)
Cardiology Office Note:  .   Date:  08/14/2022  ID:  Rodney Burns, DOB [birth year], MRN [id number] PCP: [name], DO  [facility] Providers Cardiologist:  Peter [name], MD     History of Present Illness: .   Rodney Burns is a 77 y.o. male with past medical history of atrial flutter, HTN, HLD, CAD s/p CABG 1994, pericarditis, chronic systolic heart failure, OSA on CPAP, CLL and thoracic aortic aneurysm.  He underwent radiofrequency ablation in 2011 of atrial tachycardia/flutter however had a recurrent atrial flutter after his ablation.  He had a repeat ablation of atrial tachycardia/flutter in 2013 by Dr. Johney Frame.  Myoview in March 2016 showed inferior and anterior apical infarct without ischemia, EF 32%.  Echocardiogram obtained in January 2020 due to pericarditis showed EF 40 to 45%, hypokinesis of the anteroseptal and inferior septal myocardium, trivial AI, mild MR.  Repeat echocardiogram in October 2020 showed EF 40 to 45%, hypokinesis of the anteroseptal and inferoseptal myocardium, grade 1 DD, mild MR, 40 mm aortic root dilatation.  He had significant respiratory failure with COVID-19 infection in December 2020.  CTA in January 2021 showed pulmonary fibrosis.  Repeat echocardiogram in March 2021 showed EF 35 to 40%, global hypokinesis, normal pulmonary artery systolic pressure, trivial AI, mild dilatation of the aortic root measuring 37 mm.  He was in atrial flutter at the time of echocardiogram.  By the time he was seen October 2021, EKG showed he was back in sinus rhythm.  He had a significant orthostatic hypotension and bradycardia, spironolactone and Toprol-XL were decreased.    He was seen in February 2023 for dizziness and lightheadedness.  proBNP was elevated.  The spironolactone was increased.  Lasix was also added to medical regimen.  EKG has revealed he was in rate controlled atrial fibrillation at the time.  Outpatient cardioversion was scheduled however he converted back to  sinus rhythm prior to the cardioversion.  Repeat echocardiogram in March 2023 showed EF 35%, global hypokinesis, mild MR, dilated aortic root 40 mm.  When seen for follow-up, he was bradycardic, heart monitor was placed which revealed recurrent A-fib with slow heart rate.  Beta-blocker was reduced.  Consideration for antiarrhythmic therapy limited to Tikosyn due to comorbidities.  He was seen by Dr. [name] in December 2023 at which time he was doing well.  He was in rate controlled A-fib at the time and was asymptomatic, therefore Dr. [name] recommended rate control strategy.  PFT obtained on 05/31/2022 showed mild diffusion defect, FEV1 86%, FVC 85%, FEV1 to FVC ratio 74%.  I last saw the patient in June 2024 at which time he complained of occasional dizziness with changing the body position.  His blood pressure was low that day.  He has concern for both bradycardia and hypotension.  Systolic blood pressure has been dipping down to the 80s while heart rate in the low 40s.  Although our medication list indicated he was supposed to be on metoprolol succinate 50 mg a.m. and 25 mg p.m., patient says he has been taking metoprolol succinate at 25 mg twice a day.  Therefore I decreased metoprolol succinate further down to 25 mg daily.  I also reduced Entresto from 49-51 mg twice a day down to 24-26 mg twice a day.  He returns today for reevaluation.  Patient presents today for follow-up.  Blood pressure is better after reducing the dose of metoprolol succinate and Entresto.  He no longer feels dizzy.  Unfortunately, he is still  bradycardic.  He has converted back to sinus rhythm based on today's EKG.  Heart rate is 43 bpm on 25 mg daily of metoprolol succinate.  I will discontinue metoprolol succinate.  I plan to add Jardiance for heart failure regimen.  With addition of Jardiance, he can change Lasix to as needed.  He will need basic metabolic panel again 1 month.  He can follow-up with Dr. [name] in 3 to 4 months.   He requested a prescription of sildenafil, I will clear with Dr. [name] before prescribing the medication to him.  ROS:   He denies chest pain, palpitations, dyspnea, pnd, orthopnea, n, v, dizziness, syncope, edema, weight gain, or early satiety. All other systems reviewed and are otherwise negative except as noted above.    Studies Reviewed: .        Cardiac Studies & Procedures       ECHOCARDIOGRAM  ECHOCARDIOGRAM COMPLETE 03/22/2021  Narrative ECHOCARDIOGRAM REPORT    Patient Name:   Rodney Burns  Date of Exam: 03/22/2021 Medical Rec #:  [id number]     Height:       70.0 in Accession #:    [id number]    Weight:       211.3 lb Date of Birth:  [birth year]      BSA:          2.137 m Patient Age:    75 years      BP:           130/72 mmHg Patient Gender: M             HR:           61 bpm. Exam Location:  [address]  Procedure: 2D Echo, Cardiac Doppler, Color Doppler and Intracardiac Opacification Agent  Indications:    I48.91 Atrial fibrillation  History:        Patient has prior history of Echocardiogram examinations, most recent 04/14/2019. Cardiomyopathy and CHF, Pericardial Disease, Prior CABG, Arrythmias:Atrial Flutter and Atrial Fibrillation, Signs/Symptoms:Shortness of Breath and Syncope; Risk Factors:Hypertension, Sleep Apnea and Dyslipidemia. COVID-19. CLL.  Sonographer:    Jorje Guild BS, RDCS Referring Phys: [id number] [name] [name]   Sonographer Comments: Technically difficult study due to poor echo windows and suboptimal apical window. Image acquisition challenging due to patient body habitus. IMPRESSIONS   1. Left ventricular ejection fraction, by estimation, is 35%. The left ventricle has moderately decreased function. The left ventricle demonstrates global hypokinesis. The left ventricular internal cavity size was mildly dilated. Left ventricular diastolic parameters are indeterminate. 2. Right ventricular systolic function is normal. The right ventricular  size is mildly enlarged. Tricuspid regurgitation signal is inadequate for assessing PA pressure. 3. Left atrial size was mildly dilated. 4. Right atrial size was mildly dilated. 5. The mitral valve is normal in structure. Mild mitral valve regurgitation. No evidence of mitral stenosis. 6. The aortic valve is tricuspid. There is mild calcification of the aortic valve. Aortic valve regurgitation is trivial. Aortic valve sclerosis/calcification is present, without any evidence of aortic stenosis. 7. Aortic dilatation noted. There is mild dilatation of the aortic root, measuring 40 mm. 8. The inferior vena cava is dilated in size with >50% respiratory variability, suggesting right atrial pressure of 8 mmHg. 9. The patient was in atrial fibrillation.  Comparison(s): 04/14/19 EF 35-40%.  FINDINGS Left Ventricle: Left ventricular ejection fraction, by estimation, is 35%. The left ventricle has moderately decreased function. The left ventricle demonstrates global hypokinesis. Definity contrast agent was given IV to  delineate the left ventricular endocardial borders. The left ventricular internal cavity size was mildly dilated. There is no left ventricular hypertrophy. Left ventricular diastolic parameters are indeterminate.  Right Ventricle: The right ventricular size is mildly enlarged. No increase in right ventricular wall thickness. Right ventricular systolic function is normal. Tricuspid regurgitation signal is inadequate for assessing PA pressure.  Left Atrium: Left atrial size was mildly dilated.  Right Atrium: Right atrial size was mildly dilated.  Pericardium: There is no evidence of pericardial effusion.  Mitral Valve: The mitral valve is normal in structure. There is mild calcification of the mitral valve leaflet(s). Mild mitral valve regurgitation. No evidence of mitral valve stenosis.  Tricuspid Valve: The tricuspid valve is normal in structure. Tricuspid valve regurgitation is not  demonstrated.  Aortic Valve: The aortic valve is tricuspid. There is mild calcification of the aortic valve. Aortic valve regurgitation is trivial. Aortic valve sclerosis/calcification is present, without any evidence of aortic stenosis.  Pulmonic Valve: The pulmonic valve was normal in structure. Pulmonic valve regurgitation is trivial.  Aorta: Aortic dilatation noted. There is mild dilatation of the aortic root, measuring 40 mm.  Venous: The inferior vena cava is dilated in size with greater than 50% respiratory variability, suggesting right atrial pressure of 8 mmHg.  IAS/Shunts: No atrial level shunt detected by color flow Doppler.   LEFT VENTRICLE PLAX 2D LVIDd:         5.50 cm LVIDs:         4.60 cm LV PW:         1.10 cm LV IVS:        0.90 cm LVOT diam:     2.60 cm LV SV:         96 LV SV Index:   45 LVOT Area:     5.31 cm   RIGHT VENTRICLE          IVC RV Basal diam:  4.30 cm  IVC diam: 2.10 cm TAPSE (M-mode): 1.1 cm  LEFT ATRIUM             Index        RIGHT ATRIUM           Index LA diam:        5.20 cm 2.43 cm/m   RA Pressure: 8.00 mmHg LA Vol (A2C):   33.7 ml 15.77 ml/m  RA Area:     21.30 cm LA Vol (A4C):   61.0 ml 28.55 ml/m  RA Volume:   61.80 ml  28.92 ml/m LA Biplane Vol: 46.5 ml 21.76 ml/m AORTIC VALVE LVOT Vmax:   105.90 cm/s LVOT Vmean:  67.300 cm/s LVOT VTI:    0.180 m  AORTA Ao Root diam: 4.00 cm Ao Asc diam:  3.80 cm  TRICUSPID VALVE Estimated RAP:  8.00 mmHg  SHUNTS Systemic VTI:  0.18 m Systemic Diam: 2.60 cm  Dalton McleanMD Electronically signed by Wilfred Lacy Signature Date/Time: 03/22/2021/5:25:46 PM    Final    MONITORS  LONG TERM MONITOR (3-14 DAYS) 06/05/2021  Narrative  Atrial fibrillation with rate ranging from 31-96 bpm. average 57 bpm.  Pauses of 3.8 3.9 and 4.19 seconds occuring at 4:20 am and 7 am. No symptoms recorded  Occ PVCs. with rate run of NSVT up to 5 beats.  Symptoms of lightheadedness and  dizziness appear to correlate with Afib with rate from 45-70 bpm   Patch Wear Time:  3 days and 0 hours (2023-05-05T15:15:29-0400 to 2023-05-08T15:45:04-398)  3 Ventricular Tachycardia runs  occurred, the run with the fastest interval lasting 4 beats with a max rate of 226 bpm (avg 158 bpm); the run with the fastest interval was also the longest. Atrial Fibrillation occurred continuously (100% burden), ranging from 31-96 bpm (avg of 57 bpm). 26 Pauses occurred, the longest lasting 4.1 secs (15 bpm). Isolated VEs were frequent (10.1%, 25242), VE Couplets were rare (<1.0%, 63), and VE Triplets were rare (<1.0%, 7). Ventricular Bigeminy and Trigeminy were present. MD notification criteria for Slow Atrial Fibrillation met - report posted prior to notification per account request (AR).           Risk Assessment/Calculations:    CHA2DS2-VASc Score = 5   This indicates a 7.2% annual risk of stroke. The patient's score is based upon: CHF History: 1 HTN History: 1 Diabetes History: 0 Stroke History: 0 Vascular Disease History: 1 Age Score: 2 Gender Score: 0            Physical Exam:   VS:  BP (!) 116/58   Pulse (!) 43   Ht 5\' 10"  (1.778 m)   Wt 208 lb (94.3 kg)   BMI 29.84 kg/m    Wt Readings from Last 3 Encounters:  08/14/22 208 lb (94.3 kg)  07/03/22 208 lb 6.4 oz (94.5 kg)  05/31/22 212 lb 8 oz (96.4 kg)    GEN: Well nourished, well developed in no acute distress NECK: No JVD; No carotid bruits CARDIAC: RRR, no murmurs, rubs, gallops RESPIRATORY:  Clear to auscultation without rales, wheezing or rhonchi  ABDOMEN: Soft, non-tender, non-distended EXTREMITIES:  No edema; No deformity   ASSESSMENT AND PLAN: .    Permanent atrial fibrillation: Dr. [name] previously recommended rate control therapy as the patient is asymptomatic in atrial fibrillation.  It appears he is still going in and out of A-fib.  He is back in sinus rhythm based on today's EKG.  Continue  Xarelto  Bradycardia: During the last visit, patient has significant dizziness associated with both severe bradycardia and also hypotension.  I reduced the dose of her metoprolol succinate down to 25 mg daily and also reduced Entresto.  EKG today shows he is back in sinus rhythm, however is severely bradycardic with heart rate of 43 bpm, I recommended discontinuation of metoprolol succinate.  He is no longer dizzy despite bradycardia.  CAD s/p CABG 1994: Denies any recent chest pain.  Chronic systolic heart failure: I plan to discontinue metoprolol succinate due to significant bradycardia of 43 bpm on the 25 mg dosage.  Continue Entresto, spironolactone, and Jardiance for heart failure management.  Will change Lasix 20 mg to as needed given initiation of Jardiance.  Discussed side effect of Jardiance, recommended good hygiene to avoid fungal infection.  Hypertension: Blood pressure improved after reducing the dose of metoprolol succinate and Entresto.  Blood pressure has improved, heart rate remained low.  Will discontinue metoprolol succinate.  Plan to reevaluate on the next visit, may increase Entresto back up to 49-51 mg twice a day if blood pressure allows.  Hyperlipidemia: On Vytorin.  Erectile dysfunction: Patient requested some sildenafil to help with the ED.  I will clear with Dr. [name] before prescribing sildenafil.        Dispo: Follow-up with Dr. [name] in 3 to 4 months.  Signed, Azalee Course, PA

## 2022-09-13 DIAGNOSIS — I4821 Permanent atrial fibrillation: Secondary | ICD-10-CM | POA: Diagnosis not present

## 2022-09-24 DIAGNOSIS — H26493 Other secondary cataract, bilateral: Secondary | ICD-10-CM | POA: Diagnosis not present

## 2022-09-24 DIAGNOSIS — H35372 Puckering of macula, left eye: Secondary | ICD-10-CM | POA: Diagnosis not present

## 2022-09-24 DIAGNOSIS — D3132 Benign neoplasm of left choroid: Secondary | ICD-10-CM | POA: Diagnosis not present

## 2022-09-24 DIAGNOSIS — H40013 Open angle with borderline findings, low risk, bilateral: Secondary | ICD-10-CM | POA: Diagnosis not present

## 2022-10-16 DIAGNOSIS — C9110 Chronic lymphocytic leukemia of B-cell type not having achieved remission: Secondary | ICD-10-CM | POA: Diagnosis not present

## 2022-10-16 DIAGNOSIS — Z6829 Body mass index (BMI) 29.0-29.9, adult: Secondary | ICD-10-CM | POA: Diagnosis not present

## 2022-10-16 DIAGNOSIS — K219 Gastro-esophageal reflux disease without esophagitis: Secondary | ICD-10-CM | POA: Diagnosis not present

## 2022-10-16 DIAGNOSIS — I5022 Chronic systolic (congestive) heart failure: Secondary | ICD-10-CM | POA: Diagnosis not present

## 2022-10-16 DIAGNOSIS — I4819 Other persistent atrial fibrillation: Secondary | ICD-10-CM | POA: Diagnosis not present

## 2022-10-16 DIAGNOSIS — I10 Essential (primary) hypertension: Secondary | ICD-10-CM | POA: Diagnosis not present

## 2022-11-26 ENCOUNTER — Encounter: Payer: Self-pay | Admitting: Physician Assistant

## 2022-11-26 ENCOUNTER — Ambulatory Visit: Payer: Medicare Other | Attending: Physician Assistant | Admitting: Physician Assistant

## 2022-11-26 VITALS — BP 98/60 | HR 74 | Ht 70.0 in | Wt 203.0 lb

## 2022-11-26 DIAGNOSIS — I10 Essential (primary) hypertension: Secondary | ICD-10-CM | POA: Insufficient documentation

## 2022-11-26 DIAGNOSIS — E785 Hyperlipidemia, unspecified: Secondary | ICD-10-CM | POA: Diagnosis not present

## 2022-11-26 DIAGNOSIS — I25810 Atherosclerosis of coronary artery bypass graft(s) without angina pectoris: Secondary | ICD-10-CM | POA: Diagnosis not present

## 2022-11-26 DIAGNOSIS — I4821 Permanent atrial fibrillation: Secondary | ICD-10-CM | POA: Diagnosis not present

## 2022-11-26 NOTE — Patient Instructions (Signed)
Medication Instructions:  NO CHANGES *If you need a refill on your cardiac medications before your next appointment, please call your pharmacy*   Lab Work: NO LABS If you have labs (blood work) drawn today and your tests are completely normal, you will receive your results only by: MyChart Message (if you have MyChart) OR A paper copy in the mail If you have any lab test that is abnormal or we need to change your treatment, we will call you to review the results.   Testing/Procedures: NO TESTING   Follow-Up: At [facility], you and your health needs are our priority.  As part of our continuing mission to provide you with exceptional heart care, we have created designated Provider Care Teams.  These Care Teams include your primary Cardiologist (physician) and Advanced Practice Providers (APPs -  Physician Assistants and Nurse Practitioners) who all work together to provide you with the care you need, when you need it.    Your next appointment:   6 month(s)  Provider:   Peter [name], MD

## 2022-11-26 NOTE — Progress Notes (Unsigned)
Cardiology Office Note:  .   Date:  11/26/2022  ID:  Rodney Burns, DOB 1945/06/14, MRN [id number] PCP: [name], DO  [facility] Providers Cardiologist:  Peter [name], MD { Click to update primary MD,subspecialty MD or APP then REFRESH:1}   History of Present Illness: .   Rodney Burns is a 77 y.o. male with past medical history of atrial flutter, HTN, HLD, CAD s/p CABG 1994, pericarditis, chronic systolic heart failure, OSA on CPAP, CLL and thoracic aortic aneurysm. He underwent radiofrequency ablation in 2011 of atrial tachycardia/flutter however had a recurrent atrial flutter after his ablation. He had a repeat ablation of atrial tachycardia/flutter in 2013 by Dr. Johney Frame. Myoview in March 2016 showed inferior and anterior apical infarct without ischemia, EF 32%. Echocardiogram obtained in January 2020 due to pericarditis showed EF 40 to 45%, hypokinesis of the anteroseptal and inferior septal myocardium, trivial AI, mild MR. Repeat echocardiogram in October 2020 showed EF 40 to 45%, hypokinesis of the anteroseptal and inferoseptal myocardium, grade 1 DD, mild MR, 40 mm aortic root dilatation. He had significant respiratory failure with COVID-19 infection in December 2020. CTA in January 2021 showed pulmonary fibrosis. Repeat echocardiogram in March 2021 showed EF 35 to 40%, global hypokinesis, normal pulmonary artery systolic pressure, trivial AI, mild dilatation of the aortic root measuring 37 mm. He was in atrial flutter at the time of echocardiogram. By the time he was seen October 2021, EKG showed he was back in sinus rhythm. He had a significant orthostatic hypotension and bradycardia, spironolactone and Toprol-XL were decreased.   He was seen in February 2023 for dizziness and lightheadedness. proBNP was elevated. The spironolactone was increased. Lasix was also added to medical regimen. EKG has revealed he was in rate controlled atrial fibrillation at the time. Outpatient  cardioversion was scheduled however he converted back to sinus rhythm prior to the cardioversion. Repeat echocardiogram in March 2023 showed EF 35%, global hypokinesis, mild MR, dilated aortic root 40 mm. When seen for follow-up, he was bradycardic, heart monitor was placed which revealed recurrent A-fib with slow heart rate. Beta-blocker was reduced. Consideration for antiarrhythmic therapy limited to Tikosyn due to comorbidities. He was seen by Dr. [name] in December 2023 at which time he was doing well. He was in rate controlled A-fib at the time and was asymptomatic, therefore Dr. [name] recommended rate control strategy. PFT obtained on 05/31/2022 showed mild diffusion defect, FEV1 86%, FVC 85%, FEV1 to FVC ratio 74%.   I last saw the patient in June 2024 at which time he complained of occasional dizziness with changing the body position. His blood pressure was low that day. He has concern for both bradycardia and hypotension. Systolic blood pressure has been dipping down to the 80s while heart rate in the low 40s. Although our medication list indicated he was supposed to be on metoprolol succinate 50 mg a.m. and 25 mg p.m., patient says he has been taking metoprolol succinate at 25 mg twice a day. Therefore I decreased metoprolol succinate further down to 25 mg daily. I also reduced Entresto from 49-51 mg twice a day down to 24-26 mg twice a day. He returns today for reevaluation.   I saw the patient back in July 2024, his dizziness resolved after reducing the dose of metoprolol succinate and Entresto, however he was still bradycardic.  At the time of the visit, he had self converted back to sinus rhythm.  Heart rate was 43 bpm on 25 mg daily  of metoprolol succinate.  I stopped the metoprolol succinate and added Jardiance.  I also changed the Lasix to as needed with the addition of the Jardiance.   ROS: ***  Studies Reviewed: .        *** Risk Assessment/Calculations:   {Does this patient have  ATRIAL FIBRILLATION?[phone]}         Physical Exam:   VS:  BP 98/60 (BP Location: Left Arm, Patient Position: Sitting, Cuff Size: Large)   Pulse 74   Ht 5\' 10"  (1.778 m)   Wt 203 lb (92.1 kg)   SpO2 94%   BMI 29.13 kg/m    Wt Readings from Last 3 Encounters:  11/26/22 203 lb (92.1 kg)  08/14/22 208 lb (94.3 kg)  07/03/22 208 lb 6.4 oz (94.5 kg)    GEN: Well nourished, well developed in no acute distress NECK: No JVD; No carotid bruits CARDIAC: ***RRR, no murmurs, rubs, gallops RESPIRATORY:  Clear to auscultation without rales, wheezing or rhonchi  ABDOMEN: Soft, non-tender, non-distended EXTREMITIES:  No edema; No deformity   ASSESSMENT AND PLAN: .   ***    {Are you ordering a CV Procedure (e.g. stress test, cath, DCCV, TEE, etc)?   Press F2        :[id number]}  Dispo: ***  Signed, Azalee Course, PA

## 2022-12-04 ENCOUNTER — Other Ambulatory Visit: Payer: Self-pay | Admitting: Physician Assistant

## 2022-12-12 ENCOUNTER — Other Ambulatory Visit: Payer: Self-pay | Admitting: Physician Assistant

## 2022-12-17 ENCOUNTER — Other Ambulatory Visit: Payer: Medicare Other

## 2023-01-01 ENCOUNTER — Ambulatory Visit
Admission: RE | Admit: 2023-01-01 | Discharge: 2023-01-01 | Disposition: A | Discharge status: Home / Self Care | Payer: Medicare Other | Source: Ambulatory Visit | Attending: Pulmonary Disease | Admitting: Pulmonary Disease

## 2023-01-01 DIAGNOSIS — Z951 Presence of aortocoronary bypass graft: Secondary | ICD-10-CM | POA: Diagnosis not present

## 2023-01-01 DIAGNOSIS — J90 Pleural effusion, not elsewhere classified: Secondary | ICD-10-CM | POA: Diagnosis not present

## 2023-01-01 DIAGNOSIS — J849 Interstitial pulmonary disease, unspecified: Secondary | ICD-10-CM

## 2023-01-01 DIAGNOSIS — J8410 Pulmonary fibrosis, unspecified: Secondary | ICD-10-CM | POA: Diagnosis not present

## 2023-01-01 DIAGNOSIS — J439 Emphysema, unspecified: Secondary | ICD-10-CM | POA: Diagnosis not present

## 2023-02-04 ENCOUNTER — Other Ambulatory Visit: Payer: Self-pay | Admitting: Cardiology

## 2023-03-25 DIAGNOSIS — H40013 Open angle with borderline findings, low risk, bilateral: Secondary | ICD-10-CM | POA: Diagnosis not present

## 2023-03-26 ENCOUNTER — Inpatient Hospital Stay (HOSPITAL_BASED_OUTPATIENT_CLINIC_OR_DEPARTMENT_OTHER): Payer: Medicare Other | Admitting: Hematology and Oncology

## 2023-03-26 ENCOUNTER — Inpatient Hospital Stay: Payer: Medicare Other | Attending: Hematology and Oncology

## 2023-03-26 VITALS — BP 100/55 | HR 58 | Temp 98.1°F | Resp 18 | Ht 70.0 in | Wt 203.6 lb

## 2023-03-26 DIAGNOSIS — Z7984 Long term (current) use of oral hypoglycemic drugs: Secondary | ICD-10-CM | POA: Diagnosis not present

## 2023-03-26 DIAGNOSIS — R591 Generalized enlarged lymph nodes: Secondary | ICD-10-CM | POA: Diagnosis not present

## 2023-03-26 DIAGNOSIS — C9110 Chronic lymphocytic leukemia of B-cell type not having achieved remission: Secondary | ICD-10-CM | POA: Diagnosis not present

## 2023-03-26 DIAGNOSIS — G4733 Obstructive sleep apnea (adult) (pediatric): Secondary | ICD-10-CM | POA: Diagnosis not present

## 2023-03-26 DIAGNOSIS — I509 Heart failure, unspecified: Secondary | ICD-10-CM | POA: Diagnosis not present

## 2023-03-26 DIAGNOSIS — D696 Thrombocytopenia, unspecified: Secondary | ICD-10-CM | POA: Insufficient documentation

## 2023-03-26 DIAGNOSIS — Z79899 Other long term (current) drug therapy: Secondary | ICD-10-CM | POA: Diagnosis not present

## 2023-03-26 DIAGNOSIS — R161 Splenomegaly, not elsewhere classified: Secondary | ICD-10-CM | POA: Insufficient documentation

## 2023-03-26 DIAGNOSIS — I4891 Unspecified atrial fibrillation: Secondary | ICD-10-CM | POA: Insufficient documentation

## 2023-03-26 LAB — CMP (CANCER CENTER ONLY)
ALT: 17 U/L (ref 0–44)
AST: 16 U/L (ref 15–41)
Albumin: 4.8 g/dL (ref 3.5–5.0)
Alkaline Phosphatase: 34 U/L — ABNORMAL LOW (ref 38–126)
Anion gap: 6 (ref 5–15)
BUN: 17 mg/dL (ref 8–23)
CO2: 29 mmol/L (ref 22–32)
Calcium: 9.6 mg/dL (ref 8.9–10.3)
Chloride: 106 mmol/L (ref 98–111)
Creatinine: 0.91 mg/dL (ref 0.61–1.24)
GFR, Estimated: >60 mL/min (ref >60)
Glucose, Bld: 107 mg/dL — ABNORMAL HIGH (ref 70–99)
Potassium: 4.6 mmol/L (ref 3.5–5.1)
Sodium: 141 mmol/L (ref 135–145)
Total Bilirubin: 1.2 mg/dL (ref 0.0–1.2)
Total Protein: 7 g/dL (ref 6.5–8.1)

## 2023-03-26 LAB — CBC WITH DIFFERENTIAL (CANCER CENTER ONLY)
Abs Immature Granulocytes: 0.02 10*3/uL (ref 0.00–0.07)
Basophils Absolute: 0 10*3/uL (ref 0.0–0.1)
Basophils Relative: 0 %
Eosinophils Absolute: 0.1 10*3/uL (ref 0.0–0.5)
Eosinophils Relative: 1 %
HCT: 51.9 % (ref 39.0–52.0)
Hemoglobin: 15.8 g/dL (ref 13.0–17.0)
Immature Granulocytes: 0 %
Lymphocytes Relative: 73 %
Lymphs Abs: 11.6 10*3/uL — ABNORMAL HIGH (ref 0.7–4.0)
MCH: 26.7 pg (ref 26.0–34.0)
MCHC: 30.4 g/dL (ref 30.0–36.0)
MCV: 87.8 fL (ref 80.0–100.0)
Monocytes Absolute: 0.6 10*3/uL (ref 0.1–1.0)
Monocytes Relative: 4 %
Neutro Abs: 3.5 10*3/uL (ref 1.7–7.7)
Neutrophils Relative %: 22 %
Platelet Count: 119 10*3/uL — ABNORMAL LOW (ref 150–400)
RBC: 5.91 MIL/uL — ABNORMAL HIGH (ref 4.22–5.81)
RDW: 14.3 % (ref 11.5–15.5)
Smear Review: NORMAL
WBC Count: 15.9 10*3/uL — ABNORMAL HIGH (ref 4.0–10.5)
nRBC: 0.1 % (ref 0.0–0.2)

## 2023-03-26 LAB — LACTATE DEHYDROGENASE: LDH: 138 U/L (ref 98–192)

## 2023-03-26 NOTE — Progress Notes (Signed)
 Patient Care Team: [name], DO as PCP - General (Family Medicine) [name], [name], MD as PCP - Cardiology (Cardiology)  DIAGNOSIS:  Encounter Diagnosis  Name Primary?   CLL (chronic lymphocytic leukemia) (HCC) Yes      CHIEF COMPLIANT: Follow-up of CLL  HISTORY OF PRESENT ILLNESS: Rodney Burns is a 78 year old with above-mentioned history of CLL who is currently on observation.  He reports no new problems or concerns.  He has been watching his sugars.  He was diagnosed with obstructive sleep apnea and uses a CPAP machine which causes intermittent bleeding from the nose.  He is here today accompanied by his wife to discuss her treatment plan.    ALLERGIES:  is allergic to penicillins, ace inhibitors, darvocet [propoxyphene n-acetaminophen], other, and sulfa antibiotics.  MEDICATIONS:  Current Outpatient Medications  Medication Sig Dispense Refill   acetaminophen (TYLENOL) 325 MG tablet Take 325 mg by mouth every 6 (six) hours as needed for mild pain or moderate pain.     docusate sodium (COLACE) 100 MG capsule Take 100 mg by mouth 2 (two) times daily.     empagliflozin (JARDIANCE) 10 MG TABS tablet Take 1 tablet (10 mg total) by mouth daily before breakfast.     ENTRESTO 24-26 MG TAKE 1 TABLET TWICE A DAY 180 tablet 3   ezetimibe-simvastatin (VYTORIN) 10-20 MG tablet TAKE 1 TABLET AT BEDTIME (KEEP UPCOMING APPOINTMENT FOR REFILLS) 90 tablet 3   Fiber, Guar Gum, CHEW Chew 3 each by mouth daily.     fluticasone (FLONASE) 50 MCG/ACT nasal spray SHAKE LIQUID AND USE 2 SPRAYS IN EACH NOSTRIL DAILY 16 g 3   Fluticasone-Umeclidin-Vilant (TRELEGY ELLIPTA) 100-62.5-25 MCG/ACT AEPB Inhale 100 1e11 Vector Genomes into the lungs. Use 1 inhalation daily     furosemide (LASIX) 20 MG tablet TAKE 1 TABLET DAILY (MUST KEEP UPCOMING OFFICE VISIT FOR FURTHER REFILLS) 90 tablet 3   guaiFENesin (MUCINEX) 600 MG 12 hr tablet Take 600 mg by mouth as needed. (Patient not taking: Reported on 03/26/2023)      latanoprost (XALATAN) 0.005 % ophthalmic solution Place 1 drop into both eyes at bedtime.     loratadine (CLARITIN) 10 MG tablet TAKE 1 TABLET DAILY 90 tablet 3   Melatonin 5 MG CHEW Chew 5 mg by mouth at bedtime.     RABEprazole (ACIPHEX) 20 MG tablet Take 1 tablet (20 mg total) by mouth daily. 90 tablet 3   No current facility-administered medications for this visit.    PHYSICAL EXAMINATION: ECOG PERFORMANCE STATUS: 1 - Symptomatic but completely ambulatory  Vitals:   03/26/23 1023  BP: (!) 100/55  Pulse: (!) 58  Resp: 18  Temp: 98.1 F (36.7 C)  SpO2: 98%   Filed Weights   03/26/23 1023  Weight: 203 lb 9.6 oz (92.4 kg)      LABORATORY DATA:  I have reviewed the data as listed    Latest Ref Rng & Units 03/26/2023   10:00 AM 09/13/2022    8:34 AM 03/26/2022    9:20 AM  CMP  Glucose 70 - 99 mg/dL 295  98  284   BUN 8 - 23 mg/dL 17  13  19    Creatinine 0.61 - 1.24 mg/dL 1.32  4.40  1.02   Sodium 135 - 145 mmol/L 141  140  141   Potassium 3.5 - 5.1 mmol/L 4.6  4.9  4.4   Chloride 98 - 111 mmol/L 106  101  106   CO2 22 - 32 mmol/L  29  25  28    Calcium 8.9 - 10.3 mg/dL 9.6  9.3  9.4   Total Protein 6.5 - 8.1 g/dL 7.0   6.8   Total Bilirubin 0.0 - 1.2 mg/dL 1.2   1.1   Alkaline Phos 38 - 126 U/L 34   34   AST 15 - 41 U/L 16   15   ALT 0 - 44 U/L 17   14     Lab Results  Component Value Date   WBC 15.9 (H) 03/26/2023   HGB 15.8 03/26/2023   HCT 51.9 03/26/2023   MCV 87.8 03/26/2023   PLT 119 (L) 03/26/2023   NEUTROABS 3.5 03/26/2023    ASSESSMENT & PLAN:  CLL (chronic lymphocytic leukemia) (HCC) CLL stage I, lymphocytosis and lymphadenopathy diagnosed 07/31/2009, initial CT scan revealed lymphadenopathy and mildly enlarged spleen.   Lab review:  Patient had COVID-19 pneumonia: Since that infection his lymphocyte count had come down to the   Treatment plan: Observation. Lab review:  03/06/2022: WBC 18, hemoglobin 14.5, platelets 116, ALC 12.8 03/26/2022: WBC  18.1, hemoglobin 14.6, platelets 104, ALC 9.4 03/26/2023: WBC 15.9, hemoglobin 15.8, platelets 119, ALC 11.6   I discussed with him the indications for treatment would be rapid doubling time on development of severe anemia thrombocytopenia, rapidly enlarging lymphadenopathy, B symptoms like fevers chills night sweats and weight loss.   Chronic thrombocytopenia: Watchful monitoring. He has chronic atrial fibrillation   He was diagnosed with congestive heart failure and is now on diuretics.  He sees Dr. [name] with cardiology.   Return to clinic in 1 year with labs and follow-up     Orders Placed This Encounter  Procedures   CBC with Differential (Cancer Center Only)    Standing Status:   Future    Expiration Date:   03/25/2024   Lactate dehydrogenase    Standing Status:   Future    Expiration Date:   03/25/2024   CMP (Cancer Center only)    Standing Status:   Future    Expiration Date:   03/25/2024   The patient has a good understanding of the overall plan. he agrees with it. he will call with any problems that may develop before the next visit here. Total time spent: 30 mins including face to face time and time spent for planning, charting and co-ordination of care   [name], MD 03/26/23

## 2023-03-26 NOTE — Assessment & Plan Note (Signed)
 CLL stage I, lymphocytosis and lymphadenopathy diagnosed 07/31/2009, initial CT scan revealed lymphadenopathy and mildly enlarged spleen.   Lab review:  Patient had COVID-19 pneumonia: Since that infection his lymphocyte count had come down to the   Treatment plan: Observation. Lab review:  03/06/2022: WBC 18, hemoglobin 14.5, platelets 116, ALC 12.8 03/26/2022: WBC 18.1, hemoglobin 14.6, platelets 104, ALC pending   I discussed with him the indications for treatment would be rapid doubling time on development of severe anemia thrombocytopenia, rapidly enlarging lymphadenopathy, B symptoms like fevers chills night sweats and weight loss.   Chronic thrombocytopenia: Watchful monitoring. He has chronic atrial fibrillation   He was diagnosed with congestive heart failure and is now on diuretics.  He sees Dr. [name] with cardiology.   Return to clinic in 1 year with labs and follow-up

## 2023-04-22 ENCOUNTER — Other Ambulatory Visit: Payer: Self-pay | Admitting: Cardiology

## 2023-04-23 MED ORDER — ENTRESTO 24-26 MG PO TABS: 1.0000 | ORAL_TABLET | Freq: Two times a day (BID) | ORAL | 2 refills | Status: DC

## 2023-05-16 ENCOUNTER — Other Ambulatory Visit: Payer: Self-pay | Admitting: Nurse Practitioner

## 2023-05-16 DIAGNOSIS — J310 Chronic rhinitis: Secondary | ICD-10-CM

## 2023-05-19 ENCOUNTER — Other Ambulatory Visit: Payer: Self-pay | Admitting: Nurse Practitioner

## 2023-07-01 DIAGNOSIS — H35033 Hypertensive retinopathy, bilateral: Secondary | ICD-10-CM | POA: Diagnosis not present

## 2023-07-01 DIAGNOSIS — E785 Hyperlipidemia, unspecified: Secondary | ICD-10-CM | POA: Diagnosis not present

## 2023-07-01 DIAGNOSIS — I5022 Chronic systolic (congestive) heart failure: Secondary | ICD-10-CM | POA: Diagnosis not present

## 2023-07-01 DIAGNOSIS — C9110 Chronic lymphocytic leukemia of B-cell type not having achieved remission: Secondary | ICD-10-CM | POA: Diagnosis not present

## 2023-07-01 DIAGNOSIS — I10 Essential (primary) hypertension: Secondary | ICD-10-CM | POA: Diagnosis not present

## 2023-07-01 DIAGNOSIS — Z0000 Encounter for general adult medical examination without abnormal findings: Secondary | ICD-10-CM | POA: Diagnosis not present

## 2023-07-01 DIAGNOSIS — I4819 Other persistent atrial fibrillation: Secondary | ICD-10-CM | POA: Diagnosis not present

## 2023-07-01 DIAGNOSIS — J432 Centrilobular emphysema: Secondary | ICD-10-CM | POA: Diagnosis not present

## 2023-07-01 DIAGNOSIS — R7303 Prediabetes: Secondary | ICD-10-CM | POA: Diagnosis not present

## 2023-07-01 DIAGNOSIS — D696 Thrombocytopenia, unspecified: Secondary | ICD-10-CM | POA: Diagnosis not present

## 2023-07-01 DIAGNOSIS — K439 Ventral hernia without obstruction or gangrene: Secondary | ICD-10-CM | POA: Diagnosis not present

## 2023-07-01 DIAGNOSIS — Z23 Encounter for immunization: Secondary | ICD-10-CM | POA: Diagnosis not present

## 2023-07-03 ENCOUNTER — Other Ambulatory Visit: Payer: Self-pay | Admitting: Cardiology

## 2023-07-03 DIAGNOSIS — I4821 Permanent atrial fibrillation: Secondary | ICD-10-CM

## 2023-07-03 NOTE — Telephone Encounter (Signed)
 Prescription refill request for Xarelto  received.  Indication: afib  Last office visit: Rodney Burns 11/26/2022 Weight: 92.4 kg  Age: 78 yo  Scr: 1.05, 03/26/2022 CrCl: 76 ml/min   Pt is overdue for blood work but is coming in on 6/24 to see Dr. [name]. Put on appointment note that pt will need blood work for Xarelto  follow up

## 2023-07-09 ENCOUNTER — Ambulatory Visit: Payer: Self-pay | Admitting: Pulmonary Disease

## 2023-07-10 DIAGNOSIS — H40013 Open angle with borderline findings, low risk, bilateral: Secondary | ICD-10-CM | POA: Diagnosis not present

## 2023-07-12 NOTE — Progress Notes (Unsigned)
 Cardiology Office Note:    Date:  07/15/2023   ID:  MATTIAS Burns, DOB 1945/04/20, MRN [id number]  PCP:  Dayna Motto, DO  [facility] Cardiologist:  Jairy Angulo [name], MD  [facility] Electrophysiologist:  None   Referring MD: Dayna Motto, DO   Chief Complaint  Patient presents with   Atrial Fibrillation    History of Present Illness:    Rodney Burns is a 78 y.o. male with a hx of atrial flutter, HTN, HLD, CAD s/p CABG 1994, pericarditis in January 2070, chronic systolic heart failure, OSA on CPAP, CLL and thoracic aortic aneurysm.  He had radiofrequency ablation in 2011 of atrial tachycardia/flutter however had recurrent atrial flutter after the ablation. He had repeat ablation of another atrial tachy/flutter in 2013 by Dr Kelsie.   Myoview  in March 2016 showed inferior and anteroapical infarct without ischemia, EF 32%.  Echocardiogram obtained in January 2020 due to pericarditis showed EF 40 to 45%, hypokinesis of the anteroseptal and inferoseptal myocardium, trivial AI, mild MR.  Repeat echocardiogram in October 2020 showed EF 40 to 45%, hypokinesis of the anteroseptal and inferoseptal myocardium, grade 1 DD, mild MR, 40 mm aortic root dilatation.    He was tested positive for COVID-19 in December 2020 and has had a significant acute hypoxic respiratory failure.  He also had a significant transaminitis and was unable to get remdesivir or Actemra.  Toprol -XL was increased and he was given IV digoxin .  CTA January 2021 showed pulmonary fibrosis.  He has been able to wean off of oxygen .  He also completed pulmonary rehab.  Repeat echocardiogram obtained in March 2021 showed EF 35 to 40%, global hypokinesis, normal pulmonary artery systolic pressure, trivial AI, mild dilatation of the aortic root measuring at 37mm.  Spironolactone  was added to his medical regimen at the time.  Note, he was in atrial flutter during the echocardiogram even though EKG prior to that in December 2020 showed a sinus  rhythm. When seen in October 2021, EKG showed he was back in sinus rhythm.  He was also having symptomatic orthostatic hypotension at the time and bradycardia, spironolactone  was decreased to 12.5 mg daily, Toprol -XL decreased to 50 mg daily. When seen last year digoxin  was discontinued. He was felt to be in NSR at that time but no Ecg done.  He was seen by me in February 2023, at which time he complained of dizziness and lightheadedness.  He gained about 8 pounds.  proBNP was elevated at 638.  Spironolactone  was increased to 25 mg daily.  Lasix  20 mg daily was added to his medical regimen.  EKG showed the patient was in controlled atrial fibrillation which may have contributed to his symptoms.  Outpatient cardioversion was scheduled for 3/16 however later canceled as patient arrived in sinus rhythm.  Repeat echocardiogram obtained on 03/22/2021 demonstrated EF 35%, global hypokinesis, mild MR, dilated aortic root at 40 mm.  When see in follow up he was bradycardic so a heart monitor was placed. This showed recurrent Afib with slow rates at times so beta blocker was reduced. Consideration for AAD limited to Tikosyn due to co-morbidities.   He states he is doing well. Had recent  follow up CT chest for his pulmonary fibrosis. No change from June. Dyspnea really hasn't changed. Still gets SOB with activity. Not aware of Afib at all. No edema. Weight is stable. Has a new CPAP and reports he is sleeping better 5-6 hours a night. Had dental extraction this week.  Was seen last year with bradycardia and ultimately metoprolol  was discontinued. Was in and out of Afib. Jardiance  was added.   He reports he is doing well. Is aware his heart is out of rhythm at times. No chest pain. States his breathing is doing good. No edema.     Past Medical History:  Diagnosis Date   Atrial tachycardia (HCC)    a. s/p DCCV 09/2009; b. s/p RFCA 03/2011; c. s/p DCCV 04/2011; d. s/p RFCA 09/17/11.   Cataract    Chronic systolic CHF  (congestive heart failure) (HCC)    a. 01/2015 Echo: EF 40-45%, antsept/infsept HK, triv AI, mild MR, mod dil LA, nl RV, mildly dil RA.   CLL (chronic lymphoblastic leukemia)    Stage 0-1   Coronary artery disease involving native coronary artery without angina pectoris    a. Status post CABG in 1994:By Dr. Lucas. LIMA - L Cx, seqSVG-Diag-LAD, and SVG-PDA-PL.   Cough    Consistant with ACE inhibitor mediated cough   Dysrhythmia    Glaucoma (increased eye pressure) 1991   History of tobacco abuse    quit 1994   Hypercholesterolemia    Well Controlled   Hypertension    Ischemic cardiomyopathy    a. 02/2011 Echo: EF 40-45%;  b. 01/2015 Echo: EF 40-45%.   Malaria 1972   Hx of   Pericarditis    a. 01/2015-->Treated w/ ibuprofen /colchicine .   Sleep-disordered breathing 06/20/2011    Past Surgical History:  Procedure Laterality Date   ATRIAL FLUTTER ABLATION N/A 04/11/2011   Procedure: ATRIAL FLUTTER ABLATION;  Surgeon: Elspeth JAYSON Sage, MD;  Location: [facility] CATH LAB;  Service: Cardiovascular;  Laterality: N/A;   ATRIAL FLUTTER ABLATION N/A 09/17/2011   Procedure: ATRIAL FLUTTER ABLATION;  Surgeon: Lynwood Rakers, MD;  Location: [facility] CATH LAB;  Service: Cardiovascular;  Laterality: N/A;   CARDIOVERSION  06/03/2011   Procedure: CARDIOVERSION;  Surgeon: Elspeth JAYSON Sage, MD;  Location: [facility] OR;  Service: Cardiovascular;  Laterality: N/A;   CATARACT EXTRACTION     COLONOSCOPY WITH PROPOFOL  N/A 09/26/2021   Procedure: COLONOSCOPY WITH PROPOFOL ;  Surgeon: Burnette Fallow, MD;  Location: [facility] ENDOSCOPY;  Service: Gastroenterology;  Laterality: N/A;   CORONARY ARTERY BYPASS GRAFT  1994   By Dr. Lucas. LIMA - L Cx, seqSVG-Diag-LAD, and SVG-PDA-PL   ELECTROPHYSIOLOGY STUDY N/A 04/11/2011   Procedure: ELECTROPHYSIOLOGY STUDY;  Surgeon: Elspeth JAYSON Sage, MD;  Location: [facility] CATH LAB;  Service: Cardiovascular;  Laterality: N/A;   EP study and ablation  2013   by Dr Sage, repeat ablation by Dr Rakers   NM MYOVIEW  LTD   03/2014   scar in the inferior and anteroapical regions without ischemia. This is similar to finding on Myoview  in 2013. EF is a little lower 39%>>32%.   TRIGGER FINGER RELEASE  12/2016   left hand    US  ECHOCARDIOGRAPHY  09-07-09; 02/2011   a. Est EF 40-45%; b. EF 40-45%. Gr 2 DD. Mild LA dil    Current Medications: Current Meds  Medication Sig   acetaminophen  (TYLENOL ) 325 MG tablet Take 325 mg by mouth every 6 (six) hours as needed for mild pain or moderate pain.   docusate sodium  (COLACE) 100 MG capsule Take 100 mg by mouth 2 (two) times daily.   empagliflozin  (JARDIANCE ) 10 MG TABS tablet Take 1 tablet (10 mg total) by mouth daily before breakfast.   ezetimibe -simvastatin  (VYTORIN ) 10-20 MG tablet TAKE 1 TABLET AT BEDTIME (KEEP UPCOMING APPOINTMENT FOR REFILLS)   Fiber, Guar  Gum, CHEW Chew 3 each by mouth daily.   fluticasone  (FLONASE ) 50 MCG/ACT nasal spray USE 2 SPRAYS IN EACH NOSTRIL DAILY   Fluticasone -Umeclidin-Vilant (TRELEGY ELLIPTA ) 100-62.5-25 MCG/ACT AEPB Inhale 100 1e11 Vector Genomes into the lungs. Use 1 inhalation daily   furosemide  (LASIX ) 20 MG tablet TAKE 1 TABLET DAILY (MUST KEEP UPCOMING OFFICE VISIT FOR FURTHER REFILLS)   guaiFENesin  (MUCINEX ) 600 MG 12 hr tablet Take 600 mg by mouth as needed.   latanoprost  (XALATAN ) 0.005 % ophthalmic solution Place 1 drop into both eyes at bedtime.   loratadine  (CLARITIN ) 10 MG tablet TAKE 1 TABLET DAILY   Melatonin 5 MG CHEW Chew 5 mg by mouth at bedtime.   RABEprazole  (ACIPHEX ) 20 MG tablet Take 1 tablet (20 mg total) by mouth daily.   rivaroxaban  (XARELTO ) 20 MG TABS tablet TAKE 1 TABLET DAILY WITH SUPPER   sacubitril -valsartan  (ENTRESTO ) 24-26 MG Take 1 tablet by mouth 2 (two) times daily.     Allergies:   Penicillins, Ace inhibitors, Darvocet [propoxyphene n-acetaminophen ], Other, and Sulfa antibiotics   Social History   Socioeconomic History   Marital status: Married    Spouse name: Not on file   Number of children: 2    Years of education: Not on file   Highest education level: Not on file  Occupational History   Occupation: Transport planner    Employer: RETIRED  Tobacco Use   Smoking status: Former    Current packs/day: 0.00    Average packs/day: 2.0 packs/day for 30.0 years (60.0 ttl pk-yrs)    Types: Cigarettes    Start date: 01/21/1962    Quit date: 01/22/1992    Years since quitting: 31.4   Smokeless tobacco: Never  Vaping Use   Vaping status: Never Used  Substance and Sexual Activity   Alcohol use: No   Drug use: No   Sexual activity: Yes  Other Topics Concern   Not on file  Social History Narrative   Not on file   Social Drivers of Health   Financial Resource Strain: Not on file  Food Insecurity: Not on file  Transportation Needs: Not on file  Physical Activity: Not on file  Stress: Not on file  Social Connections: Not on file     Family History: The patient's family history includes Cancer in his brother; Down syndrome in his son; Heart failure in his father.  ROS:   Please see the history of present illness.     All other systems reviewed and are negative.  EKGs/Labs/Other Studies Reviewed:    The following studies were reviewed today:  Echo 04/14/2019 1. Left ventricular ejection fraction, by estimation, is 35 to 40%. The  left ventricle has moderately decreased function. The left ventricle  demonstrates global hypokinesis. Left ventricular diastolic parameters are  indeterminate.   2. Right ventricular systolic function is mildly reduced. The right  ventricular size is normal. There is normal pulmonary artery systolic  pressure. The estimated right ventricular systolic pressure is 27.2 mmHg.   3. The mitral valve is normal in structure. Trivial mitral valve  regurgitation. No evidence of mitral stenosis.   4. The aortic valve is tricuspid. Aortic valve regurgitation is trivial.  Mild aortic valve sclerosis is present, with no evidence of aortic valve  stenosis.   5.  Aortic dilatation noted. There is mild dilatation of the aortic root  measuring 37 mm.   6. The inferior vena cava is normal in size with greater than 50%  respiratory variability, suggesting right  atrial pressure of 3 mmHg.   7. The patient was in atrial flutter.   Echo 03/22/21: IMPRESSIONS Left ventricular ejection fraction, by estimation, is 35%. The left ventricle has moderately decreased function. The left ventricle demonstrates global hypokinesis. The left ventricular internal cavity size was mildly dilated. Left ventricular diastolic parameters are indeterminate. 1. Right ventricular systolic function is normal. The right ventricular size is mildly enlarged. Tricuspid regurgitation signal is inadequate for assessing PA pressure. 2. 3. Left atrial size was mildly dilated. 4. Right atrial size was mildly dilated. The mitral valve is normal in structure. Mild mitral valve regurgitation. No evidence of mitral stenosis. 5. The aortic valve is tricuspid. There is mild calcification of the aortic valve. Aortic valve regurgitation is trivial. Aortic valve sclerosis/calcification is present, without any evidence of aortic stenosis. 6. 7. Aortic dilatation noted. There is mild dilatation of the aortic root, measuring 40 mm. The inferior vena cava is dilated in size with >50% respiratory variability, suggesting right atrial pressure of 8 mmHg. 8. 9. The patient was in atrial fibrillation.  EKG Interpretation Date/Time:  Tuesday July 15 2023 14:31:41 EDT Ventricular Rate:  70 PR Interval:    QRS Duration:  112 QT Interval:  390 QTC Calculation: 421 R Axis:   77  Text Interpretation: Atrial fibrillation with premature ventricular or aberrantly conducted complexes Cannot rule out Inferior infarct (cited on or before 15-Jul-2023) Anterior infarct (cited on or before 15-Jul-2023) When compared with ECG of 14-Aug-2022 11:13, Atrial fibrillation has replaced Sinus rhythm Vent. rate has  increased BY  27 BPM Nonspecific T wave abnormality now evident in Inferior leads Confirmed by [name], Crystal Ellwood [phone]) on 07/15/2023 2:33:57 PM   Recent Labs: 03/26/2023: ALT 17; BUN 17; Creatinine 0.91; Hemoglobin 15.8; Platelet Count 119; Potassium 4.6; Sodium 141  Recent Lipid Panel    Component Value Date/Time   CHOL 108 10/16/2017 1435   TRIG 93 12/24/2018 1134   HDL 42 10/16/2017 1435   LDLCALC 55 10/16/2017 1435   Dated 10/23/20: cholesterol 111, triglycerides 46, HDL 45, LDL 55. Dated 05/30/21: A1c 6.3%. cholesterol 105, triglycerides 58, HDL 38, LDL 54. CMET and CBC normal.  Risk Assessment/Calculations:       Physical Exam:    VS:  BP (!) 90/56   Pulse 92   Ht 5' 10 (1.778 m)   Wt 197 lb (89.4 kg)   SpO2 96%   BMI 28.27 kg/m     Wt Readings from Last 3 Encounters:  07/15/23 197 lb (89.4 kg)  03/26/23 203 lb 9.6 oz (92.4 kg)  11/26/22 203 lb (92.1 kg)     GEN:  Well nourished, well developed in no acute distress HEENT: Normal NECK: No JVD; No carotid bruits LYMPHATICS: No lymphadenopathy CARDIAC: IRRR, no murmurs, rubs, gallops RESPIRATORY:  Clear to auscultation without rales, wheezing or rhonchi  ABDOMEN: Soft, non-tender, non-distended MUSCULOSKELETAL:  No edema; No deformity  SKIN: Warm and dry NEUROLOGIC:  Alert and oriented x 3 PSYCHIATRIC:  Normal affect   ASSESSMENT:    1. Paroxysmal atrial fibrillation (HCC)   2. Permanent atrial fibrillation (HCC)   3. Coronary artery disease involving coronary bypass graft of native heart without angina pectoris   4. Chronic systolic CHF (congestive heart failure) (HCC)        PLAN:    In order of problems listed above:  Atrial fibrillation with controlled response. Paroxysmal.no longer on metoprolol  due to bradycardia. On Xarelto . Continue current therapy.  Chronic combined systolic/diastolic CHF. EF 35%. On  Entresto  and  Jardiance . Unable to titrate with low BP.  Continue lasix  20 mg daily. No beta  blocker due to bradycardia. Appears euvolemic today.   CAD s/p remote CABG in 1994. No active angina.   Hyperlipidemia. On statin. Reports labs done last week with PCP  Remote atrial flutter/tachycardia s/p ablation x 2.   6.   HTN controlled.      Follow up in 6 months.    Medication Adjustments/Labs and Tests Ordered: Current medicines are reviewed at length with the patient today.  Concerns regarding medicines are outlined above.  Orders Placed This Encounter  Procedures   EKG 12-Lead   No orders of the defined types were placed in this encounter.   There are no Patient Instructions on file for this visit.   Signed, Korissa Horsford [name], MD  07/15/2023 2:51 PM    [facility]

## 2023-07-15 ENCOUNTER — Encounter: Payer: Self-pay | Admitting: Cardiology

## 2023-07-15 ENCOUNTER — Ambulatory Visit: Attending: Cardiology | Admitting: Cardiology

## 2023-07-15 VITALS — BP 90/56 | HR 92 | Ht 70.0 in | Wt 197.0 lb

## 2023-07-15 DIAGNOSIS — I5022 Chronic systolic (congestive) heart failure: Secondary | ICD-10-CM | POA: Insufficient documentation

## 2023-07-15 DIAGNOSIS — I480 Paroxysmal atrial fibrillation: Secondary | ICD-10-CM | POA: Diagnosis not present

## 2023-07-15 DIAGNOSIS — I25810 Atherosclerosis of coronary artery bypass graft(s) without angina pectoris: Secondary | ICD-10-CM | POA: Insufficient documentation

## 2023-07-15 DIAGNOSIS — I4821 Permanent atrial fibrillation: Secondary | ICD-10-CM | POA: Insufficient documentation

## 2023-07-15 NOTE — Patient Instructions (Signed)
 Medication Instructions:  Continue same medications *If you need a refill on your cardiac medications before your next appointment, please call your pharmacy*  Lab Work:  None ordered  Testing/Procedures: None ordered  Follow-Up: At [facility], you and your health needs are our priority.  As part of our continuing mission to provide you with exceptional heart care, our providers are all part of one team.  This team includes your primary Cardiologist (physician) and Advanced Practice Providers or APPs (Physician Assistants and Nurse Practitioners) who all work together to provide you with the care you need, when you need it.  Your next appointment:  6 months   Call in August to schedule Dec appointment     Provider:  Dr.Jordan   We recommend signing up for the patient portal called MyChart.  Sign up information is provided on this After Visit Summary.  MyChart is used to connect with patients for Virtual Visits (Telemedicine).  Patients are able to view lab/test results, encounter notes, upcoming appointments, etc.  Non-urgent messages can be sent to your provider as well.   To learn more about what you can do with MyChart, go to [email or web address].

## 2023-07-22 ENCOUNTER — Other Ambulatory Visit: Payer: Self-pay

## 2023-07-22 MED ORDER — EMPAGLIFLOZIN 10 MG PO TABS: 10.0000 mg | ORAL_TABLET | Freq: Every day | ORAL | 3 refills | Status: AC

## 2023-07-30 ENCOUNTER — Other Ambulatory Visit: Payer: Self-pay | Admitting: Nurse Practitioner

## 2023-07-30 NOTE — Telephone Encounter (Signed)
 Copied from CRM [id number]. Topic: Clinical - Medication Refill >> Jul 30, 2023  2:34 PM Isabell A wrote: Medication: Fluticasone -Umeclidin-Vilant (TRELEGY ELLIPTA ) 100-62.5-25 MCG/ACT AEPB  Has the patient contacted their pharmacy? Yes (Agent: If no, request that the patient contact the pharmacy for the refill. If patient does not wish to contact the pharmacy document the reason why and proceed with request.) (Agent: If yes, when and what did the pharmacy advise?)  This is the patient's preferred pharmacy:  [facility] - [place], [place] - [address] [place] [address] Phone: [phone] Fax: [phone]  Is this the correct pharmacy for this prescription? Yes If no, delete pharmacy and type the correct one.   Has the prescription been filled recently? Yes  Is the patient out of the medication? No  Has the patient been seen for an appointment in the last year OR does the patient have an upcoming appointment? Yes  Can we respond through MyChart? No  Agent: Please be advised that Rx refills may take up to 3 business days. We ask that you follow-up with your pharmacy.

## 2023-08-12 DIAGNOSIS — L820 Inflamed seborrheic keratosis: Secondary | ICD-10-CM | POA: Diagnosis not present

## 2023-08-12 DIAGNOSIS — Z129 Encounter for screening for malignant neoplasm, site unspecified: Secondary | ICD-10-CM | POA: Diagnosis not present

## 2023-08-12 DIAGNOSIS — L821 Other seborrheic keratosis: Secondary | ICD-10-CM | POA: Diagnosis not present

## 2023-08-12 DIAGNOSIS — D1801 Hemangioma of skin and subcutaneous tissue: Secondary | ICD-10-CM | POA: Diagnosis not present

## 2023-08-12 DIAGNOSIS — Z85828 Personal history of other malignant neoplasm of skin: Secondary | ICD-10-CM | POA: Diagnosis not present

## 2023-08-22 NOTE — Procedures (Signed)
Mask fit

## 2023-09-30 ENCOUNTER — Ambulatory Visit: Admitting: Nurse Practitioner

## 2023-10-01 ENCOUNTER — Other Ambulatory Visit: Payer: Self-pay | Admitting: Cardiology

## 2023-10-01 DIAGNOSIS — I4821 Permanent atrial fibrillation: Secondary | ICD-10-CM

## 2023-10-01 NOTE — Telephone Encounter (Signed)
 Prescription refill request for Xarelto  received.  Indication:afib Last office visit:6/25 Weight:89.4  kg Age:78 Scr:0.91  3/25 CrCl:83.23  ml/min  Prescription refilled

## 2023-10-02 ENCOUNTER — Other Ambulatory Visit: Payer: Self-pay | Admitting: Nurse Practitioner

## 2023-10-02 ENCOUNTER — Telehealth: Payer: Self-pay | Admitting: Cardiology

## 2023-10-02 DIAGNOSIS — J310 Chronic rhinitis: Secondary | ICD-10-CM

## 2023-10-02 MED ORDER — SACUBITRIL-VALSARTAN 24-26 MG PO TABS: 1.0000 | ORAL_TABLET | Freq: Two times a day (BID) | ORAL | 2 refills | Status: AC

## 2023-10-02 NOTE — Telephone Encounter (Signed)
*  STAT* If patient is at the pharmacy, call can be transferred to refill team.   1. Which medications need to be refilled? (please list name of each medication and dose if known)   sacubitril -valsartan  (ENTRESTO ) 24-26 MG   LOCAL PHARMACY   4. Which pharmacy/location (including street and city if local pharmacy) is medication to be sent to?  [id number] - [place], [place] - [address] AT [facility] [address] Phone: [phone]  Fax: [phone]       5. Do they need a 30 day or 90 day supply? 14 TABLETS

## 2023-10-02 NOTE — Telephone Encounter (Signed)
 RX sent in

## 2023-10-14 ENCOUNTER — Ambulatory Visit (INDEPENDENT_AMBULATORY_CARE_PROVIDER_SITE_OTHER): Admitting: Nurse Practitioner

## 2023-10-14 ENCOUNTER — Ambulatory Visit (INDEPENDENT_AMBULATORY_CARE_PROVIDER_SITE_OTHER)

## 2023-10-14 ENCOUNTER — Encounter: Payer: Self-pay | Admitting: Nurse Practitioner

## 2023-10-14 VITALS — BP 90/52 | HR 89 | Ht 70.0 in | Wt 190.0 lb

## 2023-10-14 DIAGNOSIS — R053 Chronic cough: Secondary | ICD-10-CM

## 2023-10-14 DIAGNOSIS — J432 Centrilobular emphysema: Secondary | ICD-10-CM

## 2023-10-14 DIAGNOSIS — J849 Interstitial pulmonary disease, unspecified: Secondary | ICD-10-CM | POA: Diagnosis not present

## 2023-10-14 DIAGNOSIS — G4733 Obstructive sleep apnea (adult) (pediatric): Secondary | ICD-10-CM | POA: Diagnosis not present

## 2023-10-14 DIAGNOSIS — J310 Chronic rhinitis: Secondary | ICD-10-CM | POA: Diagnosis not present

## 2023-10-14 DIAGNOSIS — R059 Cough, unspecified: Secondary | ICD-10-CM | POA: Diagnosis not present

## 2023-10-14 MED ORDER — ALBUTEROL SULFATE HFA 108 (90 BASE) MCG/ACT IN AERS: 2.0000 | INHALATION_SPRAY | Freq: Four times a day (QID) | RESPIRATORY_TRACT | 3 refills | Status: AC | PRN

## 2023-10-14 MED ORDER — TRELEGY ELLIPTA 100-62.5-25 MCG/ACT IN AEPB
1.0000 | INHALATION_SPRAY | Freq: Every day | RESPIRATORY_TRACT | 11 refills | Status: DC
Start: 2023-10-14 — End: 2023-10-24

## 2023-10-14 NOTE — Patient Instructions (Addendum)
 Continue BiPAP nightly. Wear for a minimum of 4-6 hours a night. Adjusted your BiPAP settings down today. Hopefully this helps with the leaks, control of your events and sinus congestion  We discussed how untreated sleep apnea puts an individual at risk for cardiac arrhthymias, pulm HTN, DM, stroke and increases their risk for daytime accidents.    -Continue Trelegy 1 puff daily. Brush tongue and rinse mouth afterwards.  -Continue Albuterol  inhaler 2 puffs every 6 hours as needed for shortness of breath or wheezing. Notify if symptoms persist despite rescue inhaler/neb use. -Continue Claritin  10 mg daily -Restart Flonase  2 sprays each nostril daily  -Mucinex  600 mg Twice daily for chest congestion and nasal drainage  Chest x ray   Call if cough doesn't get better   Follow up in January 2026 with repeat PFTs and after HRCT, and then office visit with Dr. Theophilus. If symptoms worsen, please contact office for sooner follow up or seek emergency care.

## 2023-10-14 NOTE — Progress Notes (Signed)
 @Patient  ID: Rodney Burns, male    DOB: [birth year], 78 y.o.   MRN: [id number]  Chief Complaint  Patient presents with   Medical Management of Chronic Issues    PT states refills, Bipap well     Referring provider: Dayna Motto, DO  HPI: 78 year old male, former smoker (60-pack-year history) followed for OSA on BiPAP, emphysema and interstitial pulmonary disease (probable UIP) post-COVID.  He is followed by Dr. Theophilus for ILD and Dr. Neda for OSA.  Last seen in office 05/31/2022.  Past medical history significant for hypertension, ischemic cardiomyopathy, pericarditis, A-fib on Xarelto , chronic systolic CHF, HLD, CLL, history of CABG x5.  He is followed by Dr. Gudena for monitoring of CLL.  TEST/EVENTS:  05/05/2019 CPAP titration: AHI 22.8/h, SPO2 low 85%.  Optimal setting 14 cmH2O.  Moderate obstructive sleep apnea. 10/19/2019 PFTs: FVC 4.01 (91), FEV1 3.08 (96), ratio 77, TLC 84%, DLCO uncorrected 55%.  No BD.  Overall normal spirometry with moderately severe diffusion defect. 12/06/2020 HRCT: There are coronary artery calcifications status post CABG.  Atherosclerosis is present.  Numerous nonenlarged mediastinal nodes, unchanged.  Mild bilateral air trapping.  Centrilobular and paraseptal emphysema.  Subpleural predominant reticular and groundglass opacities within mid and lower lung predominance.  There is mild traction bronchiolectasis.  Stable solid pulmonary nodule right lower lobe 6 mm, likely fibrosis.  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 05/07/2021 PFTs: FVC 91, FEV1 94, ratio 75, TLC 80, DLCO corrected for alveolar volume 55%.  No significant BD; did have some mid flow reversibility.  Overall normal spirometry with stable diffusion defect 05/16/2021 CPAP titration: AHI 12.7 on CPAP; transition to BiPAP therapy.  Appropriate setting of 22/18 cmH2O with residual AHI 0.8 06/27/2021 HRCT chest: Atherosclerosis.  Status post median sternotomy with CABG including lima to left  circumflex territory.  Mild dilatation of the pulmonic trunk.  No LAD.  Widespread areas of groundglass attenuation, septal thickening, subpleural reticulation and peripheral bronchiolectasis are noted throughout the lungs bilaterally.  Findings have mild craniocaudal gradient.  No definite progression compared to the prior study.  There is mild centrilobular and paraseptal emphysema.  No evidence of honeycombing.  Probable UIP. 01/01/2023 HRCT chest: atherosclerosis. S/p CABG. Emphysema. Mild fibrosis, unchanged. Characteristic of chronic sequelae of COVID airspace disease. Trace left effusion.   11/28/2021: Ov with Rodney Pinn NP Patient presents today for follow up. After our last visit, he went for a mask fitting; tried a different full face mask but leaks seemed to be worse. He is back on his Dreamwear mask. Seems to have better seal and significantly less leaks on his download today. He is feeling well. Sleeping well at night and wakes feeling much more rested. He's getting at least 2 hours more of sleep. Feels like it has improved his stamina/activity tolerance during the day as well. Breathing feels better with Trelegy inhaler. Still gets short winded with longer distances or climbing. Able to do things around the house without difficulty. Denies increased cough or chest congestion. No morning headaches or drowsy driving.  10/29/2021-11/27/2021 BiPAP Auto IPAP max 22; EPAP min 12; PS 4 30/30 days; 100% >4 hr; av usage 7 hr 21 min Leaks median 1.6, 95th 22.2 AHI 1.2  05/31/2022: OV with Dr. Windle Garter change to Trelegy previously with improvement. No new complaints.  10/14/2023: Today - follow up Discussed the use of AI scribe software for clinical note transcription with the patient, who gave verbal consent to proceed.  History of Present Illness Rodney Burns is  a 78 year old male with sleep apnea and atrial fibrillation who presents with issues related to BiPAP therapy and fatigue.  He experiences  significant leaks with his BiPAP machine, which has been a persistent issue. He has lost about twenty pounds since his last visit. He currently uses a mask that covers only under his nose, as a previous mask caused discomfort and bleeding due to pressure on his teeth. No snoring or mouth breathing is noted.  He sleeps approximately six to six and a half hours per night with the BiPAP and feels rested, although he is more tired than before. He attributes some of his fatigue to his medication, specifically Jardiance . He's also in chronic a fib. No palpitations, CP, syncope. Fatigue occurs after exertion, such as walking thirty yards from his house to his shop sometimes. No issues with breathing necessarily.   He has a history of atrial fibrillation and is on a blood thinner and rhythm control medication.   He reports coughing up thick, clear phlegm in the mornings for the past two to three months, which he attributes to postnasal drainage. He uses Flonase  nasal spray as needed but not daily. It does seem to help. No fever, hemoptysis, or changes in the color of the phlegm are noted. No wheezing or chest congestion.   He does not currently use a rescue inhaler. He is on Trelegy for his lung condition.    Allergies  Allergen Reactions   Penicillins Other (See Comments)    Put pt in hospital 50 yrs ago   Ace Inhibitors Cough   Darvocet [Propoxyphene N-Acetaminophen ] Rash    Tolerates tylenol    Other Rash   Sulfa Antibiotics Rash    Immunization History  Administered Date(s) Administered    sv, Bivalent, Protein Subunit Rsvpref,pf (Abrysvo) 11/23/2021   Fluad Quad(high Dose 65+) 10/23/2020, 11/23/2021   INFLUENZA, HIGH DOSE SEASONAL PF 10/27/2013, 11/03/2014, 11/23/2015, 10/26/2016, 09/28/2018   Influenza Split 11/23/2015   Influenza,inj,Quad PF,6+ Mos 11/07/2010, 10/22/2012, 10/23/2020   Influenza-Unspecified 11/21/2013, 11/04/2016   PFIZER(Purple Top)SARS-COV-2 Vaccination 04/02/2019,  04/22/2019, 09/21/2019   Pneumococcal Conjugate-13 02/23/2016   Pneumococcal Polysaccharide-23 11/18/2011   Tdap 03/05/2011    Past Medical History:  Diagnosis Date   Atrial tachycardia    a. s/p DCCV 09/2009; b. s/p RFCA 03/2011; c. s/p DCCV 04/2011; d. s/p RFCA 09/17/11.   Cataract    Chronic systolic CHF (congestive heart failure) (HCC)    a. 01/2015 Echo: EF 40-45%, antsept/infsept HK, triv AI, mild MR, mod dil LA, nl RV, mildly dil RA.   CLL (chronic lymphoblastic leukemia)    Stage 0-1   Coronary artery disease involving native coronary artery without angina pectoris    a. Status post CABG in 1994:By Dr. Lucas. LIMA - L Cx, seqSVG-Diag-LAD, and SVG-PDA-PL.   Cough    Consistant with ACE inhibitor mediated cough   Dysrhythmia    Glaucoma (increased eye pressure) 1991   History of tobacco abuse    quit 1994   Hypercholesterolemia    Well Controlled   Hypertension    Ischemic cardiomyopathy    a. 02/2011 Echo: EF 40-45%;  b. 01/2015 Echo: EF 40-45%.   Malaria 1972   Hx of   Pericarditis    a. 01/2015-->Treated w/ ibuprofen /colchicine .   Sleep-disordered breathing 06/20/2011    Tobacco History: Social History   Tobacco Use  Smoking Status Former   Current packs/day: 0.00   Average packs/day: 2.0 packs/day for 30.0 years (60.0 ttl pk-yrs)   Types:  Cigarettes   Start date: 01/21/1962   Quit date: 01/22/1992   Years since quitting: 31.7  Smokeless Tobacco Never   Counseling given: Not Answered   Outpatient Medications Prior to Visit  Medication Sig Dispense Refill   acetaminophen  (TYLENOL ) 325 MG tablet Take 325 mg by mouth every 6 (six) hours as needed for mild pain or moderate pain.     docusate sodium  (COLACE) 100 MG capsule Take 100 mg by mouth 2 (two) times daily.     empagliflozin  (JARDIANCE ) 10 MG TABS tablet Take 1 tablet (10 mg total) by mouth daily before breakfast. 90 tablet 3   ezetimibe -simvastatin  (VYTORIN ) 10-20 MG tablet TAKE 1 TABLET AT BEDTIME (KEEP  UPCOMING APPOINTMENT FOR REFILLS) 90 tablet 3   Fiber, Guar Gum, CHEW Chew 3 each by mouth daily.     fluticasone  (FLONASE ) 50 MCG/ACT nasal spray USE 2 SPRAYS IN EACH NOSTRIL DAILY 16 g 11   furosemide  (LASIX ) 20 MG tablet TAKE 1 TABLET DAILY (MUST KEEP UPCOMING OFFICE VISIT FOR FURTHER REFILLS) 90 tablet 3   guaiFENesin  (MUCINEX ) 600 MG 12 hr tablet Take 600 mg by mouth as needed.     ketoconazole (NIZORAL) 2 % cream Apply topically 2 (two) times daily.     latanoprost  (XALATAN ) 0.005 % ophthalmic solution Place 1 drop into both eyes at bedtime.     loratadine  (CLARITIN ) 10 MG tablet TAKE 1 TABLET DAILY 90 tablet 3   Melatonin 5 MG CHEW Chew 5 mg by mouth at bedtime.     RABEprazole  (ACIPHEX ) 20 MG tablet Take 1 tablet (20 mg total) by mouth daily. 90 tablet 3   sacubitril -valsartan  (ENTRESTO ) 24-26 MG Take 1 tablet by mouth 2 (two) times daily. 180 tablet 2   XARELTO  20 MG TABS tablet TAKE 1 TABLET DAILY WITH SUPPER 90 tablet 3   Fluticasone -Umeclidin-Vilant (TRELEGY ELLIPTA ) 100-62.5-25 MCG/ACT AEPB Inhale 100 1e11 Vector Genomes into the lungs. Use 1 inhalation daily     No facility-administered medications prior to visit.     Review of Systems:   Constitutional: No weight loss or gain, night sweats, fevers, chills +fatigue (baseline) HEENT: No headaches, difficulty swallowing, tooth/dental problems, or sore throat. No sneezing, itching, ear ache, nasal congestion, or post nasal drip CV:  +occasional BLE edema (chronic, unchanged). No chest pain, orthopnea, PND, anasarca, dizziness, palpitations, syncope Resp: +shortness of breath with exertion (baseline); productive AM cough (clear). No excess mucus or change in color of mucus. No hemoptysis. No wheezing.  No chest wall deformity GI:  No heartburn, indigestion Skin: No rash, lesions, ulcerations MSK:  No joint pain or swelling.   Neuro: No dizziness or lightheadedness.  Psych: No depression or anxiety. Mood stable.     Physical  Exam:  BP (!) 90/52   Pulse 89   Ht 5' 10 (1.778 m)   Wt 190 lb (86.2 kg)   SpO2 96%   BMI 27.26 kg/m   GEN: Pleasant, interactive, well-appearing; obese; in no acute distress HEENT:  Normocephalic and atraumatic. PERRLA. Sclera white. Nasal turbinates pink, moist and patent bilaterally. No rhinorrhea present. Oropharynx pink and moist, without exudate or edema. No lesions, ulcerations, or postnasal drip.  NECK:  Supple w/ fair ROM. No JVD present. No lymphadenopathy.   CV: Bradycardic, regular rhythm, no m/r/g, no peripheral edema. Pulses intact, +2 bilaterally. No cyanosis, pallor or clubbing. PULMONARY:  Unlabored, regular breathing. Clear bilaterally A&P w/o wheezes/rales/rhonchi. No accessory muscle use. No dullness to percussion. GI: BS present and normoactive. Soft,  non-tender to palpation. No organomegaly or masses detected.  MSK: No erythema, warmth or tenderness. Cap refil <2 sec all extrem.  Neuro: A/Ox3. No focal deficits noted.   Skin: Warm, no lesions or rashe Psych: Normal affect and behavior. Judgement and thought content appropriate.     Lab Results:  CBC    Component Value Date/Time   WBC 15.9 (H) 03/26/2023 1000   WBC 30.2 (H) 12/30/2018 0040   RBC 5.91 (H) 03/26/2023 1000   HGB 15.8 03/26/2023 1000   HGB 13.9 03/20/2021 1015   HGB 13.8 08/22/2016 0932   HCT 51.9 03/26/2023 1000   HCT 43.2 03/20/2021 1015   HCT 44.1 08/22/2016 0932   PLT 119 (L) 03/26/2023 1000   PLT 127 (L) 03/20/2021 1015   MCV 87.8 03/26/2023 1000   MCV 84 03/20/2021 1015   MCV 86.0 08/22/2016 0932   MCH 26.7 03/26/2023 1000   MCHC 30.4 03/26/2023 1000   RDW 14.3 03/26/2023 1000   RDW 13.4 03/20/2021 1015   RDW 14.3 08/22/2016 0932   LYMPHSABS 11.6 (H) 03/26/2023 1000   LYMPHSABS 11.4 (H) 03/20/2021 1015   LYMPHSABS 16.6 (H) 08/22/2016 0932   MONOABS 0.6 03/26/2023 1000   MONOABS 0.3 08/22/2016 0932   EOSABS 0.1 03/26/2023 1000   EOSABS 0.1 03/20/2021 1015   BASOSABS 0.0  03/26/2023 1000   BASOSABS 0.0 03/20/2021 1015   BASOSABS 0.0 08/22/2016 0932    BMET    Component Value Date/Time   NA 141 03/26/2023 1000   NA 140 09/13/2022 0834   NA 141 08/22/2016 0932   K 4.6 03/26/2023 1000   K 4.5 08/22/2016 0932   CL 106 03/26/2023 1000   CL 105 01/02/2012 0938   CO2 29 03/26/2023 1000   CO2 28 08/22/2016 0932   GLUCOSE 107 (H) 03/26/2023 1000   GLUCOSE 98 08/22/2016 0932   GLUCOSE 97 01/02/2012 0938   BUN 17 03/26/2023 1000   BUN 13 09/13/2022 0834   BUN 16.6 08/22/2016 0932   CREATININE 0.91 03/26/2023 1000   CREATININE 0.9 08/22/2016 0932   CALCIUM 9.6 03/26/2023 1000   CALCIUM 9.5 08/22/2016 0932   GFRNONAA >60 03/26/2023 1000   GFRAA >60 08/27/2019 0846    BNP    Component Value Date/Time   BNP 304.0 (H) 03/20/2021 1015   BNP 208.8 (H) 12/29/2018 0450     Imaging:  No results found.  Administration History     None          Latest Ref Rng & Units 05/31/2022    8:38 AM 05/07/2021    9:40 AM 10/19/2019    8:47 AM 03/29/2019    1:49 PM  PFT Results  FVC-Pre L 3.60  3.82  3.98  3.31   FVC-Predicted Pre % 85  89  90  74   FVC-Post L 3.51  3.91  4.01  3.32   FVC-Predicted Post % 82  91  91  74   Pre FEV1/FVC % % 74  73  77  76   Post FEV1/FCV % % 76  75  77  78   FEV1-Pre L 2.65  2.77  3.07  2.52   FEV1-Predicted Pre % 86  89  96  77   FEV1-Post L 2.68  2.92  3.08  2.59   DLCO uncorrected ml/min/mmHg 15.75  13.80  14.26  14.25   DLCO UNC% % 62  54  55  54   DLCO corrected ml/min/mmHg 15.75  14.08  14.26  14.21   DLCO COR %Predicted % 62  55  55  54   DLVA Predicted % 68  61  62  69   TLC L 5.73  5.74  6.12  5.78   TLC % Predicted % 80  80  84  79   RV % Predicted % 112  88  84  85     No results found for: NITRICOXIDE      Assessment & Plan:   OSA (obstructive sleep apnea) Underwent CPAP titration in 04/2021 with poor control of OSA on CPAP so he was transitioned to BiPAP. He has increased leaks and residual  events on current settings, likely as a result of 20 lb weight loss. Will decrease his pressure settings and reassess at follow up. Suspect this is contributing to postnasal drainage and associated cough related to upper airway irritation. He is doing well from a sleep/fatigue standpoint. Receiving good benefit from use. Cautioned on safe driving practices.  Patient Instructions  Continue BiPAP nightly. Wear for a minimum of 4-6 hours a night. Adjusted your BiPAP settings down today. Hopefully this helps with the leaks, control of your events and sinus congestion  We discussed how untreated sleep apnea puts an individual at risk for cardiac arrhthymias, pulm HTN, DM, stroke and increases their risk for daytime accidents.    -Continue Trelegy 1 puff daily. Brush tongue and rinse mouth afterwards.  -Continue Albuterol  inhaler 2 puffs every 6 hours as needed for shortness of breath or wheezing. Notify if symptoms persist despite rescue inhaler/neb use. -Continue Claritin  10 mg daily -Restart Flonase  2 sprays each nostril daily  -Mucinex  600 mg Twice daily for chest congestion and nasal drainage  Chest x ray   Call if cough doesn't get better   Follow up in January 2026 with repeat PFTs and after HRCT, and then office visit with Dr. Theophilus. If symptoms worsen, please contact office for sooner follow up or seek emergency care.   Centrilobular emphysema (HCC) Compensated on current regimen. He does not appear to be in acute exacerbation. Cough seems to be related to upper airway irritation. See above. He has transitioned to triple therapy regimen and is doing well on Trelegy. Encouraged to remain active/activity as tolerated.   Chronic rhinitis See above  Interstitial pulmonary disease (HCC) Post COVID fibrosis with prior UIP pattern; however, most recent scan seems to be post COVID sequelae. No evidence of progression. Repeat HRCT chest December 2025 for yearly monitoring. Previous walking  oximetry without desaturations. Currently under surveillance. Plan to repeat PFTs at follow up for monitoring.      I spent 35 minutes of dedicated to the care of this patient on the date of this encounter to include pre-visit review of records, face-to-face time with the patient discussing conditions above, post visit ordering of testing, clinical documentation with the electronic health record, making appropriate referrals as documented, and communicating necessary findings to members of the patients care team.  Comer LULLA Rouleau, NP 10/15/2023  Pt aware and understands NP's role.

## 2023-10-15 ENCOUNTER — Encounter: Payer: Self-pay | Admitting: Nurse Practitioner

## 2023-10-15 DIAGNOSIS — J310 Chronic rhinitis: Secondary | ICD-10-CM | POA: Insufficient documentation

## 2023-10-15 NOTE — Assessment & Plan Note (Signed)
 Compensated on current regimen. He does not appear to be in acute exacerbation. Cough seems to be related to upper airway irritation. See above. He has transitioned to triple therapy regimen and is doing well on Trelegy. Encouraged to remain active/activity as tolerated.

## 2023-10-15 NOTE — Assessment & Plan Note (Signed)
 Post COVID fibrosis with prior UIP pattern; however, most recent scan seems to be post COVID sequelae. No evidence of progression. Repeat HRCT chest December 2025 for yearly monitoring. Previous walking oximetry without desaturations. Currently under surveillance. Plan to repeat PFTs at follow up for monitoring.

## 2023-10-15 NOTE — Assessment & Plan Note (Signed)
 See above.

## 2023-10-15 NOTE — Assessment & Plan Note (Addendum)
 Underwent CPAP titration in 04/2021 with poor control of OSA on CPAP so he was transitioned to BiPAP. He has increased leaks and residual events on current settings, likely as a result of 20 lb weight loss. Will decrease his pressure settings and reassess at follow up. Suspect this is contributing to postnasal drainage and associated cough related to upper airway irritation. He is doing well from a sleep/fatigue standpoint. Receiving good benefit from use. Cautioned on safe driving practices.  Patient Instructions  Continue BiPAP nightly. Wear for a minimum of 4-6 hours a night. Adjusted your BiPAP settings down today. Hopefully this helps with the leaks, control of your events and sinus congestion  We discussed how untreated sleep apnea puts an individual at risk for cardiac arrhthymias, pulm HTN, DM, stroke and increases their risk for daytime accidents.    -Continue Trelegy 1 puff daily. Brush tongue and rinse mouth afterwards.  -Continue Albuterol  inhaler 2 puffs every 6 hours as needed for shortness of breath or wheezing. Notify if symptoms persist despite rescue inhaler/neb use. -Continue Claritin  10 mg daily -Restart Flonase  2 sprays each nostril daily  -Mucinex  600 mg Twice daily for chest congestion and nasal drainage  Chest x ray   Call if cough doesn't get better   Follow up in January 2026 with repeat PFTs and after HRCT, and then office visit with Dr. Theophilus. If symptoms worsen, please contact office for sooner follow up or seek emergency care.

## 2023-10-17 ENCOUNTER — Ambulatory Visit: Payer: Self-pay | Admitting: Nurse Practitioner

## 2023-10-24 ENCOUNTER — Telehealth: Payer: Self-pay | Admitting: *Deleted

## 2023-10-24 DIAGNOSIS — J432 Centrilobular emphysema: Secondary | ICD-10-CM

## 2023-10-24 MED ORDER — TRELEGY ELLIPTA 100-62.5-25 MCG/ACT IN AEPB: 1.0000 | INHALATION_SPRAY | Freq: Every day | RESPIRATORY_TRACT | 4 refills | Status: AC

## 2023-10-24 NOTE — Telephone Encounter (Signed)
 Rx resent for 90 supply Message left for patient informing of update.NFN

## 2023-11-06 DIAGNOSIS — Z23 Encounter for immunization: Secondary | ICD-10-CM | POA: Diagnosis not present

## 2023-12-11 DIAGNOSIS — D696 Thrombocytopenia, unspecified: Secondary | ICD-10-CM | POA: Diagnosis not present

## 2023-12-11 DIAGNOSIS — C9110 Chronic lymphocytic leukemia of B-cell type not having achieved remission: Secondary | ICD-10-CM | POA: Diagnosis not present

## 2023-12-11 DIAGNOSIS — I5022 Chronic systolic (congestive) heart failure: Secondary | ICD-10-CM | POA: Diagnosis not present

## 2023-12-11 DIAGNOSIS — J849 Interstitial pulmonary disease, unspecified: Secondary | ICD-10-CM | POA: Diagnosis not present

## 2023-12-11 DIAGNOSIS — E785 Hyperlipidemia, unspecified: Secondary | ICD-10-CM | POA: Diagnosis not present

## 2023-12-11 DIAGNOSIS — J432 Centrilobular emphysema: Secondary | ICD-10-CM | POA: Diagnosis not present

## 2023-12-11 DIAGNOSIS — K047 Periapical abscess without sinus: Secondary | ICD-10-CM | POA: Diagnosis not present

## 2023-12-11 DIAGNOSIS — J8410 Pulmonary fibrosis, unspecified: Secondary | ICD-10-CM | POA: Diagnosis not present

## 2023-12-11 DIAGNOSIS — I10 Essential (primary) hypertension: Secondary | ICD-10-CM | POA: Diagnosis not present

## 2023-12-15 ENCOUNTER — Other Ambulatory Visit: Payer: Self-pay | Admitting: Physician Assistant

## 2023-12-30 ENCOUNTER — Ambulatory Visit
Admission: RE | Admit: 2023-12-30 | Discharge: 2023-12-30 | Disposition: A | Source: Ambulatory Visit | Attending: Nurse Practitioner

## 2023-12-30 DIAGNOSIS — J849 Interstitial pulmonary disease, unspecified: Secondary | ICD-10-CM

## 2023-12-30 DIAGNOSIS — R0602 Shortness of breath: Secondary | ICD-10-CM | POA: Diagnosis not present

## 2024-01-01 NOTE — Progress Notes (Signed)
 Concerning LLL lung mass. PET scan ordered for further evaluation - needs to be scheduled in next 1-2 weeks. Schedule f/u with Dr. Theophilus in 3-4 weeks to review (can use same day slot). Thanks.

## 2024-01-01 NOTE — Addendum Note (Signed)
 Addended by: Marcheta Horsey V on: 01/01/2024 01:03 PM   Modules accepted: Orders

## 2024-01-02 ENCOUNTER — Telehealth: Payer: Self-pay

## 2024-01-02 NOTE — Telephone Encounter (Signed)
 Copied from CRM [id number]. Topic: Clinical - Lab/Test Results >> Dec 31, 2023 12:10 PM Rozanna MATSU wrote: Reason for CRM: Tiffany with [facility] wanted to make sure the result for pt where sent over from his CT  Results are in chart from CT completed on 9/23.

## 2024-01-08 ENCOUNTER — Telehealth: Payer: Self-pay | Admitting: *Deleted

## 2024-01-08 NOTE — Telephone Encounter (Signed)
 Copied from CRM [id number]. Topic: Clinical - Lab/Test Results >> Dec 31, 2023 12:10 PM Rozanna MATSU wrote: Reason for CRM: Tiffany with [facility] wanted to make sure the result for pt where sent over from his CT >> Jan 08, 2024  2:06 PM Russell J wrote: Pt returning call from [facility]. He believes it was concerning his CT results from imaging done on 12/10.   Requested call back   CB#  [phone]  -----------------------------------------------------------------------------------------------------------------------------------------------  Result of CT scan per Comer Rouleau NP:  Concerning LLL lung mass. PET scan ordered for further evaluation - needs to be scheduled in next 1-2 weeks. Schedule f/u with Dr. Theophilus in 3-4 weeks to review (can use same day slot). Thanks.   I called and spoke with patient, advised of the results/recommendations per Izetta.  He verbalized understanding.  The PET had been scheduled for 01/19/24 at 10:30 am at [facility].  I let him know that I would send a message to our PCCs to verify that day and time and have someone call him.  I let him know they prefer for us  to talk to the patient prior to scheduling a follow up test.  I scheduled him with Dr. Theophilus for 02/10/2024 at 1:30 pm, to arrive at 1:15 pm for check in.  He verbalized understanding.  Nothing further needed.

## 2024-01-08 NOTE — Telephone Encounter (Signed)
 Spoke with patient regarding the Monday 01/19/24 10:30 am PET scan at [facility] time is 10:00 am-1st floor radiology for check in .  Follow up with Dr. Theophilus is scheduled Tuesday 02/10/24 at 1:30 pm--arrival time is 1:15 pm for check in.  Will mail information to patient and he voiced his understanding

## 2024-01-19 ENCOUNTER — Encounter (HOSPITAL_COMMUNITY)
Admission: RE | Admit: 2024-01-19 | Discharge: 2024-01-19 | Disposition: A | Discharge status: Home / Self Care | Source: Ambulatory Visit | Attending: Nurse Practitioner | Admitting: Nurse Practitioner

## 2024-01-19 DIAGNOSIS — R918 Other nonspecific abnormal finding of lung field: Secondary | ICD-10-CM | POA: Insufficient documentation

## 2024-01-19 LAB — GLUCOSE, CAPILLARY: Glucose-Capillary: 98 mg/dL (ref 70–99)

## 2024-01-19 MED ORDER — FLUDEOXYGLUCOSE F - 18 (FDG) INJECTION
10.0000 | Freq: Once | INTRAVENOUS | Status: AC
  Administered 2024-01-19: 9.43 via INTRAVENOUS

## 2024-01-20 ENCOUNTER — Encounter: Payer: Self-pay | Admitting: Nurse Practitioner

## 2024-01-20 ENCOUNTER — Telehealth: Payer: Self-pay | Admitting: *Deleted

## 2024-01-20 ENCOUNTER — Ambulatory Visit: Payer: Self-pay | Admitting: Nurse Practitioner

## 2024-01-20 ENCOUNTER — Ambulatory Visit

## 2024-01-20 ENCOUNTER — Telehealth (HOSPITAL_BASED_OUTPATIENT_CLINIC_OR_DEPARTMENT_OTHER): Payer: Self-pay | Admitting: *Deleted

## 2024-01-20 ENCOUNTER — Telehealth: Payer: Self-pay | Admitting: Nurse Practitioner

## 2024-01-20 ENCOUNTER — Telehealth: Payer: Self-pay

## 2024-01-20 DIAGNOSIS — J90 Pleural effusion, not elsewhere classified: Secondary | ICD-10-CM

## 2024-01-20 DIAGNOSIS — R591 Generalized enlarged lymph nodes: Secondary | ICD-10-CM

## 2024-01-20 DIAGNOSIS — R918 Other nonspecific abnormal finding of lung field: Secondary | ICD-10-CM | POA: Insufficient documentation

## 2024-01-20 NOTE — Telephone Encounter (Signed)
"  °  Patient Consent for Virtual Visit        Rodney Burns has provided verbal consent on 01/20/2024 for a virtual visit (video or telephone).   CONSENT FOR VIRTUAL VISIT FOR:  Rodney Burns  By participating in this virtual visit I agree to the following:  I hereby voluntarily request, consent and authorize [facility] and its employed or contracted physicians, physician assistants, nurse practitioners or other licensed health care professionals (the Practitioner), to provide me with telemedicine health care services (the Services) as deemed necessary by the treating Practitioner. I acknowledge and consent to receive the Services by the Practitioner via telemedicine. I understand that the telemedicine visit will involve communicating with the Practitioner through live audiovisual communication technology and the disclosure of certain medical information by electronic transmission. I acknowledge that I have been given the opportunity to request an in-person assessment or other available alternative prior to the telemedicine visit and am voluntarily participating in the telemedicine visit.  I understand that I have the right to withhold or withdraw my consent to the use of telemedicine in the course of my care at any time, without affecting my right to future care or treatment, and that the Practitioner or I may terminate the telemedicine visit at any time. I understand that I have the right to inspect all information obtained and/or recorded in the course of the telemedicine visit and may receive copies of available information for a reasonable fee.  I understand that some of the potential risks of receiving the Services via telemedicine include:  Delay or interruption in medical evaluation due to technological equipment failure or disruption; Information transmitted may not be sufficient (e.g. poor resolution of images) to allow for appropriate medical decision making by the Practitioner;  and/or  In rare instances, security protocols could fail, causing a breach of personal health information.  Furthermore, I acknowledge that it is my responsibility to provide information about my medical history, conditions and care that is complete and accurate to the best of my ability. I acknowledge that Practitioner's advice, recommendations, and/or decision may be based on factors not within their control, such as incomplete or inaccurate data provided by me or distortions of diagnostic images or specimens that may result from electronic transmissions. I understand that the practice of medicine is not an exact science and that Practitioner makes no warranties or guarantees regarding treatment outcomes. I acknowledge that a copy of this consent can be made available to me via my patient portal [facility] MyChart), or I can request a printed copy by calling the office of [facility].    I understand that my insurance will be billed for this visit.   I have read or had this consent read to me. I understand the contents of this consent, which adequately explains the benefits and risks of the Services being provided via telemedicine.  I have been provided ample opportunity to ask questions regarding this consent and the Services and have had my questions answered to my satisfaction. I give my informed consent for the services to be provided through the use of telemedicine in my medical care    "

## 2024-01-20 NOTE — Telephone Encounter (Signed)
 Pt has appt 01/27/24 with Dr. Jordan for preop clearance. Preop APP Tessa C. PAC has sent to pharm-d for Xarelto  recommendations.

## 2024-01-20 NOTE — Telephone Encounter (Signed)
 Patient with diagnosis of atrial fibrillation on Xarelto  for anticoagulation.    Procedure:  BRONCHOSCOPY w/ EBUS   Date of Surgery:  Clearance 02/03/24 POSSIBLE DATE   CHA2DS2-VASc Score = 5   This indicates a 7.2% annual risk of stroke. The patient's score is based upon: CHF History: 1 HTN History: 1 Diabetes History: 0 Stroke History: 0 Vascular Disease History: 1 Age Score: 2 Gender Score: 0    CrCl 80 Platelet count 119  Patient has not had an Afib/aflutter ablation in the last 3 months, DCCV within the last 4 weeks or a watchman implanted in the last 45 days   Per office protocol, patient can hold Xarelto  for 2 days prior to procedure.   Patient will not need bridging with Lovenox (enoxaparin) around procedure.  **This guidance is not considered finalized until pre-operative APP has relayed final recommendations.**

## 2024-01-20 NOTE — Telephone Encounter (Signed)
"  ° °  Pre-operative Risk Assessment    Patient Name: Rodney Burns  DOB: [birth year] MRN: [id number]   Date of last office visit: 07/15/23 DR. JORDAN Date of next office visit: 01/27/24 DR. JORDAN   Request for Surgical Clearance    Procedure:  BRONCHOSCOPY w/ EBUS  Date of Surgery:  Clearance 02/03/24 POSSIBLE DATE                              Surgeon:  DR. SHELAH Surgeon's Group or Practice Name:  [facility] Phone number:  [phone] Fax number:  [phone]   Type of Clearance Requested:   - Medical  - Pharmacy:  Hold Rivaroxaban  (Xarelto ) x 48 HOURS PRIOR   Type of Anesthesia:  General    Additional requests/questions:    Bonney Niels Jest   01/20/2024, 12:37 PM   "

## 2024-01-20 NOTE — Telephone Encounter (Signed)
" ° °  Name: BECKAM ABDULAZIZ  DOB: 25-Oct-1945  MRN: [id number]  Primary Cardiologist: Peter Jordan, MD  Chart reviewed as part of pre-operative protocol coverage. Because of Rayshon Albaugh Ketchem's past medical history and time since last visit, he will require a follow-up in-office visit in order to better assess preoperative cardiovascular risk.  Pre-op covering staff: - Please schedule appointment and call patient to inform them. If patient already had an upcoming appointment within acceptable timeframe, please add pre-op clearance to the appointment notes so provider is aware. - Please contact requesting surgeon's office via preferred method (i.e, phone, fax) to inform them of need for appointment prior to surgery.  I will route to pharmacy to weigh in on 48-hour Xarelto  hold.  Orren LOISE Fabry, PA-C  01/20/2024, 12:47 PM   "

## 2024-01-20 NOTE — Telephone Encounter (Signed)
 Patient is aware of the appointment they have been scheduled for. Read off the letter to both the patient and wife. Routing it to [employer] for Authorization.

## 2024-01-20 NOTE — Telephone Encounter (Signed)
 Discussed PET scan results with pt. Concerning for malignancy with potential mets throughout lungs, nodes, and left rib. He also has a small to moderate effusion, which is possibly malignant. No acute respiratory symptoms and breathing is stable.   Pt verbalized understanding of results and necessity of bronchoscopy for tissue sampling. Risks/benefits of procedure reviewed. Will need cardiology clearance - message sent to cardiology's preop pool and Dr. Jordan to get pt scheduled ASAP.  Please get pt scheduled with Dr. Shelah on 1/6 or 1/13 for bronchoscopy with EBUS. Will need to hold Xarelto  48 hours prior.

## 2024-01-20 NOTE — Telephone Encounter (Signed)
 Jordan, [name], MD  Pugh, Channing Louder D, LPN; [facility] Triage; [facility] Scheduling Please get him in to see me next week. Thanks  Peter Jordan MD, [facility]       Previous Messages    ----- Message ----- From: Malachy Comer GAILS, NP Sent: 01/20/2024  10:54 AM EST To: Sonny CHRISTELLA Lecher, CMA; Lamar GORMAN Chris, MD; Pe* Subject: Preop risk assessment                          Hello, [facility] pt of Dr. Jordan and Dr. Jiles. Seen by myself at pulm clinic. Had HRCT chest with concerning mass in left lung. 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 Needs to get setup for bronchoscopy with EBUS with Dr. Chris; hoping to get him scheduled 1/5 or 1/6 as there is one opening left each day. May have to push to the following if cards can't get him in for clearance prior to.  He will need preop risk assessment from cardiology for CHF/PAF. Will need Xarelto  held 48 hr prior to procedure. Cards team, can you work on getting this scheduled ASAP? Thanks!!  Thanks, Psychiatric Nurse Cobb,NP

## 2024-01-21 NOTE — Progress Notes (Signed)
 " Cardiology Office Note:    Date:  01/27/2024   ID:  Rodney Burns, DOB 12/12/1945, MRN [id number]  PCP:  Dayna Motto, DO  [facility] Cardiologist:  Tomas Schamp, MD  [facility] Electrophysiologist:  None   Referring MD: Dayna Motto, DO   Chief Complaint  Patient presents with   Atrial Fibrillation   Coronary Artery Disease    History of Present Illness:    Rodney Burns is a 78 y.o. male is seen for pre op clearance for bronchoscopy. He has a hx of atrial flutter, HTN, HLD, CAD s/p CABG 1994, pericarditis in January 2070, chronic systolic heart failure, OSA on CPAP, CLL and thoracic aortic aneurysm.  He had radiofrequency ablation in 2011 of atrial tachycardia/flutter however had recurrent atrial flutter after the ablation. He had repeat ablation of another atrial tachy/flutter in 2013 by Dr Kelsie.   Myoview  in March 2016 showed inferior and anteroapical infarct without ischemia, EF 32%.  Echocardiogram obtained in January 2020 due to pericarditis showed EF 40 to 45%, hypokinesis of the anteroseptal and inferoseptal myocardium, trivial AI, mild MR.  Repeat echocardiogram in October 2020 showed EF 40 to 45%, hypokinesis of the anteroseptal and inferoseptal myocardium, grade 1 DD, mild MR, 40 mm aortic root dilatation.    He was tested positive for COVID-19 in December 2020 and has had a significant acute hypoxic respiratory failure.  He also had a significant transaminitis and was unable to get remdesivir or Actemra.  Toprol -XL was increased and he was given IV digoxin .  CTA January 2021 showed pulmonary fibrosis.  He has been able to wean off of oxygen .  He also completed pulmonary rehab.  Repeat echocardiogram obtained in March 2021 showed EF 35 to 40%, global hypokinesis, normal pulmonary artery systolic pressure, trivial AI, mild dilatation of the aortic root measuring at 37mm.  Spironolactone  was added to his medical regimen at the time.  Note, he was in atrial flutter during the  echocardiogram even though EKG prior to that in December 2020 showed a sinus rhythm. When seen in October 2021, EKG showed he was back in sinus rhythm.  He was also having symptomatic orthostatic hypotension at the time and bradycardia, spironolactone  was decreased to 12.5 mg daily, Toprol -XL decreased to 50 mg daily. When seen last year digoxin  was discontinued. He was felt to be in NSR at that time but no Ecg done.  He was seen by me in February 2023, at which time he complained of dizziness and lightheadedness.  He gained about 8 pounds.  proBNP was elevated at 638.  Spironolactone  was increased to 25 mg daily.  Lasix  20 mg daily was added to his medical regimen.  EKG showed the patient was in controlled atrial fibrillation which may have contributed to his symptoms.  Outpatient cardioversion was scheduled for 3/16 however later canceled as patient arrived in sinus rhythm.  Repeat echocardiogram obtained on 03/22/2021 demonstrated EF 35%, global hypokinesis, mild MR, dilated aortic root at 40 mm.  When see in follow up he was bradycardic so a heart monitor was placed. This showed recurrent Afib with slow rates at times so beta blocker was reduced. Consideration for AAD limited to Tikosyn due to co-morbidities.   He has been having  follow up CT chest scans for his pulmonary fibrosis. His more recent scan showed a new LLL mass c/w bronchogenic carcinoma. Bronchoscopy planned by pulmonary on Monday  He reports he does have some SOB and cough with clear phlegm. Really  minimal chest tightness at times 1-2/10. Never had to use Ntg. No swelling. Weight is stable. No palpitations or dizziness.     Past Medical History:  Diagnosis Date   Atrial tachycardia    a. s/p DCCV 09/2009; b. s/p RFCA 03/2011; c. s/p DCCV 04/2011; d. s/p RFCA 09/17/11.   Cataract    Chronic systolic CHF (congestive heart failure) (HCC)    a. 01/2015 Echo: EF 40-45%, antsept/infsept HK, triv AI, mild MR, mod dil LA, nl RV, mildly dil RA.    CLL (chronic lymphoblastic leukemia)    Stage 0-1   Coronary artery disease involving native coronary artery without angina pectoris    a. Status post CABG in 1994:By Dr. Lucas. LIMA - L Cx, seqSVG-Diag-LAD, and SVG-PDA-PL.   Cough    Consistant with ACE inhibitor mediated cough   Dysrhythmia    Glaucoma (increased eye pressure) 1991   History of tobacco abuse    quit 1994   Hypercholesterolemia    Well Controlled   Hypertension    Ischemic cardiomyopathy    a. 02/2011 Echo: EF 40-45%;  b. 01/2015 Echo: EF 40-45%.   Malaria 1972   Hx of   Pericarditis    a. 01/2015-->Treated w/ ibuprofen /colchicine .   Sleep-disordered breathing 06/20/2011    Past Surgical History:  Procedure Laterality Date   ATRIAL FLUTTER ABLATION N/A 04/11/2011   Procedure: ATRIAL FLUTTER ABLATION;  Surgeon: Elspeth JAYSON Sage, MD;  Location: [facility] CATH LAB;  Service: Cardiovascular;  Laterality: N/A;   ATRIAL FLUTTER ABLATION N/A 09/17/2011   Procedure: ATRIAL FLUTTER ABLATION;  Surgeon: Lynwood Rakers, MD;  Location: [facility] CATH LAB;  Service: Cardiovascular;  Laterality: N/A;   CARDIOVERSION  06/03/2011   Procedure: CARDIOVERSION;  Surgeon: Elspeth JAYSON Sage, MD;  Location: [facility] OR;  Service: Cardiovascular;  Laterality: N/A;   CATARACT EXTRACTION     COLONOSCOPY WITH PROPOFOL  N/A 09/26/2021   Procedure: COLONOSCOPY WITH PROPOFOL ;  Surgeon: Burnette Fallow, MD;  Location: [facility] ENDOSCOPY;  Service: Gastroenterology;  Laterality: N/A;   CORONARY ARTERY BYPASS GRAFT  1994   By Dr. Lucas. LIMA - L Cx, seqSVG-Diag-LAD, and SVG-PDA-PL   ELECTROPHYSIOLOGY STUDY N/A 04/11/2011   Procedure: ELECTROPHYSIOLOGY STUDY;  Surgeon: Elspeth JAYSON Sage, MD;  Location: [facility] CATH LAB;  Service: Cardiovascular;  Laterality: N/A;   EP study and ablation  2013   by Dr Sage, repeat ablation by Dr Rakers   NM MYOVIEW  LTD  03/2014   scar in the inferior and anteroapical regions without ischemia. This is similar to finding on Myoview  in 2013. EF is a little  lower 39%>>32%.   TRIGGER FINGER RELEASE  12/2016   left hand    US  ECHOCARDIOGRAPHY  09-07-09; 02/2011   a. Est EF 40-45%; b. EF 40-45%. Gr 2 DD. Mild LA dil    Current Medications: Current Meds  Medication Sig   acetaminophen  (TYLENOL ) 325 MG tablet Take 325 mg by mouth every 6 (six) hours as needed for mild pain or moderate pain.   albuterol  (VENTOLIN  HFA) 108 (90 Base) MCG/ACT inhaler Inhale 2 puffs into the lungs every 6 (six) hours as needed for wheezing or shortness of breath.   docusate sodium  (COLACE) 100 MG capsule Take 100 mg by mouth 2 (two) times daily.   empagliflozin  (JARDIANCE ) 10 MG TABS tablet Take 1 tablet (10 mg total) by mouth daily before breakfast.   ezetimibe -simvastatin  (VYTORIN ) 10-20 MG tablet TAKE 1 TABLET AT BEDTIME (KEEP UPCOMING APPOINTMENT FOR REFILLS)   Fiber, Guar Gum, CHEW  Chew 3 each by mouth daily.   fluticasone  (FLONASE ) 50 MCG/ACT nasal spray USE 2 SPRAYS IN EACH NOSTRIL DAILY   Fluticasone -Umeclidin-Vilant (TRELEGY ELLIPTA ) 100-62.5-25 MCG/ACT AEPB Inhale 1 puff into the lungs daily. Use 1 inhalation daily   furosemide  (LASIX ) 20 MG tablet TAKE 1 TABLET DAILY (MUST KEEP UPCOMING OFFICE VISIT FOR FURTHER REFILLS)   guaiFENesin  (MUCINEX ) 600 MG 12 hr tablet Take 600 mg by mouth as needed.   ketoconazole (NIZORAL) 2 % cream Apply topically 2 (two) times daily.   latanoprost  (XALATAN ) 0.005 % ophthalmic solution Place 1 drop into both eyes at bedtime.   loratadine  (CLARITIN ) 10 MG tablet TAKE 1 TABLET DAILY   Melatonin 5 MG CHEW Chew 5 mg by mouth at bedtime.   RABEprazole  (ACIPHEX ) 20 MG tablet Take 1 tablet (20 mg total) by mouth daily.   sacubitril -valsartan  (ENTRESTO ) 24-26 MG Take 1 tablet by mouth 2 (two) times daily.   XARELTO  20 MG TABS tablet TAKE 1 TABLET DAILY WITH SUPPER     Allergies:   Penicillins, Ace inhibitors, Darvocet [propoxyphene n-acetaminophen ], Other, and Sulfa antibiotics   Social History   Socioeconomic History   Marital  status: Married    Spouse name: Not on file   Number of children: 2   Years of education: Not on file   Highest education level: Not on file  Occupational History   Occupation: Tree Surgeon: RETIRED  Tobacco Use   Smoking status: Former    Current packs/day: 0.00    Average packs/day: 2.0 packs/day for 30.0 years (60.0 ttl pk-yrs)    Types: Cigarettes    Start date: 01/21/1962    Quit date: 01/22/1992    Years since quitting: 32.0   Smokeless tobacco: Never  Vaping Use   Vaping status: Never Used  Substance and Sexual Activity   Alcohol use: No   Drug use: No   Sexual activity: Yes  Other Topics Concern   Not on file  Social History Narrative   Not on file   Social Drivers of Health   Tobacco Use: Medium Risk (01/27/2024)   Patient History    Smoking Tobacco Use: Former    Smokeless Tobacco Use: Never    Passive Exposure: Not on Actuary Strain: Not on file  Food Insecurity: Not on file  Transportation Needs: Not on file  Physical Activity: Not on file  Stress: Not on file  Social Connections: Not on file  Depression (EYV7-0): Not on file  Alcohol Screen: Not on file  Housing: Not on file  Utilities: Not on file  Health Literacy: Not on file     Family History: The patient's family history includes Cancer in his brother; Down syndrome in his son; Heart failure in his father.  ROS:   Please see the history of present illness.     All other systems reviewed and are negative.  EKGs/Labs/Other Studies Reviewed:    The following studies were reviewed today:  EKG Interpretation Date/Time:  Tuesday January 27 2024 11:19:50 EST Ventricular Rate:  63 PR Interval:    QRS Duration:  112 QT Interval:  390 QTC Calculation: 399 R Axis:   112  Text Interpretation: Atrial fibrillation Left posterior fascicular block Cannot rule out Anterior infarct (cited on or before 15-Jul-2023) ST & T wave abnormality, consider lateral ischemia When  compared with ECG of 15-Jul-2023 14:31, Left posterior fascicular block is now Present Inverted T waves have replaced nonspecific T wave abnormality in  Inferior leads T wave inversion now evident in Anterolateral leads Confirmed by Cliford Sequeira [phone]) on 01/27/2024 11:29:26 AM   Echo 04/14/2019 1. Left ventricular ejection fraction, by estimation, is 35 to 40%. The  left ventricle has moderately decreased function. The left ventricle  demonstrates global hypokinesis. Left ventricular diastolic parameters are  indeterminate.   2. Right ventricular systolic function is mildly reduced. The right  ventricular size is normal. There is normal pulmonary artery systolic  pressure. The estimated right ventricular systolic pressure is 27.2 mmHg.   3. The mitral valve is normal in structure. Trivial mitral valve  regurgitation. No evidence of mitral stenosis.   4. The aortic valve is tricuspid. Aortic valve regurgitation is trivial.  Mild aortic valve sclerosis is present, with no evidence of aortic valve  stenosis.   5. Aortic dilatation noted. There is mild dilatation of the aortic root  measuring 37 mm.   6. The inferior vena cava is normal in size with greater than 50%  respiratory variability, suggesting right atrial pressure of 3 mmHg.   7. The patient was in atrial flutter.   Echo 03/22/21: IMPRESSIONS Left ventricular ejection fraction, by estimation, is 35%. The left ventricle has moderately decreased function. The left ventricle demonstrates global hypokinesis. The left ventricular internal cavity size was mildly dilated. Left ventricular diastolic parameters are indeterminate. 1. Right ventricular systolic function is normal. The right ventricular size is mildly enlarged. Tricuspid regurgitation signal is inadequate for assessing PA pressure. 2. 3. Left atrial size was mildly dilated. 4. Right atrial size was mildly dilated. The mitral valve is normal in structure. Mild mitral valve  regurgitation. No evidence of mitral stenosis. 5. The aortic valve is tricuspid. There is mild calcification of the aortic valve. Aortic valve regurgitation is trivial. Aortic valve sclerosis/calcification is present, without any evidence of aortic stenosis. 6. 7. Aortic dilatation noted. There is mild dilatation of the aortic root, measuring 40 mm. The inferior vena cava is dilated in size with >50% respiratory variability, suggesting right atrial pressure of 8 mmHg. 8. 9. The patient was in atrial fibrillation.  EKG Interpretation Date/Time:  Tuesday January 27 2024 11:19:50 EST Ventricular Rate:  63 PR Interval:    QRS Duration:  112 QT Interval:  390 QTC Calculation: 399 R Axis:   112  Text Interpretation: Atrial fibrillation Left posterior fascicular block Cannot rule out Anterior infarct (cited on or before 15-Jul-2023) ST & T wave abnormality, consider lateral ischemia When compared with ECG of 15-Jul-2023 14:31, Left posterior fascicular block is now Present Inverted T waves have replaced nonspecific T wave abnormality in Inferior leads T wave inversion now evident in Anterolateral leads Confirmed by Teller Wakefield [phone]) on 01/27/2024 11:29:26 AM   Recent Labs: 03/26/2023: ALT 17; BUN 17; Creatinine 0.91; Hemoglobin 15.8; Platelet Count 119; Potassium 4.6; Sodium 141  Recent Lipid Panel    Component Value Date/Time   CHOL 108 10/16/2017 1435   TRIG 93 12/24/2018 1134   HDL 42 10/16/2017 1435   LDLCALC 55 10/16/2017 1435   Dated 10/23/20: cholesterol 111, triglycerides 46, HDL 45, LDL 55. Dated 05/30/21: A1c 6.3%. cholesterol 105, triglycerides 58, HDL 38, LDL 54. CMET and CBC normal.  Risk Assessment/Calculations:       Physical Exam:    VS:  BP (!) 106/58   Pulse 63   Ht 5' 10 (1.778 m)   Wt 192 lb 12.8 oz (87.5 kg)   SpO2 96%   BMI 27.66 kg/m     Wt  Readings from Last 3 Encounters:  01/27/24 192 lb 12.8 oz (87.5 kg)  10/14/23 190 lb (86.2 kg)  07/15/23 197  lb (89.4 kg)     GEN:  Well nourished, well developed in no acute distress HEENT: Normal NECK: No JVD; No carotid bruits LYMPHATICS: No lymphadenopathy CARDIAC: IRRR, no murmurs, rubs, gallops RESPIRATORY:  Clear to auscultation without rales, wheezing or rhonchi  ABDOMEN: Soft, non-tender, non-distended MUSCULOSKELETAL:  No edema; No deformity  SKIN: Warm and dry NEUROLOGIC:  Alert and oriented x 3 PSYCHIATRIC:  Normal affect   ASSESSMENT:    1. Pre-operative clearance   2. Coronary artery disease involving coronary bypass graft of native heart without angina pectoris   3. Chronic systolic CHF (congestive heart failure) (HCC)   4. Permanent atrial fibrillation (HCC)   5. Lung mass   6. Interstitial pulmonary disease (HCC)         PLAN:    In order of problems listed above:  Atrial fibrillation with controlled response. no longer on metoprolol  due to bradycardia. On Xarelto . Continue current therapy. Ok to hold Xarelto  for 48 hours for bronchoscopy.   Chronic combined systolic/diastolic CHF. EF 35%. On Entresto  and  Jardiance . Unable to titrate with low BP.  Continue lasix  20 mg daily. No beta blocker due to bradycardia. Appears euvolemic today.   CAD s/p remote CABG in 1994. Class 1 angina  Hyperlipidemia. On statin.  Remote atrial flutter/tachycardia s/p ablation x 2.   6.   HTN controlled.   7.   LLL mass c/w bronchogenic CA. Likely metastatic based on PET CT. Ok from a CV standpoint to proceed with bronchoscopy.      Follow up in 6 months.    Medication Adjustments/Labs and Tests Ordered: Current medicines are reviewed at length with the patient today.  Concerns regarding medicines are outlined above.  Orders Placed This Encounter  Procedures   EKG 12-Lead   No orders of the defined types were placed in this encounter.   Patient Instructions  Medication Instructions:   *If you need a refill on your cardiac medications before your next appointment,  please call your pharmacy*  Lab Work:   Testing/Procedures:   Follow-Up: At [facility], you and your health needs are our priority.  As part of our continuing mission to provide you with exceptional heart care, our providers are all part of one team.  This team includes your primary Cardiologist (physician) and Advanced Practice Providers or APPs (Physician Assistants and Nurse Practitioners) who all work together to provide you with the care you need, when you need it.  Your next appointment:      Provider:     We recommend signing up for the patient portal called MyChart.  Sign up information is provided on this After Visit Summary.  MyChart is used to connect with patients for Virtual Visits (Telemedicine).  Patients are able to view lab/test results, encounter notes, upcoming appointments, etc.  Non-urgent messages can be sent to your provider as well.   To learn more about what you can do with MyChart, go to [email or web address].      Signed, Rodney Figgs, MD  01/27/2024 11:47 AM    [facility] "

## 2024-01-21 NOTE — Telephone Encounter (Signed)
 Patient is aware of the change. They are aware it's now 7:30am at 02/02/23.

## 2024-01-26 NOTE — Telephone Encounter (Signed)
 Appointment has been scheduled with Dr.Jordan 1/6 at 11:20 am.

## 2024-01-27 ENCOUNTER — Encounter: Payer: Self-pay | Admitting: Cardiology

## 2024-01-27 ENCOUNTER — Ambulatory Visit: Admitting: Cardiology

## 2024-01-27 VITALS — BP 106/58 | HR 63 | Ht 70.0 in | Wt 192.8 lb

## 2024-01-27 DIAGNOSIS — I25810 Atherosclerosis of coronary artery bypass graft(s) without angina pectoris: Secondary | ICD-10-CM | POA: Insufficient documentation

## 2024-01-27 DIAGNOSIS — R918 Other nonspecific abnormal finding of lung field: Secondary | ICD-10-CM | POA: Insufficient documentation

## 2024-01-27 DIAGNOSIS — I4821 Permanent atrial fibrillation: Secondary | ICD-10-CM | POA: Diagnosis not present

## 2024-01-27 DIAGNOSIS — J849 Interstitial pulmonary disease, unspecified: Secondary | ICD-10-CM | POA: Insufficient documentation

## 2024-01-27 DIAGNOSIS — Z01818 Encounter for other preprocedural examination: Secondary | ICD-10-CM | POA: Diagnosis not present

## 2024-01-27 DIAGNOSIS — I5022 Chronic systolic (congestive) heart failure: Secondary | ICD-10-CM | POA: Diagnosis not present

## 2024-01-27 NOTE — Patient Instructions (Addendum)
 Medication Instructions:  Continue all medications *If you need a refill on your cardiac medications before your next appointment, please call your pharmacy*  Lab Work: None ordered  Testing/Procedures: None ordered  Follow-Up: At [facility], you and your health needs are our priority.  As part of our continuing mission to provide you with exceptional heart care, our providers are all part of one team.  This team includes your primary Cardiologist (physician) and Advanced Practice Providers or APPs (Physician Assistants and Nurse Practitioners) who all work together to provide you with the care you need, when you need it.  Your next appointment:  6 months    Call in April to schedule July appointment     Provider:  Dr.Jordan   We recommend signing up for the patient portal called MyChart.  Sign up information is provided on this After Visit Summary.  MyChart is used to connect with patients for Virtual Visits (Telemedicine).  Patients are able to view lab/test results, encounter notes, upcoming appointments, etc.  Non-urgent messages can be sent to your provider as well.   To learn more about what you can do with MyChart, go to [email or web address].

## 2024-01-28 ENCOUNTER — Other Ambulatory Visit: Payer: Self-pay | Admitting: Emergency Medicine

## 2024-01-29 ENCOUNTER — Other Ambulatory Visit: Payer: Self-pay

## 2024-01-29 ENCOUNTER — Encounter (HOSPITAL_COMMUNITY): Payer: Self-pay | Admitting: Emergency Medicine

## 2024-01-29 NOTE — Progress Notes (Signed)
 PCP - Dayna Motto, DO  Cardiologist - Peter Jordan, MD   PPM/ICD - denies Device Orders - n/a Rep Notified - n/a  Chest x-ray - 10-15-23 EKG - 01-27-24 Stress Test - 03-2014 ECHO - 03-22-21 Cardiac Cath -   CPAP - Patient reports he sleeps with BiPap  Dm denies  Blood Thinner Instructions: XARELTO  (HOLD 48 HR PRIOR) Last dose 01-30-23 Aspirin Instructions: denies  ERAS Protcol - NPO  COVID TEST- n/a  Anesthesia review: yes, cardiac hx.  Patient verbally denies any shortness of breath, fever, cough and chest pain during phone call   -------------  SDW INSTRUCTIONS given:  Your procedure is scheduled on February 02, 2023.  Report to [facility] Main Entrance A at 5:30 A.M., and check in at the Admitting office.  Call this number if you have problems the morning of surgery:  [phone]   Remember:  Do not eat or drink after midnight the night before your surgery      Take these medicines the morning of surgery with A SIP OF WATER  acetaminophen  (TYLENOL )  albuterol  (VENTOLIN  HFA)  inhaler  fluticasone  (FLONASE )  TRELEGY ELLIPTA   guaiFENesin  (MUCINEX )  loratadine  (CLARITIN )  RABEprazole  (ACIPHEX )    As of today, STOP taking any Aspirin (unless otherwise instructed by your surgeon) Aleve, Naproxen, Ibuprofen , Motrin , Advil , Goody's, BC's, all herbal medications, fish oil, and all vitamins.                      Do not wear jewelry, make up, or nail polish            Do not wear lotions, powders, perfumes/colognes, or deodorant.            Do not shave 48 hours prior to surgery.  Men may shave face and neck.            Do not bring valuables to the hospital.            [facility] is not responsible for any belongings or valuables.  Do NOT Smoke (Tobacco/Vaping) 24 hours prior to your procedure If you use a CPAP at night, you may bring all equipment for your overnight stay.   Contacts, glasses, dentures or bridgework may not be worn into surgery.      For  patients admitted to the hospital, discharge time will be determined by your treatment team.   Patients discharged the day of surgery will not be allowed to drive home, and someone needs to stay with them for 24 hours.    Special instructions:   [facility]- Preparing For Surgery  Before surgery, you can play an important role. Because skin is not sterile, your skin needs to be as free of germs as possible. You can reduce the number of germs on your skin by washing with CHG (chlorahexidine gluconate) Soap before surgery.  CHG is an antiseptic cleaner which kills germs and bonds with the skin to continue killing germs even after washing.    Oral Hygiene is also important to reduce your risk of infection.  Remember - BRUSH YOUR TEETH THE MORNING OF SURGERY WITH YOUR REGULAR TOOTHPASTE  Please do not use if you have an allergy to CHG or antibacterial soaps. If your skin becomes reddened/irritated stop using the CHG.  Do not shave (including legs and underarms) for at least 48 hours prior to first CHG shower. It is OK to shave your face.  Please follow these instructions carefully.   Shower  the NIGHT BEFORE SURGERY and the MORNING OF SURGERY with DIAL Soap.   Pat yourself dry with a CLEAN TOWEL.  Wear CLEAN PAJAMAS to bed the night before surgery  Place CLEAN SHEETS on your bed the night of your first shower and DO NOT SLEEP WITH PETS.   Day of Surgery: Please shower morning of surgery  Wear Clean/Comfortable clothing the morning of surgery Do not apply any deodorants/lotions.   Remember to brush your teeth WITH YOUR REGULAR TOOTHPASTE.   Questions were answered. Patient verbalized understanding of instructions.

## 2024-01-30 ENCOUNTER — Other Ambulatory Visit: Payer: Self-pay | Admitting: Physician Assistant

## 2024-01-30 NOTE — Progress Notes (Signed)
 Anesthesia Chart Review: SAME DAY WORK-UP  Case: [id number] Date/Time: 02/02/24 0730   Procedure: BRONCHOSCOPY, WITH EBUS (Left)   Anesthesia type: General   Diagnosis: Lung mass [R91.8]   Pre-op diagnosis: lung mass   [facility] / [facility]   Surgeons: Shelah Lamar RAMAN, MD       DISCUSSION: Patient is a 79 year old male scheduled for the above procedure.  History includes former smoker (quit 1994), CAD (anterior MI s/p CABG 1994), atrial fibrillation/flutter (s/p multiple DCCV 09/2009 & 05/2011; catheter ablation 04/12/2011 & ablation of atrial tachycardia 09/17/2011), pericarditis (01/2015), HFrEF, chronic CLL (stage I; diagnosed 07/2009), hypercholesterolemia, HTN, malaria (1972), OSA (on BiPAP), dyspnea, glaucoma.  Last visit with cardiologist Dr. Jordan was on 01/27/2024 for follow-up and preoperative evaluation prior to bronchoscopy.  History of MI, CABG in 1994.  Atrial tachycardia/flutter ablations x 2 in 2013.  Nonischemic stress test in 2016, EF 32% with evidence of prior inferior and anteroapical infarct. He was treated with NSAIDs and colchicine  for pericarditis in January 2017.  He was hospitalized in December 2020 with COVID 19 and acute hypoxic respiratory failure.  CTA in January 2021 showed pulmonary fibrosis.  He had repeat HRCT in December 2025 for yearly monitoring and noted to have grossly stable ILD but also a LLL mass, highly worrisome for primary bronchogenic carcinoma.   He was in rate controlled afib at 01/27/2024 follow-up with Dr. Jordan. He has had recurrent afib since ~ 02/2021. Last TTE 02/2021 showed LVEF 35%, LV global hypokinesis, mildly dilated LV cavity, normal RV systolic function, mild RVH, mildly dilated LA/RA, mild MR, mild AV calcification without AR/AS, aortic root 40 mm. Minimal chest tightness 1-2/10 and had not had to use nitroglycerin. Appeared euvolemic.  Up-titration of GDMT for CHF limited by hypotension. Not on beta-blocker to  bradycardia.  Continue statin, Entresto , Jardiance , Lasix .  He may hold Xarelto  48 hours prior to bronchoscopy.  He wrote, Ok from a CV standpoint to proceed with bronchoscopy.  Reported last Xarelto  01/30/2024.  Anesthesia team to evaluate on the day of surgery.  Updated labs on arrival as indicated.  VS: Ht 5' 10 (1.778 m)   Wt 87.5 kg   BMI 27.68 kg/m   BP Readings from Last 3 Encounters:  01/27/24 (!) 106/58  10/14/23 (!) 90/52  07/15/23 (!) 90/56   Pulse Readings from Last 3 Encounters:  01/27/24 63  10/14/23 89  07/15/23 92     PROVIDERS: Dayna Motto, DO is PCP Jordan, Peter, MD is cardiologist Jackqueline Potts, MD is [facility] Malachy Crank, NP is pulmonology provider   LABS: Last lab results in [facility] include: Lab Results  Component Value Date   WBC 15.9 (H) 03/26/2023   HGB 15.8 03/26/2023   HCT 51.9 03/26/2023   PLT 119 (L) 03/26/2023   GLUCOSE 107 (H) 03/26/2023   ALT 17 03/26/2023   AST 16 03/26/2023   NA 141 03/26/2023   K 4.6 03/26/2023   CL 106 03/26/2023   CREATININE 0.91 03/26/2023   BUN 17 03/26/2023   CO2 29 03/26/2023    IMAGES: PET scan 01/19/2024: IMPRESSION: 1. Hypermetabolic left lower lobe mass with adjacent conglomerate adenopathy, compatible with malignancy, with occlusion of the superior segmental bronchus and narrowing of the left lower lobe bronchus. 2. Hypermetabolic left lower paratracheal and subcarinal/paraesophageal lymph nodes, compatible with malignancy. 3. Hypermetabolic lytic lesion of the left ninth rib, compatible with metastasis. 4. Mildly hypermetabolic right lower lobe subpleural nodules and subsolid left upper  lobe nodularity, indeterminate for malignancy. 5. Small to moderate left pleural effusion with passive atelectasis. 6. Cervical, thoracic, and lumbar spondylosis and degenerative bilateral hip arthropathy. 7. Additional chronic/incidental findings include cardiomegaly with prior coronary artery bypass  grafting, systemic atherosclerosis (including coronary, aortic, carotid, and iliac calcifications), and prostatomegaly.   CT chest high-resolution 12/30/2023: IMPRESSION: 1. Pulmonary parenchymal pattern of interstitial lung disease, as detailed above, grossly stable from 01/01/2023. Findings are categorized as probable UIP per consensus guidelines: Diagnosis of Idiopathic Pulmonary Fibrosis: An Official ATS/ERS/JRS/ALAT Clinical Practice Guideline. Am JINNY Honey Crit Care Med Vol 198, Iss 5, [phone], Sep 21 2016. 2. Left lower lobe mass, highly worrisome for primary bronchogenic carcinoma. These results will be called to the ordering clinician or representative by the Radiologist Assistant, and communication documented in the PACS or [employer]. 3. Additional bilateral pulmonary nodules are similar. Recommend continued attention on follow-up as adenocarcinomas cannot be excluded. 4. Small left pleural effusion. 5. Aortic atherosclerosis (ICD10-I70.0). Coronary artery calcification. 6. Enlarged pulmonic trunk, indicative of pulmonary arterial hypertension.   EKG: 01/27/2024: Atrial fibrillation at 63 bpm  Left posterior fascicular block  Cannot rule out Anterior infarct (cited on or before 15-Jul-2023)  ST & T wave abnormality, consider lateral ischemia When compared with ECG of 15-Jul-2023 14:31, Left posterior fascicular block is now Present Inverted T waves have replaced nonspecific T wave abnormality in Inferior leads T wave inversion now evident in Anterolateral leads Confirmed by Jordan, Peter [phone]) on 01/27/2024 11:29:26 AM    CV: Long-term patch monitor 05/25/2021 - 05/28/2021: Atrial fibrillation with rate ranging from 31-96 bpm. average 57 bpm. Pauses of 3.8 3.9 and 4.19 seconds occuring at 4:20 am and 7 am. No symptoms recorded Occ PVCs. with rate run of NSVT up to 5 beats. Symptoms of lightheadedness and dizziness appear to correlate with Afib with rate from 45-70  bpm   Echo 03/22/2021: IMPRESSIONS   1. Left ventricular ejection fraction, by estimation, is 35%. The left  ventricle has moderately decreased function. The left ventricle  demonstrates global hypokinesis. The left ventricular internal cavity size  was mildly dilated. Left ventricular  diastolic parameters are indeterminate.   2. Right ventricular systolic function is normal. The right ventricular  size is mildly enlarged. Tricuspid regurgitation signal is inadequate for  assessing PA pressure.   3. Left atrial size was mildly dilated.   4. Right atrial size was mildly dilated.   5. The mitral valve is normal in structure. Mild mitral valve  regurgitation. No evidence of mitral stenosis.   6. The aortic valve is tricuspid. There is mild calcification of the  aortic valve. Aortic valve regurgitation is trivial. Aortic valve  sclerosis/calcification is present, without any evidence of aortic  stenosis.   7. Aortic dilatation noted. There is mild dilatation of the aortic root,  measuring 40 mm.   8. The inferior vena cava is dilated in size with >50% respiratory  variability, suggesting right atrial pressure of 8 mmHg.   9. The patient was in atrial fibrillation.  - Comparison(s): 04/14/19 EF 35-40%.    Nuclear stress test 03/30/2014: Overall Impression:  High risk stress nuclear study with large, moderate intensity fixed distal anterior, apical and inferior defect suggestive of scar. No reversible ischemia.   LV Wall Motion:  Global hypokinesis with distal anterior and apical akinesis. LVEF 32%  Past Medical History:  Diagnosis Date   Atrial tachycardia    a. s/p DCCV 09/2009; b. s/p RFCA 03/2011; c. s/p DCCV 04/2011; d.  s/p RFCA 09/17/11.   Cataract    Chronic systolic CHF (congestive heart failure) (HCC)    a. 01/2015 Echo: EF 40-45%, antsept/infsept HK, triv AI, mild MR, mod dil LA, nl RV, mildly dil RA.   CLL (chronic lymphoblastic leukemia)    Stage 0-1   Coronary artery disease  involving native coronary artery without angina pectoris    a. Status post CABG in 1994:By Dr. Lucas. LIMA - L Cx, seqSVG-Diag-LAD, and SVG-PDA-PL.   Cough    Consistant with ACE inhibitor mediated cough   Dyspnea    Dysrhythmia    Glaucoma (increased eye pressure) 1991   History of tobacco abuse    quit 1994   Hypercholesterolemia    Well Controlled   Hypertension    Ischemic cardiomyopathy    a. 02/2011 Echo: EF 40-45%;  b. 01/2015 Echo: EF 40-45%.   Malaria 1972   Hx of   Myocardial infarction (HCC)    Pericarditis    a. 01/2015-->Treated w/ ibuprofen /colchicine .   Sleep apnea    Sleep-disordered breathing 06/20/2011    Past Surgical History:  Procedure Laterality Date   ATRIAL FLUTTER ABLATION N/A 04/11/2011   Procedure: ATRIAL FLUTTER ABLATION;  Surgeon: Elspeth JAYSON Sage, MD;  Location: [facility] CATH LAB;  Service: Cardiovascular;  Laterality: N/A;   ATRIAL FLUTTER ABLATION N/A 09/17/2011   Procedure: ATRIAL FLUTTER ABLATION;  Surgeon: Lynwood Rakers, MD;  Location: [facility] C[facility] LAB;  Service: Cardiovascular;  Laterality: N/A;   CARDIOVERSION  06/03/2011   Procedure: CARDIOVERSION;  Surgeon: Elspeth JAYSON Sage, MD;  Location: [facility] OR;  Service: Cardiovascular;  Laterality: N/A;   CATARACT EXTRACTION     COLONOSCOPY WITH PROPOFOL  N/A 09/26/2021   Procedure: COLONOSCOPY WITH PROPOFOL ;  Surgeon: Burnette Fallow, MD;  Location: [facility] ENDOSCOPY;  Service: Gastroenterology;  Laterality: N/A;   CORONARY ARTERY BYPASS GRAFT  1994   By Dr. Lucas. LIMA - L Cx, seqSVG-Diag-LAD, and SVG-PDA-PL   ELECTROPHYSIOLOGY STUDY N/A 04/11/2011   Procedure: ELECTROPHYSIOLOGY STUDY;  Surgeon: Elspeth JAYSON Sage, MD;  Location: [facility] CATH LAB;  Service: Cardiovascular;  Laterality: N/A;   EP study and ablation  2013   by Dr Sage, repeat ablation by Dr Rakers   NM MYOVIEW  LTD  03/2014   scar in the inferior and anteroapical regions without ischemia. This is similar to finding on Myoview  in 2013. EF is a little lower 39%>>32%.    TRIGGER FINGER RELEASE  12/2016   left hand    US  ECHOCARDIOGRAPHY  09-07-09; 02/2011   a. Est EF 40-45%; b. EF 40-45%. Gr 2 DD. Mild LA dil    MEDICATIONS:  acetaminophen  (TYLENOL ) 325 MG tablet   albuterol  (VENTOLIN  HFA) 108 (90 Base) MCG/ACT inhaler   docusate sodium  (COLACE) 100 MG capsule   empagliflozin  (JARDIANCE ) 10 MG TABS tablet   ezetimibe -simvastatin  (VYTORIN ) 10-20 MG tablet   Fiber, Guar Gum, CHEW   fluticasone  (FLONASE ) 50 MCG/ACT nasal spray   Fluticasone -Umeclidin-Vilant (TRELEGY ELLIPTA ) 100-62.5-25 MCG/ACT AEPB   furosemide  (LASIX ) 20 MG tablet   guaiFENesin  (MUCINEX ) 600 MG 12 hr tablet   ketoconazole (NIZORAL) 2 % cream   latanoprost  (XALATAN ) 0.005 % ophthalmic solution   loratadine  (CLARITIN ) 10 MG tablet   Melatonin 5 MG CHEW   RABEprazole  (ACIPHEX ) 20 MG tablet   sacubitril -valsartan  (ENTRESTO ) 24-26 MG   XARELTO  20 MG TABS tablet    Rodney Ruder, PA-C Surgical Short Stay/Anesthesiology [facility] Phone [phone] [facility] Phone [phone] 01/30/2024 1:28 PM

## 2024-01-30 NOTE — Anesthesia Preprocedure Evaluation (Signed)
 "                                  Anesthesia Evaluation  Patient identified by MRN, date of birth, ID band Patient awake    Reviewed: Allergy & Precautions, H&P , NPO status , Patient's Chart, lab work & pertinent test results  History of Anesthesia Complications Negative for: history of anesthetic complications  Airway Mallampati: II  TM Distance: >3 FB Neck ROM: Full    Dental no notable dental hx.    Pulmonary sleep apnea , COPD, former smoker Left lung mass   Pulmonary exam normal breath sounds clear to auscultation       Cardiovascular hypertension, + CAD, + Past MI, + CABG and +CHF  Normal cardiovascular exam+ dysrhythmias Atrial Fibrillation  Rhythm:Regular Rate:Normal  03/2021: IMPRESSIONS     1. Left ventricular ejection fraction, by estimation, is 35%. The left  ventricle has moderately decreased function. The left ventricle  demonstrates global hypokinesis. The left ventricular internal cavity size  was mildly dilated. Left ventricular  diastolic parameters are indeterminate.   2. Right ventricular systolic function is normal. The right ventricular  size is mildly enlarged. Tricuspid regurgitation signal is inadequate for  assessing PA pressure.   3. Left atrial size was mildly dilated.   4. Right atrial size was mildly dilated.   5. The mitral valve is normal in structure. Mild mitral valve  regurgitation. No evidence of mitral stenosis.   6. The aortic valve is tricuspid. There is mild calcification of the  aortic valve. Aortic valve regurgitation is trivial. Aortic valve  sclerosis/calcification is present, without any evidence of aortic  stenosis.   7. Aortic dilatation noted. There is mild dilatation of the aortic root,  measuring 40 mm.   8. The inferior vena cava is dilated in size with >50% respiratory  variability, suggesting right atrial pressure of 8 mmHg.   9. The patient was in atrial fibrillation.     Neuro/Psych neg Seizures  negative neurological ROS  negative psych ROS   GI/Hepatic negative GI ROS, Neg liver ROS,neg GERD  ,,  Endo/Other  negative endocrine ROS    Renal/GU negative Renal ROS  negative genitourinary   Musculoskeletal negative musculoskeletal ROS (+)    Abdominal   Peds negative pediatric ROS (+)  Hematology CLL   Anesthesia Other Findings   Reproductive/Obstetrics negative OB ROS                              Anesthesia Physical Anesthesia Plan  ASA: 3  Anesthesia Plan: General   Post-op Pain Management: Minimal or no pain anticipated   Induction: Intravenous  PONV Risk Score and Plan: 2 and Ondansetron , Dexamethasone  and Treatment may vary due to age or medical condition  Airway Management Planned: Oral ETT  Additional Equipment: None  Intra-op Plan:   Post-operative Plan: Extubation in OR  Informed Consent: I have reviewed the patients History and Physical, chart, labs and discussed the procedure including the risks, benefits and alternatives for the proposed anesthesia with the patient or authorized representative who has indicated his/her understanding and acceptance.     Dental advisory given  Plan Discussed with: CRNA  Anesthesia Plan Comments: (PAT note written 01/30/2024 by Keigo Whalley, PA-C.  Patient is a 79 year old male scheduled for the above procedure.   History includes former smoker (quit 1994), CAD (  anterior MI s/p CABG 1994), atrial fibrillation/flutter (s/p multiple DCCV 09/2009 & 05/2011; catheter ablation 04/12/2011 & ablation of atrial tachycardia 09/17/2011), pericarditis (01/2015), HFrEF, chronic CLL (stage I; diagnosed 07/2009), hypercholesterolemia, HTN, malaria (1972), OSA (on BiPAP), dyspnea, glaucoma.   Last visit with cardiologist Dr. Jordan was on 01/27/2024 for follow-up and preoperative evaluation prior to bronchoscopy.  History of MI, CABG in 1994.  Atrial tachycardia/flutter ablations x 2 in 2013.  Nonischemic  stress test in 2016, EF 32% with evidence of prior inferior and anteroapical infarct. He was treated with NSAIDs and colchicine  for pericarditis in January 2017.  He was hospitalized in December 2020 with COVID 19 and acute hypoxic respiratory failure.  CTA in January 2021 showed pulmonary fibrosis.  He had repeat HRCT in December 2025 for yearly monitoring and noted to have grossly stable ILD but also a LLL mass, highly worrisome for primary bronchogenic carcinoma.    He was in rate controlled afib at 01/27/2024 follow-up with Dr. Jordan. He has had recurrent afib since ~ 02/2021. Last TTE 02/2021 showed LVEF 35%, LV global hypokinesis, mildly dilated LV cavity, normal RV systolic function, mild RVH, mildly dilated LA/RA, mild MR, mild AV calcification without AR/AS, aortic root 40 mm. Minimal chest tightness 1-2/10 and had not had to use nitroglycerin. Appeared euvolemic.  Up-titration of GDMT for CHF limited by hypotension. Not on beta-blocker to bradycardia.  Continue statin, Entresto , Jardiance , Lasix .  He may hold Xarelto  48 hours prior to bronchoscopy.  He wrote, Ok from a CV standpoint to proceed with bronchoscopy.   Reported last Xarelto  01/30/2024. )         Anesthesia Quick Evaluation  "

## 2024-02-02 ENCOUNTER — Other Ambulatory Visit: Payer: Self-pay

## 2024-02-02 ENCOUNTER — Encounter (HOSPITAL_COMMUNITY): Payer: Self-pay | Admitting: Emergency Medicine

## 2024-02-02 ENCOUNTER — Ambulatory Visit (HOSPITAL_BASED_OUTPATIENT_CLINIC_OR_DEPARTMENT_OTHER): Payer: Self-pay | Admitting: Vascular Surgery

## 2024-02-02 ENCOUNTER — Encounter (HOSPITAL_COMMUNITY)
Admission: RE | Disposition: A | Discharge status: Home / Self Care | Payer: Self-pay | Source: Home / Self Care | Attending: Emergency Medicine

## 2024-02-02 ENCOUNTER — Encounter (HOSPITAL_COMMUNITY): Payer: Self-pay | Admitting: Vascular Surgery

## 2024-02-02 ENCOUNTER — Ambulatory Visit (HOSPITAL_COMMUNITY)
Admission: RE | Admit: 2024-02-02 | Discharge: 2024-02-02 | Disposition: A | Discharge status: Home / Self Care | Attending: Emergency Medicine | Admitting: Emergency Medicine

## 2024-02-02 DIAGNOSIS — E785 Hyperlipidemia, unspecified: Secondary | ICD-10-CM | POA: Diagnosis not present

## 2024-02-02 DIAGNOSIS — I4891 Unspecified atrial fibrillation: Secondary | ICD-10-CM | POA: Diagnosis not present

## 2024-02-02 DIAGNOSIS — C771 Secondary and unspecified malignant neoplasm of intrathoracic lymph nodes: Secondary | ICD-10-CM | POA: Diagnosis not present

## 2024-02-02 DIAGNOSIS — Z951 Presence of aortocoronary bypass graft: Secondary | ICD-10-CM | POA: Insufficient documentation

## 2024-02-02 DIAGNOSIS — I2510 Atherosclerotic heart disease of native coronary artery without angina pectoris: Secondary | ICD-10-CM | POA: Diagnosis not present

## 2024-02-02 DIAGNOSIS — Z8613 Personal history of malaria: Secondary | ICD-10-CM | POA: Diagnosis not present

## 2024-02-02 DIAGNOSIS — Z7984 Long term (current) use of oral hypoglycemic drugs: Secondary | ICD-10-CM | POA: Diagnosis not present

## 2024-02-02 DIAGNOSIS — Z7901 Long term (current) use of anticoagulants: Secondary | ICD-10-CM | POA: Insufficient documentation

## 2024-02-02 DIAGNOSIS — R590 Localized enlarged lymph nodes: Secondary | ICD-10-CM

## 2024-02-02 DIAGNOSIS — R918 Other nonspecific abnormal finding of lung field: Secondary | ICD-10-CM | POA: Diagnosis present

## 2024-02-02 DIAGNOSIS — I252 Old myocardial infarction: Secondary | ICD-10-CM | POA: Insufficient documentation

## 2024-02-02 DIAGNOSIS — I4821 Permanent atrial fibrillation: Secondary | ICD-10-CM

## 2024-02-02 DIAGNOSIS — I110 Hypertensive heart disease with heart failure: Secondary | ICD-10-CM

## 2024-02-02 DIAGNOSIS — Z79899 Other long term (current) drug therapy: Secondary | ICD-10-CM | POA: Diagnosis not present

## 2024-02-02 DIAGNOSIS — I255 Ischemic cardiomyopathy: Secondary | ICD-10-CM | POA: Insufficient documentation

## 2024-02-02 DIAGNOSIS — G4733 Obstructive sleep apnea (adult) (pediatric): Secondary | ICD-10-CM | POA: Diagnosis not present

## 2024-02-02 DIAGNOSIS — J849 Interstitial pulmonary disease, unspecified: Secondary | ICD-10-CM | POA: Insufficient documentation

## 2024-02-02 DIAGNOSIS — C9110 Chronic lymphocytic leukemia of B-cell type not having achieved remission: Secondary | ICD-10-CM | POA: Diagnosis not present

## 2024-02-02 DIAGNOSIS — I5022 Chronic systolic (congestive) heart failure: Secondary | ICD-10-CM | POA: Insufficient documentation

## 2024-02-02 DIAGNOSIS — C3432 Malignant neoplasm of lower lobe, left bronchus or lung: Secondary | ICD-10-CM | POA: Diagnosis not present

## 2024-02-02 DIAGNOSIS — J449 Chronic obstructive pulmonary disease, unspecified: Secondary | ICD-10-CM | POA: Diagnosis not present

## 2024-02-02 DIAGNOSIS — Z87891 Personal history of nicotine dependence: Secondary | ICD-10-CM | POA: Insufficient documentation

## 2024-02-02 HISTORY — DX: Dyspnea, unspecified: R06.00

## 2024-02-02 HISTORY — DX: Acute myocardial infarction, unspecified: I21.9

## 2024-02-02 HISTORY — PX: BRONCHIAL NEEDLE ASPIRATION BIOPSY: SHX5106

## 2024-02-02 HISTORY — DX: Sleep apnea, unspecified: G47.30

## 2024-02-02 HISTORY — PX: BRONCHIAL BIOPSY: SHX5109

## 2024-02-02 HISTORY — PX: BRONCHIAL WASHINGS: SHX5105

## 2024-02-02 HISTORY — PX: VIDEO BRONCHOSCOPY WITH ENDOBRONCHIAL ULTRASOUND: SHX6177

## 2024-02-02 LAB — CBC
HCT: 46.1 % (ref 39.0–52.0)
Hemoglobin: 14.5 g/dL (ref 13.0–17.0)
MCH: 27.4 pg (ref 26.0–34.0)
MCHC: 31.5 g/dL (ref 30.0–36.0)
MCV: 87 fL (ref 80.0–100.0)
Platelets: 124 K/uL — ABNORMAL LOW (ref 150–400)
RBC: 5.3 MIL/uL (ref 4.22–5.81)
RDW: 14.3 % (ref 11.5–15.5)
WBC: 16 K/uL — ABNORMAL HIGH (ref 4.0–10.5)
nRBC: 0.9 % — ABNORMAL HIGH (ref 0.0–0.2)

## 2024-02-02 LAB — BASIC METABOLIC PANEL WITH GFR
Anion gap: 9 (ref 5–15)
BUN: 18 mg/dL (ref 8–23)
CO2: 25 mmol/L (ref 22–32)
Calcium: 9.2 mg/dL (ref 8.9–10.3)
Chloride: 105 mmol/L (ref 98–111)
Creatinine, Ser: 0.9 mg/dL (ref 0.61–1.24)
GFR, Estimated: >60 mL/min
Glucose, Bld: 110 mg/dL — ABNORMAL HIGH (ref 70–99)
Potassium: 4.6 mmol/L (ref 3.5–5.1)
Sodium: 139 mmol/L (ref 135–145)

## 2024-02-02 MED ORDER — PROPOFOL 500 MG/50ML IV EMUL
INTRAVENOUS | Status: DC | PRN
  Administered 2024-02-02: 150 ug/kg/min via INTRAVENOUS

## 2024-02-02 MED ORDER — OXYCODONE HCL 5 MG PO TABS: 5.0000 mg | ORAL_TABLET | Freq: Once | ORAL | Status: DC | PRN

## 2024-02-02 MED ORDER — DROPERIDOL 2.5 MG/ML IJ SOLN: 0.6250 mg | Freq: Once | INTRAMUSCULAR | Status: DC | PRN

## 2024-02-02 MED ORDER — SUGAMMADEX SODIUM 200 MG/2ML IV SOLN
INTRAVENOUS | Status: DC | PRN
  Administered 2024-02-02: 200 mg via INTRAVENOUS

## 2024-02-02 MED ORDER — ROCURONIUM BROMIDE 10 MG/ML (PF) SYRINGE
PREFILLED_SYRINGE | INTRAVENOUS | Status: DC | PRN
  Administered 2024-02-02: 50 mg via INTRAVENOUS

## 2024-02-02 MED ORDER — RIVAROXABAN 20 MG PO TABS: 20.0000 mg | ORAL_TABLET | Freq: Every day | ORAL | Status: AC

## 2024-02-02 MED ORDER — OXYCODONE HCL 5 MG/5ML PO SOLN: 5.0000 mg | Freq: Once | ORAL | Status: DC | PRN

## 2024-02-02 MED ORDER — LACTATED RINGERS IV SOLN: INTRAVENOUS | Status: DC

## 2024-02-02 MED ORDER — EPINEPHRINE 1 MG/10ML IV SOSY
PREFILLED_SYRINGE | INTRAVENOUS | Status: AC
  Filled 2024-02-02: qty 10

## 2024-02-02 MED ORDER — DEXAMETHASONE SOD PHOSPHATE PF 10 MG/ML IJ SOLN
INTRAMUSCULAR | Status: DC | PRN
  Administered 2024-02-02: 10 mg via INTRAVENOUS

## 2024-02-02 MED ORDER — LIDOCAINE 2% (20 MG/ML) 5 ML SYRINGE
INTRAMUSCULAR | Status: DC | PRN
  Administered 2024-02-02: 100 mg via INTRAVENOUS

## 2024-02-02 MED ORDER — PHENYLEPHRINE HCL-NACL 20-0.9 MG/250ML-% IV SOLN
INTRAVENOUS | Status: DC | PRN
  Administered 2024-02-02: 50 ug/min via INTRAVENOUS

## 2024-02-02 MED ORDER — PHENYLEPHRINE 80 MCG/ML (10ML) SYRINGE FOR IV PUSH (FOR BLOOD PRESSURE SUPPORT)
PREFILLED_SYRINGE | INTRAVENOUS | Status: DC | PRN
  Administered 2024-02-02 (×2): 80 ug via INTRAVENOUS

## 2024-02-02 MED ORDER — PROPOFOL 10 MG/ML IV BOLUS
INTRAVENOUS | Status: DC | PRN
  Administered 2024-02-02: 40 mg via INTRAVENOUS
  Administered 2024-02-02: 100 mg via INTRAVENOUS

## 2024-02-02 MED ORDER — FENTANYL CITRATE (PF) 100 MCG/2ML IJ SOLN: 25.0000 ug | INTRAMUSCULAR | Status: DC | PRN

## 2024-02-02 MED ORDER — EPINEPHRINE 1 MG/10ML IV SOSY
PREFILLED_SYRINGE | INTRAVENOUS | Status: DC | PRN
  Administered 2024-02-02: 4 mL via ENDOTRACHEOPULMONARY
  Administered 2024-02-02: 2 mL via ENDOTRACHEOPULMONARY

## 2024-02-02 MED ORDER — ONDANSETRON HCL 4 MG/2ML IJ SOLN
INTRAMUSCULAR | Status: DC | PRN
  Administered 2024-02-02: 4 mg via INTRAVENOUS

## 2024-02-02 MED ORDER — CHLORHEXIDINE GLUCONATE 0.12 % MT SOLN
15.0000 mL | Freq: Once | OROMUCOSAL | Status: AC
  Administered 2024-02-02: 15 mL via OROMUCOSAL

## 2024-02-02 NOTE — Anesthesia Postprocedure Evaluation (Signed)
"   Anesthesia Post Note  Patient: ANDREY MCCASKILL  Procedure(s) Performed: BRONCHOSCOPY, WITH EBUS (Left) BRONCHOSCOPY, WITH BIOPSY BRONCHOSCOPY, WITH NEEDLE ASPIRATION BIOPSY IRRIGATION, BRONCHUS     Patient location during evaluation: PACU Anesthesia Type: General Level of consciousness: awake and alert Pain management: pain level controlled Vital Signs Assessment: post-procedure vital signs reviewed and stable Respiratory status: spontaneous breathing, nonlabored ventilation, respiratory function stable and patient connected to nasal cannula oxygen  Cardiovascular status: blood pressure returned to baseline and stable Postop Assessment: no apparent nausea or vomiting Anesthetic complications: no   No notable events documented.  Last Vitals:  Vitals:   02/02/24 0915 02/02/24 0930  BP: (!) 105/58 (!) 112/58  Pulse: 86 78  Resp: 15 16  Temp:    SpO2: 93% 94%    Last Pain:  Vitals:   02/02/24 0930  TempSrc:   PainSc: 0-No pain                 Thom JONELLE Peoples      "

## 2024-02-02 NOTE — Anesthesia Procedure Notes (Signed)
 Procedure Name: Intubation Date/Time: 02/02/2024 7:37 AM  Performed by: Zelphia Norleen HERO, CRNAPre-anesthesia Checklist: Patient identified, Emergency Drugs available, Suction available and Patient being monitored Patient Re-evaluated:Patient Re-evaluated prior to induction Oxygen  Delivery Method: Circle system utilized Preoxygenation: Pre-oxygenation with 100% oxygen  Induction Type: IV induction Ventilation: Mask ventilation without difficulty Laryngoscope Size: Mac and 3 Grade View: Grade I Tube type: Oral Tube size: 9.0 mm Number of attempts: 1 Airway Equipment and Method: Stylet Placement Confirmation: ETT inserted through vocal cords under direct vision, positive ETCO2 and breath sounds checked- equal and bilateral Secured at: 24 cm Tube secured with: Tape Dental Injury: Teeth and Oropharynx as per pre-operative assessment

## 2024-02-02 NOTE — Discharge Instructions (Addendum)
 Flexible Bronchoscopy, Care After This sheet gives you information about how to care for yourself after your test. Your doctor may also give you more specific instructions. If you have problems or questions, contact your doctor. Follow these instructions at home: Eating and drinking When you are wide awake, your numbness is gone and your cough and gag reflexes have come back, you may: Start eating only soft foods. Slowly drink liquids. Six hours after the test, go back to your normal diet. Driving Do not drive for 24 hours if you were given a medicine to help you relax (sedative). Do not drive or use heavy machinery while taking prescription pain medicine. General instructions Take over-the-counter and prescription medicines only as told by your doctor. Return to your normal activities as told. Ask what activities are safe for you. Do not use any products that have nicotine or tobacco in them. This includes cigarettes and e-cigarettes. If you need help quitting, ask your doctor. Keep all follow-up visits as told by your doctor. This is important. It is very important if you had a tissue sample (biopsy) taken. Get help right away if: You have shortness of breath that gets worse. You get light-headed. You feel like you are going to pass out (faint). You have chest pain. You cough up: More than a little blood. More blood than before. Summary Do not use cigarettes. Do not use e-cigarettes. Seek care in the Emergency Department right away if you have chest pain or shortness of breath. Call or MyChart Message our office for any questions or problems at [phone].  Okay to restart your Xarelto  and Jardiance  on 02/03/2024   This information is not intended to replace advice given to you by your health care provider. Make sure you discuss any questions you have with your health care provider.

## 2024-02-02 NOTE — Op Note (Signed)
 Video Bronchoscopy with Endobronchial Ultrasound Procedure Note  Date of Operation: 02/02/2024  Pre-op Diagnosis: Right lower lobe mass, mediastinal adenopathy  Post-op Diagnosis: Same  Surgeon: LAMAR CHRIS  Assistants: None  Anesthesia: General endotracheal anesthesia  Operation: Flexible video fiberoptic bronchoscopy with endobronchial ultrasound and biopsies.  Estimated Blood Loss: Minimal  Complications: None apparent  Indications and History: Rodney Burns is a 79 y.o. male with history of tobacco use, ILD, CAD.  He was found to have a right lower lobe perihilar mass and mediastinal adenopathy on CT scan of the chest with associated hypermetabolism on subsequent PET scan.  Recommendation made to achieve a tissue diagnosis via endobronchial ultrasound with biopsies.  The risks, benefits, complications, treatment options and expected outcomes were discussed with the patient.  The possibilities of pneumothorax, pneumonia, reaction to medication, pulmonary aspiration, perforation of a viscus, bleeding, failure to diagnose a condition and creating a complication requiring transfusion or operation were discussed with the patient who freely signed the consent.    Description of Procedure: The patient was examined in the preoperative area and history and data from the preprocedure consultation were reviewed. It was deemed appropriate to proceed.  The patient was taken to [facility] Endoscopy room 3, identified as Rodney Burns and the procedure verified as Flexible Video Fiberoptic Bronchoscopy.  A Time Out was held and the above information confirmed. After being taken to the operating room general anesthesia was initiated and the patient  was orally intubated. The video fiberoptic bronchoscope was introduced via the endotracheal tube and a general inspection was performed which showed normal airways on the right.  The left lower lobe airways were somewhat thickened and edematous.  There was some  irregular mucosa at the orifice of the superior segment.  Endobronchial forceps biopsies were performed in the left lower lobe at the superior segment to be sent for cytology. The standard scope was then withdrawn and the endobronchial ultrasound was used to identify and characterize the peritracheal, hilar and bronchial lymph nodes. Inspection showed enlargement at station 4L, 10L, 11L. Using real-time ultrasound guidance Wang needle biopsies were take from Station 4L, 10L, 11L nodes and were sent for cytology. The patient tolerated the procedure well without apparent complications. There was no significant blood loss. The bronchoscope was withdrawn. Anesthesia was reversed and the patient was taken to the PACU for recovery.   Samples: 1.  Endobronchial forceps biopsies superior segment left lower lobe 2. Wang needle biopsies from 4L node 3. Wang needle biopsies from 10L node 4. Wang needle biopsies from 11L node  Plans:  The patient will be discharged from the PACU to home when recovered from anesthesia. We will review the cytology, pathology and microbiology results with the patient when they become available. Outpatient followup will be with Rodney Lites, NP, Dr Theophilus.    CHRIS LAMAR S. 02/02/2024

## 2024-02-02 NOTE — H&P (Signed)
 Rodney Burns is an 79 y.o. male.   Chief Complaint: Left lower lobe mass, mediastinal adenopathy HPI: 79 year old gentleman with a history of former tobacco use (60 pack years), CAD, atrial fibrillation, hypertension, hyperlipidemia, ischemic cardiomyopathy.  He has been followed in our office by Dr. Theophilus for COPD and ILD post-COVID.  Also by Dr. Neda for OSA on BiPAP.  He had a high-resolution CT scan of the chest 12/30/2023 that showed a new left lower lobe mass concerning for primary lung malignancy.  A PET scan was done 01/19/2024 that confirmed a hypermetabolic left lower lobe mass with adjacent adenopathy, mediastinal adenopathy, hypermetabolic lytic lesion of the left ninth rib concerning for met. He presents now for bronchoscopy to obtain a tissue diagnosis.  He was seen by Dr. Jordan in the cardiology office preoperatively.  He has been off his Xarelto  for 2 days.  No new issues reported.  Past Medical History:  Diagnosis Date   Atrial tachycardia    a. s/p DCCV 09/2009; b. s/p RFCA 03/2011; c. s/p DCCV 04/2011; d. s/p RFCA 09/17/11.   Cataract    Chronic systolic CHF (congestive heart failure) (HCC)    a. 01/2015 Echo: EF 40-45%, antsept/infsept HK, triv AI, mild MR, mod dil LA, nl RV, mildly dil RA.   CLL (chronic lymphoblastic leukemia)    Stage 0-1   Coronary artery disease involving native coronary artery without angina pectoris    a. Status post CABG in 1994:By Dr. Lucas. LIMA - L Cx, seqSVG-Diag-LAD, and SVG-PDA-PL.   Cough    Consistant with ACE inhibitor mediated cough   Dyspnea    Dysrhythmia    Glaucoma (increased eye pressure) 1991   History of tobacco abuse    quit 1994   Hypercholesterolemia    Well Controlled   Hypertension    Ischemic cardiomyopathy    a. 02/2011 Echo: EF 40-45%;  b. 01/2015 Echo: EF 40-45%.   Malaria 1972   Hx of   Myocardial infarction (HCC)    Pericarditis    a. 01/2015-->Treated w/ ibuprofen /colchicine .   Sleep apnea    Sleep-disordered  breathing 06/20/2011    Past Surgical History:  Procedure Laterality Date   ATRIAL FLUTTER ABLATION N/A 04/11/2011   Procedure: ATRIAL FLUTTER ABLATION;  Surgeon: Elspeth JAYSON Sage, MD;  Location: [facility] CATH LAB;  Service: Cardiovascular;  Laterality: N/A;   ATRIAL FLUTTER ABLATION N/A 09/17/2011   Procedure: ATRIAL FLUTTER ABLATION;  Surgeon: Lynwood Rakers, MD;  Location: [facility] CATH LAB;  Service: Cardiovascular;  Laterality: N/A;   CARDIOVERSION  06/03/2011   Procedure: CARDIOVERSION;  Surgeon: Elspeth JAYSON Sage, MD;  Location: [facility] OR;  Service: Cardiovascular;  Laterality: N/A;   CATARACT EXTRACTION     COLONOSCOPY WITH PROPOFOL  N/A 09/26/2021   Procedure: COLONOSCOPY WITH PROPOFOL ;  Surgeon: Burnette Fallow, MD;  Location: [facility] ENDOSCOPY;  Service: Gastroenterology;  Laterality: N/A;   CORONARY ARTERY BYPASS GRAFT  1994   By Dr. Lucas. LIMA - L Cx, seqSVG-Diag-LAD, and SVG-PDA-PL   ELECTROPHYSIOLOGY STUDY N/A 04/11/2011   Procedure: ELECTROPHYSIOLOGY STUDY;  Surgeon: Elspeth JAYSON Sage, MD;  Location: [facility] CATH LAB;  Service: Cardiovascular;  Laterality: N/A;   EP study and ablation  2013   by Dr Sage, repeat ablation by Dr Rakers   NM MYOVIEW  LTD  03/2014   scar in the inferior and anteroapical regions without ischemia. This is similar to finding on Myoview  in 2013. EF is a little lower 39%>>32%.   TRIGGER FINGER RELEASE  12/2016  left hand    US  ECHOCARDIOGRAPHY  09-07-09; 02/2011   a. Est EF 40-45%; b. EF 40-45%. Gr 2 DD. Mild LA dil    Family History  Problem Relation Age of Onset   Heart failure Father    Cancer Brother        colon   Down syndrome Son    Social History:  reports that he quit smoking about 32 years ago. His smoking use included cigarettes. He started smoking about 62 years ago. He has a 60 pack-year smoking history. He has never used smokeless tobacco. He reports that he does not drink alcohol and does not use drugs.  Allergies: Allergies[1]  Medications Prior to Admission   Medication Sig Dispense Refill   acetaminophen  (TYLENOL ) 325 MG tablet Take 325 mg by mouth every 6 (six) hours as needed for mild pain or moderate pain.     docusate sodium  (COLACE) 100 MG capsule Take 100 mg by mouth 2 (two) times daily.     empagliflozin  (JARDIANCE ) 10 MG TABS tablet Take 1 tablet (10 mg total) by mouth daily before breakfast. 90 tablet 3   ezetimibe -simvastatin  (VYTORIN ) 10-20 MG tablet TAKE 1 TABLET AT BEDTIME (KEEP UPCOMING APPOINTMENT FOR REFILLS) 90 tablet 3   Fiber, Guar Gum, CHEW Chew 3 each by mouth daily.     fluticasone  (FLONASE ) 50 MCG/ACT nasal spray USE 2 SPRAYS IN EACH NOSTRIL DAILY 16 g 11   Fluticasone -Umeclidin-Vilant (TRELEGY ELLIPTA ) 100-62.5-25 MCG/ACT AEPB Inhale 1 puff into the lungs daily. Use 1 inhalation daily 180 each 4   furosemide  (LASIX ) 20 MG tablet TAKE 1 TABLET DAILY (MUST KEEP UPCOMING OFFICE VISIT FOR FURTHER REFILLS) 90 tablet 2   ketoconazole (NIZORAL) 2 % cream Apply topically 2 (two) times daily.     latanoprost  (XALATAN ) 0.005 % ophthalmic solution Place 1 drop into both eyes at bedtime.     Melatonin 5 MG CHEW Chew 5 mg by mouth at bedtime.     RABEprazole  (ACIPHEX ) 20 MG tablet Take 1 tablet (20 mg total) by mouth daily. 90 tablet 3   sacubitril -valsartan  (ENTRESTO ) 24-26 MG Take 1 tablet by mouth 2 (two) times daily. 180 tablet 2   albuterol  (VENTOLIN  HFA) 108 (90 Base) MCG/ACT inhaler Inhale 2 puffs into the lungs every 6 (six) hours as needed for wheezing or shortness of breath. 8 g 3   guaiFENesin  (MUCINEX ) 600 MG 12 hr tablet Take 600 mg by mouth as needed.     loratadine  (CLARITIN ) 10 MG tablet TAKE 1 TABLET DAILY 90 tablet 3   XARELTO  20 MG TABS tablet TAKE 1 TABLET DAILY WITH SUPPER 90 tablet 3    Results for orders placed or performed during the hospital encounter of 02/02/24 (from the past 48 hours)  Basic metabolic panel per protocol     Status: Abnormal   Collection Time: 02/02/24  5:55 AM  Result Value Ref Range    Sodium 139 135 - 145 mmol/L   Potassium 4.6 3.5 - 5.1 mmol/L   Chloride 105 98 - 111 mmol/L   CO2 25 22 - 32 mmol/L   Glucose, Bld 110 (H) 70 - 99 mg/dL    Comment: Glucose reference range applies only to samples taken after fasting for at least 8 hours.   BUN 18 8 - 23 mg/dL   Creatinine, Ser 9.09 0.61 - 1.24 mg/dL   Calcium 9.2 8.9 - 89.6 mg/dL   GFR, Estimated >39 >39 mL/min    Comment: (NOTE) Calculated using the  CKD-EPI Creatinine Equation (2021)    Anion gap 9 5 - 15    Comment: Performed at [facility], [address], [place], [place] [address]  CBC per protocol     Status: Abnormal   Collection Time: 02/02/24  5:55 AM  Result Value Ref Range   WBC 16.0 (H) 4.0 - 10.5 K/uL   RBC 5.30 4.22 - 5.81 MIL/uL   Hemoglobin 14.5 13.0 - 17.0 g/dL   HCT 53.8 60.9 - 47.9 %   MCV 87.0 80.0 - 100.0 fL   MCH 27.4 26.0 - 34.0 pg   MCHC 31.5 30.0 - 36.0 g/dL   RDW 85.6 88.4 - 84.4 %   Platelets 124 (L) 150 - 400 K/uL   nRBC 0.9 (H) 0.0 - 0.2 %    Comment: Performed at [facility], [address], [place], [place] [address]   No results found.  Review of Systems As per HPI Blood pressure 131/82, pulse 70, temperature 97.6 F (36.4 C), temperature source Oral, resp. rate 17, height 5' 10 (1.778 m), weight 86.2 kg, SpO2 94%. Physical Exam  Gen: Pleasant, well-nourished, elderly gentleman in no distress,  normal affect  ENT: No lesions,  mouth clear,  oropharynx clear, no postnasal drip  Neck: No JVD, no stridor  Lungs: No use of accessory muscles, distant, no crackles or wheezing on normal respiration, no wheeze on forced expiration  Cardiovascular: Irregularly irregular, distant, no murmur heard  Abdomen: soft and NT, no HSM,  BS normal  Musculoskeletal: No deformities, no cyanosis or clubbing  Neuro: alert, awake, non focal  Skin: Warm, no lesions or rashes    Assessment/Plan Left lower lobe mass with left mediastinal adenopathy concerning for primary  lung malignancy in a patient with a history of tobacco use.  Plan is for bronchoscopy with EBUS to obtain a tissue diagnosis.  The patient has been off his Xarelto  for 2 days.  He understands the plan, rationale, risks and benefits.  He agrees to proceed.  No barriers identified.  Lamar GORMAN Chris, MD 02/02/2024, 7:21 AM       [1]  Allergies Allergen Reactions   Penicillins Other (See Comments)    Put pt in hospital 50 yrs ago   Ace Inhibitors Cough   Darvocet [Propoxyphene N-Acetaminophen ] Rash    Tolerates tylenol    Other Rash   Sulfa Antibiotics Rash

## 2024-02-02 NOTE — Transfer of Care (Signed)
 Immediate Anesthesia Transfer of Care Note  Patient: Rodney Burns  Procedure(s) Performed: BRONCHOSCOPY, WITH EBUS (Left) BRONCHOSCOPY, WITH BIOPSY BRONCHOSCOPY, WITH NEEDLE ASPIRATION BIOPSY IRRIGATION, BRONCHUS  Patient Location: PACU  Anesthesia Type:General  Level of Consciousness: awake, alert , and oriented  Airway & Oxygen  Therapy: Patient Spontanous Breathing  Post-op Assessment: Report given to RN and Post -op Vital signs reviewed and stable  Post vital signs: Reviewed and stable  Last Vitals:  Vitals Value Taken Time  BP 120/80   Temp 98   Pulse 80   Resp 16   SpO2 98     Last Pain:  Vitals:   02/02/24 0617  TempSrc:   PainSc: 0-No pain         Complications: No notable events documented.

## 2024-02-04 LAB — CYTOLOGY - NON PAP

## 2024-02-10 ENCOUNTER — Ambulatory Visit: Admitting: Pulmonary Disease

## 2024-02-10 ENCOUNTER — Encounter: Payer: Self-pay | Admitting: Pulmonary Disease

## 2024-02-10 VITALS — BP 113/64 | HR 98 | Temp 97.7°F | Ht 70.0 in | Wt 190.0 lb

## 2024-02-10 DIAGNOSIS — U099 Post covid-19 condition, unspecified: Secondary | ICD-10-CM

## 2024-02-10 DIAGNOSIS — J189 Pneumonia, unspecified organism: Secondary | ICD-10-CM

## 2024-02-10 DIAGNOSIS — G4730 Sleep apnea, unspecified: Secondary | ICD-10-CM | POA: Diagnosis not present

## 2024-02-10 DIAGNOSIS — C779 Secondary and unspecified malignant neoplasm of lymph node, unspecified: Secondary | ICD-10-CM

## 2024-02-10 DIAGNOSIS — Z87891 Personal history of nicotine dependence: Secondary | ICD-10-CM | POA: Diagnosis not present

## 2024-02-10 DIAGNOSIS — J849 Interstitial pulmonary disease, unspecified: Secondary | ICD-10-CM

## 2024-02-10 DIAGNOSIS — C3490 Malignant neoplasm of unspecified part of unspecified bronchus or lung: Secondary | ICD-10-CM | POA: Diagnosis not present

## 2024-02-10 DIAGNOSIS — C3492 Malignant neoplasm of unspecified part of left bronchus or lung: Secondary | ICD-10-CM

## 2024-02-10 DIAGNOSIS — C3491 Malignant neoplasm of unspecified part of right bronchus or lung: Secondary | ICD-10-CM

## 2024-02-10 DIAGNOSIS — J90 Pleural effusion, not elsewhere classified: Secondary | ICD-10-CM

## 2024-02-10 NOTE — Patient Instructions (Signed)
" °  VISIT SUMMARY: During your visit, we discussed your new diagnosis of stage IV lung adenocarcinoma and your existing pulmonary fibrosis. We reviewed your recent imaging results and discussed the next steps for your treatment.  YOUR PLAN: STAGE IV LUNG ADENOCARCINOMA WITH METASTASES: You have been diagnosed with stage IV lung adenocarcinoma, which has spread to your lymph nodes and left rib. -You have been referred to Dr. Deatrice, a lung cancer specialist, for further evaluation and management. -An MRI of your head has been ordered to check for any spread of cancer to your brain. -We are coordinating with Dr. Sherrod to start your treatment as soon as possible.  PULMONARY FIBROSIS: Your pulmonary fibrosis is likely related to your past COVID-19 infection or smoking history. The scarring in your lungs has not changed. -We will continue to monitor your pulmonary fibrosis.    Contains text generated by [employer].   "

## 2024-02-10 NOTE — Progress Notes (Signed)
 "              Rodney Burns    [id number]    1945-05-02  Primary Care Physician:Welborn, Bernardino, DO  Referring Physician: Dayna Bernardino, DO [address] 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 male with PMHx of CLL, chronic atrial fibrillation-status post ablation-on Xarelto , history of CAD-s/p CABG 1994, chronic systolic heart failure, HTN, HLD.    Specialized from 12/3 to 12/30/2018 with COVID-19 pneumonia.  He was treated with steroids and convalescent plasma.  He did not get remdesivir or Actemra due to severe transaminitis.  Discharged on 2 L oxygen  Continues to have significant dyspnea on exertion.  Follow-up chest x-ray 12/28 with worsening bilateral infiltrates and was referred to pulmonary for further evaluation.  He has history of CLL, currently on monitoring under the care of Dr. Odean  Interim history: Discussed the use of AI scribe software for clinical note transcription with the patient, who gave verbal consent to proceed.  History of Present Illness Rodney Burns is a 79 year old male with pulmonary fibrosis who presents with a new diagnosis of stage IV adenocarcinoma of the lung.   Pulmonary fibrosis and lung nodule - Pulmonary fibrosis with recent imaging revealing a suspicious lung nodule. - No current respiratory symptoms.  Lung adenocarcinoma - New diagnosis of stage IV adenocarcinoma of the lung confirmed by PET scan and bronchoscopy on 02/02/2024.  Hemoptysis - Hemoptysis occurred after bronchoscopy and has resolved. - No ongoing hemoptysis.   Relevant pulmonary history Pets: No pets, no birds Occupation: Retired medical illustrator for medical sales representative Exposures: No known exposures.  No mold, hot tub, Jacuzzi.  No feather pillows or comforters  Smoking history: 60-pack-year smoker.  Quit in 1994 Travel history: No significant recent travel  history Relevant family history: No significant family history of lung disease  Outpatient Encounter Medications as of 02/10/2024  Medication Sig   acetaminophen  (TYLENOL ) 325 MG tablet Take 325 mg by mouth every 6 (six) hours as needed for mild pain or moderate pain.   albuterol  (VENTOLIN  HFA) 108 (90 Base) MCG/ACT inhaler Inhale 2 puffs into the lungs every 6 (six) hours as needed for wheezing or shortness of breath.   docusate sodium  (COLACE) 100 MG capsule Take 100 mg by mouth 2 (two) times daily.   empagliflozin  (JARDIANCE ) 10 MG TABS tablet Take 1 tablet (10 mg total) by mouth daily before breakfast.   ezetimibe -simvastatin  (VYTORIN ) 10-20 MG tablet TAKE 1 TABLET AT BEDTIME (KEEP UPCOMING APPOINTMENT FOR REFILLS)   Fiber, Guar Gum, CHEW Chew 3 each by mouth daily.   fluticasone  (FLONASE ) 50 MCG/ACT nasal spray USE 2 SPRAYS IN EACH NOSTRIL DAILY   Fluticasone -Umeclidin-Vilant (TRELEGY ELLIPTA ) 100-62.5-25 MCG/ACT AEPB Inhale 1 puff into the lungs daily. Use 1 inhalation daily   furosemide  (LASIX ) 20 MG tablet TAKE 1 TABLET DAILY (MUST KEEP UPCOMING OFFICE VISIT FOR FURTHER REFILLS)   guaiFENesin  (MUCINEX ) 600 MG 12 hr tablet Take 600 mg by mouth as needed.   ketoconazole (NIZORAL) 2 % cream Apply topically 2 (two) times daily.   latanoprost  (XALATAN ) 0.005 % ophthalmic solution Place 1 drop into both eyes at bedtime.   loratadine  (CLARITIN ) 10 MG tablet TAKE 1 TABLET DAILY   Melatonin 5 MG CHEW Chew 5 mg by mouth at bedtime.   RABEprazole  (ACIPHEX ) 20 MG tablet Take 1 tablet (20 mg total) by mouth daily.  rivaroxaban  (XARELTO ) 20 MG TABS tablet Take 1 tablet (20 mg total) by mouth daily with supper. Okay to restart this medication on 02/03/2024   sacubitril -valsartan  (ENTRESTO ) 24-26 MG Take 1 tablet by mouth 2 (two) times daily.   No facility-administered encounter medications on file as of 02/10/2024.   Vitals:   02/10/24 1319  BP: 113/64  Pulse: 98  Temp: 97.7 F (36.5 C)   Height: 5' 10 (1.778 m)  Weight: 190 lb (86.2 kg)  SpO2: 98%  TempSrc: Oral  BMI (Calculated): 27.26     Physical Exam GEN: No acute distress. CV: Regular rate and rhythm, no murmurs. LUNGS: Clear to auscultation bilaterally, normal respiratory effort. SKIN JOINTS: Warm and dry, no rash.    Data Reviewed: Imaging: Chest x-ray 12/26/2018-bilateral interstitial prominence. Chest x-ray 01/18/2019-progression of reticulonodular opacities. High-resolution CT 02/03/2019-moderate pulmonary fibrosis with basal pattern with septal thickening, mild traction bronchiectasis.  No honeycombing.  Probable UIP High-res CT 10/26/2019-stable pulmonary fibrosis in probable UIP pattern CT high-resolution 12/12/2021-stable findings of pulmonary fibrosis and probable UIP pattern CT high-resolution 12/30/2023-pulmonary pattern of interstitial lung disease stable compared to 2024.  Left lower lobe lung mass PET scan 01/19/2024-hypermetabolic left lower lobe with mediastinal lymphadenopathy.  Lytic lesion in the left ninth rib.  Mildly hypermetabolic right lower lobe subpleural nodules. I have reviewed the images personally.  PFTs: 03/29/2019 FVC 3.32 [34%], FEV1 2.59 [80%],/78, TLC 5.78 [79%], DLCO 14.25 [54%] Minimal restriction with moderate diffusion defect.  05/31/2022 FVC 3.51 [82%], FEV1 2.68 [87%], F/F76, TLC 5.73 [80%], DLCO 15.75 [62%] Mild diffusion defect  mMRC  02/10/2019- 3 03/16/2019-3  Sleep: PSG 08/03/2011 Very mild OSA with AHI 9, desats to 86%, significant leg movements suspicious for PLM  Overnight oximetry 02/16/2019 Time less than 88% - 15 minutes, 32 seconds Nadir O2 sat of 83%. Oxygen  desaturation index 27.5  CPAP titration 05/05/2019 Trial CPAP therapy on 14 cmH2O. no need for supplemental oxygen   BiPAP titration 05/16/2021 Trial BiPAP 22/18 Assessment & Plan Stage IV lung adenocarcinoma with metastases Confirmed adenocarcinoma of the lung, likely stage IV due to  metastasis to lymph nodes and left rib. PET scan and bronchoscopy confirmed diagnosis. Potential for targeted therapy based on mutation testing. Discussed potential for treatment response and importance of early intervention.  - Referred to Dr. Sherrod, lung cancer specialist, for further evaluation and management. - Ordered MRI of the head to assess for brain metastasis. - Monitor left effusion.    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  Will continue same.  Plan/Recommendations: Referral to Dr. Sherrod, oncology for lung cancer MRI brain Continue BiPAP, Trelegy.  Follow-up in 6 months  I personally spent a total of 40 minutes in the care of the patient today including getting/reviewing separately obtained history, performing a medically appropriate exam/evaluation, counseling and educating, referring and communicating with other health care professionals, independently interpreting results, communicating results, and coordinating care.   Shalandria Elsbernd MD [facility] 02/10/2024, 1:34 PM  CC: Dayna Motto, DO   "

## 2024-02-11 ENCOUNTER — Ambulatory Visit: Admitting: Acute Care

## 2024-02-12 ENCOUNTER — Other Ambulatory Visit: Payer: Self-pay

## 2024-02-13 ENCOUNTER — Ambulatory Visit (HOSPITAL_COMMUNITY)
Admission: RE | Admit: 2024-02-13 | Discharge: 2024-02-13 | Disposition: A | Source: Ambulatory Visit | Attending: Pulmonary Disease

## 2024-02-13 DIAGNOSIS — C3492 Malignant neoplasm of unspecified part of left bronchus or lung: Secondary | ICD-10-CM | POA: Insufficient documentation

## 2024-02-13 DIAGNOSIS — C3490 Malignant neoplasm of unspecified part of unspecified bronchus or lung: Secondary | ICD-10-CM

## 2024-02-13 MED ORDER — GADOBUTROL 1 MMOL/ML IV SOLN
9.0000 mL | Freq: Once | INTRAVENOUS | Status: AC | PRN
  Administered 2024-02-13: 9 mL via INTRAVENOUS

## 2024-02-13 NOTE — Progress Notes (Signed)
 The proposed treatment discussed in conference is for discussion purpose only and is not a binding recommendation.  The patients have not been physically examined, or presented with their treatment options.  Therefore, final treatment plans cannot be decided.

## 2024-02-16 DIAGNOSIS — C3432 Malignant neoplasm of lower lobe, left bronchus or lung: Secondary | ICD-10-CM | POA: Insufficient documentation

## 2024-02-16 NOTE — Progress Notes (Signed)
 " Radiation Oncology         [phone] ________________________________  Name: Rodney Burns        MRN: [id number]  Date of Service: 02/17/2024 DOB: 01-20-1946  RR:Tzoanmw, Bernardino, DO  Sherrod Sherrod, MD     REFERRING PHYSICIAN: Sherrod Sherrod, MD   DIAGNOSIS: The encounter diagnosis was Malignant neoplasm of lower lobe of left lung (HCC).   HISTORY OF PRESENT ILLNESS: Rodney Burns is a 79 y.o. male seen at the request of Dr. Sherrod for a diagnosis of lung cancer.  The patient has been followed by pulmonary medicine since 2021 after having persistent difficulties with breathing after COVID-19 pneumonia in 2020.  At that time he had moderate pulmonary fibrosis in the apex to basal gradient featuring interstitial septal thickening and mild bronchiectasis and findings were categorized as probable usual interstitial pneumonia.  He was followed over time and has also had mild dilatation of the pulmonic trunk identified radiographically in June 2023.  A CT scan of the chest with high-resolution on 02/11/2021 noted a nodule in the RLL that in retrospect had been stable but increased over remote prior exams.  He was encouraged to follow this closely a CT high-resolution on 01/01/2023 showed persistent pulmonary fibrotic change but no longer mention the nodule.  After having a cough in the fall 2025 a chest x-ray was performed and no acute findings were appreciated, a CT with high-resolution of the chest on 12/30/2023 showed persistent interstitial lung disease and an increase in the nodule originally noted in the LLL now measuring 2.7 x 3.6 cm additional bilateral pulmonary nodules measured up to 1.7 cm in the RLL and a PET scan on 01/19/2024 showed hypermetabolic LLL mass with adjacent conglomerate adenopathy, occlusion of the superior segmental bronchus and narrowing of the LLL bronchus, hypermetabolic left lower paratracheal subcarinal and paraesophageal adenopathy was present, as well as a hypermetabolic  lesion in the left ninth rib concerning for metastatic disease that persisted on CT overlay.  He also had hypermetabolic activity in a subpleural RLL nodule measuring 1.4 cm with an SUV of 3.1, and subsolid pleural nodularity in the LUL measuring 1.9 cm with an SUV of 3.1.  This lesion was felt to be inflammatory versus malignant.  He underwent bronchoscopy on 02/02/2024 the LLL endobronchial biopsy showed non-small cell carcinoma consistent with adenocarcinoma, the 4L fine-needle aspirate showed malignant cells as did the 10L fine-needle aspirate and 11L fine-needle aspirate.  His case was discussed in multidisciplinary thoracic oncology conference, he also underwent an MRI of the brain on 02/13/2024 that did not show metastatic disease.  Recommendations from conference included considering chemoradiation to the LLL and regional lymph nodes to follow the RLL and LUL findings and consider focal radiation to these sites plus or minus the rib.  He also has a history of CLL and is followed by Dr. Odean in surveillance.    PREVIOUS RADIATION THERAPY: {EXAM; YES/NO:19492::No}   PAST MEDICAL HISTORY:  Past Medical History:  Diagnosis Date   Atrial tachycardia    a. s/p DCCV 09/2009; b. s/p RFCA 03/2011; c. s/p DCCV 04/2011; d. s/p RFCA 09/17/11.   Cataract    Chronic systolic CHF (congestive heart failure) (HCC)    a. 01/2015 Echo: EF 40-45%, antsept/infsept HK, triv AI, mild MR, mod dil LA, nl RV, mildly dil RA.   CLL (chronic lymphoblastic leukemia)    Stage 0-1   Coronary artery disease involving native coronary artery without angina pectoris    a.  Status post CABG in 1994:By Dr. Lucas. LIMA - L Cx, seqSVG-Diag-LAD, and SVG-PDA-PL.   Cough    Consistant with ACE inhibitor mediated cough   Dyspnea    Dysrhythmia    Glaucoma (increased eye pressure) 1991   History of tobacco abuse    quit 1994   Hypercholesterolemia    Well Controlled   Hypertension    Ischemic cardiomyopathy    a. 02/2011 Echo:  EF 40-45%;  b. 01/2015 Echo: EF 40-45%.   Malaria 1972   Hx of   Myocardial infarction (HCC)    Pericarditis    a. 01/2015-->Treated w/ ibuprofen /colchicine .   Sleep apnea    Sleep-disordered breathing 06/20/2011       PAST SURGICAL HISTORY: Past Surgical History:  Procedure Laterality Date   ATRIAL FLUTTER ABLATION N/A 04/11/2011   Procedure: ATRIAL FLUTTER ABLATION;  Surgeon: Elspeth JAYSON Sage, MD;  Location: [facility] CATH LAB;  Service: Cardiovascular;  Laterality: N/A;   ATRIAL FLUTTER ABLATION N/A 09/17/2011   Procedure: ATRIAL FLUTTER ABLATION;  Surgeon: Lynwood Rakers, MD;  Location: [facility] CATH LAB;  Service: Cardiovascular;  Laterality: N/A;   BRONCHIAL BIOPSY  02/02/2024   Procedure: BRONCHOSCOPY, WITH BIOPSY;  Surgeon: Shelah Lamar RAMAN, MD;  Location: [facility] ENDOSCOPY;  Service: Cardiopulmonary;;   BRONCHIAL NEEDLE ASPIRATION BIOPSY  02/02/2024   Procedure: BRONCHOSCOPY, WITH NEEDLE ASPIRATION BIOPSY;  Surgeon: Shelah Lamar RAMAN, MD;  Location: [facility] ENDOSCOPY;  Service: Cardiopulmonary;;   BRONCHIAL WASHINGS  02/02/2024   Procedure: IRRIGATION, BRONCHUS;  Surgeon: Shelah Lamar RAMAN, MD;  Location: [facility] ENDOSCOPY;  Service: Cardiopulmonary;;   CARDIOVERSION  06/03/2011   Procedure: CARDIOVERSION;  Surgeon: Elspeth JAYSON Sage, MD;  Location: [facility] OR;  Service: Cardiovascular;  Laterality: N/A;   CATARACT EXTRACTION     COLONOSCOPY WITH PROPOFOL  N/A 09/26/2021   Procedure: COLONOSCOPY WITH PROPOFOL ;  Surgeon: Burnette Fallow, MD;  Location: [facility] ENDOSCOPY;  Service: Gastroenterology;  Laterality: N/A;   CORONARY ARTERY BYPASS GRAFT  1994   By Dr. Lucas. LIMA - L Cx, seqSVG-Diag-LAD, and SVG-PDA-PL   ELECTROPHYSIOLOGY STUDY N/A 04/11/2011   Procedure: ELECTROPHYSIOLOGY STUDY;  Surgeon: Elspeth JAYSON Sage, MD;  Location: [facility] CATH LAB;  Service: Cardiovascular;  Laterality: N/A;   EP study and ablation  2013   by Dr Sage, repeat ablation by Dr Rakers   NM MYOVIEW  LTD  03/2014   scar in the inferior and anteroapical regions  without ischemia. This is similar to finding on Myoview  in 2013. EF is a little lower 39%>>32%.   TRIGGER FINGER RELEASE  12/2016   left hand    US  ECHOCARDIOGRAPHY  09-07-09; 02/2011   a. Est EF 40-45%; b. EF 40-45%. Gr 2 DD. Mild LA dil   VIDEO BRONCHOSCOPY WITH ENDOBRONCHIAL ULTRASOUND Left 02/02/2024   Procedure: BRONCHOSCOPY, WITH EBUS;  Surgeon: Shelah Lamar RAMAN, MD;  Location: [facility] ENDOSCOPY;  Service: Cardiopulmonary;  Laterality: Left;     FAMILY HISTORY:  Family History  Problem Relation Age of Onset   Heart failure Father    Cancer Brother        colon   Down syndrome Son      SOCIAL HISTORY:  reports that he quit smoking about 32 years ago. His smoking use included cigarettes. He started smoking about 62 years ago. He has a 60 pack-year smoking history. He has never used smokeless tobacco. He reports that he does not drink alcohol and does not use drugs.  The patient is married and lives in [place], he***   ALLERGIES:  Penicillins, Ace inhibitors, Darvocet [propoxyphene n-acetaminophen ], Other, and Sulfa antibiotics   MEDICATIONS:  Current Outpatient Medications  Medication Sig Dispense Refill   acetaminophen  (TYLENOL ) 325 MG tablet Take 325 mg by mouth every 6 (six) hours as needed for mild pain or moderate pain.     albuterol  (VENTOLIN  HFA) 108 (90 Base) MCG/ACT inhaler Inhale 2 puffs into the lungs every 6 (six) hours as needed for wheezing or shortness of breath. 8 g 3   docusate sodium  (COLACE) 100 MG capsule Take 100 mg by mouth 2 (two) times daily.     empagliflozin  (JARDIANCE ) 10 MG TABS tablet Take 1 tablet (10 mg total) by mouth daily before breakfast. 90 tablet 3   ezetimibe -simvastatin  (VYTORIN ) 10-20 MG tablet TAKE 1 TABLET AT BEDTIME (KEEP UPCOMING APPOINTMENT FOR REFILLS) 90 tablet 3   Fiber, Guar Gum, CHEW Chew 3 each by mouth daily.     fluticasone  (FLONASE ) 50 MCG/ACT nasal spray USE 2 SPRAYS IN EACH NOSTRIL DAILY 16 g 11   Fluticasone -Umeclidin-Vilant  (TRELEGY ELLIPTA ) 100-62.5-25 MCG/ACT AEPB Inhale 1 puff into the lungs daily. Use 1 inhalation daily 180 each 4   furosemide  (LASIX ) 20 MG tablet TAKE 1 TABLET DAILY (MUST KEEP UPCOMING OFFICE VISIT FOR FURTHER REFILLS) 90 tablet 2   guaiFENesin  (MUCINEX ) 600 MG 12 hr tablet Take 600 mg by mouth as needed.     ketoconazole (NIZORAL) 2 % cream Apply topically 2 (two) times daily.     latanoprost  (XALATAN ) 0.005 % ophthalmic solution Place 1 drop into both eyes at bedtime.     loratadine  (CLARITIN ) 10 MG tablet TAKE 1 TABLET DAILY 90 tablet 3   Melatonin 5 MG CHEW Chew 5 mg by mouth at bedtime.     RABEprazole  (ACIPHEX ) 20 MG tablet Take 1 tablet (20 mg total) by mouth daily. 90 tablet 3   rivaroxaban  (XARELTO ) 20 MG TABS tablet Take 1 tablet (20 mg total) by mouth daily with supper. Okay to restart this medication on 02/03/2024     sacubitril -valsartan  (ENTRESTO ) 24-26 MG Take 1 tablet by mouth 2 (two) times daily. 180 tablet 2   No current facility-administered medications for this visit.     REVIEW OF SYSTEMS: On review of systems, the patient reports that ***      PHYSICAL EXAM:  Wt Readings from Last 3 Encounters:  02/10/24 190 lb (86.2 kg)  02/02/24 190 lb (86.2 kg)  01/27/24 192 lb 12.8 oz (87.5 kg)   Temp Readings from Last 3 Encounters:  02/10/24 97.7 F (36.5 C) (Oral)  02/02/24 98.8 F (37.1 C)  03/26/23 98.1 F (36.7 C) (Temporal)   BP Readings from Last 3 Encounters:  02/10/24 113/64  02/02/24 (!) 112/58  01/27/24 (!) 106/58   Pulse Readings from Last 3 Encounters:  02/10/24 98  02/02/24 78  01/27/24 63    /10  In general this is a well appearing Caucasian male in no acute distress.  He's alert and oriented x4 and appropriate throughout the examination. Cardiopulmonary assessment is negative for acute distress and he exhibits normal effort.     ECOG = ***  0 - Asymptomatic (Fully active, able to carry on all predisease activities without restriction)  1  - Symptomatic but completely ambulatory (Restricted in physically strenuous activity but ambulatory and able to carry out work of a light or sedentary nature. For example, light housework, office work)  2 - Symptomatic, <50% in bed during the day (Ambulatory and capable of all self care but unable  to carry out any work activities. Up and about more than 50% of waking hours)  3 - Symptomatic, >50% in bed, but not bedbound (Capable of only limited self-care, confined to bed or chair 50% or more of waking hours)  4 - Bedbound (Completely disabled. Cannot carry on any self-care. Totally confined to bed or chair)  5 - Death   Raylene MM, Creech RH, Tormey DC, et al. [phone]). Toxicity and response criteria of the [facility]. Am. DOROTHA Bridges. Oncol. 5 (6): 649-55    LABORATORY DATA:  Lab Results  Component Value Date   WBC 16.0 (H) 02/02/2024   HGB 14.5 02/02/2024   HCT 46.1 02/02/2024   MCV 87.0 02/02/2024   PLT 124 (L) 02/02/2024   Lab Results  Component Value Date   NA 139 02/02/2024   K 4.6 02/02/2024   CL 105 02/02/2024   CO2 25 02/02/2024   Lab Results  Component Value Date   ALT 17 03/26/2023   AST 16 03/26/2023   ALKPHOS 34 (L) 03/26/2023   BILITOT 1.2 03/26/2023      RADIOGRAPHY: MR BRAIN W WO CONTRAST Result Date: 02/16/2024 CLINICAL DATA:  New diagnosis adenocarcinoma of the lung EXAM: MRI HEAD WITHOUT AND WITH CONTRAST TECHNIQUE: Multiplanar, multiecho pulse sequences of the brain and surrounding structures were obtained without and with intravenous contrast. CONTRAST:  9mL GADAVIST  GADOBUTROL  1 MMOL/ML IV SOLN COMPARISON:  None Available. FINDINGS: MRI brain: The brain volume is normal. There are several foci of T2 hyperintensity in the cerebral white matter. These do not have restricted diffusion. No abnormal enhancement. There is no acute or chronic infarct. The ventricles are normal. No mass lesion. There are normal flow signals in the carotid  arteries and basilar artery. No significant bone marrow signal abnormality. No significant abnormality in the paranasal sinuses or soft tissues. IMPRESSION: No evidence of metastatic disease or other significant abnormality Electronically Signed   By: Nancyann Burns M.D.   On: 02/16/2024 07:33   NM PET Image Initial (PI) Skull Base To Thigh Result Date: 01/19/2024 EXAM: PET AND CT SKULL BASE TO MID THIGH 01/19/2024 11:49:45 AM TECHNIQUE: RADIOPHARMACEUTICAL: 9.43 mCi F-18 FDG Uptake time 60 minutes. Glucose level 98 mg/dl. Blood pool SUV 1.9. PET imaging was acquired from the base of the skull to the mid thighs. Non-contrast enhanced computed tomography was obtained for attenuation correction and anatomic localization. COMPARISON: Chest CT dated 12/30/2023. CLINICAL HISTORY: Lung nodule, greater than 8 millimeters; Lung nodules, multiple. FINDINGS: HEAD AND NECK: Bilateral common carotid atheromatous vascular calcifications. No metabolically active cervical lymphadenopathy. CHEST: Hypermetabolic left lower lobe mass potentially with adjacent conglomerate adenopathy measures 4.5 x 2.8 cm with maximum SUV 14.9, compatible with malignancy. This results in occlusion of the superior segmental bronchus and narrowing of the left lower lobe bronchus. Left lower paratracheal lymph node 1.3 cm in short axis on image 74 series 4 with maximum SUV 8.8, compatible with malignancy. A left eccentric subcarinal/paraesophageal lymph node is faintly hypermetabolic with maximum SUV 4.9. A hypermetabolic lytic lesion of the left 9th rib with surrounding sclerosis is noted with maximum SUV 4.3, compatible with metastatic lesion. Superior segment right lower lobe nodules are observed with subpleural positioning measuring up to 1.4 x 0.8 cm on image 79 series 4, max SUV 3.1, malignancy not excluded. Subsolid peripheral nodularity in the left upper lobe on image 81 series 4 measures 1.9 x 1.3 cm with maximum SUV 3.1, inflammatory versus  malignant. Thoracic aortic, coronary artery,  and branch vessel atheromatous vascular calcifications. Cardiomegaly and prior CABG. Small to moderate left pleural effusion with passive atelectasis. ABDOMEN AND PELVIS: Photopenic hypodense renal lesions compatible with renal cysts, not requiring further workup. Systemic atherosclerosis is present, including the aorta and iliac arteries. Prostatomegaly. No metabolically active intraperitoneal mass. No metabolically active lymphadenopathy. Physiologic activity within the gastrointestinal and genitourinary systems. BONES AND SOFT TISSUE: A hypermetabolic lytic lesion of the left 9th rib with surrounding sclerosis is noted with maximum SUV 4.3, compatible with metastatic lesion. Degenerative bilateral hip arthropathy. Cervical, thoracic, and lumbar spondylosis. IMPRESSION: 1. Hypermetabolic left lower lobe mass with adjacent conglomerate adenopathy, compatible with malignancy, with occlusion of the superior segmental bronchus and narrowing of the left lower lobe bronchus. 2. Hypermetabolic left lower paratracheal and subcarinal/paraesophageal lymph nodes, compatible with malignancy. 3. Hypermetabolic lytic lesion of the left ninth rib, compatible with metastasis. 4. Mildly hypermetabolic right lower lobe subpleural nodules and subsolid left upper lobe nodularity, indeterminate for malignancy. 5. Small to moderate left pleural effusion with passive atelectasis. 6. Cervical, thoracic, and lumbar spondylosis and degenerative bilateral hip arthropathy. 7. Additional chronic/incidental findings include cardiomegaly with prior coronary artery bypass grafting, systemic atherosclerosis (including coronary, aortic, carotid, and iliac calcifications), and prostatomegaly. Electronically signed by: Ryan Salvage MD 01/19/2024 01:23 PM EST RP Workstation: [id number]       IMPRESSION/PLAN: 1. Stage IV,  oligometastatic NSCLC, adenocarcinoma of the LLL. Dr. Dewey discusses the  pathology findings and reviews the nature of ***  . We discussed the risks, benefits, short, and long term effects of radiotherapy, as well as the curative intent, and the patient is interested in proceeding. Dr. Dewey discusses the delivery and logistics of radiotherapy and anticipates a course of *** weeks of radiotherapy.  ***   In a visit lasting *** minutes, greater than 50% of the time was spent face to face discussing the patient's condition, in preparation for the discussion, and coordinating the patient's care.   The above documentation reflects my direct findings during this shared patient visit. Please see the separate note by Dr. Dewey on this date for the remainder of the patient's plan of care.    Rodney Burns, [facility]   **Disclaimer: This note was dictated with voice recognition software. Similar sounding words can inadvertently be transcribed and this note may contain transcription errors which may not have been corrected upon publication of note.** "

## 2024-02-17 ENCOUNTER — Inpatient Hospital Stay (HOSPITAL_BASED_OUTPATIENT_CLINIC_OR_DEPARTMENT_OTHER): Admitting: Internal Medicine

## 2024-02-17 ENCOUNTER — Ambulatory Visit
Admission: RE | Admit: 2024-02-17 | Discharge: 2024-02-17 | Disposition: A | Discharge status: Home / Self Care | Source: Ambulatory Visit | Attending: Radiation Oncology | Admitting: Radiation Oncology

## 2024-02-17 ENCOUNTER — Inpatient Hospital Stay: Attending: Internal Medicine

## 2024-02-17 ENCOUNTER — Other Ambulatory Visit: Payer: Self-pay | Admitting: Internal Medicine

## 2024-02-17 ENCOUNTER — Encounter: Payer: Self-pay | Admitting: Internal Medicine

## 2024-02-17 ENCOUNTER — Encounter: Payer: Self-pay | Admitting: Radiation Oncology

## 2024-02-17 VITALS — BP 129/63 | HR 70 | Temp 97.7°F | Resp 18 | Wt 192.6 lb

## 2024-02-17 VITALS — BP 109/79 | HR 92 | Temp 97.9°F | Resp 17 | Ht 70.0 in | Wt 192.1 lb

## 2024-02-17 DIAGNOSIS — I255 Ischemic cardiomyopathy: Secondary | ICD-10-CM | POA: Insufficient documentation

## 2024-02-17 DIAGNOSIS — C3432 Malignant neoplasm of lower lobe, left bronchus or lung: Secondary | ICD-10-CM | POA: Insufficient documentation

## 2024-02-17 DIAGNOSIS — Z79899 Other long term (current) drug therapy: Secondary | ICD-10-CM | POA: Insufficient documentation

## 2024-02-17 DIAGNOSIS — Z8616 Personal history of COVID-19: Secondary | ICD-10-CM | POA: Insufficient documentation

## 2024-02-17 DIAGNOSIS — Z800 Family history of malignant neoplasm of digestive organs: Secondary | ICD-10-CM | POA: Insufficient documentation

## 2024-02-17 DIAGNOSIS — I110 Hypertensive heart disease with heart failure: Secondary | ICD-10-CM | POA: Insufficient documentation

## 2024-02-17 DIAGNOSIS — J8410 Pulmonary fibrosis, unspecified: Secondary | ICD-10-CM | POA: Insufficient documentation

## 2024-02-17 DIAGNOSIS — Z7901 Long term (current) use of anticoagulants: Secondary | ICD-10-CM | POA: Insufficient documentation

## 2024-02-17 DIAGNOSIS — Z87891 Personal history of nicotine dependence: Secondary | ICD-10-CM | POA: Insufficient documentation

## 2024-02-17 DIAGNOSIS — I5022 Chronic systolic (congestive) heart failure: Secondary | ICD-10-CM | POA: Insufficient documentation

## 2024-02-17 DIAGNOSIS — I252 Old myocardial infarction: Secondary | ICD-10-CM | POA: Insufficient documentation

## 2024-02-17 DIAGNOSIS — Z7984 Long term (current) use of oral hypoglycemic drugs: Secondary | ICD-10-CM | POA: Insufficient documentation

## 2024-02-17 DIAGNOSIS — Z8613 Personal history of malaria: Secondary | ICD-10-CM | POA: Insufficient documentation

## 2024-02-17 DIAGNOSIS — C9110 Chronic lymphocytic leukemia of B-cell type not having achieved remission: Secondary | ICD-10-CM

## 2024-02-17 DIAGNOSIS — G4730 Sleep apnea, unspecified: Secondary | ICD-10-CM | POA: Insufficient documentation

## 2024-02-17 DIAGNOSIS — C9111 Chronic lymphocytic leukemia of B-cell type in remission: Secondary | ICD-10-CM | POA: Insufficient documentation

## 2024-02-17 DIAGNOSIS — E7800 Pure hypercholesterolemia, unspecified: Secondary | ICD-10-CM | POA: Insufficient documentation

## 2024-02-17 DIAGNOSIS — I2510 Atherosclerotic heart disease of native coronary artery without angina pectoris: Secondary | ICD-10-CM | POA: Insufficient documentation

## 2024-02-17 LAB — CBC WITH DIFFERENTIAL (CANCER CENTER ONLY)
Abs Immature Granulocytes: 0.04 10*3/uL (ref 0.00–0.07)
Basophils Absolute: 0.1 10*3/uL (ref 0.0–0.1)
Basophils Relative: 0 %
Eosinophils Absolute: 0.1 10*3/uL (ref 0.0–0.5)
Eosinophils Relative: 1 %
HCT: 47.9 % (ref 39.0–52.0)
Hemoglobin: 14.8 g/dL (ref 13.0–17.0)
Immature Granulocytes: 0 %
Lymphocytes Relative: 73 %
Lymphs Abs: 12.8 10*3/uL — ABNORMAL HIGH (ref 0.7–4.0)
MCH: 26.9 pg (ref 26.0–34.0)
MCHC: 30.9 g/dL (ref 30.0–36.0)
MCV: 86.9 fL (ref 80.0–100.0)
Monocytes Absolute: 0.6 10*3/uL (ref 0.1–1.0)
Monocytes Relative: 4 %
Neutro Abs: 3.9 10*3/uL (ref 1.7–7.7)
Neutrophils Relative %: 22 %
Platelet Count: 120 10*3/uL — ABNORMAL LOW (ref 150–400)
RBC: 5.51 MIL/uL (ref 4.22–5.81)
RDW: 14.6 % (ref 11.5–15.5)
Smear Review: NORMAL
WBC Count: 17.5 10*3/uL — ABNORMAL HIGH (ref 4.0–10.5)
nRBC: 0 % (ref 0.0–0.2)

## 2024-02-17 LAB — CMP (CANCER CENTER ONLY)
ALT: 15 U/L (ref 0–44)
AST: 17 U/L (ref 15–41)
Albumin: 4.5 g/dL (ref 3.5–5.0)
Alkaline Phosphatase: 38 U/L (ref 38–126)
Anion gap: 10 (ref 5–15)
BUN: 14 mg/dL (ref 8–23)
CO2: 26 mmol/L (ref 22–32)
Calcium: 9.4 mg/dL (ref 8.9–10.3)
Chloride: 108 mmol/L (ref 98–111)
Creatinine: 0.93 mg/dL (ref 0.61–1.24)
GFR, Estimated: >60 mL/min
Glucose, Bld: 79 mg/dL (ref 70–99)
Potassium: 4.4 mmol/L (ref 3.5–5.1)
Sodium: 144 mmol/L (ref 135–145)
Total Bilirubin: 0.6 mg/dL (ref 0.0–1.2)
Total Protein: 6.5 g/dL (ref 6.5–8.1)

## 2024-02-17 LAB — LACTATE DEHYDROGENASE: LDH: 154 U/L (ref 105–235)

## 2024-02-17 MED ORDER — ONDANSETRON HCL 8 MG PO TABS: 8.0000 mg | ORAL_TABLET | Freq: Three times a day (TID) | ORAL | 1 refills | Status: AC | PRN

## 2024-02-17 MED ORDER — PROCHLORPERAZINE MALEATE 10 MG PO TABS: 10.0000 mg | ORAL_TABLET | Freq: Four times a day (QID) | ORAL | 1 refills | Status: AC | PRN

## 2024-02-17 NOTE — Progress Notes (Signed)
 "   [facility] [phone]   [phone]  CONSULT NOTE  REFERRING PHYSICIAN: Dr. Lamar Chris  REASON FOR CONSULTATION:  79 years old white male recently diagnosed with lung cancer  HPI OTHO MICHALIK is a 79 y.o. male.   HPI  Discussed the use of AI scribe software for clinical note transcription with the patient, who gave verbal consent to proceed.  History of Present Illness FERLANDO LIA is a 79 year old male with stage IV non-small cell lung cancer who presents for initial oncology evaluation and discussion of treatment options.  He was diagnosed with non-small cell lung cancer, adenocarcinoma, following annual lung surveillance prompted by persistent respiratory symptoms since a severe COVID-19 infection in November 2020. Imaging in December 2024 revealed a left lower lobe mass (2.7 x 3.6 cm), bilateral pulmonary nodules, a right lower lobe nodule (1.2 x 1.7 cm), mediastinal lymphadenopathy, a left ninth rib lesion, and a small left-sided pleural effusion. PET scan demonstrated hypermetabolic activity in the left lower lobe mass, mediastinal lymph nodes, and left ninth rib. Bronchoscopy with biopsy confirmed adenocarcinoma in the left lower lobe mass and sampled lymph nodes (4L, 10L, 11L). MRI of the brain was negative for metastatic disease.  He experiences significant exertional dyspnea and fatigue, stating he is unable to perform yard work or walk 50 yards without exhaustion. He has chronic midline back pain occurring nightly, previously managed with physical therapy and intermittent use of a back brace. He takes acetaminophen  1300 mg twice daily, with occasional additional doses, for pain control. He denies hemoptysis, nausea, vomiting, or diarrhea. He has occasional chest pain and a chronic productive cough with thick, stringy, mostly clear sputum, requiring frequent use of tissues. He uses Flonase  nasal spray but does not take expectorants. He has  intentionally lost approximately 30 pounds over the past year, reducing his waist size from 40 to 36 inches.  Relevant comorbidities include congestive heart failure, atrial tachycardia and flutter (with two prior cardioversions), coronary artery disease, cardiomyopathy, hypertension, hyperlipidemia, prior myocardial infarction, obstructive sleep apnea, glaucoma, and prior chronic lymphocytic leukemia.  Family history significant for father died from heart disease at age 51.  Mother with questionable lung cancer.  Brother and sister had lung cancer. The patient is married and has 2 children a daughter who is 49 and his son 45.  He used to work in nutritional therapist.  He has a history of smoking for around 20 years but quit 34 years ago.  No history of alcohol or drug abuse.    Past Medical History:  Diagnosis Date   Atrial tachycardia    a. s/p DCCV 09/2009; b. s/p RFCA 03/2011; c. s/p DCCV 04/2011; d. s/p RFCA 09/17/11.   Cataract    Chronic systolic CHF (congestive heart failure) (HCC)    a. 01/2015 Echo: EF 40-45%, antsept/infsept HK, triv AI, mild MR, mod dil LA, nl RV, mildly dil RA.   CLL (chronic lymphoblastic leukemia)    Stage 0-1   Coronary artery disease involving native coronary artery without angina pectoris    a. Status post CABG in 1994:By Dr. Lucas. LIMA - L Cx, seqSVG-Diag-LAD, and SVG-PDA-PL.   Cough    Consistant with ACE inhibitor mediated cough   Dyspnea    Dysrhythmia    Glaucoma (increased eye pressure) 1991   History of tobacco abuse    quit 1994   Hypercholesterolemia    Well Controlled   Hypertension    Ischemic cardiomyopathy  a. 02/2011 Echo: EF 40-45%;  b. 01/2015 Echo: EF 40-45%.   Malaria 1972   Hx of   Myocardial infarction (HCC)    Pericarditis    a. 01/2015-->Treated w/ ibuprofen /colchicine .   Sleep apnea    Sleep-disordered breathing 06/20/2011      Past Surgical History:  Procedure Laterality Date   ATRIAL FLUTTER ABLATION N/A  04/11/2011   Procedure: ATRIAL FLUTTER ABLATION;  Surgeon: Elspeth JAYSON Sage, MD;  Location: [facility] CATH LAB;  Service: Cardiovascular;  Laterality: N/A;   ATRIAL FLUTTER ABLATION N/A 09/17/2011   Procedure: ATRIAL FLUTTER ABLATION;  Surgeon: Lynwood Rakers, MD;  Location: [facility] CATH LAB;  Service: Cardiovascular;  Laterality: N/A;   BRONCHIAL BIOPSY  02/02/2024   Procedure: BRONCHOSCOPY, WITH BIOPSY;  Surgeon: Shelah Lamar RAMAN, MD;  Location: [facility] ENDOSCOPY;  Service: Cardiopulmonary;;   BRONCHIAL NEEDLE ASPIRATION BIOPSY  02/02/2024   Procedure: BRONCHOSCOPY, WITH NEEDLE ASPIRATION BIOPSY;  Surgeon: Shelah Lamar RAMAN, MD;  Location: [facility] ENDOSCOPY;  Service: Cardiopulmonary;;   BRONCHIAL WASHINGS  02/02/2024   Procedure: IRRIGATION, BRONCHUS;  Surgeon: Shelah Lamar RAMAN, MD;  Location: [facility] ENDOSCOPY;  Service: Cardiopulmonary;;   CARDIOVERSION  06/03/2011   Procedure: CARDIOVERSION;  Surgeon: Elspeth JAYSON Sage, MD;  Location: [facility];  Service: Cardiovascular;  Laterality: N/A;   CATARACT EXTRACTION     COLONOSCOPY WITH PROPOFOL  N/A 09/26/2021   Procedure: COLONOSCOPY WITH PROPOFOL ;  Surgeon: Burnette Fallow, MD;  Location: [facility] ENDOSCOPY;  Service: Gastroenterology;  Laterality: N/A;   CORONARY ARTERY BYPASS GRAFT  1994   By Dr. Lucas. LIMA - L Cx, seqSVG-Diag-LAD, and SVG-PDA-PL   ELECTROPHYSIOLOGY STUDY N/A 04/11/2011   Procedure: ELECTROPHYSIOLOGY STUDY;  Surgeon: Elspeth JAYSON Sage, MD;  Location: [facility] CATH LAB;  Service: Cardiovascular;  Laterality: N/A;   EP study and ablation  2013   by Dr Sage, repeat ablation by Dr Rakers   NM MYOVIEW  LTD  03/2014   scar in the inferior and anteroapical regions without ischemia. This is similar to finding on Myoview  in 2013. EF is a little lower 39%>>32%.   TRIGGER FINGER RELEASE  12/2016   left hand    US  ECHOCARDIOGRAPHY  09-07-09; 02/2011   a. Est EF 40-45%; b. EF 40-45%. Gr 2 DD. Mild LA dil   VIDEO BRONCHOSCOPY WITH ENDOBRONCHIAL ULTRASOUND Left 02/02/2024   Procedure: BRONCHOSCOPY,  WITH EBUS;  Surgeon: Shelah Lamar RAMAN, MD;  Location: [facility] ENDOSCOPY;  Service: Cardiopulmonary;  Laterality: Left;    Family History  Problem Relation Age of Onset   Heart failure Father    Cancer Brother        colon   Down syndrome Son     Social History Social History[1]  Allergies[2]  Current Outpatient Medications  Medication Sig Dispense Refill   acetaminophen  (TYLENOL ) 325 MG tablet Take 325 mg by mouth every 6 (six) hours as needed for mild pain or moderate pain.     albuterol  (VENTOLIN  HFA) 108 (90 Base) MCG/ACT inhaler Inhale 2 puffs into the lungs every 6 (six) hours as needed for wheezing or shortness of breath. 8 g 3   docusate sodium  (COLACE) 100 MG capsule Take 100 mg by mouth 2 (two) times daily.     empagliflozin  (JARDIANCE ) 10 MG TABS tablet Take 1 tablet (10 mg total) by mouth daily before breakfast. 90 tablet 3   ezetimibe -simvastatin  (VYTORIN ) 10-20 MG tablet TAKE 1 TABLET AT BEDTIME (KEEP UPCOMING APPOINTMENT FOR REFILLS) 90 tablet 3   Fiber, Guar Gum, CHEW Chew 3 each by  mouth daily.     fluticasone  (FLONASE ) 50 MCG/ACT nasal spray USE 2 SPRAYS IN EACH NOSTRIL DAILY 16 g 11   Fluticasone -Umeclidin-Vilant (TRELEGY ELLIPTA ) 100-62.5-25 MCG/ACT AEPB Inhale 1 puff into the lungs daily. Use 1 inhalation daily 180 each 4   furosemide  (LASIX ) 20 MG tablet TAKE 1 TABLET DAILY (MUST KEEP UPCOMING OFFICE VISIT FOR FURTHER REFILLS) 90 tablet 2   guaiFENesin  (MUCINEX ) 600 MG 12 hr tablet Take 600 mg by mouth as needed.     ketoconazole (NIZORAL) 2 % cream Apply topically 2 (two) times daily.     latanoprost  (XALATAN ) 0.005 % ophthalmic solution Place 1 drop into both eyes at bedtime.     loratadine  (CLARITIN ) 10 MG tablet TAKE 1 TABLET DAILY 90 tablet 3   Melatonin 5 MG CHEW Chew 5 mg by mouth at bedtime.     RABEprazole  (ACIPHEX ) 20 MG tablet Take 1 tablet (20 mg total) by mouth daily. 90 tablet 3   rivaroxaban  (XARELTO ) 20 MG TABS tablet Take 1 tablet (20 mg total) by  mouth daily with supper. Okay to restart this medication on 02/03/2024     sacubitril -valsartan  (ENTRESTO ) 24-26 MG Take 1 tablet by mouth 2 (two) times daily. 180 tablet 2   No current facility-administered medications for this visit.    Review of Systems  Constitutional: positive for fatigue Eyes: negative Ears, nose, mouth, throat, and face: negative Respiratory: positive for cough and dyspnea on exertion Cardiovascular: negative Gastrointestinal: negative Genitourinary:negative Integument/breast: negative Hematologic/lymphatic: negative Musculoskeletal:positive for back pain Neurological: negative Behavioral/Psych: negative Endocrine: negative Allergic/Immunologic: negative  Physical Exam  MJO:jozmu, healthy, no distress, well nourished, and well developed SKIN: skin color, texture, turgor are normal, no rashes or significant lesions HEAD: Normocephalic, No masses, lesions, tenderness or abnormalities EYES: normal, PERRLA, Conjunctiva are pink and non-injected EARS: External ears normal, Canals clear OROPHARYNX:no exudate, no erythema, and lips, buccal mucosa, and tongue normal  NECK: supple, no adenopathy, no JVD LYMPH:  no palpable lymphadenopathy, no hepatosplenomegaly LUNGS: clear to auscultation , and palpation HEART: regular rate & rhythm, no murmurs, and no gallops ABDOMEN:abdomen soft, non-tender, normal bowel sounds, and no masses or organomegaly BACK: Back symmetric, no curvature., No CVA tenderness EXTREMITIES:no joint deformities, effusion, or inflammation, no edema  NEURO: alert & oriented x 3 with fluent speech, no focal motor/sensory deficits  PERFORMANCE STATUS: ECOG 1  LABORATORY DATA: Lab Results  Component Value Date   WBC 17.5 (H) 02/17/2024   HGB 14.8 02/17/2024   HCT 47.9 02/17/2024   MCV 86.9 02/17/2024   PLT 120 (L) 02/17/2024      Chemistry      Component Value Date/Time   NA 139 02/02/2024 0555   NA 140 09/13/2022 0834   NA 141  08/22/2016 0932   K 4.6 02/02/2024 0555   K 4.5 08/22/2016 0932   CL 105 02/02/2024 0555   CL 105 01/02/2012 0938   CO2 25 02/02/2024 0555   CO2 28 08/22/2016 0932   BUN 18 02/02/2024 0555   BUN 13 09/13/2022 0834   BUN 16.6 08/22/2016 0932   CREATININE 0.90 02/02/2024 0555   CREATININE 0.91 03/26/2023 1000   CREATININE 0.9 08/22/2016 0932      Component Value Date/Time   CALCIUM 9.2 02/02/2024 0555   CALCIUM 9.5 08/22/2016 0932   ALKPHOS 34 (L) 03/26/2023 1000   ALKPHOS 46 08/22/2016 0932   AST 16 03/26/2023 1000   AST 22 08/22/2016 0932   ALT 17 03/26/2023 1000   ALT  23 08/22/2016 0932   BILITOT 1.2 03/26/2023 1000   BILITOT 0.82 08/22/2016 0932       RADIOGRAPHIC STUDIES: MR BRAIN W WO CONTRAST Result Date: 02/16/2024 CLINICAL DATA:  New diagnosis adenocarcinoma of the lung EXAM: MRI HEAD WITHOUT AND WITH CONTRAST TECHNIQUE: Multiplanar, multiecho pulse sequences of the brain and surrounding structures were obtained without and with intravenous contrast. CONTRAST:  9mL GADAVIST  GADOBUTROL  1 MMOL/ML IV SOLN COMPARISON:  None Available. FINDINGS: MRI brain: The brain volume is normal. There are several foci of T2 hyperintensity in the cerebral white matter. These do not have restricted diffusion. No abnormal enhancement. There is no acute or chronic infarct. The ventricles are normal. No mass lesion. There are normal flow signals in the carotid arteries and basilar artery. No significant bone marrow signal abnormality. No significant abnormality in the paranasal sinuses or soft tissues. IMPRESSION: No evidence of metastatic disease or other significant abnormality Electronically Signed   By: Nancyann Burns M.D.   On: 02/16/2024 07:33   NM PET Image Initial (PI) Skull Base To Thigh Result Date: 01/19/2024 EXAM: PET AND CT SKULL BASE TO MID THIGH 01/19/2024 11:49:45 AM TECHNIQUE: RADIOPHARMACEUTICAL: 9.43 mCi F-18 FDG Uptake time 60 minutes. Glucose level 98 mg/dl. Blood pool SUV 1.9.  PET imaging was acquired from the base of the skull to the mid thighs. Non-contrast enhanced computed tomography was obtained for attenuation correction and anatomic localization. COMPARISON: Chest CT dated 12/30/2023. CLINICAL HISTORY: Lung nodule, greater than 8 millimeters; Lung nodules, multiple. FINDINGS: HEAD AND NECK: Bilateral common carotid atheromatous vascular calcifications. No metabolically active cervical lymphadenopathy. CHEST: Hypermetabolic left lower lobe mass potentially with adjacent conglomerate adenopathy measures 4.5 x 2.8 cm with maximum SUV 14.9, compatible with malignancy. This results in occlusion of the superior segmental bronchus and narrowing of the left lower lobe bronchus. Left lower paratracheal lymph node 1.3 cm in short axis on image 74 series 4 with maximum SUV 8.8, compatible with malignancy. A left eccentric subcarinal/paraesophageal lymph node is faintly hypermetabolic with maximum SUV 4.9. A hypermetabolic lytic lesion of the left 9th rib with surrounding sclerosis is noted with maximum SUV 4.3, compatible with metastatic lesion. Superior segment right lower lobe nodules are observed with subpleural positioning measuring up to 1.4 x 0.8 cm on image 79 series 4, max SUV 3.1, malignancy not excluded. Subsolid peripheral nodularity in the left upper lobe on image 81 series 4 measures 1.9 x 1.3 cm with maximum SUV 3.1, inflammatory versus malignant. Thoracic aortic, coronary artery, and branch vessel atheromatous vascular calcifications. Cardiomegaly and prior CABG. Small to moderate left pleural effusion with passive atelectasis. ABDOMEN AND PELVIS: Photopenic hypodense renal lesions compatible with renal cysts, not requiring further workup. Systemic atherosclerosis is present, including the aorta and iliac arteries. Prostatomegaly. No metabolically active intraperitoneal mass. No metabolically active lymphadenopathy. Physiologic activity within the gastrointestinal and  genitourinary systems. BONES AND SOFT TISSUE: A hypermetabolic lytic lesion of the left 9th rib with surrounding sclerosis is noted with maximum SUV 4.3, compatible with metastatic lesion. Degenerative bilateral hip arthropathy. Cervical, thoracic, and lumbar spondylosis. IMPRESSION: 1. Hypermetabolic left lower lobe mass with adjacent conglomerate adenopathy, compatible with malignancy, with occlusion of the superior segmental bronchus and narrowing of the left lower lobe bronchus. 2. Hypermetabolic left lower paratracheal and subcarinal/paraesophageal lymph nodes, compatible with malignancy. 3. Hypermetabolic lytic lesion of the left ninth rib, compatible with metastasis. 4. Mildly hypermetabolic right lower lobe subpleural nodules and subsolid left upper lobe nodularity, indeterminate for malignancy. 5. Small  to moderate left pleural effusion with passive atelectasis. 6. Cervical, thoracic, and lumbar spondylosis and degenerative bilateral hip arthropathy. 7. Additional chronic/incidental findings include cardiomegaly with prior coronary artery bypass grafting, systemic atherosclerosis (including coronary, aortic, carotid, and iliac calcifications), and prostatomegaly. Electronically signed by: Ryan Salvage MD 01/19/2024 01:23 PM EST RP Workstation: [id number]    ASSESSMENT: This is a very pleasant 79 years old white male with stage IV (T2a, N2, M1 B) non-small cell lung cancer presented with left lower lobe lung mass in addition to left hilar and mediastinal lymphadenopathy as well as suspicious metastasis to the left ninth rib and right middle lobe lung nodule diagnosed in January 2026.   PLAN: I had a lengthy discussion with the patient and his wife today about his current disease stage, prognosis and treatment options.  I personally independently reviewed the scan images as well as the pathology report and discussed it with the patient and his wife.  Assessment and Plan Assessment & Plan Stage  IV non-small cell lung cancer, adenocarcinoma, left lower lobe Recently diagnosed with stage IV non-small cell lung cancer, adenocarcinoma, with a primary left lower lobe mass, mediastinal and hilar lymphadenopathy, left ninth rib metastasis, and a right lower lobe nodule. No evidence of brain metastasis. Although classified as stage IV due to distant metastasis, the therapeutic approach will focus on the dominant left-sided disease, similar to stage III management, with close radiographic surveillance of the right lower lobe nodule. He is symptomatic with significant exertional dyspnea and reduced functional capacity. Surgical resection is not feasible due to disease extent. Concurrent chemoradiation is planned for local control, with subsequent consideration of targeted therapy or immunotherapy based on molecular profiling. The need for molecular testing to guide therapy was emphasized. Anticipated outcomes include disease control and potential for targeted therapy if actionable mutations are identified. - Sent biopsy tissue for molecular marker testing (EGFR, ALK, ROS1, and others) to guide targeted therapy options. - Arranged initiation of concurrent chemoradiation to the left lower lobe mass and involved mediastinal/hilar lymph nodes (chemotherapy weekly, radiation Monday through Friday for ~6.5 weeks). - Referred to radiation oncology for evaluation and planning (appointment scheduled for today). - Planned radiographic monitoring of the right lower lobe nodule, as it will not be included in the initial radiation field. - Will consider targeted therapy after chemoradiation if actionable mutations are identified; if not, will consider immunotherapy as appropriate. - Planned reassessment after chemoradiation and molecular results to determine further therapy. - Educated him and his spouse regarding diagnosis, treatment plan, rationale for molecular testing, and targeted therapy options. - Provided  anticipatory guidance regarding the 2-3 week timeline for molecular results and potential treatment delays due to weather. The patient was advised to call immediately if he has any other concerning symptoms in the interval.   The patient voices understanding of current disease status and treatment options and is in agreement with the current care plan.  All questions were answered. The patient knows to call the clinic with any problems, questions or concerns. We can certainly see the patient much sooner if necessary.  Thank you so much for allowing me to participate in the care of Gaither JAYSON Gosling. I will continue to follow up the patient with you and assist in his care. The total time spent in the appointment was 90 minutes including review of chart and various tests results, discussions about plan of care and coordination of care plan .   Disclaimer: This note was dictated with voice recognition software.  Similar sounding words can inadvertently be transcribed and may not be corrected upon review.   Sherrod MARLA Sherrod February 17, 2024, 11:27 AM        [1]  Social History Tobacco Use   Smoking status: Former    Current packs/day: 0.00    Average packs/day: 2.0 packs/day for 30.0 years (60.0 ttl pk-yrs)    Types: Cigarettes    Start date: 01/21/1962    Quit date: 01/22/1992    Years since quitting: 32.0   Smokeless tobacco: Never  Vaping Use   Vaping status: Never Used  Substance Use Topics   Alcohol use: No   Drug use: No  [2]  Allergies Allergen Reactions   Penicillins Other (See Comments)    Put pt in hospital 50 yrs ago   Ace Inhibitors Cough   Darvocet [Propoxyphene N-Acetaminophen ] Rash    Tolerates tylenol    Other Rash   Sulfa Antibiotics Rash   "

## 2024-02-17 NOTE — Progress Notes (Signed)
 Thoracic Location of Tumor / Histology: LLL Lung  Patient has been followed with pulmonary medicine for complications following covid diagnosis in 2020.  He has been followed with imaging.  He was seen recently for persistent cough in fall of 2025.  CT imaging noted persistent interstitial lung disease and an increase in the nodule originally noted in the LLL now measuring 2.7 x 3.6 cm additional bilateral pulmonary nodules measured up to 1.7 cm in the RLL.   Biopsies of LLL Lung Nodule 02/02/2024    Past/Anticipated interventions by pulmonary, if any:  Past/Anticipated interventions by cardiothoracic surgery, if any:   Past/Anticipated interventions by medical oncology, if any:  Dr. Sherrod 02/17/2024 -Arranged initiation of concurrent chemoradiation to the left lower lobe mass and involved mediastinal/hilar lymph nodes (chemotherapy weekly, radiation Monday through Friday for ~6.5 weeks). - Referred to radiation oncology for evaluation and planning (appointment scheduled for today).    Tobacco/Marijuana/Snuff/ETOH use: Former Smoker  Signs/Symptoms Weight changes, if any: He lost about 40 pounds of intentional weight. Respiratory complaints, if any: He reports SOB with increased activity. Hemoptysis, if any: He reports productive cough with clear thick stringy mucus. Pain issues, if any:  He reports a little chest pain/pressure.  SAFETY ISSUES: Prior radiation? No Pacemaker/ICD?  No Possible current pregnancy? N/a Is the patient on methotrexate? No  Current Complaints / other details:

## 2024-02-17 NOTE — Progress Notes (Signed)
 START ON PATHWAY REGIMEN - Non-Small Cell Lung     A cycle is every 7 days, concurrent with RT:     Paclitaxel      Carboplatin   **Always confirm dose/schedule in your pharmacy ordering system**  Patient Characteristics: Preoperative or Nonsurgical Candidate (Clinical Staging), Stage IIB (N2a only) or Stage III - Nonsurgical Candidate, PS = 0,1 Therapeutic Status: Preoperative or Nonsurgical Candidate (Clinical Staging) Check here if patient was staged using an edition other than AJCC Staging 9th Edition: false AJCC T Category: cT2 AJCC N Category: cN2b AJCC M Category: cM0 AJCC 9 Stage Grouping: IIIB ECOG Performance Status: 1 Intent of Therapy: Non-Curative / Palliative Intent, Discussed with Patient

## 2024-02-18 ENCOUNTER — Other Ambulatory Visit: Payer: Self-pay

## 2024-02-18 ENCOUNTER — Ambulatory Visit (HOSPITAL_COMMUNITY)

## 2024-02-19 ENCOUNTER — Other Ambulatory Visit: Payer: Self-pay

## 2024-02-20 ENCOUNTER — Inpatient Hospital Stay

## 2024-02-23 NOTE — Progress Notes (Signed)
 Pharmacist Chemotherapy Monitoring - Initial Assessment    Anticipated start date: 03/01/2024   The following has been reviewed per standard work regarding the patient's treatment regimen: The patient's diagnosis, treatment plan and drug doses, and organ/hematologic function Lab orders and baseline tests specific to treatment regimen  The treatment plan start date, drug sequencing, and pre-medications Prior authorization status  Patient's documented medication list, including drug-drug interaction screen and prescriptions for anti-emetics and supportive care specific to the treatment regimen The drug concentrations, fluid compatibility, administration routes, and timing of the medications to be used The patient's access for treatment and lifetime cumulative dose history, if applicable  The patient's medication allergies and previous infusion related reactions, if applicable   Changes made to treatment plan:  N/A  Follow up needed:  N/A   Marlee Eleanor Neighbors, RPH, 02/23/2024  7:25 AM

## 2024-02-24 ENCOUNTER — Ambulatory Visit: Admitting: Radiation Oncology

## 2024-02-24 ENCOUNTER — Encounter (HOSPITAL_COMMUNITY): Payer: Self-pay

## 2024-02-25 ENCOUNTER — Ambulatory Visit
Admission: RE | Admit: 2024-02-25 | Discharge: 2024-02-25 | Attending: Radiation Oncology | Admitting: Radiation Oncology

## 2024-02-26 ENCOUNTER — Encounter (HOSPITAL_COMMUNITY): Payer: Self-pay

## 2024-02-26 ENCOUNTER — Other Ambulatory Visit: Payer: Self-pay | Admitting: Internal Medicine

## 2024-02-26 DIAGNOSIS — C3432 Malignant neoplasm of lower lobe, left bronchus or lung: Secondary | ICD-10-CM

## 2024-03-01 ENCOUNTER — Inpatient Hospital Stay

## 2024-03-01 ENCOUNTER — Ambulatory Visit: Admitting: Radiation Oncology

## 2024-03-02 ENCOUNTER — Ambulatory Visit

## 2024-03-03 ENCOUNTER — Ambulatory Visit

## 2024-03-04 ENCOUNTER — Ambulatory Visit

## 2024-03-05 ENCOUNTER — Ambulatory Visit

## 2024-03-08 ENCOUNTER — Ambulatory Visit

## 2024-03-08 ENCOUNTER — Inpatient Hospital Stay

## 2024-03-08 ENCOUNTER — Inpatient Hospital Stay: Admitting: Internal Medicine

## 2024-03-09 ENCOUNTER — Ambulatory Visit

## 2024-03-10 ENCOUNTER — Ambulatory Visit

## 2024-03-11 ENCOUNTER — Ambulatory Visit

## 2024-03-12 ENCOUNTER — Ambulatory Visit

## 2024-03-15 ENCOUNTER — Ambulatory Visit

## 2024-03-15 ENCOUNTER — Inpatient Hospital Stay

## 2024-03-16 ENCOUNTER — Ambulatory Visit

## 2024-03-17 ENCOUNTER — Ambulatory Visit

## 2024-03-18 ENCOUNTER — Ambulatory Visit

## 2024-03-19 ENCOUNTER — Ambulatory Visit

## 2024-03-22 ENCOUNTER — Ambulatory Visit

## 2024-03-23 ENCOUNTER — Ambulatory Visit

## 2024-03-24 ENCOUNTER — Ambulatory Visit: Admitting: Hematology and Oncology

## 2024-03-24 ENCOUNTER — Inpatient Hospital Stay: Admitting: Physician Assistant

## 2024-03-24 ENCOUNTER — Inpatient Hospital Stay

## 2024-03-24 ENCOUNTER — Other Ambulatory Visit

## 2024-03-24 ENCOUNTER — Ambulatory Visit

## 2024-03-25 ENCOUNTER — Ambulatory Visit

## 2024-03-26 ENCOUNTER — Ambulatory Visit

## 2024-03-29 ENCOUNTER — Ambulatory Visit

## 2024-03-30 ENCOUNTER — Inpatient Hospital Stay

## 2024-03-30 ENCOUNTER — Ambulatory Visit

## 2024-03-31 ENCOUNTER — Ambulatory Visit

## 2024-04-01 ENCOUNTER — Ambulatory Visit

## 2024-04-02 ENCOUNTER — Ambulatory Visit

## 2024-04-05 ENCOUNTER — Ambulatory Visit

## 2024-04-06 ENCOUNTER — Inpatient Hospital Stay

## 2024-04-06 ENCOUNTER — Inpatient Hospital Stay: Admitting: Physician Assistant

## 2024-04-06 ENCOUNTER — Ambulatory Visit

## 2024-04-07 ENCOUNTER — Ambulatory Visit

## 2024-04-08 ENCOUNTER — Ambulatory Visit

## 2024-04-09 ENCOUNTER — Ambulatory Visit

## 2024-04-12 ENCOUNTER — Ambulatory Visit

## 2024-04-13 ENCOUNTER — Ambulatory Visit
